# Patient Record
Sex: Female | Born: 1945
Health system: Southern US, Community
[De-identification: ages and names within clinical notes are randomized; demographics above are authoritative.]

## PROBLEM LIST (undated history)

## (undated) ENCOUNTER — Emergency Department: Payer: PPO | Source: Home / Self Care

## (undated) DIAGNOSIS — M5030 Other cervical disc degeneration, unspecified cervical region: Secondary | ICD-10-CM

## (undated) DIAGNOSIS — M47812 Spondylosis without myelopathy or radiculopathy, cervical region: Secondary | ICD-10-CM

## (undated) DIAGNOSIS — M359 Systemic involvement of connective tissue, unspecified: Secondary | ICD-10-CM

## (undated) DIAGNOSIS — E669 Obesity, unspecified: Secondary | ICD-10-CM

## (undated) DIAGNOSIS — M351 Other overlap syndromes: Secondary | ICD-10-CM

## (undated) DIAGNOSIS — E785 Hyperlipidemia, unspecified: Secondary | ICD-10-CM

## (undated) DIAGNOSIS — M51369 Other intervertebral disc degeneration, lumbar region without mention of lumbar back pain or lower extremity pain: Secondary | ICD-10-CM

## (undated) DIAGNOSIS — N840 Polyp of corpus uteri: Secondary | ICD-10-CM

## (undated) DIAGNOSIS — Z9889 Other specified postprocedural states: Secondary | ICD-10-CM

## (undated) DIAGNOSIS — R001 Bradycardia, unspecified: Secondary | ICD-10-CM

## (undated) DIAGNOSIS — E042 Nontoxic multinodular goiter: Secondary | ICD-10-CM

## (undated) DIAGNOSIS — T7840XA Allergy, unspecified, initial encounter: Secondary | ICD-10-CM

## (undated) DIAGNOSIS — M5136 Other intervertebral disc degeneration, lumbar region: Secondary | ICD-10-CM

## (undated) DIAGNOSIS — N200 Calculus of kidney: Secondary | ICD-10-CM

## (undated) DIAGNOSIS — U071 COVID-19: Secondary | ICD-10-CM

## (undated) DIAGNOSIS — L570 Actinic keratosis: Secondary | ICD-10-CM

## (undated) DIAGNOSIS — K219 Gastro-esophageal reflux disease without esophagitis: Secondary | ICD-10-CM

## (undated) DIAGNOSIS — M4800 Spinal stenosis, site unspecified: Secondary | ICD-10-CM

## (undated) DIAGNOSIS — R112 Nausea with vomiting, unspecified: Secondary | ICD-10-CM

## (undated) HISTORY — DX: Spinal stenosis, site unspecified: M48.00

## (undated) HISTORY — DX: Allergy, unspecified, initial encounter: T78.40XA

## (undated) HISTORY — DX: Calculus of kidney: N20.0

## (undated) HISTORY — PX: OTHER SURGICAL HISTORY: SHX169

## (undated) HISTORY — DX: Obesity, unspecified: E66.9

## (undated) HISTORY — PX: DILATION AND CURETTAGE OF UTERUS: SHX78

## (undated) HISTORY — DX: Other cervical disc degeneration, unspecified cervical region: M50.30

## (undated) HISTORY — DX: Other intervertebral disc degeneration, lumbar region: M51.36

## (undated) HISTORY — DX: Bradycardia, unspecified: R00.1

## (undated) HISTORY — DX: Actinic keratosis: L57.0

## (undated) HISTORY — DX: Polyp of corpus uteri: N84.0

## (undated) HISTORY — DX: Systemic involvement of connective tissue, unspecified: M35.9

## (undated) HISTORY — DX: COVID-19: U07.1

## (undated) HISTORY — PX: APPENDECTOMY: SHX54

## (undated) HISTORY — DX: Other intervertebral disc degeneration, lumbar region without mention of lumbar back pain or lower extremity pain: M51.369

## (undated) HISTORY — PX: COLONOSCOPY: SHX174

## (undated) HISTORY — PX: HAND SURGERY: SHX662

## (undated) HISTORY — PX: TUBAL LIGATION: SHX77

## (undated) HISTORY — DX: Hyperlipidemia, unspecified: E78.5

## (undated) HISTORY — DX: Spondylosis without myelopathy or radiculopathy, cervical region: M47.812

## (undated) HISTORY — PX: UPPER GASTROINTESTINAL ENDOSCOPY: SHX188

## (undated) HISTORY — PX: TONSILLECTOMY: SUR1361

---

## 1997-11-14 ENCOUNTER — Ambulatory Visit (HOSPITAL_COMMUNITY): Admission: RE | Admit: 1997-11-14 | Discharge: 1997-11-14 | Payer: Self-pay | Admitting: *Deleted

## 2000-01-28 ENCOUNTER — Other Ambulatory Visit: Admission: RE | Admit: 2000-01-28 | Discharge: 2000-01-28 | Payer: Self-pay | Admitting: Obstetrics and Gynecology

## 2001-01-30 ENCOUNTER — Other Ambulatory Visit: Admission: RE | Admit: 2001-01-30 | Discharge: 2001-01-30 | Payer: Self-pay | Admitting: Obstetrics and Gynecology

## 2002-11-03 ENCOUNTER — Encounter: Admission: RE | Admit: 2002-11-03 | Discharge: 2002-11-03 | Payer: Self-pay | Admitting: Family Medicine

## 2002-11-03 ENCOUNTER — Encounter: Payer: Self-pay | Admitting: Family Medicine

## 2003-03-16 ENCOUNTER — Other Ambulatory Visit: Admission: RE | Admit: 2003-03-16 | Discharge: 2003-03-16 | Payer: Self-pay | Admitting: Family Medicine

## 2003-05-11 ENCOUNTER — Encounter: Payer: Self-pay | Admitting: Internal Medicine

## 2004-04-21 ENCOUNTER — Encounter: Payer: Self-pay | Admitting: Family Medicine

## 2004-05-01 ENCOUNTER — Other Ambulatory Visit: Admission: RE | Admit: 2004-05-01 | Discharge: 2004-05-01 | Payer: Self-pay | Admitting: Obstetrics and Gynecology

## 2004-05-29 ENCOUNTER — Ambulatory Visit: Payer: Self-pay | Admitting: Family Medicine

## 2004-05-30 ENCOUNTER — Ambulatory Visit: Payer: Self-pay | Admitting: Family Medicine

## 2004-09-14 ENCOUNTER — Ambulatory Visit: Payer: Self-pay | Admitting: Family Medicine

## 2004-10-03 ENCOUNTER — Ambulatory Visit: Payer: Self-pay | Admitting: Family Medicine

## 2004-10-04 ENCOUNTER — Ambulatory Visit: Payer: Self-pay | Admitting: Family Medicine

## 2004-10-05 ENCOUNTER — Ambulatory Visit: Payer: Self-pay | Admitting: Family Medicine

## 2005-01-15 ENCOUNTER — Ambulatory Visit: Payer: Self-pay | Admitting: Family Medicine

## 2005-01-18 ENCOUNTER — Ambulatory Visit: Payer: Self-pay | Admitting: Internal Medicine

## 2005-01-19 HISTORY — PX: DILATION AND CURETTAGE OF UTERUS: SHX78

## 2005-02-22 ENCOUNTER — Ambulatory Visit: Payer: Self-pay | Admitting: Family Medicine

## 2005-03-22 ENCOUNTER — Ambulatory Visit: Payer: Self-pay | Admitting: Internal Medicine

## 2005-04-02 ENCOUNTER — Ambulatory Visit: Payer: Self-pay

## 2005-04-03 ENCOUNTER — Ambulatory Visit: Payer: Self-pay | Admitting: Family Medicine

## 2005-04-25 ENCOUNTER — Ambulatory Visit: Payer: Self-pay | Admitting: Internal Medicine

## 2005-05-11 ENCOUNTER — Encounter: Admission: RE | Admit: 2005-05-11 | Discharge: 2005-05-11 | Payer: Self-pay | Admitting: Family Medicine

## 2005-06-24 ENCOUNTER — Other Ambulatory Visit: Admission: RE | Admit: 2005-06-24 | Discharge: 2005-06-24 | Payer: Self-pay | Admitting: Obstetrics and Gynecology

## 2005-12-06 ENCOUNTER — Ambulatory Visit: Payer: Self-pay | Admitting: Internal Medicine

## 2006-01-03 ENCOUNTER — Ambulatory Visit: Payer: Self-pay | Admitting: Family Medicine

## 2006-02-19 ENCOUNTER — Ambulatory Visit: Payer: Self-pay | Admitting: Family Medicine

## 2006-02-21 ENCOUNTER — Encounter: Admission: RE | Admit: 2006-02-21 | Discharge: 2006-02-21 | Payer: Self-pay | Admitting: Family Medicine

## 2006-03-31 ENCOUNTER — Ambulatory Visit: Payer: Self-pay | Admitting: Family Medicine

## 2006-06-05 ENCOUNTER — Ambulatory Visit: Payer: Self-pay | Admitting: Family Medicine

## 2006-07-22 HISTORY — PX: ENDOMETRIAL BIOPSY: SHX622

## 2006-08-07 ENCOUNTER — Other Ambulatory Visit: Admission: RE | Admit: 2006-08-07 | Discharge: 2006-08-07 | Payer: Self-pay | Admitting: Obstetrics and Gynecology

## 2006-09-09 ENCOUNTER — Ambulatory Visit: Payer: Self-pay | Admitting: Family Medicine

## 2006-09-30 ENCOUNTER — Ambulatory Visit: Payer: Self-pay | Admitting: Family Medicine

## 2006-10-17 ENCOUNTER — Ambulatory Visit: Payer: Self-pay | Admitting: Family Medicine

## 2006-10-17 LAB — CONVERTED CEMR LAB
AST: 23 units/L (ref 0–37)
Eosinophils Relative: 2.3 % (ref 0.0–5.0)
HCT: 37.1 % (ref 36.0–46.0)
Hemoglobin: 12.8 g/dL (ref 12.0–15.0)
MCHC: 34.6 g/dL (ref 30.0–36.0)
Monocytes Absolute: 0.5 10*3/uL (ref 0.2–0.7)
Neutrophils Relative %: 57.3 % (ref 43.0–77.0)
RBC: 4.02 M/uL (ref 3.87–5.11)
RDW: 12.1 % (ref 11.5–14.6)
TSH: 0.92 microintl units/mL (ref 0.35–5.50)
WBC: 4.7 10*3/uL (ref 4.5–10.5)

## 2007-01-19 ENCOUNTER — Encounter: Payer: Self-pay | Admitting: Family Medicine

## 2007-01-19 DIAGNOSIS — M1990 Unspecified osteoarthritis, unspecified site: Secondary | ICD-10-CM | POA: Insufficient documentation

## 2007-01-19 DIAGNOSIS — G5600 Carpal tunnel syndrome, unspecified upper limb: Secondary | ICD-10-CM | POA: Insufficient documentation

## 2007-01-19 DIAGNOSIS — N951 Menopausal and female climacteric states: Secondary | ICD-10-CM | POA: Insufficient documentation

## 2007-01-19 DIAGNOSIS — M359 Systemic involvement of connective tissue, unspecified: Secondary | ICD-10-CM | POA: Insufficient documentation

## 2007-01-19 DIAGNOSIS — L259 Unspecified contact dermatitis, unspecified cause: Secondary | ICD-10-CM | POA: Insufficient documentation

## 2007-01-19 DIAGNOSIS — T7840XA Allergy, unspecified, initial encounter: Secondary | ICD-10-CM | POA: Insufficient documentation

## 2007-01-19 DIAGNOSIS — K589 Irritable bowel syndrome without diarrhea: Secondary | ICD-10-CM | POA: Insufficient documentation

## 2007-01-19 DIAGNOSIS — N318 Other neuromuscular dysfunction of bladder: Secondary | ICD-10-CM | POA: Insufficient documentation

## 2007-01-19 DIAGNOSIS — E785 Hyperlipidemia, unspecified: Secondary | ICD-10-CM | POA: Insufficient documentation

## 2007-01-28 ENCOUNTER — Ambulatory Visit: Payer: Self-pay | Admitting: Family Medicine

## 2007-01-28 DIAGNOSIS — L218 Other seborrheic dermatitis: Secondary | ICD-10-CM | POA: Insufficient documentation

## 2007-02-03 ENCOUNTER — Encounter: Payer: Self-pay | Admitting: Family Medicine

## 2007-02-04 ENCOUNTER — Encounter (INDEPENDENT_AMBULATORY_CARE_PROVIDER_SITE_OTHER): Payer: Self-pay | Admitting: *Deleted

## 2007-04-01 ENCOUNTER — Encounter: Payer: Self-pay | Admitting: Family Medicine

## 2007-07-23 LAB — CONVERTED CEMR LAB: Pap Smear: NORMAL

## 2007-08-06 ENCOUNTER — Encounter: Payer: Self-pay | Admitting: Family Medicine

## 2007-08-21 ENCOUNTER — Encounter: Payer: Self-pay | Admitting: Family Medicine

## 2007-08-27 ENCOUNTER — Other Ambulatory Visit: Admission: RE | Admit: 2007-08-27 | Discharge: 2007-08-27 | Payer: Self-pay | Admitting: Obstetrics and Gynecology

## 2007-09-07 ENCOUNTER — Ambulatory Visit: Payer: Self-pay | Admitting: Internal Medicine

## 2007-09-09 ENCOUNTER — Encounter: Payer: Self-pay | Admitting: Family Medicine

## 2007-09-18 ENCOUNTER — Encounter: Payer: Self-pay | Admitting: Family Medicine

## 2007-12-28 ENCOUNTER — Encounter: Payer: Self-pay | Admitting: Family Medicine

## 2008-01-06 ENCOUNTER — Encounter: Payer: Self-pay | Admitting: Family Medicine

## 2008-01-11 ENCOUNTER — Ambulatory Visit: Payer: Self-pay | Admitting: Family Medicine

## 2008-01-11 ENCOUNTER — Telehealth: Payer: Self-pay | Admitting: Internal Medicine

## 2008-01-11 DIAGNOSIS — K648 Other hemorrhoids: Secondary | ICD-10-CM | POA: Insufficient documentation

## 2008-01-14 ENCOUNTER — Telehealth: Payer: Self-pay | Admitting: Internal Medicine

## 2008-01-29 ENCOUNTER — Ambulatory Visit: Payer: Self-pay | Admitting: Family Medicine

## 2008-02-01 LAB — CONVERTED CEMR LAB
ALT: 21 units/L (ref 0–35)
AST: 20 units/L (ref 0–37)
Albumin: 4.1 g/dL (ref 3.5–5.2)
Basophils Relative: 0.2 % (ref 0.0–1.0)
Bilirubin, Direct: 0.1 mg/dL (ref 0.0–0.3)
Calcium: 9.4 mg/dL (ref 8.4–10.5)
Chloride: 105 meq/L (ref 96–112)
Cholesterol: 213 mg/dL (ref 0–200)
Direct LDL: 144.5 mg/dL
Eosinophils Absolute: 0.2 10*3/uL (ref 0.0–0.7)
GFR calc non Af Amer: 90 mL/min
Lymphocytes Relative: 31.2 % (ref 12.0–46.0)
MCHC: 34.7 g/dL (ref 30.0–36.0)
Monocytes Relative: 12.7 % — ABNORMAL HIGH (ref 3.0–12.0)
Neutro Abs: 2.7 10*3/uL (ref 1.4–7.7)
Neutrophils Relative %: 52.6 % (ref 43.0–77.0)
Phosphorus: 3.8 mg/dL (ref 2.3–4.6)
Platelets: 225 10*3/uL (ref 150–400)
Potassium: 4 meq/L (ref 3.5–5.1)
RBC: 4.28 M/uL (ref 3.87–5.11)
RDW: 12.4 % (ref 11.5–14.6)
TSH: 1.12 microintl units/mL (ref 0.35–5.50)
Total CHOL/HDL Ratio: 4.3
Triglycerides: 108 mg/dL (ref 0–149)
VLDL: 22 mg/dL (ref 0–40)
WBC: 5.2 10*3/uL (ref 4.5–10.5)

## 2008-02-02 ENCOUNTER — Encounter: Payer: Self-pay | Admitting: Family Medicine

## 2008-02-05 ENCOUNTER — Encounter (INDEPENDENT_AMBULATORY_CARE_PROVIDER_SITE_OTHER): Payer: Self-pay | Admitting: *Deleted

## 2008-02-18 ENCOUNTER — Ambulatory Visit: Payer: Self-pay | Admitting: Family Medicine

## 2008-02-22 LAB — CONVERTED CEMR LAB
Basophils Absolute: 0 10*3/uL (ref 0.0–0.1)
Eosinophils Absolute: 0.1 10*3/uL (ref 0.0–0.7)
Lymphocytes Relative: 31.8 % (ref 12.0–46.0)
MCHC: 35 g/dL (ref 30.0–36.0)
MCV: 91.5 fL (ref 78.0–100.0)
Monocytes Absolute: 0.5 10*3/uL (ref 0.1–1.0)
Neutrophils Relative %: 52.8 % (ref 43.0–77.0)
RBC: 4.28 M/uL (ref 3.87–5.11)
WBC: 4.1 10*3/uL — ABNORMAL LOW (ref 4.5–10.5)

## 2008-04-01 ENCOUNTER — Telehealth (INDEPENDENT_AMBULATORY_CARE_PROVIDER_SITE_OTHER): Payer: Self-pay | Admitting: *Deleted

## 2008-04-26 DIAGNOSIS — Z860100 Personal history of colon polyps, unspecified: Secondary | ICD-10-CM | POA: Insufficient documentation

## 2008-04-26 DIAGNOSIS — K5730 Diverticulosis of large intestine without perforation or abscess without bleeding: Secondary | ICD-10-CM | POA: Insufficient documentation

## 2008-04-26 DIAGNOSIS — Z8619 Personal history of other infectious and parasitic diseases: Secondary | ICD-10-CM | POA: Insufficient documentation

## 2008-05-03 ENCOUNTER — Ambulatory Visit: Payer: Self-pay | Admitting: Internal Medicine

## 2008-05-03 DIAGNOSIS — K219 Gastro-esophageal reflux disease without esophagitis: Secondary | ICD-10-CM | POA: Insufficient documentation

## 2008-05-03 DIAGNOSIS — R1013 Epigastric pain: Secondary | ICD-10-CM | POA: Insufficient documentation

## 2008-05-03 DIAGNOSIS — K3189 Other diseases of stomach and duodenum: Secondary | ICD-10-CM | POA: Insufficient documentation

## 2008-05-04 ENCOUNTER — Ambulatory Visit: Payer: Self-pay | Admitting: Internal Medicine

## 2008-05-04 LAB — CONVERTED CEMR LAB: UREASE: NEGATIVE

## 2008-05-09 ENCOUNTER — Ambulatory Visit (HOSPITAL_COMMUNITY): Admission: RE | Admit: 2008-05-09 | Discharge: 2008-05-09 | Payer: Self-pay | Admitting: Internal Medicine

## 2008-05-11 ENCOUNTER — Telehealth: Payer: Self-pay | Admitting: Internal Medicine

## 2008-05-31 ENCOUNTER — Ambulatory Visit: Payer: Self-pay | Admitting: Family Medicine

## 2008-05-31 DIAGNOSIS — I498 Other specified cardiac arrhythmias: Secondary | ICD-10-CM | POA: Insufficient documentation

## 2008-06-10 ENCOUNTER — Ambulatory Visit: Payer: Self-pay | Admitting: Internal Medicine

## 2008-12-30 ENCOUNTER — Ambulatory Visit: Payer: Self-pay | Admitting: Family Medicine

## 2009-01-11 ENCOUNTER — Encounter: Payer: Self-pay | Admitting: Family Medicine

## 2009-02-07 ENCOUNTER — Encounter: Payer: Self-pay | Admitting: Family Medicine

## 2009-02-10 ENCOUNTER — Encounter (INDEPENDENT_AMBULATORY_CARE_PROVIDER_SITE_OTHER): Payer: Self-pay | Admitting: *Deleted

## 2009-02-22 ENCOUNTER — Observation Stay (HOSPITAL_COMMUNITY): Admission: EM | Admit: 2009-02-22 | Discharge: 2009-02-22 | Payer: Self-pay | Admitting: Emergency Medicine

## 2009-05-24 ENCOUNTER — Encounter: Payer: Self-pay | Admitting: Family Medicine

## 2009-07-11 ENCOUNTER — Ambulatory Visit: Payer: Self-pay | Admitting: Family Medicine

## 2009-07-13 ENCOUNTER — Telehealth: Payer: Self-pay | Admitting: Family Medicine

## 2009-08-15 ENCOUNTER — Other Ambulatory Visit: Admission: RE | Admit: 2009-08-15 | Discharge: 2009-08-15 | Payer: Self-pay | Admitting: Obstetrics and Gynecology

## 2009-10-04 ENCOUNTER — Telehealth: Payer: Self-pay | Admitting: Internal Medicine

## 2010-01-08 ENCOUNTER — Encounter: Payer: Self-pay | Admitting: Family Medicine

## 2010-03-28 ENCOUNTER — Encounter (INDEPENDENT_AMBULATORY_CARE_PROVIDER_SITE_OTHER): Payer: Self-pay | Admitting: *Deleted

## 2010-04-06 ENCOUNTER — Telehealth: Payer: Self-pay | Admitting: Internal Medicine

## 2010-04-18 ENCOUNTER — Ambulatory Visit: Payer: Self-pay | Admitting: Family Medicine

## 2010-04-18 DIAGNOSIS — E039 Hypothyroidism, unspecified: Secondary | ICD-10-CM | POA: Insufficient documentation

## 2010-04-19 ENCOUNTER — Telehealth: Payer: Self-pay | Admitting: Family Medicine

## 2010-05-02 ENCOUNTER — Ambulatory Visit: Payer: Self-pay | Admitting: Family Medicine

## 2010-05-06 LAB — CONVERTED CEMR LAB
Free T4: 0.41 ng/dL — ABNORMAL LOW (ref 0.60–1.60)
TSH: 0.16 microintl units/mL — ABNORMAL LOW (ref 0.35–5.50)

## 2010-05-11 ENCOUNTER — Ambulatory Visit: Payer: Self-pay | Admitting: Family Medicine

## 2010-05-11 DIAGNOSIS — R892 Abnormal level of other drugs, medicaments and biological substances in specimens from other organs, systems and tissues: Secondary | ICD-10-CM | POA: Insufficient documentation

## 2010-05-22 ENCOUNTER — Encounter: Payer: Self-pay | Admitting: Family Medicine

## 2010-05-24 ENCOUNTER — Encounter: Payer: Self-pay | Admitting: Family Medicine

## 2010-06-08 ENCOUNTER — Telehealth: Payer: Self-pay | Admitting: Family Medicine

## 2010-06-08 DIAGNOSIS — H9319 Tinnitus, unspecified ear: Secondary | ICD-10-CM | POA: Insufficient documentation

## 2010-06-13 ENCOUNTER — Encounter: Payer: Self-pay | Admitting: Family Medicine

## 2010-06-28 ENCOUNTER — Encounter: Payer: Self-pay | Admitting: Family Medicine

## 2010-06-28 LAB — HM MAMMOGRAPHY: HM Mammogram: NORMAL

## 2010-07-02 ENCOUNTER — Encounter (INDEPENDENT_AMBULATORY_CARE_PROVIDER_SITE_OTHER): Payer: Self-pay | Admitting: *Deleted

## 2010-07-02 ENCOUNTER — Encounter: Payer: Self-pay | Admitting: Family Medicine

## 2010-08-01 ENCOUNTER — Ambulatory Visit
Admission: RE | Admit: 2010-08-01 | Discharge: 2010-08-01 | Payer: Self-pay | Source: Home / Self Care | Attending: Family Medicine | Admitting: Family Medicine

## 2010-08-09 ENCOUNTER — Other Ambulatory Visit: Payer: Self-pay | Admitting: Family Medicine

## 2010-08-09 ENCOUNTER — Ambulatory Visit
Admission: RE | Admit: 2010-08-09 | Discharge: 2010-08-09 | Payer: Self-pay | Source: Home / Self Care | Attending: Family Medicine | Admitting: Family Medicine

## 2010-08-09 LAB — LIPID PANEL
Cholesterol: 224 mg/dL — ABNORMAL HIGH (ref 0–200)
HDL: 47.4 mg/dL (ref >39.00)
Total CHOL/HDL Ratio: 5
Triglycerides: 66 mg/dL (ref 0.0–149.0)
VLDL: 13.2 mg/dL (ref 0.0–40.0)

## 2010-08-09 LAB — LDL CHOLESTEROL, DIRECT: Direct LDL: 166.4 mg/dL

## 2010-08-09 LAB — ALT: ALT: 17 U/L (ref 0–35)

## 2010-08-09 LAB — AST: AST: 19 U/L (ref 0–37)

## 2010-08-15 ENCOUNTER — Encounter: Payer: Self-pay | Admitting: Family Medicine

## 2010-08-15 ENCOUNTER — Ambulatory Visit
Admission: RE | Admit: 2010-08-15 | Discharge: 2010-08-15 | Payer: Self-pay | Source: Home / Self Care | Attending: Family Medicine | Admitting: Family Medicine

## 2010-08-21 NOTE — Letter (Signed)
Summary: Colonoscopy Letter  [facility]  [address] [place], [place] [address]   Phone: [phone]  Fax: [phone]      March 28, 2010 MRN: [id number]   Morgan [name] [address] [place], [place]  [address]   Dear [name],   According to your medical record, it is time for you to schedule a Colonoscopy. The American Cancer Society recommends this procedure as a method to detect early colon cancer. Patients with a family history of colon cancer, or a personal history of colon polyps or inflammatory bowel disease are at increased risk.  This letter has beeen generated based on the recommendations made at the time of your procedure. If you feel that in your particular situation this may no longer apply, please contact our office.  Please call our office at [phone] to schedule this appointment or to update your records at your earliest convenience.  Thank you for cooperating with [place] to provide you with the very best care possible.   Sincerely,  Hedwig Morton. Juanda Chance, M.D.  [facility] Gastroenterology Division [phone]

## 2010-08-21 NOTE — Consult Note (Addendum)
Summary: [facility]= endocrine  [facility]   Imported By: Maryln Gottron 05/28/2010 15:46:44  _____________________________________________________________________  External Attachment:    Type:   Image     Comment:   External Document

## 2010-08-21 NOTE — Progress Notes (Signed)
Summary: loud noise in ears   Phone Note Call from Patient Call back at Home Phone [phone]   Caller: Patient Call For: Judith Part MD Summary of Call: Patient states that she has been having a very unusual noise when she is lying down at night and there are no other noises around. She explained it like a ???piolet light. She says that it is not a ringing in her ears, but it is loud enough that it keeps her from sleeping soundly because she is hearing this nois, she doesn't hear it at all during the day. She is asking if she should be seen by an ENT and if so who you reccomend. Please advise.  Initial call taken by: Melody Comas,  June 08, 2010 9:24 AM  Follow-up for Phone Call        yes I do recommend ENT is likely a condition called tinnitus -- and sometimes is treatable , sometimes not I will do ref for [facility] Follow-up by: Judith Part MD,  June 08, 2010 11:36 AM  Additional Follow-up for Phone Call Additional follow up Details #1::        Patient notified as instructed by telephone. Pt said she would like to go to ENT in [place]; whoever Dr Milinda Antis would recommend.Pt  will wait to hear from [facility] and pt can be reached at [phone].Lewanda Rife LPN  June 08, 2010 12:04 PM   New Problems: TINNITUS (ICD-388.30)   New Problems: TINNITUS (ICD-388.30)

## 2010-08-21 NOTE — Progress Notes (Signed)
Summary: ? about taking magnesium   Phone Note Call from Patient Call back at Home Phone [phone]   Caller: Patient Call For: DR.Shylynn Bruning Summary of Call: patient having some trouble with constipation she would like to know if it is ok to take magnesium 200mg  tabs once a day to help her have a BM. ok to leave a message on her machine if she is not there.  Initial call taken by: Harlow Mares CMA Duncan Dull),  October 04, 2009 8:44 AM  Follow-up for Phone Call        DR.Murtaza Shell PLEASE ADVISE  Follow-up by: Laureen Ochs LPN,  October 04, 2009 9:17 AM  Additional Follow-up for Phone Call Additional follow up Details #1::        Yes, she may take 400mg  Mg2+  ( 2 tablets) daily as needed for constipation Additional Follow-up by: Hart Carwin MD,  October 04, 2009 4:26 PM    Additional Follow-up for Phone Call Additional follow up Details #2::    .Message left for pt. with above MD instructions. Pt. instructed to call back as needed.  Follow-up by: Laureen Ochs LPN,  October 04, 2009 4:38 PM

## 2010-08-21 NOTE — Progress Notes (Signed)
Summary: Update on pt's meds  Phone Note Call from Patient   Caller: Patient Call For: Judith Part MD Summary of Call: Pt was to call with medications she is taking that she did not know the name of at her last visit. 1) Ultra  probiotic balance 100 billion cfu- taking one tablet three times a week. 2) Betaine plus taking one tablet before each meal and 3) Niatain taking one tablet daily. 4) ASA 81mg  taking one tablet daily.  I will add these meds to pt's med list. Initial call taken by: Lewanda Rife LPN,  April 19, 2010 10:59 AM  Follow-up for Phone Call        thanks - I will try to look into what these are  Follow-up by: Judith Part MD,  April 19, 2010 12:17 PM  Additional Follow-up for Phone Call Additional follow up Details #1::        the probiotic is probably fine  iodine is probably for thyroid I don't know what niatain is - have her ask her herbalist and will disc all at her f/u please add these to her med list Additional Follow-up by: Judith Part MD,  April 20, 2010 8:13 AM    Additional Follow-up for Phone Call Additional follow up Details #2::    Patient notified as instructed by telephone. Pt will get info from herbalist.Rena [facility] LPN  April 20, 2010 10:51 AM   New/Updated Medications: ARMOUR THYROID 120 MG TABS (THYROID) Take 1 tablet by mouth once a day

## 2010-08-21 NOTE — Assessment & Plan Note (Signed)
Summary: DULL ACHE FROM BREAST TO STOMACH/CE   Vital Signs:  Patient profile:   65 year old female Height:      65.25 inches Weight:      183 pounds BMI:     30.33 Temp:     97.9 degrees F oral Pulse rate:   64 / minute Pulse rhythm:   regular BP sitting:   100 / 60  (left arm) Cuff size:   regular  Vitals Entered By: Lewanda Rife LPN (April 18, 2010 3:23 PM) CC: dull ache in upper abdomen and lower stomach bothers her   History of Present Illness: had a dull ache  in epigastric area all night long  this am felt poorly - tired - went back to bed  eventually did feel better now feels like a rock in stomach -- like overly full and indigestion it was constant  no burning  has always had gas and belching - no worse than usual  last night ate roast brown rice and gravy / beans and corn - small portion  no bread     has hx of GERD/ functional dyspepsia and HH in past  last EGD was in 09 showed minimal gastritis  04 colonosc with tics and polyps- due for one   sees Dr Juanda Chance- is due for colonosc   used to be on protonix - has not taken for a long time   is taking a probiotic - ? which one  does take baby aspirin  is taking 3 drugs / herbal - to help digestion and hair  taking these for belching -- and thinks they are helping   sees herbal pharmacist / np -- Tammy Whirl - at [facility]   is on armour thyroid -- for ? borderline thyroid      Allergies: 1)  Codeine 2)  Penicillin 3)  Vicodin 4)  * Beconase 5)  * Rhinocort 6)  Claritin 7)  * Climara Patch  Past History:  Past Surgical History: Last updated: 01-Jun-2008 Tonsillectomy Dexa- normal (12/1998)-   ok (05/2006) Exercise stress test- neg (05/2000) Appendectomy Tubal ligation Colonoscopy- polyps, diverticulitis (04/2003) D & C- fibroids Abd [place]- neg (09/2004) D & C- endometrial polyp (01/2005) Stress myoview- neg EF 69% (03/2005) NCV/EMG- neg (03/2006) Endo biopsy- neg per pt (2008) Abd [place]- neg  (05/1997) EGD nl with [facility] 10/09  Family History: Last updated: 06-01-2008 Father: CAD, MI (died at 36) Mother:  Siblings:  GM DM cousins- DM No FH of Colon Cancer:  Social History: Last updated: 01/29/2008 Marital Status: Married Children:  Occupation:  non smoker no alcohol   Risk Factors: Smoking Status: never (01/19/2007)  Past Medical History: Hyperlipidemia Osteoarthritis- neck connective tissue dz deg disk/spinal stenosis-- CS , LS  obesity  endometrial ? polyp-- not resolved with Dand C-- following  bradycardia   hand sx-- Dr Teressa Senter  GI--Dr Juanda Chance  rheum-- Dr Janetta Hora  cardiol Tenny Craw  sees ? herbalist at [facility] (nurse practitioner)  Review of Systems General:  Denies chills, fatigue, fever, and malaise. Eyes:  Denies blurring and eye irritation. CV:  Denies chest pain or discomfort, palpitations, and shortness of breath with exertion. Resp:  Denies cough, shortness of breath, and wheezing. GI:  Complains of abdominal pain, gas, and indigestion; denies bloody stools, change in bowel habits, nausea, and vomiting. GU:  Denies abnormal vaginal bleeding, discharge, dysuria, and urinary frequency. MS:  Denies joint pain. Derm:  Denies itching, lesion(s), poor wound healing, and rash. Neuro:  Denies  numbness and tingling. Psych:  some stress . Endo:  Denies cold intolerance, excessive thirst, excessive urination, and heat intolerance. Heme:  Denies abnormal bruising and bleeding.  Physical Exam  General:  overweight but generally well appearing  Head:  normocephalic, atraumatic, and no abnormalities observed.   Eyes:  vision grossly intact, pupils equal, pupils round, and pupils reactive to light.  no conjunctival pallor, injection or icterus  Mouth:  pharynx pink and moist.   Neck:  supple with full rom and no masses or thyromegally, no JVD or carotid bruit  Chest Wall:  No deformities, masses, or tenderness noted. Lungs:  Normal respiratory effort,  chest expands symmetrically. Lungs are clear to auscultation, no crackles or wheezes. Heart:  Normal rate and regular rhythm. S1 and S2 normal without gallop, murmur, click, rub or other extra sounds. Abdomen:  mild epigastric tenderness without rebound or gaurding nl bs 4 Q Msk:  No deformity or scoliosis noted of thoracic or lumbar spine.   Extremities:  No clubbing, cyanosis, edema, or deformity noted with normal full range of motion of all joints.   Neurologic:  sensation intact to light touch, gait normal, and DTRs symmetrical and normal.   Skin:  Intact without suspicious lesions or rashes Cervical Nodes:  No lymphadenopathy noted Inguinal Nodes:  No significant adenopathy Psych:  normal affect, talkative and pleasant    Impression & Recommendations:  Problem # 1:  DYSPEPSIA (ICD-536.8)  I recommend re starting protonix for now  keep working on wt loss send me the names of herbs you are taking  f/u after lab in 2 weeks  in addn pt thinks she had lead poisoining? -- saw herbalist and had chealation tx .... really unsure  will bring labs to next visit   Orders: Prescription Created Electronically [phone])  Problem # 2:  HYPOTHYROIDISM (ICD-244.9) Assessment: New  pt is on armour thyroid but is unsure if she is hypothyroid  will hold it 2 wk and then do thyroid prof and f/u  Her updated medication list for this problem includes:    Armour Thyroid 120 Mg Tabs (Thyroid) .Marland Kitchen... Take 1 tablet by mouth once a day  Orders: Prescription Created Electronically [id number])  Complete Medication List: 1)  Adult Aspirin Low Strength 81 Mg Tbdp (Aspirin) .... Take one by mouth as directed 2)  Acetaminophen 325 Mg Tabs (Acetaminophen) 3)  Progesterone 200mg   .... One by mouth daily 4)  Armour Thyroid 120 Mg Tabs (Thyroid) .... Take 1 tablet by mouth once a day 5)  Vivelle-dot 0.1 Mg/24hr Pttw (Estradiol) .... Change patch twice weekly (alternating with .075 patch) 6)  Vivelle-dot 0.075  Mg/24hr Pttw (Estradiol) .... Change patch twice weekly alternating with the .1 patch. 7)  Liquid Vitamin D 10000iu  .... One ml by mouth once weekly 8)  Magnesium ?mg  .... Take 1 tablet by mouth once a day 9)  Protonix 40 Mg Tbec (Pantoprazole sodium) .Marland Kitchen.. 1 by mouth once daily 10)  Ultra Probiotic Balance 100 Billion Cfu  .... One tablet three times a week 11)  Betaine Plus  .... One tablet by mouth before each meal. 12)  Niatain  .... Take 1 tablet by mouth once a day  Patient Instructions: 1)  call Dr Juanda Chance to see if colonoscopy is due -- depends if the polyps were adenomatous or hyperplastic  2)  I think you need to know what she recommends before you decide what to do  3)  remember that herbs and vitamins are  drugs that need to be monitored  4)  please call me back with the names of the herbs and vitamins that you take  5)  the current recommendation for calcium intake is 1200-1500 mg daily with 1000 IU of vitamin D  6)  hold your armour thyroid  7)  schedule labs for thyroid profile in 2 weeks tsh/ free T4 / T3 free/ T3 uptake  8)  then follow up for 30 minute visit  9)  start back on protonix -- I will send it to your pharmacy  Prescriptions: PROTONIX 40 MG TBEC (PANTOPRAZOLE SODIUM) 1 by mouth once daily  #30 x 3   Entered and Authorized by:   Judith Part MD   Signed by:   Judith Part MD on 04/18/2010   Method used:   Electronically to        [facility]* (retail)       [address]       [place], [place]  [address]       Ph: [phone]       Fax: [phone]   RxID:   [id number]   Current Allergies (reviewed today): CODEINE PENICILLIN VICODIN * BECONASE * RHINOCORT [place] PATCH

## 2010-08-21 NOTE — Assessment & Plan Note (Signed)
Summary: F/U DLO   Vital Signs:  Patient profile:   65 year old female Height:      65.25 inches Weight:      183.75 pounds BMI:     30.45 Temp:     98.2 degrees F oral Pulse rate:   60 / minute Pulse rhythm:   regular BP sitting:   118 / 74  (left arm) Cuff size:   regular  Vitals Entered By: Lewanda Rife LPN (Jun 09, 2010 12:04 PM) CC: follow-up visit   History of Present Illness: here for f/u of gastritis and thyroid problem  on protonix-- and abd symptoms are gone  wants to stay on this   thyroid labs came back abn with low tsh and low T4 and T3 with nl T3 uptake  no goiter that she knows of - cannot feel any  gmother had a goiter    herbs on probiotic -- wanted to make her hair  on betadyne plus for thyroid   has appt to see derm for hair loss in july   had heavy metal test high lead - and had iv chealation   Allergies: 1)  Codeine 2)  Penicillin 3)  Vicodin 4)  * Beconase 5)  * Rhinocort 6)  Claritin 7)  * Climara Patch  Past History:  Past Medical History: Last updated: 04/18/2010 Hyperlipidemia Osteoarthritis- neck connective tissue dz deg disk/spinal stenosis-- CS , LS  obesity  endometrial ? polyp-- not resolved with Dand C-- following  bradycardia   hand sx-- Dr Teressa Senter  GI--Dr Juanda Chance  rheum-- Dr Janetta Hora  cardiol Tenny Craw  sees ? herbalist at [facility] (nurse practitioner)  Past Surgical History: Last updated: 05/31/2008 Tonsillectomy Dexa- normal (12/1998)-   ok (05/2006) Exercise stress test- neg (05/2000) Appendectomy Tubal ligation Colonoscopy- polyps, diverticulitis (04/2003) D & C- fibroids Abd [place]- neg (09/2004) D & C- endometrial polyp (01/2005) Stress myoview- neg EF 69% (03/2005) NCV/EMG- neg (03/2006) Endo biopsy- neg per pt (2008) Abd [place]- neg (05/1997) EGD nl with [facility] 10/09  Family History: Last updated: 06-09-2010 Father: CAD, MI (died at 70) Mother:  Siblings:  GM DM cousins- DM No FH of Colon  Cancer: gm with goiter  grandson dying of a very rare cancer at age 62  Social History: Last updated: 01/29/2008 Marital Status: Married Children:  Occupation:  non smoker no alcohol   Risk Factors: Smoking Status: never (01/19/2007)  Family History: Father: CAD, MI (died at 44) Mother:  Siblings:  GM DM cousins- DM No FH of Colon Cancer: gm with goiter  grandson dying of a very rare cancer at age 55  Review of Systems General:  Complains of fatigue; denies loss of appetite and malaise; for the most part feels good . Eyes:  Denies blurring and eye pain. ENT:  Denies sinus pressure and sore throat. CV:  Denies chest pain or discomfort, palpitations, and shortness of breath with exertion. Resp:  Denies cough, shortness of breath, and wheezing. GI:  Denies abdominal pain, change in bowel habits, constipation, indigestion, nausea, and vomiting. GU:  Denies hematuria and urinary frequency. MS:  Denies joint pain, muscle aches, and cramps. Derm:  Complains of hair loss; denies itching, lesion(s), poor wound healing, and rash. Neuro:  Denies headaches, numbness, tingling, and weakness. Psych:  mood is ok . Endo:  Denies cold intolerance, excessive thirst, excessive urination, heat intolerance, and polyuria. Heme:  Denies abnormal bruising, bleeding, and enlarge lymph nodes.  Physical Exam  General:  overweight but generally  well appearing  Head:  normocephalic, atraumatic, and no abnormalities observed.   Eyes:  vision grossly intact, pupils equal, pupils round, and pupils reactive to light.  no conjunctival pallor, injection or icterus  Nose:  no nasal discharge.   Mouth:  pharynx pink and moist.   Neck:  supple with full rom and no masses or thyromegally, no JVD or carotid bruit  Chest Wall:  No deformities, masses, or tenderness noted. Lungs:  Normal respiratory effort, chest expands symmetrically. Lungs are clear to auscultation, no crackles or wheezes. Heart:  Normal  rate and regular rhythm. S1 and S2 normal without gallop, murmur, click, rub or other extra sounds. Abdomen:  Bowel sounds positive,abdomen soft and non-tender without masses, organomegaly or hernias noted. Msk:  No deformity or scoliosis noted of thoracic or lumbar spine.  no acute joint changes  Pulses:  R and L carotid,radial,femoral,dorsalis pedis and posterior tibial pulses are full and equal bilaterally Extremities:  No clubbing, cyanosis, edema, or deformity noted with normal full range of motion of all joints.   Neurologic:  sensation intact to light touch, gait normal, and DTRs symmetrical and normal.   Skin:  Intact without suspicious lesions or rashes Cervical Nodes:  No lymphadenopathy noted Inguinal Nodes:  No significant adenopathy Psych:  normal affect, talkative and pleasant    Impression & Recommendations:  Problem # 1:  HYPOTHYROIDISM (ICD-244.9) Assessment Deteriorated now with low tsh but also low T4 and T3 -- (stopped armour thyroid 2 wk ago) pt concerned mainly with hair loss also has high chol  will stop herbs ref to endo  pt also has questions about heavy metals -- will take time to review her labs from herbalist  Her updated medication list for this problem includes:    Armour Thyroid 120 Mg Tabs (Thyroid) .Marland Kitchen... Take 1 tablet by mouth once a day  Orders: Endocrinology Referral (Endocrine)  Problem # 2:  GASTRITIS (ICD-535.50) Assessment: Improved resolved on protonix  I rec she continue this and keep working on healthy diet and wt loss  Her updated medication list for this problem includes:    Protonix 40 Mg Tbec (Pantoprazole sodium) .Marland Kitchen... 1 by mouth once daily  Problem # 3:  HYPERLIPIDEMIA (ICD-272.4) Assessment: Unchanged  will re check this in 3 mo after thyroid is checked out  adv to continue low dose niacin if she wants to  rev low sat fat diet and exercise lab 3 mo and t/u  Labs Reviewed: SGOT: 20 (01/29/2008)   SGPT: 21 (01/29/2008)    HDL:50.0 (01/29/2008), 55.2 (10/17/2006)  LDL:DEL (01/29/2008), DEL (10/17/2006)  Chol:213 (01/29/2008), 230 (10/17/2006)  Trig:108 (01/29/2008), 151 (10/17/2006)  Problem # 4:  NONSPECIFIC ABNORMAL TOXICOLOGICAL FINDINGS (ICD-796.0) Assessment: New per pt - high heavy metals - and underwent chealation for lead  will scan her results and review ? any lead exposure   Complete Medication List: 1)  Adult Aspirin Low Strength 81 Mg Tbdp (Aspirin) .... Take one by mouth as directed 2)  Progesterone 200mg   .... One by mouth daily 3)  Armour Thyroid 120 Mg Tabs (Thyroid) .... Take 1 tablet by mouth once a day 4)  Vivelle-dot 0.1 Mg/24hr Pttw (Estradiol) .... Change patch twice weekly (alternating with .075 patch) 5)  Vivelle-dot 0.075 Mg/24hr Pttw (Estradiol) .... Change patch twice weekly alternating with the .1 patch. 6)  Liquid Vitamin D 10000iu  .... One ml by mouth once weekly 7)  Magnesium ?mg  .... Take 1 tablet by mouth once a day 8)  Protonix 40 Mg Tbec (Pantoprazole sodium) .Marland Kitchen.. 1 by mouth once daily 9)  Ultra Probiotic Balance 100 Billion Cfu  .... One tablet three times a week 10)  Betaine Plus  .... One tablet by mouth before each meal. 11)  Niatain  .... Take 1 tablet by mouth once a day  Patient Instructions: 1)  stop the probiotic unless it is helping you 2)  the niacin may help cholesterol  3)  stop your current vitaminD and take 1000 international units per day to keep bones healthy  4)  also you need to take calcium 1200-1500 mg per day  5)  we will do endocrinology referral at check out  6)  schedule fasting labs in 3 months lipid/ast/alt 272 and then follow up    Orders Added: 1)  Endocrinology Referral [Endocrine] 2)  Est. Patient Level IV [29562]    Current Allergies (reviewed today): CODEINE PENICILLIN VICODIN * BECONASE * RHINOCORT CLARITIN [facility] PATCH

## 2010-08-21 NOTE — Assessment & Plan Note (Signed)
Summary: CPX/CLE   Vital Signs:  Patient Profile:   65 Years Old Female Height:     65.25 inches Weight:      197 pounds Temp:     97.9 degrees F oral Pulse rate:   60 / minute Pulse rhythm:   regular BP sitting:   128 / 82  (left arm) Cuff size:   large  Vitals Entered By: Sydell Axon (January 29, 2008 10:57 AM)                 Chief Complaint:  30 minute checkup.  History of Present Illness: has a lot of things going on now  CT disease is under control with plaquenil-- is on a small dose  needs sx for trigger finger has chronic joint pain and back pain (with disc disease and spinal stenosis LS and cervical)-- sees DR Jeral Fruit  a lot of gas lately -- a lot of burping -- no acid or bitter taste  worse when she lies down at night pre - dated the plaquenil   wt is up 4 lb from last year has not been exercising-- too much pain  is getting shorter --used to be 5 ft 6 inches in past   is on femhrt from gyn-- is helping her with sleeping-- still having hot flashes gyn visit was in january  tried to get off them 6 mo  had polyp -- could not get it all with Dand C -- said if grows will get hyst   colonosc 2004- due in 7 years has had  a rectal fissure since then-- saw Billie-- using suppositories  had mam set up this month  has a skin tag-- on rectum-- also took antibiotic is taking miralax on a schedule -- to keep stools soft   is overdue for cholesterol check has not changed her diet for high cholesterol last LDL  over 150            Current Allergies (reviewed today): CODEINE PENICILLIN VICODIN * BECONASE * RHINOCORT CLARITIN * CLIMARA PATCH  Past Medical History:    Hyperlipidemia    Osteoarthritis- neck    connective tissue dz    deg disk/spinal stenosis-- CS , LS     obesity     endometrial ? polyp-- not resolved with Dand C-- following   Past Surgical History:    Reviewed history from 01/19/2007 and no changes required:       Dexa- normal  (12/1998)-   ok (05/2006)       Exercise stress test- neg (05/2000)       Appendectomy       Tubal ligation       Colonoscopy- polyps, diverticulitis (04/2003)       D & C- fibroids       Abd [place]- neg (09/2004)       D & C- endometrial polyp (01/2005)       Stress myoview- neg EF 69% (03/2005)       NCV/EMG- neg (03/2006)       Endo biopsy- neg per pt (2008)       Abd [place]- neg (05/1997)   Family History:    Reviewed history from 01/28/2007 and no changes required:       Father: CAD, MI       Mother:        Siblings:        GM DM       cousins- DM  Social History:  Marital Status: Married    Children:     Occupation:     non smoker    no alcohol    Risk Factors:  PAP Smear History:     Date of Last PAP Smear:  07/23/2007    Results:  normal    Review of Systems  General      Complains of fatigue.      Denies loss of appetite, malaise, and weight loss.  Eyes      Denies blurring and eye pain.  ENT      Denies hoarseness, sinus pressure, and sore throat.  CV      Denies chest pain or discomfort, palpitations, shortness of breath with exertion, and swelling of feet.  Resp      Denies cough and shortness of breath.  GI      Complains of gas.      Denies bloody stools, change in bowel habits, nausea, and vomiting.  GU      Denies abnormal vaginal bleeding and dysuria.  MS      Complains of joint pain and stiffness.      Denies joint redness and joint swelling.  Derm      Denies changes in color of skin, itching, and rash.  Neuro      Denies numbness and tingling.  Psych      gets down about her health problems- but overall ok   Endo      Denies excessive thirst and excessive urination.  Heme      Denies abnormal bruising and bleeding.   Physical Exam  General:     .overw  Head:     normocephalic, atraumatic, and no abnormalities observed.   Eyes:     vision grossly intact, pupils equal, pupils round, and pupils reactive to light.     Ears:     R ear normal and L ear normal.   Nose:     no nasal discharge.   Mouth:     pharynx pink and moist.   Neck:     supple with full rom and no masses or thyromegally, no JVD or carotid bruit  Chest Wall:     No deformities, masses, or tenderness noted. Lungs:     Normal respiratory effort, chest expands symmetrically. Lungs are clear to auscultation, no crackles or wheezes. Heart:     Normal rate and regular rhythm. S1 and S2 normal without gallop, murmur, click, rub or other extra sounds. Abdomen:     Bowel sounds positive,abdomen soft and non-tender without masses, organomegaly or hernias noted.  no renal bruits  Rectal:     anal skin tag and ext hem (non thrombosed )- non tender, nl tone with no bleeding  Msk:     No deformity or scoliosis noted of thoracic or lumbar spine.  no acute joint changes  Pulses:     R and L carotid,radial,femoral,dorsalis pedis and posterior tibial pulses are full and equal bilaterally Extremities:     No clubbing, cyanosis, edema, or deformity noted with normal full range of motion of all joints.   Neurologic:     sensation intact to light touch, gait normal, and DTRs symmetrical and normal.   Skin:     Intact without suspicious lesions or rashes Cervical Nodes:     No lymphadenopathy noted Axillary Nodes:     No palpable lymphadenopathy Inguinal Nodes:     No significant adenopathy Psych:     nl affect, slt anxious  Impression & Recommendations:  Problem # 1:  HEALTH MAINTENANCE EXAM (ICD-V70.0) Assessment: Comment Only reviewed health habits including diet, exercise and skin cancer prevention reviewed health maintenance list and family history would benefit from wt loss- disc health habits in general Orders: Venipuncture (59563) TLB-Lipid Panel (80061-LIPID) TLB-Hepatic/Liver Function Pnl (80076-HEPATIC) TLB-Renal Function Panel (80069-RENAL) TLB-CBC Platelet - w/Differential (85025-CBCD) TLB-TSH (Thyroid Stimulating  Hormone) (84443-TSH)   Problem # 2:  ANAL OR RECTAL PAIN (OVF-643.32) Assessment: Improved anal skin tag and small ext hem adv to keep stools soft and practice good hygiene (can continue tucks pads) adv to update me if inc pain or swelling or no imp in 2 weeks due for colonosc in 2011  Problem # 3:  Hx of CONNECTIVE TISSUE DISEASE (ICD-710.9) Assessment: Unchanged doing fairly well with low dose plaquenil will f/u with rheum as planned   Problem # 4:  HYPERLIPIDEMIA (ICD-272.4) Assessment: Unchanged disc diet in detail-- needs to watch sat fats labs today-- then will give some time for diet change-- if not imp may need to disc statin  also disc exercise Orders: Venipuncture (95188) TLB-Lipid Panel (80061-LIPID) TLB-Hepatic/Liver Function Pnl (80076-HEPATIC)  Labs Reviewed: Chol: 230 (10/17/2006)   HDL: 55.2 (10/17/2006)   LDL: DEL (10/17/2006)   TG: 151 (10/17/2006) SGOT: 23 (10/17/2006)   SGPT: 17 (10/17/2006)   Problem # 5:  Hx of POSTMENOPAUSAL STATUS (ICD-627.2) with severe vasomotor symptoms pt is aware of risks of HRT-- but continues them for symptoms- through her gyn Her updated medication list for this problem includes:    Femhrt 1/5 1-5 Mg-mcg Tabs (Norethindrone-eth estradiol) .Marland Kitchen... Take 1 tablet by mouth once a day   Complete Medication List: 1)  Adult Aspirin Low Strength 81 Mg Tbdp (Aspirin) .... Take one by mouth as directed 2)  Fish Oil 1200 Mg Caps (Omega-3 fatty acids) .... Take one by mouth daily 3)  Vitamin D 1000 Unit Tabs (Cholecalciferol) .... Take one by mouth daily 4)  Plaquenil 200 Mg Tabs (Hydroxychloroquine sulfate) .... Take 1 tablet by mouth once a day 5)  Femhrt 1/5 1-5 Mg-mcg Tabs (Norethindrone-eth estradiol) .... Take 1 tablet by mouth once a day   Patient Instructions: 1)  you can raise your HDL (good cholesterol) by increasing exercise and eating omega 3 fatty acid supplement like fish oil or flax seed oil over the counter 2)  you can  lower LDL (bad cholesterol) by limiting saturated fats in diet like red meat, fried foods, egg yolks, fatty breakfast meats, high fat dairy products and shellfish 3)  It is important that you exercise regularly at least 20 minutes 5 times a week. If you develop chest pain, have severe difficulty breathing, or feel very tired , stop exercising immediately and seek medical attention.   ] Current Allergies (reviewed today): CODEINE PENICILLIN VICODIN * BECONASE * RHINOCORT [place] [place] PATCH  Preventive Care Screening  Pap Smear:    Date:  07/23/2007    Results:  normal

## 2010-08-21 NOTE — Progress Notes (Signed)
Summary: Triage   Phone Note Call from Patient Call back at Home Phone [phone]   Caller: Patient Call For: Dr. Juanda Chance Reason for Call: Talk to Nurse Summary of Call: wants to know if she can take 200 mg of Highly absorbable magnesium per day Initial call taken by: Karna Christmas,  April 06, 2010 1:26 PM  Follow-up for Phone Call        patient advised ok based on phone note from Dr Juanda Chance 10-04-09.  Patient  advised ok to take upto 400 mg daily as needed for constipation. Follow-up by: Darcey Nora RN, CGRN,  April 06, 2010 1:34 PM

## 2010-08-23 NOTE — Letter (Signed)
Summary: Results Follow up Letter  [place] [facility]  [address] [place], [place] [address]   Phone: [phone]  Fax: [phone]    07/02/2010 MRN: [id number]  KORBIN NOTARO [address] [place], [place]  [address]  Dear Ms. Goodreau,  The following are the results of your recent test(s):  Test         Result    Pap Smear:        Normal _____  Not Normal _____ Comments: ______________________________________________________ Cholesterol: LDL(Bad cholesterol):         Your goal is less than:         HDL (Good cholesterol):       Your goal is more than: Comments:  ______________________________________________________ Mammogram:        Normal __X___  Not Normal _____ Comments:Repeat in 1 year  ___________________________________________________________________ Hemoccult:        Normal _____  Not normal _______ Comments:    _____________________________________________________________________ Other Tests:    We routinely do not discuss normal results over the telephone.  If you desire a copy of the results, or you have any questions about this information we can discuss them at your next office visit.   Sincerely,       Sharilyn Sites for Dr. Roxy Manns

## 2010-08-23 NOTE — Assessment & Plan Note (Signed)
Summary: sinus infection/alc   Vital Signs:  Patient profile:   65 year old female Height:      65.25 inches Weight:      177.75 pounds BMI:     29.46 Temp:     98.2 degrees F oral Pulse rate:   60 / minute Pulse rhythm:   regular BP sitting:   120 / 80  (right arm) Cuff size:   regular  Vitals Entered By: Linde Gillis CMA Duncan Dull) (August 01, 2010 12:11 PM) CC: sinus infection   History of Present Illness: 65 yo with chronic allergic rhinitis here for ? sinus infection.  Almost 1 1/2 weeks ago, started with runny nose, dry cough. Now has sinus pressure and cough and nasal drainage is more productive. Using a new expensive nasal spray prescribed by ENT, cannot remember name. Also taking Mucinex DM and Delsym as needed. They have provided some relief of symptoms. No fevers or chills. No wheezing, SOB or CP.  Current Medications (verified): 1)  Adult Aspirin Low Strength 81 Mg  Tbdp (Aspirin) .... Take One By Mouth As Directed 2)  Progesterone 200mg  .... One By Mouth Daily 3)  Armour Thyroid 120 Mg Tabs (Thyroid) .... Take 1 Tablet By Mouth Once A Day 4)  Vivelle-Dot 0.1 Mg/24hr Pttw (Estradiol) .... Change Patch Twice Weekly (Alternating With .075 Patch) 5)  Vivelle-Dot 0.075 Mg/24hr Pttw (Estradiol) .... Change Patch Twice Weekly Alternating With The .1 Patch. 6)  Liquid Vitamin D  10000iu .... One Ml By Mouth Once Weekly 7)  Magnesium ?mg .... Take 1 Tablet By Mouth Once A Day 8)  Protonix 40 Mg Tbec (Pantoprazole Sodium) .Marland Kitchen.. 1 By Mouth Once Daily 9)  Ultra Probiotic Balance 100 Billion Cfu .... One Tablet Three Times A Week 10)  Betaine Plus .... One Tablet By Mouth Before Each Meal. 11)  Niatain .... Take 1 Tablet By Mouth Once A Day 12)  Azithromycin 250 Mg  Tabs (Azithromycin) .... 2 By  Mouth Today and Then 1 Daily For 4 Days  Allergies: 1)  Codeine 2)  Penicillin 3)  Vicodin 4)  * Beconase 5)  * Rhinocort 6)  Claritin 7)  * Climara Patch  Past  History:  Past Medical History: Last updated: 04/18/2010 Hyperlipidemia Osteoarthritis- neck connective tissue dz deg disk/spinal stenosis-- CS , LS  obesity  endometrial ? polyp-- not resolved with Dand C-- following  bradycardia   hand sx-- Dr Teressa Senter  GI--Dr Juanda Chance  rheum-- Dr Janetta Hora  cardiol Tenny Craw  sees ? herbalist at [facility] (nurse practitioner)  Past Surgical History: Last updated: 05/31/2008 Tonsillectomy Dexa- normal (12/1998)-   ok (05/2006) Exercise stress test- neg (05/2000) Appendectomy Tubal ligation Colonoscopy- polyps, diverticulitis (04/2003) D & C- fibroids Abd [place]- neg (09/2004) D & C- endometrial polyp (01/2005) Stress myoview- neg EF 69% (03/2005) NCV/EMG- neg (03/2006) Endo biopsy- neg per pt (2008) Abd [place]- neg (05/1997) EGD nl with [facility] 10/09  Family History: Last updated: May 20, 2010 Father: CAD, MI (died at 52) Mother:  Siblings:  GM DM cousins- DM No FH of Colon Cancer: gm with goiter  grandson dying of a very rare cancer at age 60  Social History: Last updated: 01/29/2008 Marital Status: Married Children:  Occupation:  non smoker no alcohol   Risk Factors: Smoking Status: never (01/19/2007)  Review of Systems      See HPI General:  Denies chills and fever. ENT:  Complains of earache, postnasal drainage, sinus pressure, and sore throat. CV:  Denies chest  pain or discomfort. Resp:  Complains of cough and sputum productive; denies shortness of breath and wheezing.  Physical Exam  General:  overweight but generally well appearing  VSS, non toxic appearing. Ears:  R ear normal and L ear normal.   Nose:  no nasal discharge.   boggy turbinates, sinuses =/- TTP Mouth:  pharynx pink and moist.   Lungs:  Normal respiratory effort, chest expands symmetrically. Lungs are clear to auscultation, no crackles or wheezes. Heart:  Normal rate and regular rhythm. S1 and S2 normal without gallop, murmur, click, rub or other extra  sounds. Extremities:  No clubbing, cyanosis, edema, or deformity noted with normal full range of motion of all joints.   Psych:  normal affect, talkative and pleasant    Impression & Recommendations:  Problem # 1:  OTHER ACUTE SINUSITIS (ICD-461.8) Assessment New Given duration and progression of symptoms, will treat for bacterial sinusitis with Zpack. Continue Mucinex, Delsym and nasal steroid for supportive care. Her updated medication list for this problem includes:    Azithromycin 250 Mg Tabs (Azithromycin) .Marland Kitchen... 2 by  mouth today and then 1 daily for 4 days  Complete Medication List: 1)  Adult Aspirin Low Strength 81 Mg Tbdp (Aspirin) .... Take one by mouth as directed 2)  Progesterone 200mg   .... One by mouth daily 3)  Armour Thyroid 120 Mg Tabs (Thyroid) .... Take 1 tablet by mouth once a day 4)  Vivelle-dot 0.1 Mg/24hr Pttw (Estradiol) .... Change patch twice weekly (alternating with .075 patch) 5)  Vivelle-dot 0.075 Mg/24hr Pttw (Estradiol) .... Change patch twice weekly alternating with the .1 patch. 6)  Liquid Vitamin D 10000iu  .... One ml by mouth once weekly 7)  Magnesium ?mg  .... Take 1 tablet by mouth once a day 8)  Protonix 40 Mg Tbec (Pantoprazole sodium) .Marland Kitchen.. 1 by mouth once daily 9)  Ultra Probiotic Balance 100 Billion Cfu  .... One tablet three times a week 10)  Betaine Plus  .... One tablet by mouth before each meal. 11)  Niatain  .... Take 1 tablet by mouth once a day 12)  Azithromycin 250 Mg Tabs (Azithromycin) .... 2 by  mouth today and then 1 daily for 4 days Prescriptions: AZITHROMYCIN 250 MG  TABS (AZITHROMYCIN) 2 by  mouth today and then 1 daily for 4 days  #6 x 0   Entered and Authorized by:   Ruthe Mannan MD   Signed by:   Ruthe Mannan MD on 08/01/2010   Method used:   Electronically to        [facility]* (retail)       [address]       [place], [place]  [address]       Ph: [phone]       Fax: [phone]   RxID:    [id number]    Orders Added: 1)  Est. Patient Level III [75643]    Current Allergies (reviewed today): CODEINE PENICILLIN VICODIN * BECONASE * RHINOCORT CLARITIN [facility] PATCH

## 2010-08-23 NOTE — Miscellaneous (Signed)
   Clinical Lists Changes  Observations: Added new observation of MAMMO DUE: 06/2011 (07/02/2010 13:54) Added new observation of MAMMOGRAM: normal (06/28/2010 13:54)      Preventive Care Screening  Mammogram:    Date:  06/28/2010    Next Due:  06/2011    Results:  normal

## 2010-08-23 NOTE — Letter (Signed)
Summary: [facility]  [facility]   Imported By: Lester [place] 06/28/2010 10:57:13  _____________________________________________________________________  External Attachment:    Type:   Image     Comment:   External Document

## 2010-08-29 ENCOUNTER — Encounter: Payer: Self-pay | Admitting: Family Medicine

## 2010-08-29 DIAGNOSIS — E785 Hyperlipidemia, unspecified: Secondary | ICD-10-CM | POA: Insufficient documentation

## 2010-08-29 DIAGNOSIS — E669 Obesity, unspecified: Secondary | ICD-10-CM

## 2010-08-29 DIAGNOSIS — N840 Polyp of corpus uteri: Secondary | ICD-10-CM

## 2010-08-29 DIAGNOSIS — R001 Bradycardia, unspecified: Secondary | ICD-10-CM

## 2010-08-29 DIAGNOSIS — M4800 Spinal stenosis, site unspecified: Secondary | ICD-10-CM | POA: Insufficient documentation

## 2010-08-29 DIAGNOSIS — M359 Systemic involvement of connective tissue, unspecified: Secondary | ICD-10-CM

## 2010-08-29 DIAGNOSIS — M47812 Spondylosis without myelopathy or radiculopathy, cervical region: Secondary | ICD-10-CM | POA: Insufficient documentation

## 2010-08-29 DIAGNOSIS — M5030 Other cervical disc degeneration, unspecified cervical region: Secondary | ICD-10-CM | POA: Insufficient documentation

## 2010-08-29 DIAGNOSIS — M5136 Other intervertebral disc degeneration, lumbar region: Secondary | ICD-10-CM | POA: Insufficient documentation

## 2010-08-29 NOTE — Assessment & Plan Note (Signed)
Summary: 3 MONTH  FOLLOW UP/[facility]   Vital Signs:  Patient profile:   65 year old female Height:      65.25 inches Weight:      180 pounds BMI:     29.83 Temp:     98.1 degrees F oral Pulse rate:   60 / minute Pulse rhythm:   regular BP sitting:   110 / 68  (left arm) Cuff size:   regular  Vitals Entered By: Lewanda Rife LPN (August 15, 2010 11:44 AM) CC: three month f/u   History of Present Illness: here for f/u of hyperlipidemia   wt is up 3 lb with bmi 29  110/68 good bp   chol is up  trig 6 and HDL 47 and LDL 166 from 144  no longer on niacin   eats red meat - twice per month  no fried foods  does eat egg yolks 2-4  occ egg salad  real butter in baking -- / and in her grits  breakfast meats occ twice a week  shellfish once per week   has appt at [facility] for derm to disc hair loss   started taking biotin for thin hair   seeing Dr Judie Petit this afternoon  has f/u -- re check    Allergies: 1)  Codeine 2)  Penicillin 3)  Vicodin 4)  * Beconase 5)  * Rhinocort 6)  Claritin 7)  * Climara Patch  Past History:  Past Medical History: Last updated: 04/18/2010 Hyperlipidemia Osteoarthritis- neck connective tissue dz deg disk/spinal stenosis-- CS , LS  obesity  endometrial ? polyp-- not resolved with Dand C-- following  bradycardia   hand sx-- Dr Teressa Senter  GI--Dr Juanda Chance  rheum-- Dr Janetta Hora  cardiol Tenny Craw  sees ? herbalist at [facility] (nurse practitioner)  Past Surgical History: Last updated: 05/31/2008 Tonsillectomy Dexa- normal (12/1998)-   ok (05/2006) Exercise stress test- neg (05/2000) Appendectomy Tubal ligation Colonoscopy- polyps, diverticulitis (04/2003) D & C- fibroids Abd [place]- neg (09/2004) D & C- endometrial polyp (01/2005) Stress myoview- neg EF 69% (03/2005) NCV/EMG- neg (03/2006) Endo biopsy- neg per pt (2008) Abd [place]- neg (05/1997) EGD nl with [facility] 10/09  Family History: Last updated: 2010-06-02 Father: CAD, MI (died at  47) Mother:  Siblings:  GM DM cousins- DM No FH of Colon Cancer: gm with goiter  grandson dying of a very rare cancer at age 41  Social History: Last updated: 01/29/2008 Marital Status: Married Children:  Occupation:  non smoker no alcohol   Risk Factors: Smoking Status: never (01/19/2007)  Review of Systems General:  Complains of fatigue; denies loss of appetite and sleep disorder. Eyes:  Denies blurring and eye irritation. CV:  Denies chest pain or discomfort, lightheadness, and palpitations. Resp:  Denies cough, shortness of breath, and wheezing. GI:  Denies abdominal pain, change in bowel habits, indigestion, and nausea. GU:  Denies dysuria and urinary frequency. MS:  Denies muscle aches and cramps. Derm:  Complains of hair loss; denies dryness. Neuro:  Denies numbness and tingling. Endo:  Denies cold intolerance, excessive thirst, excessive urination, and heat intolerance. Heme:  Denies abnormal bruising and bleeding.  Physical Exam  General:  overweight but generally well appearing  Head:  normocephalic, atraumatic, and no abnormalities observed.   Eyes:  vision grossly intact, pupils equal, pupils round, and pupils reactive to light.  no conjunctival pallor, injection or icterus  Mouth:  pharynx pink and moist.   Neck:  supple with full rom and no  masses or thyromegally, no JVD or carotid bruit  Lungs:  Normal respiratory effort, chest expands symmetrically. Lungs are clear to auscultation, no crackles or wheezes. Heart:  Normal rate and regular rhythm. S1 and S2 normal without gallop, murmur, click, rub or other extra sounds. Pulses:  R and L carotid,radial,femoral,dorsalis pedis and posterior tibial pulses are full and equal bilaterally Extremities:  No clubbing, cyanosis, edema, or deformity noted with normal full range of motion of all joints.   Neurologic:  sensation intact to light touch, gait normal, and DTRs symmetrical and normal.   Skin:  Intact without  suspicious lesions or rashes Cervical Nodes:  No lymphadenopathy noted Psych:  normal affect, talkative and pleasant    Impression & Recommendations:  Problem # 1:  HYPERLIPIDEMIA (ICD-272.4) Assessment Deteriorated rev in detail her lipid profile  after diet inventory- disc that her diet is fairly high in sat fats expl to pt that we need to control this factor before deciding whether she needs med  will stop sat fats/ try to exercise/continue omega 3 and re check lab 2 mo and f/u  she does have fam hx of MI at early age -- disc importance of this with her   Complete Medication List: 1)  Adult Aspirin Low Strength 81 Mg Tbdp (Aspirin) .... Take one tablet three times a week 2)  Progesterone 200mg   .... One by mouth daily 3)  Vivelle-dot 0.075 Mg/24hr Pttw (Estradiol) .... Change patch twice weekly 4)  Protonix 40 Mg Tbec (Pantoprazole sodium) .Marland Kitchen.. 1 by mouth once daily 5)  Magnesium Oxide 400 Mg Tabs (Magnesium oxide) .... Take 1 tablet by mouth once a day 6)  Biotin ?mg  .... Take 1 tablet by mouth once a day 7)  New Prescription Nose Spray Unsure of Name   Patient Instructions: 1)  you can raise your HDL (good cholesterol) by increasing exercise and eating omega 3 fatty acid supplement like fish oil or flax seed oil over the counter 2)  you can lower LDL (bad cholesterol) by limiting saturated fats in diet like red meat, fried foods, egg yolks, fatty breakfast meats, high fat dairy products and shellfish  3)  you can have lean pork with fat cut off but not sausage and bacon 4)  [place] sausage and bacon are fine 5)  egg beaters are fine  6)  use low cholesterol spread instead of butter 7)  give up red meat and shellfish  8)  no fried foods  9)  go ahead and take omega 3  10)  schedule fasting lab in 2 months lipid/ast/alt 272 and then follow up    Orders Added: 1)  Est. Patient Level III [16109]    Current Allergies (reviewed today): CODEINE PENICILLIN VICODIN *  BECONASE * RHINOCORT CLARITIN [facility] PATCH

## 2010-09-06 NOTE — Letter (Signed)
Summary: [facility]   Imported By: Kassie Mends 08/24/2010 10:42:45  _____________________________________________________________________  External Attachment:    Type:   Image     Comment:   External Document

## 2010-10-02 ENCOUNTER — Other Ambulatory Visit: Payer: Self-pay | Admitting: Family Medicine

## 2010-10-02 ENCOUNTER — Ambulatory Visit (INDEPENDENT_AMBULATORY_CARE_PROVIDER_SITE_OTHER): Payer: BC Managed Care – PPO | Admitting: Family Medicine

## 2010-10-02 ENCOUNTER — Encounter: Payer: Self-pay | Admitting: Family Medicine

## 2010-10-02 DIAGNOSIS — K3189 Other diseases of stomach and duodenum: Secondary | ICD-10-CM

## 2010-10-02 DIAGNOSIS — R1013 Epigastric pain: Secondary | ICD-10-CM

## 2010-10-03 LAB — H. PYLORI ANTIBODY, IGG: H Pylori IgG: NEGATIVE

## 2010-10-09 ENCOUNTER — Other Ambulatory Visit (INDEPENDENT_AMBULATORY_CARE_PROVIDER_SITE_OTHER): Payer: BC Managed Care – PPO | Admitting: Family Medicine

## 2010-10-09 DIAGNOSIS — E785 Hyperlipidemia, unspecified: Secondary | ICD-10-CM

## 2010-10-09 DIAGNOSIS — E7800 Pure hypercholesterolemia, unspecified: Secondary | ICD-10-CM

## 2010-10-09 LAB — AST: AST: 24 U/L (ref 0–37)

## 2010-10-09 NOTE — Assessment & Plan Note (Signed)
Summary: stomach pain, not feeling right   Vital Signs:  Patient profile:   65 year old female Weight:      176.75 pounds Temp:     98.4 degrees F oral Pulse rate:   56 / minute Pulse rhythm:   regular BP sitting:   112 / 72  (left arm) Cuff size:   regular  Vitals Entered By: Selena Batten Dance CMA Duncan Dull) (October 02, 2010 3:22 PM) CC: Stomach doesn't feel right   History of Present Illness: CC: stomach issues  09/24/2010 - stomach virus with vomiting/diarrhea.  got over this, still having stomach issues.  Vomiting, diarrhea resolved.  No abd pain.  Feels nauseated occasionally with movement.  Feels "stomach is not right".  appetite slowly picking up.  after eating feels full and bloated.  Drinking plenty of water.  knows foods to stay away from.  currently a bit of constipation.  No fevers/chills, no blood in stool, emesis NBNB.  no dysuria, urgency, frequency.  no weight loss. no dysphagia.  + h/o gas and belching, recently increased.  bloating present regardless of time of day or foods.  Always worse at night.  Takes protonix for last few months which helps.    yesterday saw OBGYN.  align recommended.  last night took align.  doesn't think has given enough time to work.  Has not been tested for H pylori.  would like to.  colonoscopy 2004 - polyps.  due rpt.  nnsmoker, nondrinker  Current Medications (verified): 1)  Adult Aspirin Low Strength 81 Mg  Tbdp (Aspirin) .... Take One Tablet Three Times A Week 2)  Progesterone 200mg  .... One By Mouth Daily 3)  Vivelle-Dot 0.075 Mg/24hr Pttw (Estradiol) .... Change Patch Twice Weekly 4)  Protonix 40 Mg Tbec (Pantoprazole Sodium) .Marland Kitchen.. 1 By Mouth Once Daily 5)  Magnesium Oxide 400 Mg Tabs (Magnesium Oxide) .... Take 1 Tablet By Mouth Once A Day 6)  Biotin ?mg .... Take 1 Tablet By Mouth Once A Day 7)  New Prescription Nose Spray Unsure of Name 8)  Vitamin B-12 1000 Mcg Subl (Cyanocobalamin) .... One Daily  Allergies: 1)  Codeine 2)   Penicillin 3)  Vicodin 4)  * Beconase 5)  * Rhinocort 6)  Claritin 7)  * Climara Patch  Past History:  Past Medical History: Last updated: 04/18/2010 Hyperlipidemia Osteoarthritis- neck connective tissue dz deg disk/spinal stenosis-- CS , LS  obesity  endometrial ? 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   Lungs:  Normal respiratory effort, chest expands symmetrically. Lungs are clear to auscultation, no crackles or wheezes. Heart:  Normal rate and regular rhythm. S1 and S2 normal without gallop, murmur, click, rub or other extra sounds. Abdomen:  Bowel sounds positive,abdomen soft and without masses, organomegaly or hernias noted.  mild tenderness to deep palpation epigastrically.  no rebound/guarding. Pulses:  2+ rad pulses, brisk cap refill   Impression & Recommendations:  Problem # 1:  DYSPEPSIA (ICD-536.8) reasonable to check H pylori (protonix for months, better but not fully resolved).  if +, treat If -,  give more time (recently worsened after viral gastro), if continued for 2-4 wks, consider referral to GI for consideration of EGD although no red flags. may continue align for bloating.  Orders: Venipuncture (57846) TLB-H. Pylori Abs(Helicobacter Pylori) (86677-HELICO)  Complete Medication List: 1)  Adult Aspirin Low Strength 81 Mg Tbdp (Aspirin) .... Take one tablet three times a week 2)  Progesterone 200mg   .... One by mouth daily 3)  Vivelle-dot 0.075 Mg/24hr Pttw (Estradiol) .... Change patch twice weekly 4)  Protonix 40 Mg Tbec (Pantoprazole sodium) .Marland Kitchen.. 1 by  mouth once daily 5)  Magnesium Oxide 400 Mg Tabs (Magnesium oxide) .... Take 1 tablet by mouth once a day 6)  Biotin ?mg  .... Take 1 tablet by mouth once a day 7)  New Prescription Nose Spray Unsure of Name  8)  Vitamin B-12 1000 Mcg Subl (Cyanocobalamin) .... One daily  Patient Instructions: 1)  tested for H pylori today. 2)  may continue Align. 3)  push fluids, rest.  continue protonix.   4)  body may need more time to recover from viral gastro. 5)  Good to see you today, hope you start feeling better. 6)  Call clinic with questions.   Orders Added: 1)  Venipuncture [36415] 2)  TLB-H. Pylori Abs(Helicobacter Pylori) [86677-HELICO] 3)  Est. Patient Level III [96295]    Current Allergies (reviewed today): CODEINE PENICILLIN VICODIN * BECONASE * RHINOCORT CLARITIN [facility] PATCH

## 2010-10-16 ENCOUNTER — Encounter: Payer: Self-pay | Admitting: Family Medicine

## 2010-10-16 ENCOUNTER — Ambulatory Visit (INDEPENDENT_AMBULATORY_CARE_PROVIDER_SITE_OTHER): Payer: BC Managed Care – PPO | Admitting: Family Medicine

## 2010-10-16 VITALS — BP 124/80 | HR 56 | Temp 98.0°F | Ht 65.25 in | Wt 176.8 lb

## 2010-10-16 DIAGNOSIS — E785 Hyperlipidemia, unspecified: Secondary | ICD-10-CM

## 2010-10-16 MED ORDER — PRAVASTATIN SODIUM 10 MG PO TABS: 10.0000 mg | ORAL_TABLET | Freq: Every evening | ORAL | Status: DC

## 2010-10-16 NOTE — Patient Instructions (Signed)
Keep working hard on low fat diet  Start pravastatin 10 mg once daily  Schedule fasting labs in 6-8 weeks -- then we will update you further If any side effects - stop the medicine and call

## 2010-10-16 NOTE — Assessment & Plan Note (Signed)
With improved diet LDL came down from 166 to 161-- disc goal of below 130 (better below 100) Rev low sat fat diet in great detail again today I do rec fish oil or fish consumption Agreed to try pravachol at low dose of 10 Disc poss side eff- will call if problems  Lab 6-8 weeks and titrate

## 2010-10-16 NOTE — Progress Notes (Signed)
Subjective:    Patient ID: Morgan [name], female    DOB: 1946-01-02, 65 y.o.   MRN: [id number]  HPI  Here for f/u for hyperlipidemia  Some improvement  Trig 81 andHDL 48-- stable, and LDL 148 down from 166.4 Diet rev at last visit- too high in sat fat Does have fam hx of vasc dz  Made some diet change  Cut down shellfish-- twice per month  Eggs - eats twice weekly - she refuses to try egg beaters  Refuses to stop egg salad  Changed to [place] sausage  No beef  No fried foods    Is open to statin   Omega 3 - took for a while - would rather eat fish   Wt is stable - obese  bp good at 124/80  Past Medical History  Diagnosis Date  . HLD (hyperlipidemia)   . Osteoarthritis of cervical spine   . Connective tissue disease   . DDD (degenerative disc disease), cervical   . DDD (degenerative disc disease), lumbar   . Spinal stenosis   . Obesity   . Endometrial polyp     not resolved with D&C (following)  . Bradycardia     Past Surgical History  Procedure Date  . Hand surgery     Dr. Teressa Senter  . Tonsillectomy   . Appendectomy   . Tubal ligation   . Dilation and curettage of uterus     fibroids  . Dilation and curettage of uterus 01/2005    endometrial polyp  . Endometrial biopsy 2008    negative per patient   History   Social History  . Marital Status: Married    Spouse Name: N/A    Number of Children: N/A  . Years of Education: N/A   Social History Main Topics  . Smoking status: Never Smoker   . Smokeless tobacco: None  . Alcohol Use: None  . Drug Use: None  . Sexually Active: None   Other Topics Concern  . None   Social History Narrative  . None    Family History  Problem Relation Age of Onset  . Coronary artery disease Father   . Heart attack Father     deceased at 81  . Diabetes Maternal Grandmother   . Diabetes Cousin   . Goiter Maternal Grandmother   . Cancer Grandchild     rare type (dying at age 72)       Review of Systems    Constitutional: Negative for appetite change and fatigue.  Eyes: Negative for visual disturbance.  Respiratory: Negative for cough and shortness of breath.   Cardiovascular: Negative.   Gastrointestinal: Negative for vomiting and abdominal pain.  Genitourinary: Negative for frequency.  Musculoskeletal: Negative for arthralgias.  Skin: Negative for color change and pallor.  Neurological: Negative for weakness, light-headedness and headaches.  Hematological: Does not bruise/bleed easily.  Psychiatric/Behavioral: Negative for dysphoric mood.       Objective:   Physical Exam  Constitutional: She appears distressed.       overwt and well appearing   HENT:  Head: Atraumatic.  Eyes: Conjunctivae are normal. Pupils are equal, round, and reactive to light.  Neck: Normal range of motion. Neck supple. Carotid bruit is not present. No thyromegaly present.  Cardiovascular: Normal rate, regular rhythm and normal heart sounds.   Pulmonary/Chest: Effort normal and breath sounds normal.  Abdominal: Soft. Bowel sounds are normal. She exhibits no abdominal bruit and no pulsatile midline mass.  Musculoskeletal: She exhibits no  edema.  Lymphadenopathy:    She has no cervical adenopathy.  Neurological: She has normal reflexes. Coordination normal.  Skin: Skin is warm and dry.  Psychiatric: She has a normal mood and affect.          Assessment & Plan:

## 2010-10-17 ENCOUNTER — Ambulatory Visit: Payer: Self-pay | Admitting: Family Medicine

## 2010-10-27 LAB — URINALYSIS, ROUTINE W REFLEX MICROSCOPIC
Bilirubin Urine: NEGATIVE
Glucose, UA: NEGATIVE mg/dL
Ketones, ur: NEGATIVE mg/dL
Nitrite: NEGATIVE
Protein, ur: NEGATIVE mg/dL

## 2010-10-27 LAB — HEPATIC FUNCTION PANEL
Bilirubin, Direct: 0.1 mg/dL (ref 0.0–0.3)
Total Bilirubin: 0.6 mg/dL (ref 0.3–1.2)

## 2010-10-27 LAB — BASIC METABOLIC PANEL
CO2: 26 mEq/L (ref 19–32)
Chloride: 111 mEq/L (ref 96–112)
GFR calc Af Amer: >60 mL/min (ref >60)
Sodium: 141 mEq/L (ref 135–145)

## 2010-10-27 LAB — DIFFERENTIAL
Basophils Relative: 0 % (ref 0–1)
Eosinophils Absolute: 0.1 10*3/uL (ref 0.0–0.7)
Monocytes Absolute: 1.1 10*3/uL — ABNORMAL HIGH (ref 0.1–1.0)
Monocytes Relative: 17 % — ABNORMAL HIGH (ref 3–12)

## 2010-10-27 LAB — CBC
HCT: 40.6 % (ref 36.0–46.0)
Hemoglobin: 14.1 g/dL (ref 12.0–15.0)
MCHC: 34.7 g/dL (ref 30.0–36.0)
MCV: 89.3 fL (ref 78.0–100.0)
RBC: 4.55 MIL/uL (ref 3.87–5.11)

## 2010-10-27 LAB — LIPASE, BLOOD: Lipase: 20 U/L (ref 11–59)

## 2010-12-04 NOTE — Assessment & Plan Note (Signed)
[facility]                            CARDIOLOGY OFFICE NOTE   NAME:[name], [name]                        MRN:          [id number]  DATE:06/10/2008                            DOB:          Aug 14, 1945    IDENTIFICATION:  Morgan [name] is a 65 year old woman, I saw her back in  September 2006.  She has a history of palpitations, occasional shortness  of breath with exertion.  Note, she had had a Myoview done in 2006 that  was normal.   The patient is referred because during an upper endoscopy, she was found  to have some bradycardia.   On talking to her, she denies dizziness.  She still notes occasional  shortness of breath, but no change.  He is able to do activities as  tolerated.  No chest pain.   CURRENT MEDICATIONS:  Include aspirin 81, fish oil 1 g, estradiol cream,  progesterone 200, vitamin D.  Advance the T3, T4, and vitamin E.   PHYSICAL EXAMINATION:  GENERAL:  The patient is in no distress.  VITAL SIGNS:  Blood pressure is 120/60, pulse is 56 and regular, weight  190.  NECK:  No bruits.  LUNGS:  Clear without rales or wheezes.  CARDIAC:  Regular rate and rhythm, S1, S2.  No S3, S4, or significant  murmurs.  ABDOMEN:  Benign.  EXTREMITIES:  No edema.   A 12-lead EKG shows normal sinus bradycardia at a rate of 56 beats per  minute with normal conduction intervals.  Left axis deviation.   IMPRESSION:  1. Bradycardia.  The patient had a spell during anesthesia with an      upper endoscopy, otherwise she is asymptomatic.  Her EKG, she has      always had a mild bradycardia.  I think, she has tolerated well.  I      see no signs to suggest a significant or hemodynamically      destabilizing bradycardia, some may be related to her thyroid which      is now being replaced.  I would not plan any further workup for now      unless she develops symptoms.  2. Health care maintenance.  Review of her lipids shows a total      cholesterol of 213,  HDL of 50, LDL of 144.  I recommended she work      on her diet.  Again, she is on some thyroid supplement and we will      recheck in the spring.   Otherwise, I will set followup for p.r.n.  I will again be in touch with  her regarding her blood work.     Pricilla Riffle, MD, [facility]  Electronically Signed   PVR/MedQ  DD: 06/10/2008  DT: 06/11/2008  Job #: [id number]

## 2010-12-04 NOTE — Assessment & Plan Note (Signed)
[facility]                         GASTROENTEROLOGY OFFICE NOTE   NAME:[name], [name]                        MRN:          [id number]  DATE:09/07/2007                            DOB:          1945-12-22    PROBLEM:  Hemorrhoids and rectal bleeding.   HISTORY:  The patient is a 65 year old white female known to [place].  Juanda Chance, M.D., who had undergone prior colonoscopy in October of 2004.  This was done for routine screening.  At that time she had diminutive  colon polyp in the rectum removed and was noted to have left-sided  diverticulosis.  The biopsy from the polyp showed unremarkable colonic  mucosa and the plan was for follow-up in 10 years.  We have not seen her  since that time.  She presents now with complaints of intermittent  rectal bleeding over the past four months.  The patient says that she  has had intermittent problems with hemorrhoids over the years, but has  been having more problems with constipation recently which she feels has  worsened her hemorrhoidal symptoms.  She says that she sees bright red  blood on the tissue frequently after a constipated stool and has rectal  discomfort with bowel movements as well.  She feels that she has an  internal hemorrhoid that prolapses out and causes discomfort and itching  at times.  She has been trying to eat a high fiber diet, has not been on  any regular laxative regimen or fiber supplements.  She also complains  of having a lot of intestinal gas.  She has tried over-the-counter  Preparation-H but admits she has not used this consistently.   CURRENT MEDICATIONS:  1. Aspirin 81 mg daily.  2. Fish Oil supplement daily.  3. Mobic p.r.n.  4. Nasonex p.r.n.   ALLERGIES:  CODEINE, PENICILLIN, VICODIN.   PAST MEDICAL HISTORY:  Pertinent for appendectomy, bilateral tubal  ligation, hyperlipidemia, and arthritis.  She says she was diagnosed  with an undifferentiated connective tissue disorder  about a year and a  half ago, I believe she sees Lemmie Evens, M.D. for that and has  required steroid taper in the past.  She says that she has been having a  lot of pain in her hands recently.   FAMILY HISTORY:  Negative for GI disease, positive for heart disease in  her father and diabetes in maternal grandmother.   SOCIAL HISTORY:  The patient is married and has four children.  She is  self-employed and is a nonsmoker and nondrinker.   REVIEW OF SYSTEMS:  Pertinent for connective tissue symptoms primarily  with pain in her joints of her upper extremities and hands.  She has  some low back pain as well and has been having some problems with her  skin as well.  She was told that she has a seborrheic dermatitis.  Otherwise as in HPI.   PHYSICAL EXAMINATION:  GENERAL:  A well-developed, white female in no  acute distress.  VITAL SIGNS:  Height is 5 feet 5-3/4 inches, weight 198, blood pressure  120/70, pulse 76.  CARDIOVASCULAR:  Regular rate and rhythm with S1 and S2.  No murmurs,  rubs, or gallops.  LUNGS:  Clear to A&P.  ABDOMEN:  Soft and nontender.  There is no palpable mass or  hepatosplenomegaly.  RECTAL:  She does have a scarred perineum and had a prior episiotomy  with rectal tear and has skin tags externally.  There is no palpable  rectal mass.  Stool is mucoid and contains fresh blood.   IMPRESSION:  29. A 65 year old white female with intermittent hematochezia  for 4      months, probable hemorrhoidal etiology.  2. Constipation.   PLAN:  1. Discussed continued high fiber diet and liberalizing her fluid      intake which has been an issue for her.  2. Add MiraLax 17 grams daily in 8 ounces of water.  3. Add Benefiber once daily.  4. Anusol HC suppositories q.h.s. x10 days and then p.r.n.  5. Analpram 2.5% apply t.i.d. x10 days and then p.r.n.  6. Follow up with Dr. Lina Sar in 3-4 weeks.  If her symptoms      persist or relapse, we will need flexible  sigmoidoscopy versus      follow-up colonoscopy.      Mike Gip, PA-C  Electronically Signed      Wilhemina Bonito. Marina Goodell, MD  Electronically Signed   AE/MedQ  DD: 09/07/2007  DT: 09/08/2007  Job #: [id number]   cc:   Hedwig Morton. Juanda Chance, MD

## 2010-12-27 ENCOUNTER — Other Ambulatory Visit (INDEPENDENT_AMBULATORY_CARE_PROVIDER_SITE_OTHER): Payer: BC Managed Care – PPO

## 2010-12-27 DIAGNOSIS — E785 Hyperlipidemia, unspecified: Secondary | ICD-10-CM

## 2010-12-27 LAB — LIPID PANEL
Total CHOL/HDL Ratio: 4
VLDL: 13.2 mg/dL (ref 0.0–40.0)

## 2011-01-02 ENCOUNTER — Ambulatory Visit (INDEPENDENT_AMBULATORY_CARE_PROVIDER_SITE_OTHER): Payer: BC Managed Care – PPO | Admitting: Family Medicine

## 2011-01-02 ENCOUNTER — Encounter: Payer: Self-pay | Admitting: Family Medicine

## 2011-01-02 VITALS — BP 124/76 | HR 56 | Temp 98.1°F | Ht 65.25 in | Wt 179.0 lb

## 2011-01-02 DIAGNOSIS — E785 Hyperlipidemia, unspecified: Secondary | ICD-10-CM

## 2011-01-02 MED ORDER — PRAVASTATIN SODIUM 10 MG PO TABS: 5.0000 mg | ORAL_TABLET | Freq: Every evening | ORAL | Status: DC

## 2011-01-02 NOTE — Progress Notes (Signed)
Subjective:    Patient ID: Morgan [name], female    DOB: 10/24/1945, 65 y.o.   MRN: [id number]  HPI Here to disc labs - chol  On low dose pravachol 10 mg  LDL chol down from 148 to 409--- our goal Lab Results  Component Value Date   CHOL 199 12/27/2010   CHOL 215* 10/09/2010   CHOL 224* 08/09/2010   Lab Results  Component Value Date   HDL 56.10 12/27/2010   HDL 48.20 10/09/2010   HDL 81.19 08/09/2010   Lab Results  Component Value Date   LDLCALC 130* 12/27/2010   Lab Results  Component Value Date   TRIG 66.0 12/27/2010   TRIG 81.0 10/09/2010   TRIG 66.0 08/09/2010   Lab Results  Component Value Date   CHOLHDL 4 12/27/2010   CHOLHDL 4 10/09/2010   CHOLHDL 5 08/09/2010   Lab Results  Component Value Date   LDLDIRECT 148.8 10/09/2010   LDLDIRECT 166.4 08/09/2010   LDLDIRECT 144.5 01/29/2008    She cut her pravachol in 1/2 -- because she was dizzy  Her side effect was unusual -- but she noticed a big improvement in that  Still taking her omega red   Diet is great - pretty much no sat fats at all - proud of that   Patient Active Problem List  Diagnoses  . HYPOTHYROIDISM  . HYPERLIPIDEMIA  . CARPAL TUNNEL SYNDROME, BILATERAL  . TINNITUS  . BRADYCARDIA  . HEMORRHOIDS, INTERNAL  . GERD  . DYSPEPSIA  . DIVERTICULOSIS OF COLON  . IBS  . OVERACTIVE BLADDER  . POSTMENOPAUSAL STATUS  . DERMATITIS, SEBORRHEIC NEC  . ECZEMA  . HAIR LOSS  . CONNECTIVE TISSUE DISEASE  . OSTEOARTHRITIS  . NONSPECIFIC ABNORMAL TOXICOLOGICAL FINDINGS  . ALLERGY  . HEPATITIS, HX OF  . COLONIC POLYPS, HYPERPLASTIC, HX OF  . DDD (degenerative disc disease), cervical  . DDD (degenerative disc disease), lumbar  . Spinal stenosis  . Obesity  . Endometrial polyp   Past Medical History  Diagnosis Date  . HLD (hyperlipidemia)   . Osteoarthritis of cervical spine   . Connective tissue disease   . DDD (degenerative disc disease), cervical   . DDD (degenerative disc disease), lumbar   . Spinal  stenosis   . Obesity   . Endometrial polyp     not resolved with D&C (following)  . Bradycardia    Past Surgical History  Procedure Date  . Hand surgery     Dr. Teressa Senter  . Tonsillectomy   . Appendectomy   . Tubal ligation   . Dilation and curettage of uterus     fibroids  . Dilation and curettage of uterus 01/2005    endometrial polyp  . Endometrial biopsy 2008    negative per patient   History  Substance Use Topics  . Smoking status: Former Smoker    Quit date: 07/22/1965  . Smokeless tobacco: Not on file  . Alcohol Use: Not on file   Family History  Problem Relation Age of Onset  . Coronary artery disease Father   . Heart attack Father     deceased at 2  . Diabetes Maternal Grandmother   . Diabetes Cousin   . Goiter Maternal Grandmother   . Cancer Grandchild     rare type (dying at age 2)   Allergies  Allergen Reactions  . Codeine     REACTION: reaction not known  . Hydrocodone-Acetaminophen     REACTION: reaction not known  .  Loratadine     REACTION: reaction  not known  . Penicillins     REACTION: reaction not known   Current Outpatient Prescriptions on File Prior to Visit  Medication Sig Dispense Refill  . aspirin 81 MG tablet Take 81 mg by mouth daily.       . Biotin 1000 MCG tablet Take 1,000 mcg by mouth daily.        Marland Kitchen estradiol (VIVELLE-DOT) 0.075 MG/24HR Place 1 patch onto the skin twice a week.        . Multiple Vitamins-Minerals (HAIR VITAMINS PO) 1 tablet by mouth daily       . Probiotic Product (SOLUBLE FIBER/PROBIOTICS PO) OTC take as directed.       . progesterone (PROMETRIUM) 100 MG capsule Take 100 mg by mouth daily.        . magnesium oxide (MAG-OX) 400 MG tablet Take 400 mg by mouth daily.        . pantoprazole (PROTONIX) 40 MG tablet Take 40 mg by mouth daily.        . progesterone (PROMETRIUM) 200 MG capsule Take 200 mg by mouth daily.              Review of Systems Review of Systems  Constitutional: Negative for fever,  appetite change, fatigue and unexpected weight change.  Eyes: Negative for pain and visual disturbance.  Respiratory: Negative for cough and shortness of breath.   Cardiovascular: Negative.  for cp or palpitatoins Gastrointestinal: Negative for nausea, diarrhea and constipation.  Genitourinary: Negative for urgency and frequency.  MSK no joint or muscle pain on this dose Skin: Negative for pallor.  Neurological: Negative for weakness, light-headedness, numbness and headaches.  Hematological: Negative for adenopathy. Does not bruise/bleed easily.  Psychiatric/Behavioral: Negative for dysphoric mood. The patient is not nervous/anxious.          Objective:   Physical Exam  Constitutional: She appears well-developed and well-nourished.       overwt and well appearing   HENT:  Head: Normocephalic and atraumatic.  Mouth/Throat: Oropharynx is clear and moist.  Eyes: Conjunctivae and EOM are normal. Pupils are equal, round, and reactive to light.  Neck: Normal range of motion. Neck supple. No JVD present. Carotid bruit is not present. No thyromegaly present.  Cardiovascular: Normal rate, regular rhythm, normal heart sounds and intact distal pulses.   Pulmonary/Chest: Effort normal and breath sounds normal. No respiratory distress. She has no wheezes.  Abdominal: Soft. Bowel sounds are normal.  Musculoskeletal: She exhibits no edema and no tenderness.  Lymphadenopathy:    She has no cervical adenopathy.  Neurological: She is alert. She has normal reflexes. Coordination normal.  Skin: Skin is warm and dry. No rash noted. No erythema. No pallor.  Psychiatric: She has a normal mood and affect.          Assessment & Plan:

## 2011-01-02 NOTE — Assessment & Plan Note (Signed)
Improved on very small dose of statin -- 1/2 of 10 mg pravachol daily (all she can tolerate)  Will continue low sat fat diet Rev labs with pt - try to keep LDL under 130 Continue omega 3 suppl Lab in 6  month

## 2011-01-02 NOTE — Patient Instructions (Signed)
Continue the pravastatin (pravachol) at 1/2 pill daily  If any problems let me know Continue low sat fat diet (Avoid red meat/ fried foods/ egg yolks/ fatty breakfast meats/ butter, cheese and high fat dairy/ and shellfish  ) Schedule fasting labs 6 months

## 2011-03-08 ENCOUNTER — Encounter (HOSPITAL_COMMUNITY)
Admission: RE | Admit: 2011-03-08 | Discharge: 2011-03-08 | Disposition: A | Discharge status: Home / Self Care | Payer: BC Managed Care – PPO | Source: Ambulatory Visit | Attending: Obstetrics and Gynecology | Admitting: Obstetrics and Gynecology

## 2011-03-08 ENCOUNTER — Other Ambulatory Visit: Payer: Self-pay

## 2011-03-08 ENCOUNTER — Encounter (HOSPITAL_COMMUNITY): Payer: Self-pay

## 2011-03-08 HISTORY — DX: Gastro-esophageal reflux disease without esophagitis: K21.9

## 2011-03-08 HISTORY — DX: Nausea with vomiting, unspecified: Z98.890

## 2011-03-08 HISTORY — DX: Nausea with vomiting, unspecified: R11.2

## 2011-03-08 HISTORY — DX: Other overlap syndromes: M35.1

## 2011-03-08 LAB — CBC
MCV: 92.6 fL (ref 78.0–100.0)
Platelets: 181 10*3/uL (ref 150–400)
RBC: 4.32 MIL/uL (ref 3.87–5.11)
WBC: 5.6 10*3/uL (ref 4.0–10.5)

## 2011-03-08 NOTE — H&P (Signed)
Morgan [name] is an 65 y.o. female. G3P3 with PMB starting 01/03/11 continuing intermmittantly until 02/12/11. Pt had partial resection of a submucous fibroid about 5 years ago.  PUS done 02/12/11 showed a nl uterus with a 3.2 cm fundal fibroid and a 1.6 cm polyp.    Pertinent Gynecological History:    Past Medical History  Diagnosis Date  . HLD (hyperlipidemia)   . Osteoarthritis of cervical spine   . Connective tissue disease   . DDD (degenerative disc disease), cervical   . DDD (degenerative disc disease), lumbar   . Spinal stenosis   . Obesity   . Endometrial polyp     not resolved with D&C (following)  . Bradycardia   . PONV (postoperative nausea and vomiting)   . GERD (gastroesophageal reflux disease)     gas/belching issues-was on protonix for short time-not since 6 months  . Mixed connective tissue disease diagnosed 5 years ago    no problems since diagnosis    Past Surgical History  Procedure Date  . Hand surgery     carpal tunnel release bilateral Dr. Teressa Senter  . Tonsillectomy   . Appendectomy   . Dilation and curettage of uterus     fibroids  . Dilation and curettage of uterus 01/2005    endometrial polyp  . Endometrial biopsy 2008    fibroid  . Tubal ligation     1973    Family History  Problem Relation Age of Onset  . Coronary artery disease Father   . Heart attack Father     deceased at 52  . Diabetes Maternal Grandmother   . Diabetes Cousin   . Goiter Maternal Grandmother   . Cancer Grandchild     rare type (dying at age 4)    Social History:  reports that she quit smoking about 45 years ago. She does not have any smokeless tobacco history on file. She reports that she does not drink alcohol or use illicit drugs.  Allergies:  Allergies  Allergen Reactions  . Codeine Nausea And Vomiting  . Hydrocodone-Acetaminophen     REACTION: reaction not known  . Penicillins Hives    No prescriptions prior to admission    Review of Systems  All other  systems reviewed and are negative.    Last menstrual period 07/22/1989. Physical Exam  Constitutional: She is oriented to person, place, and time. She appears well-developed and well-nourished.  HENT:  Head: Normocephalic and atraumatic.  Eyes: Pupils are equal, round, and reactive to light.  Neck: Normal range of motion.  Cardiovascular: Normal rate and regular rhythm.   Respiratory: Effort normal and breath sounds normal.  GI: Soft. Bowel sounds are normal.  Genitourinary: Vagina normal and uterus normal.  Musculoskeletal: Normal range of motion.  Neurological: She is alert and oriented to person, place, and time.  Skin: Skin is warm and dry.    Results for orders placed during the hospital encounter of 03/08/11 (from the past 24 hour(s))  CBC     Status: Normal   Collection Time   03/08/11 10:06 AM      Component Value Range   WBC 5.6  4.0 - 10.5 (K/uL)   RBC 4.32  3.87 - 5.11 (MIL/uL)   Hemoglobin 13.4  12.0 - 15.0 (g/dL)   HCT 91.4  78.2 - 95.6 (%)   MCV 92.6  78.0 - 100.0 (fL)   MCH 31.0  26.0 - 34.0 (pg)   MCHC 33.5  30.0 - 36.0 (g/dL)  RDW 12.7  11.5 - 15.5 (%)   Platelets 181  150 - 400 (K/uL)    No results found.  Assessment/Plan: PMB, prob 2 to endometrial polyp.  Plan hysteroscopic resection, D and C.  Rhett Najera P 03/08/2011, 1:22 PM

## 2011-03-08 NOTE — Pre-Procedure Instructions (Signed)
Ekg reviewed per Dr Rodman Pickle, ok for LSD

## 2011-03-08 NOTE — Patient Instructions (Signed)
  Monnie Gudgel Your procedure is scheduled WU:[id number] 03/11/11 Enter through the Main Entrance of [facility] at:8:30 Pick up the phone a the desk and dial [phone] Please call this number if you have any problems the morning of surgery: [phone]  Remember: Do not eat food after midnight  Do not drink clear liquids after:midnight Take these medicines the morning of surgery with a SIP OF WATER:none  Do not wear jewelry, make-up, or nail polish Do not wear lotions, powders, or perfumes. Do not shave 48 hours prior to surgery. Do not bring valuables to the hospital.  Patients discharged on the day of surgery will not be allowed to drive home.   Name and phone number of your driver: [name] -[phone]  Please read over the Hibiclens Guide to General Skin Cleansing at Home. Remember that hibiclens is not to be used on the head, face, or vaginal area. Please shower with 1/2 bottle the evening before surgery and other 1/2 bottle morning of surgery prior to arrival at hospital.  Do not use any deodorants, lotions, or powders after hibiclens shower.

## 2011-03-11 ENCOUNTER — Encounter (HOSPITAL_COMMUNITY): Payer: Self-pay | Admitting: Anesthesiology

## 2011-03-11 ENCOUNTER — Encounter (HOSPITAL_COMMUNITY)
Admission: RE | Disposition: A | Discharge status: Home / Self Care | Payer: Self-pay | Source: Ambulatory Visit | Attending: Obstetrics and Gynecology

## 2011-03-11 ENCOUNTER — Ambulatory Visit (HOSPITAL_COMMUNITY): Payer: BC Managed Care – PPO | Admitting: Anesthesiology

## 2011-03-11 ENCOUNTER — Other Ambulatory Visit: Payer: Self-pay | Admitting: Obstetrics and Gynecology

## 2011-03-11 ENCOUNTER — Ambulatory Visit (HOSPITAL_COMMUNITY)
Admission: RE | Admit: 2011-03-11 | Discharge: 2011-03-11 | Disposition: A | Discharge status: Home / Self Care | Payer: BC Managed Care – PPO | Source: Ambulatory Visit | Attending: Obstetrics and Gynecology | Admitting: Obstetrics and Gynecology

## 2011-03-11 DIAGNOSIS — N840 Polyp of corpus uteri: Secondary | ICD-10-CM | POA: Insufficient documentation

## 2011-03-11 DIAGNOSIS — Z01818 Encounter for other preprocedural examination: Secondary | ICD-10-CM | POA: Insufficient documentation

## 2011-03-11 DIAGNOSIS — N950 Postmenopausal bleeding: Secondary | ICD-10-CM | POA: Insufficient documentation

## 2011-03-11 DIAGNOSIS — Z01812 Encounter for preprocedural laboratory examination: Secondary | ICD-10-CM | POA: Insufficient documentation

## 2011-03-11 SURGERY — DILATATION & CURETTAGE/HYSTEROSCOPY WITH RESECTOCOPE
Anesthesia: Choice | Wound class: Clean Contaminated

## 2011-03-11 MED ORDER — LACTATED RINGERS IV SOLN
INTRAVENOUS | Status: DC
  Administered 2011-03-11 (×2): via INTRAVENOUS

## 2011-03-11 MED ORDER — ONDANSETRON HCL 4 MG/2ML IJ SOLN
INTRAMUSCULAR | Status: AC
  Filled 2011-03-11: qty 2

## 2011-03-11 MED ORDER — ONDANSETRON HCL 4 MG/2ML IJ SOLN
INTRAMUSCULAR | Status: DC | PRN
  Administered 2011-03-11: 4 mg via INTRAVENOUS

## 2011-03-11 MED ORDER — FENTANYL CITRATE 0.05 MG/ML IJ SOLN: 25.0000 ug | INTRAMUSCULAR | Status: DC | PRN

## 2011-03-11 MED ORDER — GLYCOPYRROLATE 0.2 MG/ML IJ SOLN
INTRAMUSCULAR | Status: DC | PRN
  Administered 2011-03-11 (×2): 0.1 mg via INTRAVENOUS

## 2011-03-11 MED ORDER — PROPOFOL 10 MG/ML IV EMUL
INTRAVENOUS | Status: AC
  Filled 2011-03-11: qty 20

## 2011-03-11 MED ORDER — GLYCOPYRROLATE 0.2 MG/ML IJ SOLN
INTRAMUSCULAR | Status: AC
  Filled 2011-03-11: qty 1

## 2011-03-11 MED ORDER — KETOROLAC TROMETHAMINE 30 MG/ML IJ SOLN
INTRAMUSCULAR | Status: AC
  Filled 2011-03-11: qty 1

## 2011-03-11 MED ORDER — MIDAZOLAM HCL 5 MG/5ML IJ SOLN
INTRAMUSCULAR | Status: DC | PRN
  Administered 2011-03-11: 2 mg via INTRAVENOUS

## 2011-03-11 MED ORDER — LIDOCAINE HCL 1 % IJ SOLN
INTRAMUSCULAR | Status: DC | PRN
  Administered 2011-03-11: 10 mL

## 2011-03-11 MED ORDER — GLYCINE 1.5 % IR SOLN
Status: DC | PRN
  Administered 2011-03-11: 3000 mL

## 2011-03-11 MED ORDER — KETOROLAC TROMETHAMINE 30 MG/ML IJ SOLN
INTRAMUSCULAR | Status: DC | PRN
  Administered 2011-03-11: 30 mg via INTRAVENOUS

## 2011-03-11 MED ORDER — FENTANYL CITRATE 0.05 MG/ML IJ SOLN
INTRAMUSCULAR | Status: AC
  Filled 2011-03-11: qty 2

## 2011-03-11 MED ORDER — SCOPOLAMINE 1 MG/3DAYS TD PT72
1.0000 | MEDICATED_PATCH | TRANSDERMAL | Status: DC
  Administered 2011-03-11: 1.5 mg via TRANSDERMAL

## 2011-03-11 MED ORDER — PROMETHAZINE HCL 25 MG/ML IJ SOLN: 12.5000 mg | INTRAMUSCULAR | Status: DC | PRN

## 2011-03-11 MED ORDER — LIDOCAINE HCL (CARDIAC) 20 MG/ML IV SOLN
INTRAVENOUS | Status: DC | PRN
  Administered 2011-03-11: 60 mg via INTRAVENOUS

## 2011-03-11 MED ORDER — DEXTROSE IN LACTATED RINGERS 5 % IV SOLN: INTRAVENOUS | Status: DC

## 2011-03-11 MED ORDER — LIDOCAINE HCL (CARDIAC) 20 MG/ML IV SOLN
INTRAVENOUS | Status: AC
  Filled 2011-03-11: qty 5

## 2011-03-11 MED ORDER — PROPOFOL 10 MG/ML IV EMUL
INTRAVENOUS | Status: DC | PRN
  Administered 2011-03-11: 150 mg via INTRAVENOUS

## 2011-03-11 MED ORDER — MIDAZOLAM HCL 2 MG/2ML IJ SOLN
INTRAMUSCULAR | Status: AC
  Filled 2011-03-11: qty 2

## 2011-03-11 MED ORDER — FENTANYL CITRATE 0.05 MG/ML IJ SOLN
INTRAMUSCULAR | Status: DC | PRN
  Administered 2011-03-11: 100 ug via INTRAVENOUS

## 2011-03-11 MED ORDER — DEXAMETHASONE SODIUM PHOSPHATE 10 MG/ML IJ SOLN
INTRAMUSCULAR | Status: DC | PRN
  Administered 2011-03-11: 10 mg via INTRAVENOUS

## 2011-03-11 MED ORDER — SCOPOLAMINE 1 MG/3DAYS TD PT72
MEDICATED_PATCH | TRANSDERMAL | Status: AC
  Administered 2011-03-11: 1.5 mg via TRANSDERMAL
  Filled 2011-03-11: qty 1

## 2011-03-11 MED ORDER — DEXAMETHASONE SODIUM PHOSPHATE 10 MG/ML IJ SOLN
INTRAMUSCULAR | Status: AC
  Filled 2011-03-11: qty 1

## 2011-03-11 SURGICAL SUPPLY — 18 items
CANISTER SUCTION 2500CC (MISCELLANEOUS) ×2 IMPLANT
CATH ROBINSON RED A/P 16FR (CATHETERS) ×2 IMPLANT
CONTAINER PREFILL 10% NBF 60ML (FORM) ×4 IMPLANT
CORD ACTIVE DISPOSABLE (ELECTRODE) ×1
CORD ELECTRO ACTIVE DISP (ELECTRODE) ×1 IMPLANT
DRAPE UTILITY XL STRL (DRAPES) ×4 IMPLANT
ELECT LOOP GYNE PRO 24FR (CUTTING LOOP)
ELECT REM PT RETURN 9FT ADLT (ELECTROSURGICAL) ×2
ELECT VAPORTRODE GRVD BAR (ELECTRODE) IMPLANT
ELECTRODE LOOP GYNE PRO 24FR (CUTTING LOOP) IMPLANT
ELECTRODE REM PT RTRN 9FT ADLT (ELECTROSURGICAL) ×1 IMPLANT
GLOVE BIOGEL PI IND STRL 7.0 (GLOVE) ×2 IMPLANT
GLOVE BIOGEL PI INDICATOR 7.0 (GLOVE) ×2
GLOVE ECLIPSE 6.5 STRL STRAW (GLOVE) ×2 IMPLANT
GOWN PREVENTION PLUS LG XLONG (DISPOSABLE) ×4 IMPLANT
PACK HYSTEROSCOPY LF (CUSTOM PROCEDURE TRAY) ×2 IMPLANT
TOWEL OR 17X24 6PK STRL BLUE (TOWEL DISPOSABLE) ×4 IMPLANT
WATER STERILE IRR 1000ML POUR (IV SOLUTION) ×2 IMPLANT

## 2011-03-11 NOTE — Transfer of Care (Signed)
Immediate Anesthesia Transfer of Care Note  Patient: Morgan [name]  Procedure(s) Performed:  DILATATION & CURETTAGE/HYSTEROSCOPY WITH RESECTOSCOPE - polyp resection  Patient Location: PACU  Anesthesia Type: General  Level of Consciousness: awake, alert , oriented and sedated  Airway & Oxygen Therapy: Patient Spontanous Breathing and Patient connected to nasal cannula oxygen  Post-op Assessment: Report given to PACU RN, Post -op Vital signs reviewed and stable and Patient moving all extremities  Post vital signs: Reviewed and stable  Complications: No apparent anesthesia complications

## 2011-03-11 NOTE — Op Note (Signed)
Preoperative diagnosis postmenopausal bleeding endometrial polyp seen on sonohysterogram Postoperative diagnosis same path pending Procedure hysteroscopic resection of endometrial polyp and D & C Surgeon Dr. Aram Beecham Romine Anesthesia Gen. by LMA Estimated blood loss minimal Glycine deficit 80 cc Procedure: The patient was taken to the operating room and after induction of adequate anesthesia by LMA was placed in the dorsolithotomy position and prepped and draped in the usual fashion.  A posterior weighted and anterior sims retractor were placed and the cervix was grasped on its anterior lip with a single-tooth tenaculum.  The cervix was dilated to a #31 Pratt.  The uterus sounded to 8 cm.  The operative hysteroscope with a single loop cautery was introduced without difficulty.  Glycine was used as the distention medium.  Hysteroscopy revealed there to be several large polyps in the fundus.  Photographic documentation was taken.  Single loop cautery was used to excise the polyps until the cavity was clean.  Photographic documentation was taken of the clean cavity.  The scope was withdrawn.  Gentle sharp curettage was done, and the specimen was sent to pathology with the polyps.  The instruments removed and the vagina and the procedure was terminated.  The patient was taken to the recovery room in satisfactory condition.  Sponge needle counts were correct x3.  It should be noted that prior to the beginning of the procedure a paracervical block was instituted by injecting 10 cc of 1% Xylocaine at each of 3 and 9:00 at the cervix.

## 2011-03-11 NOTE — Anesthesia Postprocedure Evaluation (Signed)
  Anesthesia Post-op Note  Patient: Morgan [name]  Procedure(s) Performed:  DILATATION & CURETTAGE/HYSTEROSCOPY WITH RESECTOSCOPE - polyp resection  Patient Location: PACU  Anesthesia Type: General  Level of Consciousness: awake, alert  and oriented  Airway and Oxygen Therapy: Patient Spontanous Breathing  Post-op Pain: none  Post-op Assessment: Post-op Vital signs reviewed, Patient's Cardiovascular Status Stable, Respiratory Function Stable, Patent Airway, No signs of Nausea or vomiting and Pain level controlled  Post-op Vital Signs: Reviewed and stable  Complications: No apparent anesthesia complications

## 2011-03-11 NOTE — Anesthesia Preprocedure Evaluation (Addendum)
Anesthesia Evaluation  Name, MR# and DOB Patient awake  General Assessment Comment  Reviewed: Allergy & Precautions, H&P , NPO status   Airway Mallampati: I TM Distance: >3 FB Neck ROM: full    Dental No notable dental hx. (+) Teeth Intact and Upper Dentures   Pulmonary    pulmonary exam normalPulmonary Exam Normal     Cardiovascular regular Normal    Neuro/Psych Negative Neurological ROS    GI/Hepatic/Renal negative GI ROS  negative Liver ROS  negative Renal ROS        Endo/Other  Negative Endocrine ROS (+)      Abdominal   Musculoskeletal   Hematology negative hematology ROS (+)   Peds  Reproductive/Obstetrics negative OB ROS    Anesthesia Other Findings             Anesthesia Physical Anesthesia Plan  ASA: II  Anesthesia Plan: General   Post-op Pain Management:    Induction:   Airway Management Planned:   Additional Equipment:   Intra-op Plan:   Post-operative Plan:   Informed Consent: I have reviewed the patients History and Physical, chart, labs and discussed the procedure including the risks, benefits and alternatives for the proposed anesthesia with the patient or authorized representative who has indicated his/her understanding and acceptance.     Plan Discussed with: Anesthesiologist  Anesthesia Plan Comments:         Anesthesia Quick Evaluation

## 2011-03-11 NOTE — Interval H&P Note (Signed)
No changes or additions to H & P.

## 2011-08-01 ENCOUNTER — Other Ambulatory Visit (INDEPENDENT_AMBULATORY_CARE_PROVIDER_SITE_OTHER): Payer: Medicare Other

## 2011-08-01 DIAGNOSIS — E785 Hyperlipidemia, unspecified: Secondary | ICD-10-CM

## 2011-08-01 LAB — LIPID PANEL
LDL Cholesterol: 100 mg/dL — ABNORMAL HIGH (ref 0–99)
Total CHOL/HDL Ratio: 3
VLDL: 13.2 mg/dL (ref 0.0–40.0)

## 2011-08-01 LAB — AST: AST: 18 U/L (ref 0–37)

## 2011-08-01 LAB — ALT: ALT: 18 U/L (ref 0–35)

## 2011-08-09 ENCOUNTER — Ambulatory Visit: Payer: BC Managed Care – PPO | Admitting: Family Medicine

## 2011-08-13 ENCOUNTER — Encounter: Payer: Self-pay | Admitting: Family Medicine

## 2011-08-14 ENCOUNTER — Encounter: Payer: Self-pay | Admitting: *Deleted

## 2011-08-27 ENCOUNTER — Ambulatory Visit (INDEPENDENT_AMBULATORY_CARE_PROVIDER_SITE_OTHER): Payer: Medicare Other | Admitting: Family Medicine

## 2011-08-27 ENCOUNTER — Encounter: Payer: Self-pay | Admitting: Family Medicine

## 2011-08-27 VITALS — BP 120/70 | HR 52 | Temp 98.0°F | Ht 65.5 in | Wt 170.2 lb

## 2011-08-27 DIAGNOSIS — E669 Obesity, unspecified: Secondary | ICD-10-CM

## 2011-08-27 DIAGNOSIS — E785 Hyperlipidemia, unspecified: Secondary | ICD-10-CM

## 2011-08-27 NOTE — Patient Instructions (Signed)
Cholesterol is very good - continue current medicine and diet  Keep working on healthy diet  Add exercise gradually to 5 days per week-this is very important for overall health  You need a flu shot- go to a pharmacy and get one  Follow up in 6 months for annual exam with labs prior

## 2011-08-27 NOTE — Progress Notes (Signed)
Subjective:    Patient ID: Morgan [name], female    DOB: 1946/05/31, 66 y.o.   MRN: [id number]  HPI Here for f/u of hyperlipidemia and obesity  Wt is down 10 lb with bmi 27 Diet- is cutting back on portion size and not eating snacks/ chips/ or bread  Exercise-- is not exercising - just active (does not like it )   bp is up a bit from normal today at 144/84  No supplements    Lipids are well controlled on pravastatin and diet Lab Results  Component Value Date   CHOL 168 08/01/2011   CHOL 199 12/27/2010   CHOL 215* 10/09/2010   Lab Results  Component Value Date   HDL 54.70 08/01/2011   HDL 40.98 12/27/2010   HDL 48.20 10/09/2010   Lab Results  Component Value Date   LDLCALC 100* 08/01/2011   LDLCALC 130* 12/27/2010   Lab Results  Component Value Date   TRIG 66.0 08/01/2011   TRIG 66.0 12/27/2010   TRIG 81.0 10/09/2010   Lab Results  Component Value Date   CHOLHDL 3 08/01/2011   CHOLHDL 4 12/27/2010   CHOLHDL 4 10/09/2010   Lab Results  Component Value Date   LDLDIRECT 148.8 10/09/2010   LDLDIRECT 166.4 08/09/2010   LDLDIRECT 144.5 01/29/2008   is at goal  Diet-- is really staying away from fried and fatty foods    Flu shot - did not get    Lab Results  Component Value Date   TSH 0.16* 05/02/2010   no longer on thyroid med   Patient Active Problem List  Diagnoses  . HYPOTHYROIDISM  . HYPERLIPIDEMIA  . CARPAL TUNNEL SYNDROME, BILATERAL  . TINNITUS  . BRADYCARDIA  . HEMORRHOIDS, INTERNAL  . GERD  . DYSPEPSIA  . DIVERTICULOSIS OF COLON  . IBS  . OVERACTIVE BLADDER  . POSTMENOPAUSAL STATUS  . DERMATITIS, SEBORRHEIC NEC  . ECZEMA  . HAIR LOSS  . CONNECTIVE TISSUE DISEASE  . OSTEOARTHRITIS  . NONSPECIFIC ABNORMAL TOXICOLOGICAL FINDINGS  . ALLERGY  . HEPATITIS, HX OF  . COLONIC POLYPS, HYPERPLASTIC, HX OF  . DDD (degenerative disc disease), cervical  . DDD (degenerative disc disease), lumbar  . Spinal stenosis  . Overweight  . Endometrial polyp   Past  Medical History  Diagnosis Date  . HLD (hyperlipidemia)   . Osteoarthritis of cervical spine   . Connective tissue disease   . DDD (degenerative disc disease), cervical   . DDD (degenerative disc disease), lumbar   . Spinal stenosis   . Obesity   . Endometrial polyp     not resolved with D&C (following)  . Bradycardia   . PONV (postoperative nausea and vomiting)   . GERD (gastroesophageal reflux disease)     gas/belching issues-was on protonix for short time-not since 6 months  . Mixed connective tissue disease diagnosed 5 years ago    no problems since diagnosis   Past Surgical History  Procedure Date  . Hand surgery     carpal tunnel release bilateral Dr. Teressa Senter  . Tonsillectomy   . Appendectomy   . Dilation and curettage of uterus     fibroids  . Dilation and curettage of uterus 01/2005    endometrial polyp  . Endometrial biopsy 2008    fibroid  . Tubal ligation     1973   History  Substance Use Topics  . Smoking status: Former Smoker    Quit date: 07/22/1965  . Smokeless tobacco: Not on file  .  Alcohol Use: No   Family History  Problem Relation Age of Onset  . Coronary artery disease Father   . Heart attack Father     deceased at 26  . Diabetes Maternal Grandmother   . Diabetes Cousin   . Goiter Maternal Grandmother   . Cancer Grandchild     rare type (dying at age 57)   Allergies  Allergen Reactions  . Codeine Nausea And Vomiting  . Hydrocodone-Acetaminophen     REACTION: reaction not known  . Penicillins Hives   Current Outpatient Prescriptions on File Prior to Visit  Medication Sig Dispense Refill  . Ascorbic Acid 2000 MG TBCR Take 1 tablet by mouth daily.        Marland Kitchen aspirin 81 MG tablet Take 81 mg by mouth daily.       Marland Kitchen estradiol (VIVELLE-DOT) 0.075 MG/24HR Place 1 patch onto the skin twice a week.        . magnesium oxide (MAG-OX) 400 MG tablet Take 400 mg by mouth daily.        . Multiple Vitamins-Minerals (HAIR VITAMINS PO) 1 tablet by mouth  daily       . pravastatin (PRAVACHOL) 10 MG tablet Take 0.5 tablets (5 mg total) by mouth every evening.  15 tablet  11  . PRESCRIPTION MEDICATION        . progesterone (PROMETRIUM) 100 MG capsule Take 100 mg by mouth daily.        Marland Kitchen acetaminophen (TYLENOL) 500 MG tablet Take 500 mg by mouth every 6 (six) hours as needed. For pain       . Probiotic Product (SOLUBLE FIBER/PROBIOTICS PO) OTC take as directed.          Review of Systems Review of Systems  Constitutional: Negative for fever, appetite change, fatigue and unexpected weight change.  Eyes: Negative for pain and visual disturbance.  Respiratory: Negative for cough and shortness of breath.   Cardiovascular: Negative for cp or palpitations    Gastrointestinal: Negative for nausea, diarrhea and constipation.  Genitourinary: Negative for urgency and frequency.  Skin: Negative for pallor or rash   Neurological: Negative for weakness, light-headedness, numbness and headaches.  Hematological: Negative for adenopathy. Does not bruise/bleed easily.  Psychiatric/Behavioral: Negative for dysphoric mood. The patient is not nervous/anxious.          Objective:   Physical Exam  Constitutional: She appears well-developed and well-nourished. No distress.       Obese and well app  HENT:  Head: Normocephalic and atraumatic.  Mouth/Throat: Oropharynx is clear and moist.  Eyes: Conjunctivae and EOM are normal. Pupils are equal, round, and reactive to light. No scleral icterus.  Neck: Normal range of motion. Neck supple. No JVD present. Carotid bruit is not present. No thyromegaly present.  Cardiovascular: Normal rate, regular rhythm, normal heart sounds and intact distal pulses.  Exam reveals no gallop.   Pulmonary/Chest: Effort normal and breath sounds normal. No respiratory distress. She has no wheezes.  Abdominal: Soft. Bowel sounds are normal. She exhibits no distension, no abdominal bruit and no mass. There is no tenderness.    Lymphadenopathy:    She has no cervical adenopathy.  Neurological: She is alert. She has normal reflexes. She exhibits normal muscle tone.  Skin: Skin is warm and dry. No rash noted. No erythema. No pallor.  Psychiatric: She has a normal mood and affect.          Assessment & Plan:

## 2011-08-27 NOTE — Assessment & Plan Note (Signed)
Improved with better diet and pravachol  Commended Disc goals for lipids and reasons to control them Rev labs with pt Rev low sat fat diet in detail

## 2011-08-27 NOTE — Assessment & Plan Note (Signed)
Improved with wt loss Commended on that  Discussed how this problem influences overall health and the risks it imposes  Reviewed plan for weight loss with lower calorie diet (via better food choices and also portion control or program like weight watchers) and exercise building up to or more than 30 minutes 5 days per week including some aerobic activity

## 2011-10-09 ENCOUNTER — Ambulatory Visit (AMBULATORY_SURGERY_CENTER): Payer: Medicare Other | Admitting: *Deleted

## 2011-10-09 VITALS — Ht 65.5 in | Wt 168.9 lb

## 2011-10-09 DIAGNOSIS — Z1211 Encounter for screening for malignant neoplasm of colon: Secondary | ICD-10-CM

## 2011-10-09 DIAGNOSIS — Z86010 Personal history of colonic polyps: Secondary | ICD-10-CM

## 2011-10-09 MED ORDER — PEG-KCL-NACL-NASULF-NA ASC-C 100 G PO SOLR: ORAL | Status: DC

## 2011-10-23 ENCOUNTER — Encounter: Payer: Medicare Other | Admitting: Internal Medicine

## 2012-01-29 ENCOUNTER — Encounter: Payer: Self-pay | Admitting: Internal Medicine

## 2012-01-29 ENCOUNTER — Other Ambulatory Visit: Payer: Self-pay | Admitting: Family Medicine

## 2012-01-30 NOTE — Telephone Encounter (Signed)
Will refill electronically  

## 2012-01-30 NOTE — Telephone Encounter (Signed)
Last OV was 08/27/11. Ok to refill? 

## 2012-01-30 NOTE — Telephone Encounter (Signed)
Last OV was 08/27/11. Ok to refill?

## 2012-03-02 ENCOUNTER — Telehealth: Payer: Self-pay | Admitting: Internal Medicine

## 2012-03-02 NOTE — Telephone Encounter (Signed)
Spoke with patient and discussed the "pill" prep and warnings associated with it. She will take the Moviprep.

## 2012-04-01 ENCOUNTER — Ambulatory Visit (AMBULATORY_SURGERY_CENTER): Payer: Medicare Other

## 2012-04-01 VITALS — Ht 65.0 in | Wt 173.2 lb

## 2012-04-01 DIAGNOSIS — Z1211 Encounter for screening for malignant neoplasm of colon: Secondary | ICD-10-CM

## 2012-04-01 DIAGNOSIS — Z86010 Personal history of colonic polyps: Secondary | ICD-10-CM

## 2012-04-01 MED ORDER — MOVIPREP 100 G PO SOLR: ORAL | Status: DC

## 2012-04-15 ENCOUNTER — Encounter: Payer: Self-pay | Admitting: Internal Medicine

## 2012-04-15 ENCOUNTER — Ambulatory Visit (AMBULATORY_SURGERY_CENTER): Payer: Medicare Other | Admitting: Internal Medicine

## 2012-04-15 VITALS — BP 150/73 | HR 46 | Temp 98.4°F | Resp 16 | Ht 65.0 in | Wt 173.0 lb

## 2012-04-15 DIAGNOSIS — Z860100 Personal history of colon polyps, unspecified: Secondary | ICD-10-CM

## 2012-04-15 DIAGNOSIS — Z1211 Encounter for screening for malignant neoplasm of colon: Secondary | ICD-10-CM

## 2012-04-15 DIAGNOSIS — D126 Benign neoplasm of colon, unspecified: Secondary | ICD-10-CM

## 2012-04-15 MED ORDER — SODIUM CHLORIDE 0.9 % IV SOLN: 500.0000 mL | INTRAVENOUS | Status: DC

## 2012-04-15 NOTE — Patient Instructions (Addendum)
YOU HAD AN ENDOSCOPIC PROCEDURE TODAY AT THE [facility]: Refer to the procedure report that was given to you for any specific questions about what was found during the examination.  If the procedure report does not answer your questions, please call your gastroenterologist to clarify.  If you requested that your care partner not be given the details of your procedure findings, then the procedure report has been included in a sealed envelope for you to review at your convenience later.  YOU SHOULD EXPECT: Some feelings of bloating in the abdomen. Passage of more gas than usual.  Walking can help get rid of the air that was put into your GI tract during the procedure and reduce the bloating. If you had a lower endoscopy (such as a colonoscopy or flexible sigmoidoscopy) you may notice spotting of blood in your stool or on the toilet paper. If you underwent a bowel prep for your procedure, then you may not have a normal bowel movement for a few days.  DIET: Your first meal following the procedure should be a light meal and then it is ok to progress to your normal diet.  A half-sandwich or bowl of soup is an example of a good first meal.  Heavy or fried foods are harder to digest and may make you feel nauseous or bloated.  Likewise meals heavy in dairy and vegetables can cause extra gas to form and this can also increase the bloating.  Drink plenty of fluids but you should avoid alcoholic beverages for 24 hours.  ACTIVITY: Your care partner should take you home directly after the procedure.  You should plan to take it easy, moving slowly for the rest of the day.  You can resume normal activity the day after the procedure however you should NOT DRIVE or use heavy machinery for 24 hours (because of the sedation medicines used during the test).    SYMPTOMS TO REPORT IMMEDIATELY: A gastroenterologist can be reached at any hour.  During normal business hours, 8:30 AM to 5:00 PM Monday through Friday,  call [phone].  After hours and on weekends, please call the GI answering service at [phone] who will take a message and have the physician on call contact you.   Following lower endoscopy (colonoscopy or flexible sigmoidoscopy):  Excessive amounts of blood in the stool  Significant tenderness or worsening of abdominal pains  Swelling of the abdomen that is new, acute  Fever of 100F or higher    FOLLOW UP: If any biopsies were taken you will be contacted by phone or by letter within the next 1-3 weeks.  Call your gastroenterologist if you have not heard about the biopsies in 3 weeks.  Our staff will call the home number listed on your records the next business day following your procedure to check on you and address any questions or concerns that you may have at that time regarding the information given to you following your procedure. This is a courtesy call and so if there is no answer at the home number and we have not heard from you through the emergency physician on call, we will assume that you have returned to your regular daily activities without incident.  SIGNATURES/CONFIDENTIALITY: You and/or your care partner have signed paperwork which will be entered into your electronic medical record.  These signatures attest to the fact that that the information above on your After Visit Summary has been reviewed and is understood.  Full responsibility of the confidentiality   of this discharge information lies with you and/or your care-partner.     INFORMATION ON POLYPS, DIVERTICULOSIS, & HIGH FIBER DIET GIVEN TO YOU TODAY 

## 2012-04-15 NOTE — Progress Notes (Signed)
The pt tolerated the colonoscopy very well. Maw   

## 2012-04-15 NOTE — Progress Notes (Signed)
14:11 Iline Oven, CRNA hung 2nd bag of normal saline 0.9% 500 ml. Maw

## 2012-04-15 NOTE — Progress Notes (Signed)
Patient did not experience any of the following events: a burn prior to discharge; a fall within the facility; wrong site/side/patient/procedure/implant event; or a hospital transfer or hospital admission upon discharge from the facility. (G8907) Patient did not have preoperative order for IV antibiotic SSI prophylaxis. (G8918)  

## 2012-04-15 NOTE — Op Note (Signed)
[facility] [address] [place], [address]   COLONOSCOPY PROCEDURE REPORT  PATIENT: Morgan [name], Morgan [name]  MR#: [id number] BIRTHDATE: March 30, 1946 , 66  yrs. old GENDER: Female ENDOSCOPIST: Hart Carwin, MD REFERRED BY:  Judy Pimple, M.D. PROCEDURE DATE:  04/15/2012 PROCEDURE:   Colonoscopy with snare polypectomy ASA CLASS:   Class II INDICATIONS:patient's personal history of colon polyps and hyperplastic polyp removed in 2004. MEDICATIONS: MAC sedation, administered by CRNA and Propofol (Diprivan) 300 mg IV  DESCRIPTION OF PROCEDURE:   After the risks and benefits and of the procedure were explained, informed consent was obtained.  A digital rectal exam revealed no abnormalities of the rectum.    The LB [id number]  endoscope was introduced through the anus and advanced to the cecum, which was identified by both the appendix and ileocecal valve .  The quality of the prep was good, using MoviPrep .  The instrument was then slowly withdrawn as the colon was fully examined.     COLON FINDINGS: A medium sized smooth sessile polyp, measuring 1 cm in size, was found at the cecum.  A polypectomy was performed with a cold snare.  The resection was complete and the polyp tissue was completely retrieved.   Mild diverticulosis was noted. Retroflexed views revealed no abnormalities.     The scope was then withdrawn from the patient and the procedure completed.  COMPLICATIONS: There were no complications. ENDOSCOPIC IMPRESSION: 1.   Medium sized sessile polyp, measuring 1 cm in size, was found at the cecum; polypectomy was performed with a cold snare 2.   Mild diverticulosis was noted  RECOMMENDATIONS: 1.  await biopsy results 2.   high fiber diet   REPEAT EXAM: for Colonoscopy. 5010 years depending on type of the polyp  cc:  _______________________________ eSigned:  Hart Carwin, MD 04/15/2012 2:20 PM     PATIENT NAME:  [name], [name] MR#:  [id number]

## 2012-04-16 ENCOUNTER — Telehealth: Payer: Self-pay | Admitting: *Deleted

## 2012-04-16 NOTE — Telephone Encounter (Signed)
  Follow up Call-  Call back number 04/15/2012  Post procedure Call Back phone  # [phone]  Permission to leave phone message Yes     Patient questions:  Do you have a fever, pain , or abdominal swelling? no Pain Score  0 *  Have you tolerated food without any problems? yes  Have you been able to return to your normal activities? yes  Do you have any questions about your discharge instructions: Diet   no Medications  no Follow up visit  no  Do you have questions or concerns about your Care? no  Actions: * If pain score is 4 or above: No action needed, pain <4.

## 2012-04-22 ENCOUNTER — Encounter: Payer: Self-pay | Admitting: Internal Medicine

## 2012-04-23 ENCOUNTER — Encounter: Payer: Self-pay | Admitting: *Deleted

## 2012-09-08 ENCOUNTER — Other Ambulatory Visit: Payer: Medicare Other

## 2012-09-09 ENCOUNTER — Telehealth: Payer: Self-pay | Admitting: Family Medicine

## 2012-09-09 DIAGNOSIS — K219 Gastro-esophageal reflux disease without esophagitis: Secondary | ICD-10-CM

## 2012-09-09 DIAGNOSIS — E039 Hypothyroidism, unspecified: Secondary | ICD-10-CM

## 2012-09-09 DIAGNOSIS — E785 Hyperlipidemia, unspecified: Secondary | ICD-10-CM

## 2012-09-09 NOTE — Telephone Encounter (Signed)
Message copied by Judy Pimple on Wed Sep 09, 2012  8:42 PM ------      Message from: Alvina Chou      Created: Tue Sep 01, 2012  4:47 PM      Regarding: Lab orders for Thursday, 2.20.14       Patient is scheduled for CPX labs, please order future labs, Thanks , Terri       ------

## 2012-09-10 ENCOUNTER — Other Ambulatory Visit (INDEPENDENT_AMBULATORY_CARE_PROVIDER_SITE_OTHER): Payer: Medicare Other

## 2012-09-10 DIAGNOSIS — K219 Gastro-esophageal reflux disease without esophagitis: Secondary | ICD-10-CM

## 2012-09-10 DIAGNOSIS — E039 Hypothyroidism, unspecified: Secondary | ICD-10-CM

## 2012-09-10 DIAGNOSIS — E785 Hyperlipidemia, unspecified: Secondary | ICD-10-CM

## 2012-09-10 LAB — CBC WITH DIFFERENTIAL/PLATELET
Basophils Absolute: 0 10*3/uL (ref 0.0–0.1)
Eosinophils Relative: 2 % (ref 0.0–5.0)
HCT: 39.6 % (ref 36.0–46.0)
Lymphocytes Relative: 26.1 % (ref 12.0–46.0)
Lymphs Abs: 1.5 10*3/uL (ref 0.7–4.0)
Monocytes Relative: 14.4 % — ABNORMAL HIGH (ref 3.0–12.0)
Neutrophils Relative %: 57.2 % (ref 43.0–77.0)
Platelets: 195 10*3/uL (ref 150.0–400.0)
RDW: 13 % (ref 11.5–14.6)
WBC: 5.6 10*3/uL (ref 4.5–10.5)

## 2012-09-10 LAB — COMPREHENSIVE METABOLIC PANEL
ALT: 19 U/L (ref 0–35)
Albumin: 4 g/dL (ref 3.5–5.2)
CO2: 29 mEq/L (ref 19–32)
GFR: 87.51 mL/min (ref >60.00)
Potassium: 4.1 mEq/L (ref 3.5–5.1)
Sodium: 140 mEq/L (ref 135–145)
Total Bilirubin: 0.8 mg/dL (ref 0.3–1.2)
Total Protein: 6.2 g/dL (ref 6.0–8.3)

## 2012-09-10 LAB — LIPID PANEL
LDL Cholesterol: 92 mg/dL (ref 0–99)
VLDL: 17.2 mg/dL (ref 0.0–40.0)

## 2012-09-10 LAB — TSH: TSH: 0.3 u[IU]/mL — ABNORMAL LOW (ref 0.35–5.50)

## 2012-09-15 ENCOUNTER — Ambulatory Visit (INDEPENDENT_AMBULATORY_CARE_PROVIDER_SITE_OTHER): Payer: Medicare Other | Admitting: Family Medicine

## 2012-09-15 ENCOUNTER — Encounter: Payer: Self-pay | Admitting: Family Medicine

## 2012-09-15 VITALS — BP 116/74 | HR 54 | Temp 98.4°F | Ht 65.0 in | Wt 179.8 lb

## 2012-09-15 DIAGNOSIS — Z23 Encounter for immunization: Secondary | ICD-10-CM

## 2012-09-15 DIAGNOSIS — N951 Menopausal and female climacteric states: Secondary | ICD-10-CM

## 2012-09-15 DIAGNOSIS — E039 Hypothyroidism, unspecified: Secondary | ICD-10-CM

## 2012-09-15 DIAGNOSIS — E785 Hyperlipidemia, unspecified: Secondary | ICD-10-CM

## 2012-09-15 MED ORDER — PRAVASTATIN SODIUM 10 MG PO TABS: 5.0000 mg | ORAL_TABLET | Freq: Every day | ORAL | Status: DC

## 2012-09-15 NOTE — Assessment & Plan Note (Signed)
Controlled with low dose pravastatin - enc to continue it Disc goals for lipids and reasons to control them Rev labs with pt Rev low sat fat diet in detail

## 2012-09-15 NOTE — Progress Notes (Signed)
Subjective:    Patient ID: Morgan [name], female    DOB: 1946/04/22, 67 y.o.   MRN: [id number]  HPI Here for check up of chronic medical conditions and to review health mt list   Wt is up 6 lb with bmi of 29 (10 from last year) Is eating a healthy diet Not getting any exercise   She snores  More congested at night - vics helps  This adds to snoring   Flu vaccine- had one in the fall   Pneumovax- wants one today  mammo 1/13 Self exam-no lumps or changes  Thinks she needs a dexa   Gyn exam 9/13, and pap 1/11  She sees Shirlyn Goltz for gyn  No more bleeding and has had endometrial bx and polyps removed  She is on est patch and prometrium  She is completely disabled by menopausal symptoms off her HRT and cannot get off of them She is aware of risks of breast ca and blood clots and vascular risks    colonosc 9/13- 5 year recall Hx of hyperplastic polyps   tsh is low  Hypothyroid- sees Dr Patrecia Pace  Lab Results  Component Value Date   CHOL 158 09/10/2012   CHOL 168 08/01/2011   CHOL 199 12/27/2010   Lab Results  Component Value Date   HDL 49.30 09/10/2012   HDL 08.65 08/01/2011   HDL 78.46 12/27/2010   Lab Results  Component Value Date   LDLCALC 92 09/10/2012   LDLCALC 100* 08/01/2011   LDLCALC 130* 12/27/2010   Lab Results  Component Value Date   TRIG 86.0 09/10/2012   TRIG 66.0 08/01/2011   TRIG 66.0 12/27/2010   Lab Results  Component Value Date   CHOLHDL 3 09/10/2012   CHOLHDL 3 08/01/2011   CHOLHDL 4 12/27/2010   Lab Results  Component Value Date   LDLDIRECT 148.8 10/09/2010   LDLDIRECT 166.4 08/09/2010   LDLDIRECT 144.5 01/29/2008   is on small dose of statin and is at goal   Fall hx-none   Mood- has been pretty good     Patient Active Problem List  Diagnosis  . HYPOTHYROIDISM  . HYPERLIPIDEMIA  . CARPAL TUNNEL SYNDROME, BILATERAL  . TINNITUS  . BRADYCARDIA  . HEMORRHOIDS, INTERNAL  . GERD  . DYSPEPSIA  . DIVERTICULOSIS OF COLON  . IBS  .  OVERACTIVE BLADDER  . POSTMENOPAUSAL STATUS  . DERMATITIS, SEBORRHEIC NEC  . ECZEMA  . HAIR LOSS  . CONNECTIVE TISSUE DISEASE  . OSTEOARTHRITIS  . NONSPECIFIC ABNORMAL TOXICOLOGICAL FINDINGS  . ALLERGY  . HEPATITIS, HX OF  . COLONIC POLYPS, HYPERPLASTIC, HX OF  . DDD (degenerative disc disease), cervical  . DDD (degenerative disc disease), lumbar  . Spinal stenosis  . Overweight  . Endometrial polyp   Past Medical History  Diagnosis Date  . HLD (hyperlipidemia)   . Osteoarthritis of cervical spine   . Connective tissue disease   . DDD (degenerative disc disease), cervical   . DDD (degenerative disc disease), lumbar   . Spinal stenosis   . Obesity   . Endometrial polyp     not resolved with D&C (following)  . Bradycardia   . PONV (postoperative nausea and vomiting)   . GERD (gastroesophageal reflux disease)     gas/belching issues-was on protonix for short time-not since 6 months  . Mixed connective tissue disease diagnosed 5 years ago    no problems since diagnosis  . Allergy    Past Surgical History  Procedure  Laterality Date  . Hand surgery      carpal tunnel release bilateral Dr. Teressa Senter  . Tonsillectomy    . Appendectomy    . Dilation and curettage of uterus      fibroids  . Dilation and curettage of uterus  01/2005    endometrial polyp  . Endometrial biopsy  2008    fibroid  . Tubal ligation      1973  . Colonoscopy    . Upper gastrointestinal endoscopy     History  Substance Use Topics  . Smoking status: Former Smoker    Types: Cigarettes    Quit date: 07/22/1965  . Smokeless tobacco: Never Used  . Alcohol Use: No   Family History  Problem Relation Age of Onset  . Coronary artery disease Father   . Heart attack Father     deceased at 24  . Diabetes Maternal Grandmother   . Goiter Maternal Grandmother   . Diabetes Cousin   . Cancer Grandchild     rare type (dying at age 54)  . Colon cancer Neg Hx   . Esophageal cancer Neg Hx   . Stomach  cancer Neg Hx   . Rectal cancer Neg Hx    Allergies  Allergen Reactions  . Codeine Nausea And Vomiting  . Hydrocodone-Acetaminophen     REACTION: reaction not known  . Penicillins Hives   Current Outpatient Prescriptions on File Prior to Visit  Medication Sig Dispense Refill  . acetaminophen (TYLENOL) 500 MG tablet Take 500 mg by mouth every 6 (six) hours as needed. For pain       . aspirin 81 MG tablet Take 81 mg by mouth daily. 3 times a week      . estradiol (VIVELLE-DOT) 0.075 MG/24HR Place 1 patch onto the skin once a week.       . levothyroxine (SYNTHROID, LEVOTHROID) 50 MCG tablet Take by mouth. Take 50 mcg 3 times a week,25 mcg 4 times a week      . Multiple Vitamins-Minerals (HAIR/SKIN/NAILS) TABS Take by mouth daily.      . pravastatin (PRAVACHOL) 10 MG tablet TAKE 1/2 TABLET BY MOUTH EVERY EVENING  AS DIRECTED  15 tablet  11  . progesterone (PROMETRIUM) 100 MG capsule Take 100 mg by mouth daily.        . valACYclovir (VALTREX) 1000 MG tablet Take 1 tablet by mouth Once daily as needed.       No current facility-administered medications on file prior to visit.    Review of Systems Review of Systems  Constitutional: Negative for fever, appetite change, and unexpected weight change.  Eyes: Negative for pain and visual disturbance.  ENt pos for night time nasal congestion  Respiratory: Negative for cough and shortness of breath.   Cardiovascular: Negative for cp or palpitations    Gastrointestinal: Negative for nausea, diarrhea and constipation.  Genitourinary: Negative for urgency and frequency.  Skin: Negative for pallor or rash   Neurological: Negative for weakness, light-headedness, numbness and headaches.  Hematological: Negative for adenopathy. Does not bruise/bleed easily.  Psychiatric/Behavioral: Negative for dysphoric mood. The patient is not nervous/anxious.         Objective:   Physical Exam  Constitutional: She appears well-developed and well-nourished. No  distress.  HENT:  Head: Normocephalic and atraumatic.  Right Ear: External ear normal.  Left Ear: External ear normal.  Nose: Nose normal.  Mouth/Throat: Oropharynx is clear and moist.  Eyes: Conjunctivae and EOM are normal. Pupils are equal,  round, and reactive to light. Right eye exhibits no discharge. Left eye exhibits no discharge. No scleral icterus.  Neck: Normal range of motion. Neck supple. No JVD present. Carotid bruit is not present. No thyromegaly present.  Cardiovascular: Normal rate, regular rhythm, normal heart sounds and intact distal pulses.  Exam reveals no gallop.   Pulmonary/Chest: Breath sounds normal. No respiratory distress. She has no wheezes. She has no rales.  Abdominal: Soft. Bowel sounds are normal. She exhibits no distension, no abdominal bruit and no mass. There is no tenderness.  Musculoskeletal: She exhibits no edema and no tenderness.  Lymphadenopathy:    She has no cervical adenopathy.  Neurological: She is alert. She has normal reflexes. No cranial nerve deficit. She exhibits normal muscle tone. Coordination normal.  Skin: Skin is warm and dry. No rash noted. No erythema. No pallor.  Psychiatric: She has a normal mood and affect.          Assessment & Plan:

## 2012-09-15 NOTE — Assessment & Plan Note (Signed)
Schedule dexa On thyroid repl Disc safety and vit suppl

## 2012-09-15 NOTE — Patient Instructions (Addendum)
Pneumonia vaccine today Work on weight loss  Aim for 30 minutes of exercise five days per week

## 2012-10-02 ENCOUNTER — Encounter: Payer: Self-pay | Admitting: Family Medicine

## 2012-10-05 ENCOUNTER — Encounter: Payer: Self-pay | Admitting: *Deleted

## 2012-10-05 ENCOUNTER — Encounter: Payer: Self-pay | Admitting: Family Medicine

## 2012-11-09 ENCOUNTER — Other Ambulatory Visit: Payer: Self-pay | Admitting: Family Medicine

## 2012-11-09 DIAGNOSIS — M549 Dorsalgia, unspecified: Secondary | ICD-10-CM

## 2012-11-09 DIAGNOSIS — G8929 Other chronic pain: Secondary | ICD-10-CM

## 2012-11-10 ENCOUNTER — Ambulatory Visit
Admission: RE | Admit: 2012-11-10 | Discharge: 2012-11-10 | Disposition: A | Discharge status: Home / Self Care | Payer: Medicare Other | Source: Ambulatory Visit | Attending: Family Medicine | Admitting: Family Medicine

## 2012-11-10 DIAGNOSIS — G8929 Other chronic pain: Secondary | ICD-10-CM

## 2012-12-28 ENCOUNTER — Other Ambulatory Visit: Payer: Self-pay | Admitting: Nurse Practitioner

## 2012-12-28 ENCOUNTER — Telehealth: Payer: Self-pay | Admitting: *Deleted

## 2012-12-28 NOTE — Telephone Encounter (Signed)
OK for refill until she comes in - then we will discuss discontinuation of med's and tapering regimen.

## 2012-12-28 NOTE — Telephone Encounter (Signed)
Called patient concerning [employer] tier request form.for Estradiol Patch. patient states wanted to talk to Shirlyn Goltz first before doing all this. AEX appointment given for July 14th  With Shirlyn Goltz, FNP,  To discuss medication. Patient reqeust to refill at her pharmacy enough till sees [address]. Please advise. Fannie Knee    CHART IN YOUR OFFICE.

## 2012-12-29 MED ORDER — ESTRADIOL 0.075 MG/24HR TD PTTW: 1.0000 | MEDICATED_PATCH | TRANSDERMAL | Status: DC

## 2012-12-29 NOTE — Telephone Encounter (Signed)
Patient notified of Rx for Estradiol Patch to be refilled till appt. With Shirlyn Goltz, FNP, in July 14th. Rx sent to [facility] in [place]. Patient request cell # be taken out of information . Prefers home # only.

## 2012-12-31 ENCOUNTER — Telehealth: Payer: Self-pay | Admitting: *Deleted

## 2012-12-31 MED ORDER — ESTRADIOL 0.05 MG/24HR TD PTWK: 1.0000 | MEDICATED_PATCH | TRANSDERMAL | Status: DC

## 2012-12-31 NOTE — Telephone Encounter (Signed)
Fax received from pharmacy stating different med/dose had been escribed.  Per Patty, pt should be on estradiol 0.05.  RX sent.  Pharmacy notified to d/c vivelle dot.

## 2012-12-31 NOTE — Telephone Encounter (Signed)
Approval for Estradiol received. Patient has appt. With Shirlyn Goltz to discuss.

## 2012-12-31 NOTE — Addendum Note (Signed)
Addended by: Luisa Dago on: 12/31/2012 09:08 AM   Modules accepted: Orders

## 2013-01-29 ENCOUNTER — Encounter: Payer: Self-pay | Admitting: *Deleted

## 2013-02-01 ENCOUNTER — Encounter: Payer: Self-pay | Admitting: Nurse Practitioner

## 2013-02-01 ENCOUNTER — Ambulatory Visit (INDEPENDENT_AMBULATORY_CARE_PROVIDER_SITE_OTHER): Payer: MEDICARE | Admitting: Nurse Practitioner

## 2013-02-01 VITALS — BP 116/80 | HR 76 | Resp 12 | Ht 65.0 in | Wt 181.4 lb

## 2013-02-01 DIAGNOSIS — Z01419 Encounter for gynecological examination (general) (routine) without abnormal findings: Secondary | ICD-10-CM

## 2013-02-01 MED ORDER — PROGESTERONE MICRONIZED 100 MG PO CAPS: 100.0000 mg | ORAL_CAPSULE | Freq: Every day | ORAL | Status: DC

## 2013-02-01 MED ORDER — ESTRADIOL 0.05 MG/24HR TD PTWK: 1.0000 | MEDICATED_PATCH | TRANSDERMAL | Status: DC

## 2013-02-01 NOTE — Patient Instructions (Addendum)

## 2013-02-01 NOTE — Progress Notes (Signed)
67 y.o. G3P3 Married Caucasian Fe here for annual exam.  Likes being on HRT and wants to continue.  States she feels better and has tried to come off 3 times in the past and has not felt 'well'.Will be seeing Dr. Ky Barban for problems that she thinks are connective tissue disease.    Patient's last menstrual period was 07/22/1989.          Sexually active: yes  The current method of family planning is tubal ligation.    Exercising: no  The patient does not participate in regular exercise at present. Smoker:  no  Health Maintenance: Pap:  10/01/2010  negative MMG:  1//2014 normal Colonoscopy: 9/25/ 2013 1 polyp repeat in 5 years BMD:   2013 'normal' at PCP TDaP:  PCP maintains tetanus.  Shingles vaccine age 73 Labs: PCP maintains all labs and urine.    reports that she quit smoking about 47 years ago. Her smoking use included Cigarettes. She smoked 0.00 packs per day. She has never used smokeless tobacco. She reports that she does not drink alcohol or use illicit drugs.  Past Medical History  Diagnosis Date  . HLD (hyperlipidemia)   . Osteoarthritis of cervical spine   . Connective tissue disease   . DDD (degenerative disc disease), cervical   . DDD (degenerative disc disease), lumbar   . Spinal stenosis   . Obesity   . Endometrial polyp     not resolved with D&C (following)  . Bradycardia   . PONV (postoperative nausea and vomiting)   . GERD (gastroesophageal reflux disease)     gas/belching issues-was on protonix for short time-not since 6 months  . Mixed connective tissue disease diagnosed 5 years ago    no problems since diagnosis  . Allergy   . Renal calculi     Past Surgical History  Procedure Laterality Date  . Hand surgery      carpal tunnel release bilateral Dr. Teressa Senter  . Tonsillectomy    . Appendectomy    . Dilation and curettage of uterus      fibroids  . Dilation and curettage of uterus  01/2005    endometrial polyp  . Endometrial biopsy  2008    fibroid   . Tubal ligation      1973  . Colonoscopy    . Upper gastrointestinal endoscopy    . Hysteroscopic resection       polyps of PMB    Current Outpatient Prescriptions  Medication Sig Dispense Refill  . Ascorbic Acid (VITAMIN C) 1000 MG tablet Take 1,000 mg by mouth daily.      Marland Kitchen aspirin 81 MG tablet Take 81 mg by mouth daily. 3 times a week      . Clobetasol Propionate Emulsion 0.05 % topical foam       . estradiol (CLIMARA - DOSED IN MG/24 HR) 0.05 mg/24hr Place 1 patch (0.05 mg total) onto the skin once a week.  4 patch  0  . levothyroxine (SYNTHROID, LEVOTHROID) 50 MCG tablet Take by mouth. Take 50 mcg 3 times a week,25 mcg 4 times a week      . pravastatin (PRAVACHOL) 10 MG tablet Take 0.5 tablets (5 mg total) by mouth daily.  15 tablet  11  . progesterone (PROMETRIUM) 100 MG capsule Take 100 mg by mouth daily.        . valACYclovir (VALTREX) 1000 MG tablet Take 1 tablet by mouth Once daily as needed.       No  current facility-administered medications for this visit.    Family History  Problem Relation Age of Onset  . Coronary artery disease Father   . Heart attack Father     deceased at 28  . Diabetes Maternal Grandmother   . Goiter Maternal Grandmother   . Diabetes Cousin   . Cancer Grandchild     rare type (dying at age 88)  . Colon cancer Neg Hx   . Esophageal cancer Neg Hx   . Stomach cancer Neg Hx   . Rectal cancer Neg Hx     ROS:  Pertinent items are noted in HPI.  Otherwise, a comprehensive ROS was negative.  Exam:   BP 116/80  Pulse 76  Resp 12  Ht 5\' 5"  (1.651 m)  Wt 181 lb 6.4 oz (82.283 kg)  BMI 30.19 kg/m2  LMP 07/22/1989 Height: 5\' 5"  (165.1 cm)  Ht Readings from Last 3 Encounters:  02/01/13 5\' 5"  (1.651 m)  09/15/12 5\' 5"  (1.651 m)  04/15/12 5\' 5"  (1.651 m)    General appearance: alert, cooperative and appears stated age Head: Normocephalic, without obvious abnormality, atraumatic Neck: no adenopathy, supple, symmetrical, trachea midline and  thyroid normal to inspection and palpation Lungs: clear to auscultation bilaterally Breasts: normal appearance, no masses or tenderness Heart: regular rate and rhythm Abdomen: soft, non-tender; no masses,  no organomegaly Extremities: extremities normal, atraumatic, no cyanosis or edema Skin: Skin color, texture, turgor normal. No rashes or lesions Lymph nodes: Cervical, supraclavicular, and axillary nodes normal. No abnormal inguinal nodes palpated Neurologic: Grossly normal   Pelvic: External genitalia:  no lesions              Urethra:  normal appearing urethra with no masses, tenderness or lesions              Bartholin's and Skene's: normal                 Vagina: normal appearing vagina with normal color and discharge, no lesions              Cervix: anteverted              Pap taken: no Bimanual Exam:  Uterus:  normal size, contour, position, consistency, mobility, non-tender              Adnexa: no mass, fullness, tenderness               Rectovaginal: Confirms               Anus:  normal sphincter tone, no lesions  A:  Well Woman with normal exam  Postmenopausal on HRT  S/P hysterosopic resection of polyps and PMB 03/11/2011  P:   Pap smear as per guidelines   Mammogram due 1/ 2015  Refill on HRT climara patch and Prometrium for 1 year.  Discussed coming off  med's and patient will consider.  counseled on potential side effects with CVA, DVT, cancer, etc. counseled on breast self exam, adequate intake of calcium and vitamin D, diet and exercise, Kegel's exercises return annually or prn  An After Visit Summary was printed and given to the patient.

## 2013-02-02 ENCOUNTER — Telehealth: Payer: Self-pay | Admitting: Nurse Practitioner

## 2013-02-02 NOTE — Telephone Encounter (Signed)
Patient was called to see if AVS form for Morgan [name] ws given to her by mistake.  We soul like to correct the error if it occurred.  would like for her to call back and inform us.

## 2013-02-04 NOTE — Progress Notes (Signed)
Encounter reviewed by Dr. Kashari Chalmers Silva.  

## 2013-04-08 ENCOUNTER — Other Ambulatory Visit: Payer: Self-pay | Admitting: Dermatology

## 2013-09-23 ENCOUNTER — Telehealth: Payer: Self-pay | Admitting: Emergency Medicine

## 2013-10-12 NOTE — Telephone Encounter (Signed)
Encounter opened erroneously.   Closed encounter.   

## 2013-10-20 ENCOUNTER — Other Ambulatory Visit: Payer: Self-pay | Admitting: Dermatology

## 2013-11-04 ENCOUNTER — Other Ambulatory Visit: Payer: Self-pay | Admitting: Dermatology

## 2013-12-07 ENCOUNTER — Telehealth: Payer: Self-pay | Admitting: Nurse Practitioner

## 2013-12-07 NOTE — Telephone Encounter (Signed)
Patient has some questions and concerns about how her insurance is filed

## 2013-12-23 ENCOUNTER — Encounter: Payer: Self-pay | Admitting: Family Medicine

## 2013-12-24 ENCOUNTER — Encounter: Payer: Self-pay | Admitting: Family Medicine

## 2013-12-29 NOTE — Telephone Encounter (Signed)
Jess can this be closed did you talk to patient.

## 2014-01-03 ENCOUNTER — Telehealth: Payer: Self-pay | Admitting: Nurse Practitioner

## 2014-01-03 NOTE — Telephone Encounter (Signed)
Spoke with patient. Patient states that she began to have some "brown spotting" on Saturday. "It is nothing heavy but it is brown and I am still spotting today. I have had two polyps removed and I have been on hormones since I was in my forties." Patient is taking prometrium 100mg  and using climara 0.05mg  patch. Denies abdominal pain. "I do not know if this is related to another polyp but I thought I might need to be seen before my annual in July." Advised patient would like her to come in to office to be seen with a doctor for evaluation. Patient agreeable. Appointment scheduled for tomorrow at 1300 with Dr.Lathrop. Patient agreeable to date and time. Patient would like to reschedule annual with Milford Cage, FNP at this time as she will be out of town. Appointment rescheduled for July 27th at 2:15pm with Milford Cage, [place]. Patient agreeable to date and time.  Patient has a history of S/P hysteroscopic resection of polyps and PMB 03/11/2011.  Routing to Dr.Lathrop as covering CC: Milford Cage, FNP  Routing to provider for final review. Patient agreeable to disposition. Will close encounter

## 2014-01-03 NOTE — Telephone Encounter (Signed)
Patient started some brown spotting on Saturday she claimed it was the beginning signs of breakthrough bleeding.

## 2014-01-04 ENCOUNTER — Encounter: Payer: Self-pay | Admitting: Gynecology

## 2014-01-04 ENCOUNTER — Ambulatory Visit (INDEPENDENT_AMBULATORY_CARE_PROVIDER_SITE_OTHER): Payer: MEDICARE | Admitting: Gynecology

## 2014-01-04 VITALS — BP 116/80 | Resp 16 | Ht 65.0 in | Wt 181.0 lb

## 2014-01-04 DIAGNOSIS — N924 Excessive bleeding in the premenopausal period: Secondary | ICD-10-CM

## 2014-01-04 MED ORDER — MISOPROSTOL 200 MCG PO TABS: ORAL_TABLET | ORAL | Status: DC

## 2014-01-04 NOTE — Progress Notes (Signed)
Subjective:     Patient ID: Morgan [name], female   DOB: 03-May-1946, 68 y.o.   MRN: [id number]  HPI Comments: Pt here reporting vaginal bleeding, dark brown spotting for 2d.  Pt had a recent D&C hysteroscopy for same issue 2012 and was noted to have 2 polyps removed.  She has spotting at that time as well. Pt has been on HRT for 24y-patch and prometrium.  No missed pills.  Pt had polyps removed before as well.   Vaginal Bleeding The patient's pertinent negatives include no pelvic pain or vaginal discharge.     Review of Systems  Genitourinary: Positive for vaginal bleeding. Negative for vaginal discharge, vaginal pain and pelvic pain.       Objective:   Physical Exam  Nursing note and vitals reviewed. Constitutional: She is oriented to person, place, and time. She appears well-developed and well-nourished.  Neurological: She is alert and oriented to person, place, and time.  Skin: Skin is warm and dry.   Pelvic: External genitalia:  no lesions              Urethra:  normal appearing urethra with no masses, tenderness or lesions              Bartholins and Skenes: normal                 Vagina: normal appearing vagina with normal color and discharge, no lesions              Cervix: os stenotic                      Bimanual Exam:  Uterus:  uterus is normal size, shape, consistency and nontender                                      Adnexa: normal adnexa in size, nontender and no masses                                         Assessment:     PMB on HRT H/o endometrial polyps     Plan:     Pt reports history of attempted biopsy in past "couldn't get through" Will pretreat with cytotec SHG-procedure outlined to pt, risks and benefits. Questions addressed

## 2014-01-04 NOTE — Patient Instructions (Signed)
Motrin 600mg  30-74m before u/s appointment Take the cytotec the morning of u/s-prescription

## 2014-01-05 ENCOUNTER — Telehealth: Payer: Self-pay | Admitting: Gynecology

## 2014-01-05 NOTE — Telephone Encounter (Signed)
Spoke with patient. Advised that per benefit quote received,she will be responsible for  $188.73 when she comes in for [facility]. Patient agreeable. Scheduled procedures. Advised patient of 72 hour cancellation policy and $973 cancellation fee. Patient agreeable. Mailed the In-Office procedure form that includes appointment date and time, patient copay, and cancellation policy.

## 2014-01-07 ENCOUNTER — Ambulatory Visit (INDEPENDENT_AMBULATORY_CARE_PROVIDER_SITE_OTHER): Payer: Medicare Other | Admitting: Family Medicine

## 2014-01-07 ENCOUNTER — Encounter: Payer: Self-pay | Admitting: Family Medicine

## 2014-01-07 VITALS — BP 120/70 | HR 52 | Temp 98.0°F | Wt 183.0 lb

## 2014-01-07 DIAGNOSIS — M5137 Other intervertebral disc degeneration, lumbosacral region: Secondary | ICD-10-CM

## 2014-01-07 DIAGNOSIS — S39012A Strain of muscle, fascia and tendon of lower back, initial encounter: Secondary | ICD-10-CM | POA: Insufficient documentation

## 2014-01-07 DIAGNOSIS — M5136 Other intervertebral disc degeneration, lumbar region: Secondary | ICD-10-CM

## 2014-01-07 DIAGNOSIS — S335XXA Sprain of ligaments of lumbar spine, initial encounter: Secondary | ICD-10-CM

## 2014-01-07 MED ORDER — DICLOFENAC SODIUM 75 MG PO TBEC: 75.0000 mg | DELAYED_RELEASE_TABLET | Freq: Two times a day (BID) | ORAL | Status: DC

## 2014-01-07 NOTE — Progress Notes (Signed)
Pre visit review using our clinic review tool, if applicable. No additional management support is needed unless otherwise documented below in the visit note. 

## 2014-01-07 NOTE — Patient Instructions (Signed)
Stop aleve. Start diclofenac twice a day.  Heat on low back .  Can continue muscle relaxant as needed.  Start gentle stretches.  Call if not improving in next 2 weeks, call sooner if severe pain.

## 2014-01-07 NOTE — Assessment & Plan Note (Signed)
Treat with NSAIDs, heat, stretches and muscle relaxant prn. Follow up if not improving in 2 weeks.

## 2014-01-07 NOTE — Progress Notes (Signed)
   Subjective:    Patient ID: Morgan [name], female    DOB: [birth year], 68 y.o.   MRN: [id number]  Back Pain Pertinent negatives include no abdominal pain, chest pain, dysuria or fever.    68 year old female  Pt of Dr. Marliss Coots presents for  low back pain. She has a history of DDD lumbar and cervical spine as well as spinal stenosis.   Today she reports new onset in last 4 days in right low back. No radiation of pain to leg or buttock. She has never had pain in right low back. No new injury or fall.  She has been using aleve 1 every 12 hours. She has had 50% improvement in pain but not enough.  She has started husbands muscle relaxant: methocarbamol. Med is helping her sleep at night.   She is supposed to go to trip this weekend.  No numbness and weakness in legs. No fever, no incontinence.     Review of Systems  Constitutional: Negative for fever and fatigue.  HENT: Negative for ear pain.   Eyes: Negative for pain.  Respiratory: Negative for chest tightness and shortness of breath.   Cardiovascular: Negative for chest pain, palpitations and leg swelling.  Gastrointestinal: Negative for abdominal pain.  Genitourinary: Negative for dysuria.  Musculoskeletal: Positive for back pain.       Objective:   Physical Exam  Constitutional: Vital signs are normal. She appears well-developed and well-nourished. She is cooperative.  Non-toxic appearance. She does not appear ill. No distress.  HENT:  Mouth/Throat: Mucous membranes are normal.  Eyes: Lids are normal. Lids are everted and swept, no foreign bodies found.  Neck: Trachea normal and normal range of motion. Neck supple. Carotid bruit is not present. No mass and no thyromegaly present.  Cardiovascular: Normal rate, regular rhythm, S1 normal, S2 normal, normal heart sounds, intact distal pulses and normal pulses.  Exam reveals no gallop and no friction rub.   No murmur heard. Pulmonary/Chest: Effort normal and breath sounds  normal. Not tachypneic. No respiratory distress. She has no decreased breath sounds. She has no wheezes. She has no rhonchi. She has no rales.  Abdominal: Soft. Normal appearance and bowel sounds are normal. There is no tenderness.  Musculoskeletal:       Thoracic back: Normal. She exhibits normal range of motion, no tenderness and no bony tenderness.       Lumbar back: She exhibits tenderness. She exhibits normal range of motion, no bony tenderness and no swelling.       Back:  Neg SLR B, neg faber's  Neurological: She is alert.  Skin: Skin is warm, dry and intact. No rash noted.  Psychiatric: Her speech is normal and behavior is normal. Judgment and thought content normal. Her mood appears not anxious. Cognition and memory are normal. She does not exhibit a depressed mood.          Assessment & Plan:

## 2014-01-18 ENCOUNTER — Ambulatory Visit (INDEPENDENT_AMBULATORY_CARE_PROVIDER_SITE_OTHER): Payer: MEDICARE

## 2014-01-18 ENCOUNTER — Encounter: Payer: Self-pay | Admitting: Gynecology

## 2014-01-18 ENCOUNTER — Ambulatory Visit (INDEPENDENT_AMBULATORY_CARE_PROVIDER_SITE_OTHER): Payer: MEDICARE | Admitting: Gynecology

## 2014-01-18 ENCOUNTER — Other Ambulatory Visit: Payer: Self-pay | Admitting: Gynecology

## 2014-01-18 ENCOUNTER — Ambulatory Visit: Payer: Medicare Other | Admitting: Family Medicine

## 2014-01-18 VITALS — BP 112/68 | Resp 14 | Ht 65.0 in | Wt 182.0 lb

## 2014-01-18 DIAGNOSIS — N924 Excessive bleeding in the premenopausal period: Secondary | ICD-10-CM

## 2014-01-18 NOTE — Progress Notes (Signed)
       Pt here for PMB on HRT without any missed doses.  Pt has been on HRT for 24y.  Pt has a history of endometrial polyps in past. U/s today shows uterus 9.1x5.8x4.1cm, EMS 4.2, atrophic ovaries. SHG performed after consent; Speculum placed, cervix cleansed with betadine, insemination catheter placed with ease, walls distended, no defects noted. Because of new onset of bleeding and need to prime cervix with cytotec, recommend EMB as well. Risks and beneftis reviewed and accepted. Speculum replaced, cervix recleansed, pipelle advanced to 8cm, 3 passes for adequate tissue obtained Tolerated well  Tissue to path Will contact with results

## 2014-01-20 ENCOUNTER — Observation Stay (HOSPITAL_COMMUNITY): Payer: Medicare Other

## 2014-01-20 ENCOUNTER — Emergency Department (HOSPITAL_COMMUNITY): Payer: Medicare Other

## 2014-01-20 ENCOUNTER — Inpatient Hospital Stay (HOSPITAL_COMMUNITY)
Admission: EM | Admit: 2014-01-20 | Discharge: 2014-01-24 | DRG: 390 | Disposition: A | Discharge status: Home / Self Care | Payer: Medicare Other | Attending: Surgery | Admitting: Surgery

## 2014-01-20 ENCOUNTER — Telehealth: Payer: Self-pay | Admitting: Gynecology

## 2014-01-20 ENCOUNTER — Encounter (HOSPITAL_COMMUNITY): Payer: Self-pay | Admitting: Emergency Medicine

## 2014-01-20 DIAGNOSIS — K565 Intestinal adhesions [bands], unspecified as to partial versus complete obstruction: Secondary | ICD-10-CM

## 2014-01-20 DIAGNOSIS — E042 Nontoxic multinodular goiter: Secondary | ICD-10-CM | POA: Diagnosis present

## 2014-01-20 DIAGNOSIS — Z9089 Acquired absence of other organs: Secondary | ICD-10-CM

## 2014-01-20 DIAGNOSIS — Z8249 Family history of ischemic heart disease and other diseases of the circulatory system: Secondary | ICD-10-CM

## 2014-01-20 DIAGNOSIS — Z6830 Body mass index (BMI) 30.0-30.9, adult: Secondary | ICD-10-CM

## 2014-01-20 DIAGNOSIS — Z833 Family history of diabetes mellitus: Secondary | ICD-10-CM

## 2014-01-20 DIAGNOSIS — Z8619 Personal history of other infectious and parasitic diseases: Secondary | ICD-10-CM | POA: Diagnosis present

## 2014-01-20 DIAGNOSIS — Z8719 Personal history of other diseases of the digestive system: Secondary | ICD-10-CM | POA: Diagnosis present

## 2014-01-20 DIAGNOSIS — M359 Systemic involvement of connective tissue, unspecified: Secondary | ICD-10-CM | POA: Diagnosis present

## 2014-01-20 DIAGNOSIS — Z87442 Personal history of urinary calculi: Secondary | ICD-10-CM

## 2014-01-20 DIAGNOSIS — E669 Obesity, unspecified: Secondary | ICD-10-CM | POA: Diagnosis present

## 2014-01-20 DIAGNOSIS — M479 Spondylosis, unspecified: Secondary | ICD-10-CM | POA: Diagnosis present

## 2014-01-20 DIAGNOSIS — M4800 Spinal stenosis, site unspecified: Secondary | ICD-10-CM | POA: Diagnosis present

## 2014-01-20 DIAGNOSIS — M159 Polyosteoarthritis, unspecified: Secondary | ICD-10-CM | POA: Diagnosis present

## 2014-01-20 DIAGNOSIS — Z87891 Personal history of nicotine dependence: Secondary | ICD-10-CM

## 2014-01-20 DIAGNOSIS — K219 Gastro-esophageal reflux disease without esophagitis: Secondary | ICD-10-CM | POA: Diagnosis present

## 2014-01-20 DIAGNOSIS — K589 Irritable bowel syndrome without diarrhea: Secondary | ICD-10-CM | POA: Diagnosis present

## 2014-01-20 HISTORY — DX: Nontoxic multinodular goiter: E04.2

## 2014-01-20 LAB — URINALYSIS, ROUTINE W REFLEX MICROSCOPIC
Bilirubin Urine: NEGATIVE
Glucose, UA: NEGATIVE mg/dL
HGB URINE DIPSTICK: NEGATIVE
Ketones, ur: NEGATIVE mg/dL
LEUKOCYTES UA: NEGATIVE
Nitrite: NEGATIVE
PH: 5.5 (ref 5.0–8.0)
PROTEIN: NEGATIVE mg/dL
Specific Gravity, Urine: 1.027 (ref 1.005–1.030)
Urobilinogen, UA: 0.2 mg/dL (ref 0.0–1.0)

## 2014-01-20 LAB — CBC WITH DIFFERENTIAL/PLATELET
BASOS ABS: 0 10*3/uL (ref 0.0–0.1)
Basophils Relative: 0 % (ref 0–1)
Eosinophils Absolute: 0 10*3/uL (ref 0.0–0.7)
Eosinophils Relative: 0 % (ref 0–5)
HCT: 39.4 % (ref 36.0–46.0)
Hemoglobin: 13.2 g/dL (ref 12.0–15.0)
LYMPHS ABS: 1.3 10*3/uL (ref 0.7–4.0)
LYMPHS PCT: 11 % — AB (ref 12–46)
MCH: 30.7 pg (ref 26.0–34.0)
MCHC: 33.5 g/dL (ref 30.0–36.0)
MCV: 91.6 fL (ref 78.0–100.0)
Monocytes Absolute: 0.6 10*3/uL (ref 0.1–1.0)
Monocytes Relative: 5 % (ref 3–12)
NEUTROS ABS: 9.5 10*3/uL — AB (ref 1.7–7.7)
NEUTROS PCT: 83 % — AB (ref 43–77)
PLATELETS: 206 10*3/uL (ref 150–400)
RBC: 4.3 MIL/uL (ref 3.87–5.11)
RDW: 12.7 % (ref 11.5–15.5)
WBC: 11.4 10*3/uL — AB (ref 4.0–10.5)

## 2014-01-20 LAB — COMPREHENSIVE METABOLIC PANEL
ALK PHOS: 76 U/L (ref 39–117)
ALT: 18 U/L (ref 0–35)
AST: 23 U/L (ref 0–37)
Albumin: 3.8 g/dL (ref 3.5–5.2)
Anion gap: 11 (ref 5–15)
BUN: 19 mg/dL (ref 6–23)
CALCIUM: 8.9 mg/dL (ref 8.4–10.5)
CO2: 27 meq/L (ref 19–32)
Chloride: 106 mEq/L (ref 96–112)
Creatinine, Ser: 0.73 mg/dL (ref 0.50–1.10)
GFR, EST NON AFRICAN AMERICAN: 86 mL/min — AB (ref >90)
GLUCOSE: 169 mg/dL — AB (ref 70–99)
POTASSIUM: 4.6 meq/L (ref 3.7–5.3)
Sodium: 144 mEq/L (ref 137–147)
Total Bilirubin: 0.4 mg/dL (ref 0.3–1.2)
Total Protein: 6.3 g/dL (ref 6.0–8.3)

## 2014-01-20 LAB — LIPASE, BLOOD: Lipase: 17 U/L (ref 11–59)

## 2014-01-20 MED ORDER — MORPHINE SULFATE 2 MG/ML IJ SOLN: 1.0000 mg | INTRAMUSCULAR | Status: DC | PRN

## 2014-01-20 MED ORDER — ONDANSETRON HCL 4 MG/2ML IJ SOLN
4.0000 mg | Freq: Four times a day (QID) | INTRAMUSCULAR | Status: DC | PRN
  Administered 2014-01-20 – 2014-01-21 (×2): 4 mg via INTRAVENOUS
  Filled 2014-01-20 (×2): qty 2

## 2014-01-20 MED ORDER — MORPHINE SULFATE 4 MG/ML IJ SOLN
4.0000 mg | Freq: Once | INTRAMUSCULAR | Status: AC
  Administered 2014-01-20: 4 mg via INTRAVENOUS
  Filled 2014-01-20: qty 1

## 2014-01-20 MED ORDER — IOHEXOL 300 MG/ML  SOLN
50.0000 mL | Freq: Once | INTRAMUSCULAR | Status: AC | PRN
  Administered 2014-01-20: 50 mL via ORAL

## 2014-01-20 MED ORDER — IOHEXOL 300 MG/ML  SOLN
100.0000 mL | Freq: Once | INTRAMUSCULAR | Status: AC | PRN
  Administered 2014-01-20: 100 mL via INTRAVENOUS

## 2014-01-20 MED ORDER — HYDROMORPHONE HCL PF 1 MG/ML IJ SOLN
0.5000 mg | INTRAMUSCULAR | Status: DC | PRN
  Administered 2014-01-20: 0.5 mg via INTRAVENOUS
  Administered 2014-01-21: 1 mg via INTRAVENOUS
  Filled 2014-01-20 (×2): qty 1

## 2014-01-20 MED ORDER — POTASSIUM CHLORIDE IN NACL 20-0.9 MEQ/L-% IV SOLN
INTRAVENOUS | Status: DC
  Administered 2014-01-20 (×2): via INTRAVENOUS
  Filled 2014-01-20 (×5): qty 1000

## 2014-01-20 MED ORDER — CHLORHEXIDINE GLUCONATE 0.12 % MT SOLN
15.0000 mL | Freq: Two times a day (BID) | OROMUCOSAL | Status: DC
  Administered 2014-01-22: 15 mL via OROMUCOSAL
  Filled 2014-01-20 (×8): qty 15

## 2014-01-20 MED ORDER — DIPHENHYDRAMINE HCL 12.5 MG/5ML PO ELIX: 12.5000 mg | ORAL_SOLUTION | Freq: Four times a day (QID) | ORAL | Status: DC | PRN

## 2014-01-20 MED ORDER — DIPHENHYDRAMINE HCL 50 MG/ML IJ SOLN
12.5000 mg | Freq: Four times a day (QID) | INTRAMUSCULAR | Status: DC | PRN
  Administered 2014-01-21 – 2014-01-22 (×2): 12.5 mg via INTRAVENOUS
  Filled 2014-01-20 (×2): qty 1

## 2014-01-20 MED ORDER — MENTHOL 3 MG MT LOZG
1.0000 | LOZENGE | OROMUCOSAL | Status: DC | PRN
  Filled 2014-01-20 (×2): qty 9

## 2014-01-20 MED ORDER — BIOTENE DRY MOUTH MT LIQD
15.0000 mL | Freq: Two times a day (BID) | OROMUCOSAL | Status: DC
  Administered 2014-01-20 – 2014-01-21 (×3): 15 mL via OROMUCOSAL

## 2014-01-20 MED ORDER — HEPARIN SODIUM (PORCINE) 5000 UNIT/ML IJ SOLN
5000.0000 [IU] | Freq: Three times a day (TID) | INTRAMUSCULAR | Status: DC
  Administered 2014-01-20 – 2014-01-22 (×5): 5000 [IU] via SUBCUTANEOUS
  Filled 2014-01-20 (×10): qty 1

## 2014-01-20 MED ORDER — IBUPROFEN 800 MG PO TABS: 400.0000 mg | ORAL_TABLET | Freq: Four times a day (QID) | ORAL | Status: DC | PRN

## 2014-01-20 NOTE — Telephone Encounter (Signed)
On Call Note:  3 AM   Had sonohystogram with biopsy 01/18/2014.  Since then has had abdominal bloating with discomfort which seems to be worsening.  Now with nausea and cold sweats.  Eating, drinking, voiding, having BMs.  No fevers, scant bleeding.  Differential discussed to include perforation with/without bowel injury, infection, non gyn.  Recommended ER evaluation due to possibilities.

## 2014-01-20 NOTE — ED Provider Notes (Signed)
6:41 AM Patient signed out to me by Alecia Lemming, PA-C, at change of shift.  Patient with abdominal pain that began yesterday.  CT pending.  Pt also with recent 01/18/14 endometrial biopsy. Plan is for pain control, dispo pending clinical improvement and results.   7:12 AM Discussed CT results and plan with patient.  Plan is for NG tube, admission.  Pt verbalizes understanding and agrees with plan. Pt declines further pain medication at this time.  Abd: NABS, soft, TTP diffusely worse in lower abdomen, no guarding, no rebound.   7:27 AM Discussed pt with Dr Harlow Asa who will see the patient.   Results for orders placed during the hospital encounter of 01/20/14  CBC WITH DIFFERENTIAL      Result Value Ref Range   WBC 11.4 (*) 4.0 - 10.5 K/uL   RBC 4.30  3.87 - 5.11 MIL/uL   Hemoglobin 13.2  12.0 - 15.0 g/dL   HCT 39.4  36.0 - 46.0 %   MCV 91.6  78.0 - 100.0 fL   MCH 30.7  26.0 - 34.0 pg   MCHC 33.5  30.0 - 36.0 g/dL   RDW 12.7  11.5 - 15.5 %   Platelets 206  150 - 400 K/uL   Neutrophils Relative % 83 (*) 43 - 77 %   Neutro Abs 9.5 (*) 1.7 - 7.7 K/uL   Lymphocytes Relative 11 (*) 12 - 46 %   Lymphs Abs 1.3  0.7 - 4.0 K/uL   Monocytes Relative 5  3 - 12 %   Monocytes Absolute 0.6  0.1 - 1.0 K/uL   Eosinophils Relative 0  0 - 5 %   Eosinophils Absolute 0.0  0.0 - 0.7 K/uL   Basophils Relative 0  0 - 1 %   Basophils Absolute 0.0  0.0 - 0.1 K/uL  COMPREHENSIVE METABOLIC PANEL      Result Value Ref Range   Sodium 144  137 - 147 mEq/L   Potassium 4.6  3.7 - 5.3 mEq/L   Chloride 106  96 - 112 mEq/L   CO2 27  19 - 32 mEq/L   Glucose, Bld 169 (*) 70 - 99 mg/dL   BUN 19  6 - 23 mg/dL   Creatinine, Ser 0.73  0.50 - 1.10 mg/dL   Calcium 8.9  8.4 - 10.5 mg/dL   Total Protein 6.3  6.0 - 8.3 g/dL   Albumin 3.8  3.5 - 5.2 g/dL   AST 23  0 - 37 U/L   ALT 18  0 - 35 U/L   Alkaline Phosphatase 76  39 - 117 U/L   Total Bilirubin 0.4  0.3 - 1.2 mg/dL   GFR calc non Af Amer 86 (*) >90 mL/min   GFR  calc Af Amer >90  >90 mL/min   Anion gap 11  5 - 15  LIPASE, BLOOD      Result Value Ref Range   Lipase 17  11 - 59 U/L  URINALYSIS, ROUTINE W REFLEX MICROSCOPIC      Result Value Ref Range   Color, Urine YELLOW  YELLOW   APPearance CLOUDY (*) CLEAR   Specific Gravity, Urine 1.027  1.005 - 1.030   pH 5.5  5.0 - 8.0   Glucose, UA NEGATIVE  NEGATIVE mg/dL   Hgb urine dipstick NEGATIVE  NEGATIVE   Bilirubin Urine NEGATIVE  NEGATIVE   Ketones, ur NEGATIVE  NEGATIVE mg/dL   Protein, ur NEGATIVE  NEGATIVE mg/dL   Urobilinogen, UA 0.2  0.0 - 1.0 mg/dL   Nitrite NEGATIVE  NEGATIVE   Leukocytes, UA NEGATIVE  NEGATIVE   US Pelvis Complete  01/18/2014   SEE PROGRESS NOTES FOR RESULTS   Ct Abdomen Pelvis W Contrast  01/20/2014   CLINICAL DATA:  Lower abdominal pain. Remote appendectomy. History of nephrolithiasis  EXAM: CT ABDOMEN AND PELVIS WITH CONTRAST  TECHNIQUE: Multidetector CT imaging of the abdomen and pelvis was performed using the standard protocol following bolus administration of intravenous contrast.  CONTRAST:  18mL OMNIPAQUE IOHEXOL 300 MG/ML  SOLN  COMPARISON:  02/22/2009  FINDINGS: BODY WALL: Unremarkable.  LOWER CHEST: Unremarkable.  ABDOMEN/PELVIS:  Liver: No focal abnormality.  Biliary: Chronic cystic like thickening of the gallbladder fundus, favor adenomyomatosis versus prominent gallbladder wall folding. No calcified gallstone.  Pancreas: Unremarkable.  Spleen: Unremarkable.  Adrenals: Unremarkable.  Kidneys and ureters: No hydronephrosis or stone.  Bladder: Unremarkable.  Reproductive: Unremarkable.  Bowel: There is dilated small bowel with fluid levels and fecalized contents proximal to a segment of bowel wall thickening by submucosal edema. Distal to this thickened segment, there is abrupt angulation of the bowel consistent with transition point. This is in the low central abdomen. There is no evidence of mass or inflammation at this level. The major vessels are patent. There is  reactive mesenteric edema; no pneumatosis or pneumoperitoneum to suggest bowel perforation or devascularization. All the segments of bowel not filled with contrast show wall enhancement (enhancement elsewhere is obscured). Distal colonic diverticulosis which is relatively mild. There is no visible appendix, likely surgically absent.  Retroperitoneum: No mass or adenopathy.  Peritoneum: There is small volume perihepatic and interloop ascites which is likely reactive to the above.  Vascular: No acute abnormality.  OSSEOUS: Advanced lower lumbar facet osteoarthritis, resulting in grade 1 L5-S1 anterolisthesis. Focally advanced L2-3 degenerative disc disease with narrowing and sclerosis.  IMPRESSION: High-grade distal small bowel obstruction which is likely from adhesions, less likely from focal enteritis. There is mesenteric and bowel congestion but no evidence of bowel necrosis.   Electronically Signed   By: Jorje Guild M.D.   On: 01/20/2014 06:45   [place] Sonohysterogram  01/18/2014   SEE PROGRESS NOTES FOR RESULTS      Clayton Bibles, PA-C 01/20/14 [phone]

## 2014-01-20 NOTE — ED Notes (Signed)
Patient transported to CT 

## 2014-01-20 NOTE — ED Provider Notes (Signed)
Medical screening examination/treatment/procedure(s) were conducted as a shared visit with non-physician practitioner(s) and myself.  I personally evaluated the patient during the encounter.   EKG Interpretation None     Pt with abd pain. + diffuse tenderness, with guarding. CT shows SBO. PA-C to call surgery. NPO pain control for now.  Varney Biles, MD 01/20/14 [phone]

## 2014-01-20 NOTE — H&P (Signed)
General Surgery [facility]  Patient seen and examined.  Family at bedside.  Patient improved since ER with NG tube in place.  Denies abdominal pain.  Abdomen soft without distension.  No tenderness.  Minimal from NG tube.  Will follow.  AXR in AM.  [name], MD, [facility] Office: [phone]

## 2014-01-20 NOTE — ED Notes (Signed)
Bed: [id number] Expected date:  Expected time:  Means of arrival:  Comments: ems

## 2014-01-20 NOTE — ED Notes (Signed)
Per EMS report: pt reports lower abd pain that began at 15:00 on 01/19/14.  Pt does not have an appendix but had uterine biopsy this past Tuesday at 15:00.  Pt also hx of kidney stones but states this pain is not similar to that.  Pt denies diarrhea but has vomiting x 1.  Nothing makes the pain better or worse.  Pt has not noted any spotting or bleeding. Pt a/o x 4.  EMS gave pt 4mg  zofran and 113mcg of fentanyl.  Pt reports pain medication did help bring pain from a 10/10 to a 7/10

## 2014-01-20 NOTE — ED Provider Notes (Signed)
I agree with the assessment by the PA-C and was available for immediate supervision if needed. Pt was seen by me, and has a SBO - awaiting admission.  Varney Biles, MD 01/20/14 2303

## 2014-01-20 NOTE — ED Provider Notes (Signed)
CSN: [id number]     Arrival date & time 01/20/14  0421 History   First MD Initiated Contact with Patient 01/20/14 [phone]     Chief Complaint  Patient presents with  . Abdominal Pain     (Consider location/radiation/quality/duration/timing/severity/associated sxs/prior Treatment) HPI Comments: Patient with history of appendectomy, recent endometrial biopsy -- presents with complaint of gradual onset periumbilical pain that became severe at approximately 8 to 10 PM. Patient states that the pain was so bad that she vomited. She has not had similar pain in the past. She's had kidney stones, but this pain is worse, feels different. No fever. Patient called EMS and was given Zofran and fentanyl which improved pain. No current vaginal symptoms. No back pain. Onset of symptoms acute. Nothing makes symptoms worse. Food makes symptoms worse.  Patient is a 68 y.o. female presenting with abdominal pain. The history is provided by the patient.  Abdominal Pain Associated symptoms: nausea and vomiting   Associated symptoms: no chest pain, no cough, no diarrhea, no dysuria, no fever, no sore throat and no vaginal bleeding (previously)     Past Medical History  Diagnosis Date  . HLD (hyperlipidemia)   . Osteoarthritis of cervical spine   . Connective tissue disease   . DDD (degenerative disc disease), cervical   . DDD (degenerative disc disease), lumbar   . Spinal stenosis   . Obesity   . Endometrial polyp     not resolved with D&C (following)  . Bradycardia   . PONV (postoperative nausea and vomiting)   . GERD (gastroesophageal reflux disease)     gas/belching issues-was on protonix for short time-not since 6 months  . Mixed connective tissue disease diagnosed 5 years ago    no problems since diagnosis  . Allergy   . Renal calculi    Past Surgical History  Procedure Laterality Date  . Hand surgery      carpal tunnel release bilateral Dr. Daylene Katayama  . Tonsillectomy    . Appendectomy    .  Dilation and curettage of uterus      fibroids  . Dilation and curettage of uterus  01/2005    endometrial polyp  . Endometrial biopsy  2008    fibroid  . Tubal ligation      1973  . Colonoscopy    . Upper gastrointestinal endoscopy    . Hysteroscopic resection       polyps of PMB   Family History  Problem Relation Age of Onset  . Coronary artery disease Father   . Heart attack Father     deceased at 11  . Diabetes Maternal Grandmother   . Goiter Maternal Grandmother   . Diabetes Cousin   . Cancer Grandchild     rare type (dying at age 70)  . Colon cancer Neg Hx   . Esophageal cancer Neg Hx   . Stomach cancer Neg Hx   . Rectal cancer Neg Hx    History  Substance Use Topics  . Smoking status: Former Smoker    Types: Cigarettes    Quit date: 07/22/1965  . Smokeless tobacco: Never Used  . Alcohol Use: No   OB History   Grav Para Term Preterm Abortions TAB SAB Ect Mult Living   3 3       1 4      Review of Systems  Constitutional: Negative for fever.  HENT: Negative for rhinorrhea and sore throat.   Eyes: Negative for redness.  Respiratory: Negative for cough.  Cardiovascular: Negative for chest pain.  Gastrointestinal: Positive for nausea, vomiting and abdominal pain. Negative for diarrhea.  Genitourinary: Negative for dysuria and vaginal bleeding (previously).  Musculoskeletal: Negative for myalgias.  Skin: Negative for rash.  Neurological: Negative for headaches.      Allergies  Codeine; Hydrocodone-acetaminophen; and Penicillins  Home Medications   Prior to Admission medications   Medication Sig Start Date End Date Taking? Authorizing Provider  Ascorbic Acid (VITAMIN C) 1000 MG tablet Take 1,000 mg by mouth daily.    Historical Provider, MD  aspirin 81 MG tablet Take 81 mg by mouth daily. 3 times a week    Historical Provider, MD  Cholecalciferol (VITAMIN D-3 PO) Take by mouth.    Historical Provider, MD  Clobetasol Propionate Emulsion 0.05 % topical  foam  12/31/12   Historical Provider, MD  diclofenac (VOLTAREN) 75 MG EC tablet Take 1 tablet (75 mg total) by mouth 2 (two) times daily. 01/07/14   Amy Cletis Athens, MD  estradiol (CLIMARA - DOSED IN MG/24 HR) 0.05 mg/24hr Place 1 patch (0.05 mg total) onto the skin once a week. 02/01/13   Mardene Celeste Rolen-Grubb, FNP  Lactobacillus Rhamnosus, GG, (CULTURELLE PO) Take by mouth.    Historical Provider, MD  levothyroxine (SYNTHROID, LEVOTHROID) 50 MCG tablet Take 50 mcg 3 times a week,25 mcg 4 times a week    Historical Provider, MD  misoprostol (CYTOTEC) 200 MCG tablet 1 po morning of 01/04/14   Azalia Bilis, MD  Multiple Vitamins-Minerals (HAIR/SKIN/NAILS PO) Take by mouth.    Historical Provider, MD  progesterone (PROMETRIUM) 100 MG capsule Take 1 capsule (100 mg total) by mouth daily. 02/01/13   Milford Cage, FNP  valACYclovir (VALTREX) 1000 MG tablet Take 1,000 mg by mouth as needed.    Historical Provider, MD   BP 100/48  Pulse 50  Temp(Src) 98.4 F (36.9 C) (Oral)  Resp 18  SpO2 98%  LMP 07/22/1989  Physical Exam  Nursing note and vitals reviewed. Constitutional: She appears well-developed and well-nourished.  HENT:  Head: Normocephalic and atraumatic.  Eyes: Conjunctivae are normal. Right eye exhibits no discharge. Left eye exhibits no discharge.  Neck: Normal range of motion. Neck supple.  Cardiovascular: Normal rate and regular rhythm.   No murmur heard. Pulmonary/Chest: Effort normal and breath sounds normal. No respiratory distress. She has no wheezes. She has no rales.  Abdominal: Soft. Bowel sounds are normal. She exhibits no distension. There is tenderness (worse left middle and left lower). There is no rebound and no guarding.  Neurological: She is alert.  Skin: Skin is warm and dry.  Psychiatric: She has a normal mood and affect.    ED Course  Procedures (including critical care time) Labs Review Labs Reviewed  CBC WITH DIFFERENTIAL - Abnormal; Notable for the  following:    WBC 11.4 (*)    Neutrophils Relative % 83 (*)    Neutro Abs 9.5 (*)    Lymphocytes Relative 11 (*)    All other components within normal limits  COMPREHENSIVE METABOLIC PANEL - Abnormal; Notable for the following:    Glucose, Bld 169 (*)    GFR calc non Af Amer 86 (*)    All other components within normal limits  LIPASE, BLOOD  URINALYSIS, ROUTINE W REFLEX MICROSCOPIC    Imaging Review US Pelvis Complete  01/18/2014   SEE PROGRESS NOTES FOR RESULTS   Ct Abdomen Pelvis W Contrast  01/20/2014   CLINICAL DATA:  Lower abdominal pain. Remote appendectomy. History of  nephrolithiasis  EXAM: CT ABDOMEN AND PELVIS WITH CONTRAST  TECHNIQUE: Multidetector CT imaging of the abdomen and pelvis was performed using the standard protocol following bolus administration of intravenous contrast.  CONTRAST:  172mL OMNIPAQUE IOHEXOL 300 MG/ML  SOLN  COMPARISON:  02/22/2009  FINDINGS: BODY WALL: Unremarkable.  LOWER CHEST: Unremarkable.  ABDOMEN/PELVIS:  Liver: No focal abnormality.  Biliary: Chronic cystic like thickening of the gallbladder fundus, favor adenomyomatosis versus prominent gallbladder wall folding. No calcified gallstone.  Pancreas: Unremarkable.  Spleen: Unremarkable.  Adrenals: Unremarkable.  Kidneys and ureters: No hydronephrosis or stone.  Bladder: Unremarkable.  Reproductive: Unremarkable.  Bowel: There is dilated small bowel with fluid levels and fecalized contents proximal to a segment of bowel wall thickening by submucosal edema. Distal to this thickened segment, there is abrupt angulation of the bowel consistent with transition point. This is in the low central abdomen. There is no evidence of mass or inflammation at this level. The major vessels are patent. There is reactive mesenteric edema; no pneumatosis or pneumoperitoneum to suggest bowel perforation or devascularization. All the segments of bowel not filled with contrast show wall enhancement (enhancement elsewhere is  obscured). Distal colonic diverticulosis which is relatively mild. There is no visible appendix, likely surgically absent.  Retroperitoneum: No mass or adenopathy.  Peritoneum: There is small volume perihepatic and interloop ascites which is likely reactive to the above.  Vascular: No acute abnormality.  OSSEOUS: Advanced lower lumbar facet osteoarthritis, resulting in grade 1 L5-S1 anterolisthesis. Focally advanced L2-3 degenerative disc disease with narrowing and sclerosis.  IMPRESSION: High-grade distal small bowel obstruction which is likely from adhesions, less likely from focal enteritis. There is mesenteric and bowel congestion but no evidence of bowel necrosis.   Electronically Signed   By: Jorje Guild M.D.   On: 01/20/2014 06:45   [place] Sonohysterogram  01/18/2014   SEE PROGRESS NOTES FOR RESULTS     EKG Interpretation None      4:43 AM Patient seen and examined. Work-up initiated. Medications ordered.   Vital signs reviewed and are as follows: Filed Vitals:   01/20/14 0424  BP: 100/48  Pulse: 50  Temp: 98.4 F (36.9 C)  Resp: 18   7:06 AM Handoff to [place] PA-C at shift change. Will need surgical consult and admission.   MDM   Final diagnoses:  Small bowel obstruction due to adhesions   Admit for above.     Carlisle Cater, PA-C 01/20/14 [phone]

## 2014-01-20 NOTE — H&P (Signed)
Morgan [name] is an 68 y.o. female.   Chief Complaint:  Abdominal pain, abdominal swelling, nausea and vomiting. HPI: Pt came home from the beach and thought she ate some bad ham after returning.  She started having abdominal pain around 3 PM yesterday.   No nausea or vomiting at this point. She had a Uterine bx on 01/18/14 and was concerned this pain was from that also.  She said symptoms got worse but she went to bed at 9 PM.  She slept till about 2AM. She had tried some Miralax before bed, along with an Aleve.  When she woke up pain was worse, distension was worse.  She had a BM, but then developed severe nausea and vomiting.  She said she only vomited x 1, but a very large quantity.  She was transported to the ED, labs are OK; WBC is up to 11.4 glucose is up to 169, otherwise normal.  CT scan shows high grade distal SBOwith mesenteric bowel congestion..  She has had an NG placed and has over 1 liter from this.  Her symptoms are currently much improved.  We are ask to see.  Past Medical History  Diagnosis Date  Mixed Connective tissue disease Pt says this is in remission  Osteoarthritis of cervical spine   DDD (degenerative disc disease), cervical  DDD (degenerative disc disease), lumbar  Spinal stenosis     Not currently much of an issue  GERD/Gas belching,    Thyroid nodules; currently on synthroid   Renal calculi   Obesity   Endometrial polyp        Past Surgical History  Procedure Laterality Date  . Hand surgery      carpal tunnel release bilateral Dr. Daylene Katayama  . Tonsillectomy    . Appendectomy    . Dilation and curettage of uterus      fibroids  . Dilation and curettage of uterus  01/2005    endometrial polyp  . Endometrial biopsy  2008    fibroid  . Tubal ligation      1973  . Colonoscopy    . Upper gastrointestinal endoscopy    . Hysteroscopic resection       polyps of PMB    Family History  Problem Relation Age of Onset  . Coronary artery disease Father   .  Heart attack Father     deceased at 73  . Diabetes Maternal Grandmother   . Goiter Maternal Grandmother   . Diabetes Cousin   . Cancer Grandchild     rare type (dying at age 65)  . Colon cancer Neg Hx   . Esophageal cancer Neg Hx   . Stomach cancer Neg Hx   . Rectal cancer Neg Hx    Social History:  reports that she quit smoking about 48 years ago. Her smoking use included Cigarettes. She smoked 0.00 packs per day. She has never used smokeless tobacco. She reports that she does not drink alcohol or use illicit drugs.  Allergies:  Allergies  Allergen Reactions  . Codeine Nausea And Vomiting  . Hydrocodone-Acetaminophen     REACTION: reaction not known  . Penicillins Hives     (Not in a hospital admission)  Results for orders placed during the hospital encounter of 01/20/14 (from the past 48 hour(s))  CBC WITH DIFFERENTIAL     Status: Abnormal   Collection Time    01/20/14  5:01 AM      Result Value Ref Range   WBC 11.4 (*)  4.0 - 10.5 K/uL   RBC 4.30  3.87 - 5.11 MIL/uL   Hemoglobin 13.2  12.0 - 15.0 g/dL   HCT 39.4  36.0 - 46.0 %   MCV 91.6  78.0 - 100.0 fL   MCH 30.7  26.0 - 34.0 pg   MCHC 33.5  30.0 - 36.0 g/dL   RDW 12.7  11.5 - 15.5 %   Platelets 206  150 - 400 K/uL   Neutrophils Relative % 83 (*) 43 - 77 %   Neutro Abs 9.5 (*) 1.7 - 7.7 K/uL   Lymphocytes Relative 11 (*) 12 - 46 %   Lymphs Abs 1.3  0.7 - 4.0 K/uL   Monocytes Relative 5  3 - 12 %   Monocytes Absolute 0.6  0.1 - 1.0 K/uL   Eosinophils Relative 0  0 - 5 %   Eosinophils Absolute 0.0  0.0 - 0.7 K/uL   Basophils Relative 0  0 - 1 %   Basophils Absolute 0.0  0.0 - 0.1 K/uL  COMPREHENSIVE METABOLIC PANEL     Status: Abnormal   Collection Time    01/20/14  5:01 AM      Result Value Ref Range   Sodium 144  137 - 147 mEq/L   Potassium 4.6  3.7 - 5.3 mEq/L   Chloride 106  96 - 112 mEq/L   CO2 27  19 - 32 mEq/L   Glucose, Bld 169 (*) 70 - 99 mg/dL   BUN 19  6 - 23 mg/dL   Creatinine, Ser 0.73  0.50  - 1.10 mg/dL   Calcium 8.9  8.4 - 10.5 mg/dL   Total Protein 6.3  6.0 - 8.3 g/dL   Albumin 3.8  3.5 - 5.2 g/dL   AST 23  0 - 37 U/L   ALT 18  0 - 35 U/L   Alkaline Phosphatase 76  39 - 117 U/L   Total Bilirubin 0.4  0.3 - 1.2 mg/dL   GFR calc non Af Amer 86 (*) >90 mL/min   GFR calc Af Amer >90  >90 mL/min   Comment: (NOTE)     The eGFR has been calculated using the CKD EPI equation.     This calculation has not been validated in all clinical situations.     eGFR's persistently <90 mL/min signify possible Chronic Kidney     Disease.   Anion gap 11  5 - 15  LIPASE, BLOOD     Status: None   Collection Time    01/20/14  5:01 AM      Result Value Ref Range   Lipase 17  11 - 59 U/L  URINALYSIS, ROUTINE W REFLEX MICROSCOPIC     Status: Abnormal   Collection Time    01/20/14  6:19 AM      Result Value Ref Range   Color, Urine YELLOW  YELLOW   APPearance CLOUDY (*) CLEAR   Specific Gravity, Urine 1.027  1.005 - 1.030   pH 5.5  5.0 - 8.0   Glucose, UA NEGATIVE  NEGATIVE mg/dL   Hgb urine dipstick NEGATIVE  NEGATIVE   Bilirubin Urine NEGATIVE  NEGATIVE   Ketones, ur NEGATIVE  NEGATIVE mg/dL   Protein, ur NEGATIVE  NEGATIVE mg/dL   Urobilinogen, UA 0.2  0.0 - 1.0 mg/dL   Nitrite NEGATIVE  NEGATIVE   Leukocytes, UA NEGATIVE  NEGATIVE   Comment: MICROSCOPIC NOT DONE ON URINES WITH NEGATIVE PROTEIN, BLOOD, LEUKOCYTES, NITRITE, OR GLUCOSE <1000 mg/dL.  US Pelvis Complete  01/18/2014   SEE PROGRESS NOTES FOR RESULTS   Ct Abdomen Pelvis W Contrast  01/20/2014   CLINICAL DATA:  Lower abdominal pain. Remote appendectomy. History of nephrolithiasis  EXAM: CT ABDOMEN AND PELVIS WITH CONTRAST  TECHNIQUE: Multidetector CT imaging of the abdomen and pelvis was performed using the standard protocol following bolus administration of intravenous contrast.  CONTRAST:  124m OMNIPAQUE IOHEXOL 300 MG/ML  SOLN  COMPARISON:  02/22/2009  FINDINGS: BODY WALL: Unremarkable.  LOWER CHEST: Unremarkable.   ABDOMEN/PELVIS:  Liver: No focal abnormality.  Biliary: Chronic cystic like thickening of the gallbladder fundus, favor adenomyomatosis versus prominent gallbladder wall folding. No calcified gallstone.  Pancreas: Unremarkable.  Spleen: Unremarkable.  Adrenals: Unremarkable.  Kidneys and ureters: No hydronephrosis or stone.  Bladder: Unremarkable.  Reproductive: Unremarkable.  Bowel: There is dilated small bowel with fluid levels and fecalized contents proximal to a segment of bowel wall thickening by submucosal edema. Distal to this thickened segment, there is abrupt angulation of the bowel consistent with transition point. This is in the low central abdomen. There is no evidence of mass or inflammation at this level. The major vessels are patent. There is reactive mesenteric edema; no pneumatosis or pneumoperitoneum to suggest bowel perforation or devascularization. All the segments of bowel not filled with contrast show wall enhancement (enhancement elsewhere is obscured). Distal colonic diverticulosis which is relatively mild. There is no visible appendix, likely surgically absent.  Retroperitoneum: No mass or adenopathy.  Peritoneum: There is small volume perihepatic and interloop ascites which is likely reactive to the above.  Vascular: No acute abnormality.  OSSEOUS: Advanced lower lumbar facet osteoarthritis, resulting in grade 1 L5-S1 anterolisthesis. Focally advanced L2-3 degenerative disc disease with narrowing and sclerosis.  IMPRESSION: High-grade distal small bowel obstruction which is likely from adhesions, less likely from focal enteritis. There is mesenteric and bowel congestion but no evidence of bowel necrosis.   Electronically Signed   By: JJorje GuildM.D.   On: 01/20/2014 06:45   [place]  01/18/2014   SEE PROGRESS NOTES FOR RESULTS    Review of Systems  Constitutional: Negative.   HENT: Negative.   Eyes: Negative.   Respiratory: Negative.   Cardiovascular: Negative.    Gastrointestinal: Positive for constipation (occasional).  Genitourinary: Negative.   Musculoskeletal:       Osteoarthritis all over.  Mixed connective tissue/in remission.  No symptoms currently   Skin: Negative.   Neurological: Negative.   Endo/Heme/Allergies: Negative.   Psychiatric/Behavioral: Negative.     Blood pressure 119/68, pulse 61, temperature 98.4 F (36.9 C), temperature source Oral, resp. rate 19, last menstrual period 07/22/1989, SpO2 91.00%. Physical Exam  Constitutional: She is oriented to person, place, and time. She appears well-developed and well-nourished. No distress.  HENT:  Head: Normocephalic and atraumatic.  Nose: Nose normal.  NG in place  Eyes: Conjunctivae and EOM are normal. Pupils are equal, round, and reactive to light. Right eye exhibits no discharge. Left eye exhibits no discharge. No scleral icterus.  Neck: Normal range of motion. Neck supple. No JVD present. No tracheal deviation present. No thyromegaly present.  Cardiovascular: Normal rate and intact distal pulses.  Exam reveals no gallop.   No murmur heard. Respiratory: Effort normal and breath sounds normal. No respiratory distress. She has no wheezes. She has no rales. She exhibits no tenderness.  GI: Soft. She exhibits no distension and no mass. There is no tenderness. There is no rebound and no guarding.  Some bowel sounds  present.0 She has over a liter drained from NG currently, on 2nd cannister.  She says she is much better.  Musculoskeletal: She exhibits no edema and no tenderness.  Lymphadenopathy:    She has no cervical adenopathy.  Neurological: She is alert and oriented to person, place, and time. No cranial nerve deficit.  Skin: Skin is warm and dry. No rash noted. She is not diaphoretic. No pallor.  Psychiatric: She has a normal mood and affect. Her behavior is normal. Judgment and thought content normal.     Assessment/Plan 1.  SBO with hx of appendectomy/tubal ligation 2.   Thyroid nodules, on synthroid 3.  Mixed connective tissue disease, in remission 4.  Osteoarthritis - spine, neck and joints 5.  GERD/gas belching, negative EGD - Dr. Olevia Perches 6.  Hx of renal calculi  Plan:  Admit to observation, continue bowel rest, IV hydration, ambulate repeat film in AM.  She has bowel sounds now, and distension/pain are markedly better on my exam, and patient reporting.     Kanoelani Dobies 01/20/2014, 9:29 AM

## 2014-01-20 NOTE — ED Notes (Signed)
Patient transported to X-ray 

## 2014-01-21 ENCOUNTER — Observation Stay (HOSPITAL_COMMUNITY): Payer: Medicare Other

## 2014-01-21 LAB — BASIC METABOLIC PANEL
Anion gap: 7 (ref 5–15)
BUN: 10 mg/dL (ref 6–23)
CALCIUM: 8.4 mg/dL (ref 8.4–10.5)
CO2: 28 meq/L (ref 19–32)
Chloride: 109 mEq/L (ref 96–112)
Creatinine, Ser: 0.66 mg/dL (ref 0.50–1.10)
GFR calc Af Amer: >90 mL/min (ref >90)
GFR calc non Af Amer: 89 mL/min — ABNORMAL LOW (ref >90)
GLUCOSE: 88 mg/dL (ref 70–99)
POTASSIUM: 4.4 meq/L (ref 3.7–5.3)
Sodium: 144 mEq/L (ref 137–147)

## 2014-01-21 LAB — MRSA PCR SCREENING: MRSA by PCR: NEGATIVE

## 2014-01-21 LAB — CBC
HEMATOCRIT: 37.8 % (ref 36.0–46.0)
Hemoglobin: 12.4 g/dL (ref 12.0–15.0)
MCH: 30.5 pg (ref 26.0–34.0)
MCHC: 32.8 g/dL (ref 30.0–36.0)
MCV: 92.9 fL (ref 78.0–100.0)
PLATELETS: 205 10*3/uL (ref 150–400)
RBC: 4.07 MIL/uL (ref 3.87–5.11)
RDW: 12.9 % (ref 11.5–15.5)
WBC: 6.3 10*3/uL (ref 4.0–10.5)

## 2014-01-21 MED ORDER — KCL IN DEXTROSE-NACL 20-5-0.45 MEQ/L-%-% IV SOLN
INTRAVENOUS | Status: DC
  Administered 2014-01-21 – 2014-01-22 (×3): via INTRAVENOUS
  Administered 2014-01-22: 50 mL/h via INTRAVENOUS
  Filled 2014-01-21 (×4): qty 1000

## 2014-01-21 MED ORDER — SODIUM CHLORIDE 0.9 % IV BOLUS (SEPSIS)
500.0000 mL | Freq: Once | INTRAVENOUS | Status: AC
  Administered 2014-01-21: 500 mL via INTRAVENOUS

## 2014-01-21 NOTE — Progress Notes (Signed)
NG clamped x 6 hrs.  Reconnected to suction- residual only 75cc; pt states she had 1/2 cup of ice in last 2 hrs.  NG removed per order.  Pt tolerated procedure well.  No c/o nausea for this shift.  Pt c/o of mild to moderate pain in lower mid abd.

## 2014-01-21 NOTE — Progress Notes (Signed)
Subjective: Ab pain better, some nausea when moving, ng functional, no flatus, bm yesterday am  Objective: Vital signs in last 24 hours: Temp:  [98.1 F (36.7 C)-98.9 F (37.2 C)] 98.9 F (37.2 C) (07/03 0605) Pulse Rate:  [48-67] 67 (07/03 0605) Resp:  [18-19] 18 (07/03 0605) BP: (103-136)/(68-85) 136/82 mmHg (07/03 0605) SpO2:  [96 %-99 %] 96 % (07/03 0605) Weight:  [181 lb (82.101 kg)] 181 lb (82.101 kg) (07/02 1225) Last BM Date: 01/20/14  Intake/Output from previous day: 07/02 0701 - 07/03 0700 In: 1855 [I.V.:1855] Out: 1650 [Urine:400; Emesis/NG output:1250] Intake/Output this shift:    General appearance: no distress Resp: clear to auscultation bilaterally Cardio: regular rate and rhythm GI: soft, nontender, few bs  Lab Results:   Recent Labs  01/20/14 0501 01/21/14 0510  WBC 11.4* 6.3  HGB 13.2 12.4  HCT 39.4 37.8  PLT 206 205   BMET  Recent Labs  01/20/14 0501 01/21/14 0510  NA 144 144  K 4.6 4.4  CL 106 109  CO2 27 28  GLUCOSE 169* 88  BUN 19 10  CREATININE 0.73 0.66  CALCIUM 8.9 8.4   PT/INR No results found for this basename: LABPROT, INR,  in the last 72 hours ABG No results found for this basename: PHART, PCO2, PO2, HCO3,  in the last 72 hours  Studies/Results: Ct Abdomen Pelvis W Contrast  01/20/2014   CLINICAL DATA:  Lower abdominal pain. Remote appendectomy. History of nephrolithiasis  EXAM: CT ABDOMEN AND PELVIS WITH CONTRAST  TECHNIQUE: Multidetector CT imaging of the abdomen and pelvis was performed using the standard protocol following bolus administration of intravenous contrast.  CONTRAST:  141mL OMNIPAQUE IOHEXOL 300 MG/ML  SOLN  COMPARISON:  02/22/2009  FINDINGS: BODY WALL: Unremarkable.  LOWER CHEST: Unremarkable.  ABDOMEN/PELVIS:  Liver: No focal abnormality.  Biliary: Chronic cystic like thickening of the gallbladder fundus, favor adenomyomatosis versus prominent gallbladder wall folding. No calcified gallstone.  Pancreas:  Unremarkable.  Spleen: Unremarkable.  Adrenals: Unremarkable.  Kidneys and ureters: No hydronephrosis or stone.  Bladder: Unremarkable.  Reproductive: Unremarkable.  Bowel: There is dilated small bowel with fluid levels and fecalized contents proximal to a segment of bowel wall thickening by submucosal edema. Distal to this thickened segment, there is abrupt angulation of the bowel consistent with transition point. This is in the low central abdomen. There is no evidence of mass or inflammation at this level. The major vessels are patent. There is reactive mesenteric edema; no pneumatosis or pneumoperitoneum to suggest bowel perforation or devascularization. All the segments of bowel not filled with contrast show wall enhancement (enhancement elsewhere is obscured). Distal colonic diverticulosis which is relatively mild. There is no visible appendix, likely surgically absent.  Retroperitoneum: No mass or adenopathy.  Peritoneum: There is small volume perihepatic and interloop ascites which is likely reactive to the above.  Vascular: No acute abnormality.  OSSEOUS: Advanced lower lumbar facet osteoarthritis, resulting in grade 1 L5-S1 anterolisthesis. Focally advanced L2-3 degenerative disc disease with narrowing and sclerosis.  IMPRESSION: High-grade distal small bowel obstruction which is likely from adhesions, less likely from focal enteritis. There is mesenteric and bowel congestion but no evidence of bowel necrosis.   Electronically Signed   By: Jorje Guild M.D.   On: 01/20/2014 06:45   Dg Abd 2 Views  01/20/2014   CLINICAL DATA:  ABDOMINAL PAIN  EXAM: ABDOMEN - 2 VIEW  COMPARISON:  None.  FINDINGS: The bowel gas pattern is normal. There is no evidence of free  air. No radio-opaque calculi or other significant radiographic abnormality is seen. Enteric tube tip projecting in the expected region of the stomach. Contrast bilaterally in the urinary collecting systems and urinary bladder.  IMPRESSION:  Nonobstructive bowel gas pattern.   Electronically Signed   By: Margaree Mackintosh M.D.   On: 01/20/2014 12:03   Dg Abd 2 Views  01/20/2014   CLINICAL DATA:  Small bowel obstruction, diffuse abdominal pain  EXAM: ABDOMEN - 2 VIEW  COMPARISON:  CT abdomen pelvis of 01/20/2014  FINDINGS: An NG tube is now present coiling in the antrum of the stomach with the tip re-entering the fundus of the stomach. There is little change in gaseous distention of small bowel consistent with small bowel obstruction. No free air is seen on the left lateral decubitus of the abdomen.  IMPRESSION: NG tube coils in the stomach with tip re-entering the fundus of the stomach. No significant change in SBO pattern.   Electronically Signed   By: Ivar Drape M.D.   On: 01/20/2014 10:23    Anti-infectives: Anti-infectives   None      Assessment/Plan: SBO  1. Continue npo, ngt until bowel function returns hopefully will not need surgery 2. Will check xray today 3. Heparin, scds 4. pulm toilet oob  Krishauna Schatzman 01/21/2014

## 2014-01-21 NOTE — Progress Notes (Signed)
CSW consult requested by pt's daughter. Nsg informed pt's daughter  that CSW is willing to speak with her with pt's consent. Pt is alert, oriented and capable of decision making. Daughter has not pursed this request with CSW.   Werner Lean LCSW [phone]

## 2014-01-22 MED ORDER — LEVOTHYROXINE SODIUM 25 MCG PO TABS
50.0000 ug | ORAL_TABLET | ORAL | Status: DC
  Administered 2014-01-24: 50 ug via ORAL
  Filled 2014-01-22: qty 2

## 2014-01-22 MED ORDER — LEVOTHYROXINE SODIUM 25 MCG PO TABS
25.0000 ug | ORAL_TABLET | ORAL | Status: DC
  Administered 2014-01-22 – 2014-01-23 (×2): 25 ug via ORAL
  Filled 2014-01-22 (×3): qty 1

## 2014-01-22 MED ORDER — ENOXAPARIN SODIUM 40 MG/0.4ML ~~LOC~~ SOLN
40.0000 mg | SUBCUTANEOUS | Status: DC
  Administered 2014-01-22: 40 mg via SUBCUTANEOUS
  Filled 2014-01-22 (×3): qty 0.4

## 2014-01-22 MED ORDER — BISACODYL 10 MG RE SUPP
10.0000 mg | Freq: Once | RECTAL | Status: AC
  Administered 2014-01-22: 10 mg via RECTAL
  Filled 2014-01-22: qty 1

## 2014-01-22 NOTE — Progress Notes (Signed)
Subjective: Some soreness lower abdomen but passing flatus well, no n/v after tube out, ambulating, feels much better  Objective: Vital signs in last 24 hours: Temp:  [97.6 F (36.4 C)-98.1 F (36.7 C)] 97.6 F (36.4 C) (07/04 0531) Pulse Rate:  [52-57] 57 (07/04 0531) Resp:  [16-18] 18 (07/04 0531) BP: (105-125)/(66-74) 125/73 mmHg (07/04 0531) SpO2:  [94 %-96 %] 95 % (07/04 0531) Last BM Date: 01/20/14  Intake/Output from previous day: 06-Feb-2023 0701 - 07/04 0700 In: 2628.3 [I.V.:2053.3; NG/GT:75; IV Piggyback:500] Out: 3350 [Urine:3350] Intake/Output this shift:   Cv: rrr Pulm clear bilaterally Ab soft mild tenderness to deep palpation lower abdomen, bs present  Lab Results:   Recent Labs  01/20/14 0501 2014/02/05 0510  WBC 11.4* 6.3  HGB 13.2 12.4  HCT 39.4 37.8  PLT 206 205   BMET  Recent Labs  01/20/14 0501 Feb 05, 2014 0510  NA 144 144  K 4.6 4.4  CL 106 109  CO2 27 28  GLUCOSE 169* 88  BUN 19 10  CREATININE 0.73 0.66  CALCIUM 8.9 8.4   PT/INR No results found for this basename: LABPROT, INR,  in the last 72 hours ABG No results found for this basename: PHART, PCO2, PO2, HCO3,  in the last 72 hours  Studies/Results: Dg Abd 2 Views  02-05-2014   CLINICAL DATA:  Follow-up small bowel obstruction  EXAM: ABDOMEN - 2 VIEW  COMPARISON:  01/20/2014  FINDINGS: Nasogastric tube with the tip directed towards the fundus of the stomach. Mild gaseous distention of small bowel colon without pathologic dilatation. There is no bowel dilatation to suggest obstruction. There is no evidence of pneumoperitoneum, portal venous gas or pneumatosis. There are no pathologic calcifications along the expected course of the ureters.  The osseous structures are unremarkable.  IMPRESSION: Nonobstructive bowel gas pattern. Moderate amount of stool in the descending colon.   Electronically Signed   By: Kathreen Devoid   On: 02-05-14 09:12   Dg Abd 2 Views  01/20/2014   CLINICAL DATA:   ABDOMINAL PAIN  EXAM: ABDOMEN - 2 VIEW  COMPARISON:  None.  FINDINGS: The bowel gas pattern is normal. There is no evidence of free air. No radio-opaque calculi or other significant radiographic abnormality is seen. Enteric tube tip projecting in the expected region of the stomach. Contrast bilaterally in the urinary collecting systems and urinary bladder.  IMPRESSION: Nonobstructive bowel gas pattern.   Electronically Signed   By: Margaree Mackintosh M.D.   On: 01/20/2014 12:03   Dg Abd 2 Views  01/20/2014   CLINICAL DATA:  Small bowel obstruction, diffuse abdominal pain  EXAM: ABDOMEN - 2 VIEW  COMPARISON:  CT abdomen pelvis of 01/20/2014  FINDINGS: An NG tube is now present coiling in the antrum of the stomach with the tip re-entering the fundus of the stomach. There is little change in gaseous distention of small bowel consistent with small bowel obstruction. No free air is seen on the left lateral decubitus of the abdomen.  IMPRESSION: NG tube coils in the stomach with tip re-entering the fundus of the stomach. No significant change in SBO pattern.   Electronically Signed   By: Ivar Drape M.D.   On: 01/20/2014 10:23    Anti-infectives: Anti-infectives   None      Assessment/Plan: Resolving sbo  Clinically she is better, tol ng out, passing flatus, still with some soreness. Will start clears today, ambulate Check bmet in am for electrolytes She feels like she needs to have  bm so will give dulcolax supp today also Lovenox, scds   [facility] 01/22/2014

## 2014-01-23 LAB — BASIC METABOLIC PANEL
Anion gap: 10 (ref 5–15)
BUN: 5 mg/dL — ABNORMAL LOW (ref 6–23)
CHLORIDE: 107 meq/L (ref 96–112)
CO2: 27 meq/L (ref 19–32)
Calcium: 8.6 mg/dL (ref 8.4–10.5)
Creatinine, Ser: 0.63 mg/dL (ref 0.50–1.10)
GFR calc non Af Amer: >90 mL/min (ref >90)
GLUCOSE: 82 mg/dL (ref 70–99)
POTASSIUM: 3.9 meq/L (ref 3.7–5.3)
Sodium: 144 mEq/L (ref 137–147)

## 2014-01-23 MED ORDER — PROGESTERONE MICRONIZED 100 MG PO CAPS
100.0000 mg | ORAL_CAPSULE | Freq: Every day | ORAL | Status: DC
  Administered 2014-01-23: 100 mg via ORAL
  Filled 2014-01-23 (×2): qty 1

## 2014-01-23 MED ORDER — ESTRADIOL 0.05 MG/24HR TD PTWK
0.0500 mg | MEDICATED_PATCH | TRANSDERMAL | Status: DC
  Filled 2014-01-23: qty 1

## 2014-01-23 NOTE — Progress Notes (Addendum)
  Subjective: Passing some flatus, a couple small bms, some lower abdominal soreness  Objective: Vital signs in last 24 hours: Temp:  [98 F (36.7 C)-98.5 F (36.9 C)] 98.5 F (36.9 C) (07/05 0551) Pulse Rate:  [45-60] 45 (07/05 0551) Resp:  [18-20] 20 (07/05 0551) BP: (148-156)/(77-96) 156/96 mmHg (07/05 0551) SpO2:  [96 %-100 %] 96 % (07/05 0551) Last BM Date: 01/22/14  Intake/Output from previous day: 07/04 0701 - 07/05 0700 In: 1375.8 [I.V.:1375.8] Out: 3050 [Urine:3050] Intake/Output this shift:    General appearance: no distress Resp: clear to auscultation bilaterally Cardio: regular rate and rhythm GI: soft nontender, bs present, nondistended  Lab Results:   Recent Labs  01-24-2014 0510  WBC 6.3  HGB 12.4  HCT 37.8  PLT 205   BMET  Recent Labs  01/24/2014 0510 01/23/14 0505  NA 144 144  K 4.4 3.9  CL 109 107  CO2 28 27  GLUCOSE 88 82  BUN 10 5*  CREATININE 0.66 0.63  CALCIUM 8.4 8.6    Studies/Results: Dg Abd 2 Views  01/24/14   CLINICAL DATA:  Follow-up small bowel obstruction  EXAM: ABDOMEN - 2 VIEW  COMPARISON:  01/20/2014  FINDINGS: Nasogastric tube with the tip directed towards the fundus of the stomach. Mild gaseous distention of small bowel colon without pathologic dilatation. There is no bowel dilatation to suggest obstruction. There is no evidence of pneumoperitoneum, portal venous gas or pneumatosis. There are no pathologic calcifications along the expected course of the ureters.  The osseous structures are unremarkable.  IMPRESSION: Nonobstructive bowel gas pattern. Moderate amount of stool in the descending colon.   Electronically Signed   By: Kathreen Devoid   On: 01-24-2014 09:12    Assessment/Plan: Resolving sbo  Still with some lower abdominal pain subjectively, I think still resolving will give her full liquids today and advance to regular tomorrow am if doing well. Hopefully discharge home tomorrow Will restart estradiol patch and  other home meds today also per request Discussed physiology of sbo, treatment and recurrence with she and her husband today Daughter is concerned about some cognitive issues and her back pain, I have told her daughter these will need to be evaluated as an outpatient Her iv site is tender and a little red will change today  [facility] 01/23/2014

## 2014-01-24 ENCOUNTER — Telehealth: Payer: Self-pay | Admitting: Emergency Medicine

## 2014-01-24 ENCOUNTER — Encounter (HOSPITAL_COMMUNITY): Payer: Self-pay | Admitting: General Surgery

## 2014-01-24 DIAGNOSIS — E042 Nontoxic multinodular goiter: Secondary | ICD-10-CM | POA: Diagnosis present

## 2014-01-24 HISTORY — DX: Nontoxic multinodular goiter: E04.2

## 2014-01-24 LAB — IPS OTHER TISSUE BIOPSY

## 2014-01-24 MED ORDER — POLYETHYLENE GLYCOL 3350 17 G PO PACK: PACK | ORAL | Status: DC

## 2014-01-24 MED ORDER — POLYETHYLENE GLYCOL 3350 17 G PO PACK
17.0000 g | PACK | Freq: Every day | ORAL | Status: DC
  Administered 2014-01-24: 17 g via ORAL
  Filled 2014-01-24: qty 1

## 2014-01-24 MED ORDER — PSYLLIUM 95 % PO PACK: PACK | ORAL | Status: DC

## 2014-01-24 MED ORDER — PSYLLIUM 95 % PO PACK
1.0000 | PACK | Freq: Every day | ORAL | Status: DC
  Filled 2014-01-24: qty 1

## 2014-01-24 NOTE — Progress Notes (Signed)
Nurse reviewed discharge instructions with pt.  Pt verbalized understanding of discharge instructions, new medications and follow up appointment.  No concerns at time of discharge.

## 2014-01-24 NOTE — Progress Notes (Signed)
  Subjective: She is up putting on her make up.  Says her stomach is a bit sore, she has had some flatus and a little loose stool.  She does have some issues with constipation.  Over all she looks fine.  Objective: Vital signs in last 24 hours: Temp:  [98 F (36.7 C)-98.3 F (36.8 C)] 98 F (36.7 C) (07/05 2133) Pulse Rate:  [49-50] 50 (07/05 2133) Resp:  [18] 18 (07/05 2133) BP: (124-131)/(73-83) 131/83 mmHg (07/05 2133) SpO2:  [97 %-99 %] 99 % (07/05 2133) Last BM Date: 01/23/14 720 PO recorded Bland diet starting this AM, full liquids last PM No BM recorded Afebrile, VSS HR is low No labs Last film: Nonobstructive bowel gas pattern. Moderate amount of stool in the descending colon. 01/21/14   Intake/Output from previous day: 07/05 0701 - 07/06 0700 In: 1407.5 [P.O.:720; I.V.:687.5] Out: 450 [Urine:450] Intake/Output this shift:    General appearance: alert and cooperative GI: soft, non-tender; bowel sounds normal; no masses,  no organomegaly and she complains she feels a little sore.  Lab Results:  No results found for this basename: WBC, HGB, HCT, PLT,  in the last 72 hours  BMET  Recent Labs  01/23/14 0505  NA 144  K 3.9  CL 107  CO2 27  GLUCOSE 82  BUN 5*  CREATININE 0.63  CALCIUM 8.6   PT/INR No results found for this basename: LABPROT, INR,  in the last 72 hours   Recent Labs Lab 01/20/14 0501  AST 23  ALT 18  ALKPHOS 76  BILITOT 0.4  PROT 6.3  ALBUMIN 3.8     Lipase     Component Value Date/Time   LIPASE 17 01/20/2014 0501     Studies/Results: No results found.  Medications: . antiseptic oral rinse  15 mL Mouth Rinse q12n4p  . enoxaparin (LOVENOX) injection  40 mg Subcutaneous Q24H  . estradiol  0.05 mg Transdermal Weekly  . levothyroxine  25 mcg Oral Once per day on Sun Tue Thu Sat  . levothyroxine  50 mcg Oral Once per day on Mon Wed Fri  . polyethylene glycol  17 g Oral Daily  . progesterone  100 mg Oral Daily     Assessment/Plan 1. SBO with hx of appendectomy/tubal ligation;Uterine bx on 01/18/14  2. Thyroid nodules, on synthroid  3. Mixed connective tissue disease, in remission  4. Osteoarthritis - spine, neck and joints  5. GERD/gas belching, negative EGD - Dr. Olevia Perches  6. Hx of renal calculi   Plan:  Soft diet, Miralax, mobilize, possible home later today.     LOS: 4 days    Morgan [name] 01/24/2014

## 2014-01-24 NOTE — Progress Notes (Signed)
Recovering Agree w outpt bowel regimen Outpt f/u for numerous medical issues - defer to PCP

## 2014-01-24 NOTE — Telephone Encounter (Signed)
Spoke with patient and message from Dr. Charlies Constable given. She verbalized understanding of results and agreeable with plan going forward.  She has annual exam with Milford Cage, FNP scheduled for this month.  Routing to provider for final review. Patient agreeable to disposition. Will close encounter

## 2014-01-24 NOTE — Telephone Encounter (Signed)
Message copied by Michele Mcalpine on Mon Jan 24, 2014 11:26 AM ------      Message from: Elveria Rising      Created: Mon Jan 24, 2014 10:58 AM       Inform biospy is benign, c/w atrophy, if she rebleeds we could consider changing rx ------

## 2014-01-24 NOTE — Discharge Instructions (Signed)
Intestinal Obstruction Care After Please read the instructions outlined below. Refer to these instructions for the next few weeks. These discharge instructions provide you with general information on caring for yourself after surgery. Your caregiver may also give you specific instructions. While your treatment has been planned according to the most current medical practices available, unavoidable complications occasionally occur. If you have any problems or questions after discharge, please call your caregiver. HOME CARE INSTRUCTIONS   It is normal to be sore for a couple weeks following surgery. See your caregiver if this seems to be getting worse rather than better.  Take prescribed medicine as directed. Only take over-the-counter or prescription medicines for pain, discomfort, and or fever, as directed by your caregiver.  Use showers for bathing, until seen or as instructed.  Change dressings if necessary or as directed.  You may resume normal diet and activities as directed or allowed.  Liquids and soft foods may be easier to digest at first. Once you can tolerate liquid and soft foods, you can begin eating regular solid foods.  Avoid lifting or driving until you are instructed otherwise.  Make an appointment to see your caregiver for suture (stitches) or staple removal when instructed.  You may use ice on your incision for 15-20 minutes, 03-04 times per day for the first two days. SEEK MEDICAL CARE IF:   You see redness, swelling, or have increasing pain in wounds.  You have pus coming from your wound.  You develop an unexplained temperature over 102 F (38.9 C).  You have a bad smell coming from the wound or dressing.  You develop lightheadedness or feel faint. SEEK IMMEDIATE MEDICAL CARE IF:   You have increased bleeding from wounds.  You have increasing abdominal pain, repeated vomiting, dehydration or fainting.  You develop severe weakness, chest pain or back  pain  You develop blood in your vomit, stool or you have tarry stool.  You develop a rash.  You have difficulty breathing.  You develop any reaction or side effects to medicines given. Document Released: 01/25/2005 Document Revised: 09/30/2011 Document Reviewed: 08/11/2007 [facility] Patient Information 2015 [place], [place]. This information is not intended to replace advice given to you by your health care provider. Make sure you discuss any questions you have with your health care provider.  GETTING TO GOOD BOWEL HEALTH. Irregular bowel habits such as constipation and diarrhea can lead to many problems over time.  Having one soft bowel movement a day is the most important way to prevent further problems.  The anorectal canal is designed to handle stretching and feces to safely manage our ability to get rid of solid waste (feces, poop, stool) out of our body.  BUT, hard constipated stools can act like ripping concrete bricks and diarrhea can be a burning fire to this very sensitive area of our body, causing inflamed hemorrhoids, anal fissures, increasing risk is perirectal abscesses, abdominal pain/bloating, an making irritable bowel worse.     The goal: ONE SOFT BOWEL MOVEMENT A DAY!  To have soft, regular bowel movements:    Drink at least 8 tall glasses of water a day.     Take plenty of fiber.  Fiber is the undigested part of plant food that passes into the colon, acting s natures broom to encourage bowel motility and movement.  Fiber can absorb and hold large amounts of water. This results in a larger, bulkier stool, which is soft and easier to pass. Work gradually over several weeks up to  6 servings a day of fiber (25g a day even more if needed) in the form of: o Vegetables -- Root (potatoes, carrots, turnips), leafy green (lettuce, salad greens, celery, spinach), or cooked high residue (cabbage, broccoli, etc) o Fruit -- Fresh (unpeeled skin & pulp), Dried (prunes, apricots, cherries, etc  ),  or stewed ( applesauce)  o Whole grain breads, pasta, etc (whole wheat)  o Bran cereals    Bulking Agents -- This type of water-retaining fiber generally is easily obtained each day by one of the following:  o Psyllium bran -- The psyllium plant is remarkable because its ground seeds can retain so much water. This product is available as Metamucil, Konsyl, Effersyllium, Per Diem Fiber, or the less expensive generic preparation in drug and health food stores. Although labeled a laxative, it really is not a laxative.  o Methylcellulose -- This is another fiber derived from wood which also retains water. It is available as Citrucel. o Polyethylene Glycol - and artificial fiber commonly called Miralax or Glycolax.  It is helpful for people with gassy or bloated feelings with regular fiber o Flax Seed - a less gassy fiber than psyllium   No reading or other relaxing activity while on the toilet. If bowel movements take longer than 5 minutes, you are too constipated   AVOID CONSTIPATION.  High fiber and water intake usually takes care of this.  Sometimes a laxative is needed to stimulate more frequent bowel movements, but    Laxatives are not a good long-term solution as it can wear the colon out. o Osmotics (Milk of Magnesia, Fleets phosphosoda, Magnesium citrate, MiraLax, GoLytely) are safer than  o Stimulants (Senokot, Castor Oil, Dulcolax, Ex Lax)    o Do not take laxatives for more than 7days in a row.    IF SEVERELY CONSTIPATED, try a Bowel Retraining Program: o Do not use laxatives.  o Eat a diet high in roughage, such as bran cereals and leafy vegetables.  o Drink six (6) ounces of prune or apricot juice each morning.  o Eat two (2) large servings of stewed fruit each day.  o Take one (1) heaping tablespoon of a psyllium-based bulking agent twice a day. Use sugar-free sweetener when possible to avoid excessive calories.  o Eat a normal breakfast.  o Set aside 15 minutes after breakfast  to sit on the toilet, but do not strain to have a bowel movement.  o If you do not have a bowel movement by the third day, use an enema and repeat the above steps.    Controlling diarrhea o Switch to liquids and simpler foods for a few days to avoid stressing your intestines further. o Avoid dairy products (especially milk & ice cream) for a short time.  The intestines often can lose the ability to digest lactose when stressed. o Avoid foods that cause gassiness or bloating.  Typical foods include beans and other legumes, cabbage, broccoli, and dairy foods.  Every person has some sensitivity to other foods, so listen to our body and avoid those foods that trigger problems for you. o Adding fiber (Citrucel, Metamucil, psyllium, Miralax) gradually can help thicken stools by absorbing excess fluid and retrain the intestines to act more normally.  Slowly increase the dose over a few weeks.  Too much fiber too soon can backfire and cause cramping & bloating. o Probiotics (such as active yogurt, Align, etc) may help repopulate the intestines and colon with normal bacteria and calm down a sensitive  digestive tract.  Most studies show it to be of mild help, though, and such products can be costly. o Medicines:   Bismuth subsalicylate (ex. Kayopectate, Pepto Bismol) every 30 minutes for up to 6 doses can help control diarrhea.  Avoid if pregnant.   Loperamide (Immodium) can slow down diarrhea.  Start with two tablets (48m total) first and then try one tablet every 6 hours.  Avoid if you are having fevers or severe pain.  If you are not better or start feeling worse, stop all medicines and call your doctor for advice o Call your doctor if you are getting worse or not better.  Sometimes further testing (cultures, endoscopy, X-ray studies, bloodwork, etc) may be needed to help diagnose and treat the cause of the diarrhea.  Exercise to Stay Healthy Exercise helps you become and stay healthy. EXERCISE IDEAS AND  TIPS Choose exercises that:  You enjoy.  Fit into your day. You do not need to exercise really hard to be healthy. You can do exercises at a slow or medium level and stay healthy. You can:  Stretch before and after working out.  Try yoga, Pilates, or tai chi.  Lift weights.  Walk fast, swim, jog, run, climb stairs, bicycle, dance, or rollerskate.  Take aerobic classes. Exercises that burn about 150 calories:  Running 1  miles in 15 minutes.  Playing volleyball for 45 to 60 minutes.  Washing and waxing a car for 45 to 60 minutes.  Playing touch football for 45 minutes.  Walking 1  miles in 35 minutes.  Pushing a stroller 1  miles in 30 minutes.  Playing basketball for 30 minutes.  Raking leaves for 30 minutes.  Bicycling 5 miles in 30 minutes.  Walking 2 miles in 30 minutes.  Dancing for 30 minutes.  Shoveling snow for 15 minutes.  Swimming laps for 20 minutes.  Walking up stairs for 15 minutes.  Bicycling 4 miles in 15 minutes.  Gardening for 30 to 45 minutes.  Jumping rope for 15 minutes.  Washing windows or floors for 45 to 60 minutes. Document Released: 08/10/2010 Document Revised: 09/30/2011 Document Reviewed: 08/10/2010 [facility] Information 2015 [place] This information is not intended to replace advice given to you by your health care provider. Make sure you discuss any questions you have with your health care provider.  Intestinal Obstruction An intestinal obstruction is a blockage of the intestine. It can be caused by a physical blockage or by a problem of abnormal function of the intestine.  CAUSES   Adhesions from previous surgeries.  Cancer or tumor.  A hernia, which is a condition in which a portion of the bowel bulges out through an opening or weakness in the abdomen. This sometimes squeezes the bowel.  A swallowed object.  Blockage (impaction) with worms is common in third world countries.  A twisting of the  bowel or telescoping of a portion of the bowel into another portion (intussusceptions).  Anything that stops food from going through from the stomach to the anus. SYMPTOMS  Symptoms of bowel obstruction may include abdominal bloating, nausea, vomiting, explosive diarrhea, or explosive stool. You may not be able to hear your normal bowel sounds (such as "growling in your stomach"). You may also stop having bowel movements or passing gas. DIAGNOSIS  Usually this condition is diagnosed with a history and an examination. Often, lab studies (blood work) and X-rays may be used to find the cause. TREATMENT  The main treatment for this condition  is to rest the intestine. Often, the obstruction may relieve itself and allow the intestine to start working again. Think of the intestine like a balloon that is blown up (filled with trapped food and water that has squeezed into a hole or area that it cannot get through).   If the obstruction is complete, a nasogastric (NG) tube is passed through the nose and into the stomach. It is then connected to suction to keep the stomach emptied out. This also helps treat the nausea and vomiting.  If there is an imbalance in the electrolytes, they are corrected with intravenous fluids. These fluids have the proper chemicals in them to correct the problem.  If the reason for the blockage does not get better with conservative (nonsurgical) treatment, surgery may be necessary. Sometimes, surgery is done immediately if your surgeon knows that the problem is not going to get better with conservative treatment. PROGNOSIS  Depending on what the problem is, most of these problems can be treated by your caregivers with good results. Your caregiver will discuss with you the best course of action to take. FOLLOWING SURGERY Seek immediate medical attention if you have:  Increasing abdominal pain, repeated vomiting, dehydration, or fainting.  Severe weakness, chest pain, or back  pain.  Blood in your vomit or stool.  Tarry stool. Document Released: 09/28/2003 Document Revised: 11/02/2012 Document Reviewed: 02/26/2008 [facility] Patient Information 2015 [place], [place]. This information is not intended to replace advice given to you by your health care provider. Make sure you discuss any questions you have with your health care provider.

## 2014-01-24 NOTE — Discharge Summary (Signed)
Physician Discharge Summary  Patient ID: Morgan [name] MRN: [id number] DOB/AGE: 11-29-1945 68 y.o.  Admit date: 01/20/2014 Discharge date: 01/24/2014  Admission Diagnoses:  1. SBO with hx of appendectomy/tubal ligation  2. Thyroid nodules, on synthroid  3. Mixed connective tissue disease, in remission  4. Osteoarthritis - spine, neck and joints  5. GERD/gas belching, negative EGD - Dr. Olevia Perches  6. Hx of renal calculi  Discharge Diagnoses:  Same Principal Problem:   SBO (small bowel obstruction) Active Problems:   IBS   CONNECTIVE TISSUE DISEASE   Spinal stenosis   GERD   HEPATITIS, HX OF   Multiple thyroid nodules   PROCEDURES: none  Hospital Course: Pt came home from the beach and thought she ate some bad ham after returning. She started having abdominal pain around 3 PM yesterday. No nausea or vomiting at this point. She had a Uterine bx on 01/18/14 and was concerned this pain was from that also. She said symptoms got worse but she went to bed at 9 PM. She slept till about 2AM. She had tried some Miralax before bed, along with an Aleve. When she woke up pain was worse, distension was worse. She had a BM, but then developed severe nausea and vomiting. She said she only vomited x 1, but a very large quantity. She was transported to the ED, labs are OK; WBC is up to 11.4 glucose is up to 169, otherwise normal. CT scan shows high grade distal SBOwith mesenteric bowel congestion.. She has had an NG placed and has over 1 liter from this. Her symptoms are currently much improved. We are ask to see. She was admitted and place on bowel rest, hydration, NG tube was continued.  She made slow progress over the weekend and was ready for a soft diet by AM of 01/24/14.  She was also given some Miralax for constipation.  If she does well we anticipate discharge later today.  We will let her follow up with PCP and Rheumatologist for her chronic issues.  Disposition: 01-Home or Self Care      Medication List    ASK your doctor about these medications       aspirin 81 MG tablet  Take 81 mg by mouth 3 (three) times a week. Mon, Wed, Fri     Clobetasol Propionate Emulsion 0.05 % topical foam  Apply 1 application topically 2 (two) times a week.     CULTURELLE PO  Take 1 tablet by mouth daily.     diclofenac 75 MG EC tablet  Commonly known as:  VOLTAREN  Take 1 tablet (75 mg total) by mouth 2 (two) times daily.     estradiol 0.05 mg/24hr patch  Commonly known as:  CLIMARA - Dosed in mg/24 hr  Place 1 patch (0.05 mg total) onto the skin once a week.     HAIR/SKIN/NAILS PO  Take 1 tablet by mouth daily.     levothyroxine 50 MCG tablet  Commonly known as:  SYNTHROID, LEVOTHROID  Take 25-50 mcg by mouth See admin instructions. Take 50 mcg 3 times a week (Mon, Wed, Fri),25 mcg 4 times a week (Tues, Thurs, Sat, Sun)     misoprostol 200 MCG tablet  Commonly known as:  CYTOTEC  1 po morning of     naproxen sodium 220 MG tablet  Commonly known as:  ANAPROX  Take 220 mg by mouth 2 (two) times daily as needed (pain).     progesterone 100 MG capsule  Commonly  known as:  PROMETRIUM  Take 1 capsule (100 mg total) by mouth daily.     VALTREX 1000 MG tablet  Generic drug:  valACYclovir  Take 1,000 mg by mouth as needed (for fever blisters).     vitamin C 1000 MG tablet  Take 1,000 mg by mouth at bedtime.     VITAMIN D-3 PO  Take 1 capsule by mouth at bedtime.         SignedEarnstine Regal 01/24/2014, 10:06 AM

## 2014-01-25 NOTE — Discharge Summary (Signed)
Improved. F/u in office closely

## 2014-01-28 ENCOUNTER — Ambulatory Visit (INDEPENDENT_AMBULATORY_CARE_PROVIDER_SITE_OTHER): Payer: Medicare Other | Admitting: Family Medicine

## 2014-01-28 ENCOUNTER — Encounter: Payer: Self-pay | Admitting: Family Medicine

## 2014-01-28 ENCOUNTER — Telehealth: Payer: Self-pay | Admitting: Family Medicine

## 2014-01-28 ENCOUNTER — Ambulatory Visit (INDEPENDENT_AMBULATORY_CARE_PROVIDER_SITE_OTHER)
Admission: RE | Admit: 2014-01-28 | Discharge: 2014-01-28 | Disposition: A | Discharge status: Home / Self Care | Payer: Medicare Other | Source: Ambulatory Visit | Attending: Family Medicine | Admitting: Family Medicine

## 2014-01-28 VITALS — BP 126/78 | HR 51 | Temp 98.4°F | Wt 180.2 lb

## 2014-01-28 DIAGNOSIS — R143 Flatulence: Secondary | ICD-10-CM

## 2014-01-28 DIAGNOSIS — K56609 Unspecified intestinal obstruction, unspecified as to partial versus complete obstruction: Secondary | ICD-10-CM

## 2014-01-28 DIAGNOSIS — R140 Abdominal distension (gaseous): Secondary | ICD-10-CM | POA: Insufficient documentation

## 2014-01-28 DIAGNOSIS — R142 Eructation: Secondary | ICD-10-CM

## 2014-01-28 DIAGNOSIS — R141 Gas pain: Secondary | ICD-10-CM

## 2014-01-28 NOTE — Progress Notes (Signed)
Pre visit review using our clinic review tool, if applicable. No additional management support is needed unless otherwise documented below in the visit note. 

## 2014-01-28 NOTE — Telephone Encounter (Signed)
Patient Information:  Caller Name: Saje  Phone: [phone]  Patient: [birth year], Morgan [name]  Gender: Female  DOB: 02/12/1946  Age: 68 Years  PCP: Loura Pardon [facility])  Office Follow Up:  Does the office need to follow up with this patient?: Yes  Instructions For The Office: Please ask Dr. Glori Bickers if she needs to be seen today or if she can try home care and wait to be checked on Monday.  RN Note:  No appointments available today but is scheduled to come in on 01/31/14. RN Triage-Advised to go on clear liquid diet and use Dulcolax Suppository today and take a second dose of Miralax tonight. Please ask Dr. Glori Bickers if it is okay for her to wait till Monday to be seen since no appnts available today.  Symptoms  Reason For Call & Symptoms: Calling about being treated for Bowel Ostruction with small bowel messenteric congestion on 01/20/14 and discharged on 01/24/14. Today abdomen feels hard and is distended like it did on 01/20/14 and she feels sluggish. No pain, nausea or vomiting. She is eating normally but is only passing small amount of stool daily.  Took Miralax on 7/8 and last night at 0400. Voiding QS.  Reviewed Health History In EMR: Yes  Reviewed Medications In EMR: Yes  Reviewed Allergies In EMR: Yes  Reviewed Surgeries / Procedures: Yes  Date of Onset of Symptoms: 01/20/2014  Guideline(s) Used:  Abdominal Pain - Upper  Constipation  Disposition Per Guideline:   See Today in Office  Reason For Disposition Reached:   Abdomen is more swollen than usual  Advice Given:  General Constipation Instructions:  Eat a high fiber diet.  Drink adequate liquids.  Exercise regularly (even a daily 15 minute walk!).  Get into a rhythm - try to have a BM at the same time each day.  High Fiber Diet:  Try to eat fresh fruit and vegetables at each meal (peas, prunes, citrus, apples, beans, corn).  Eat more grain foods (bran flakes, bran muffins, graham crackers, oatmeal, brown rice, and whole  wheat bread). Popcorn is a source of fiber.  Patient Refused Recommendation:  Patient Will Follow Up With Office Later if okay with Dr. Glori Bickers to try clear liquids and treat constipation with Miralax, fiber and Dulcolax Suppository  No appointments available

## 2014-01-28 NOTE — Telephone Encounter (Signed)
With recent bowel obstruction I think pt needs eval today - see if pt can be seen by providers in our office, if none available may place on my schedule at 12:30pm.

## 2014-01-28 NOTE — Telephone Encounter (Signed)
Spoke with patient and added today at 1230.

## 2014-01-28 NOTE — Progress Notes (Signed)
BP 126/78  Pulse 51  Temp(Src) 98.4 F (36.9 C) (Oral)  Wt 180 lb 4 oz (81.761 kg)  SpO2 98%  LMP 07/22/1989   CC: abd pain  Subjective:    Patient ID: Morgan [name], female    DOB: [birth year], 69 y.o.   MRN: [id number]  HPI: Morgan [name] is a 68 y.o. female presenting on 01/28/2014 for Constipation   Recent hospitalization for bowel obstruction that resolved with NGT and non operative management. Records reviewed. CT with high grade distal SBO with mesenteric congestion/edema. Discharged on miralax, metamucil and lactobacillus daily.  She did have a normal BM prior to hospitalization.  Discharged home on Monday 01/24/2014. Presents today with persistent fatigue.  Mild nausea over last few days. Took miralax once today.  Has been having smaller daily BMs than normal. Normal flatus. Has only taken miralax twice since she's been home. Stomach feels bloated and mildly distended. Has not necessarily increased fiber in diet. Does not drink significant amt water. Has not started metamucil.  No current nausea/vomiting, good appetite and eating well, no fevers/chills.  CT ABDOMEN AND PELVIS WITH CONTRAST  TECHNIQUE:  Multidetector CT imaging of the abdomen and pelvis was performed  using the standard protocol following bolus administration of  intravenous contrast.  CONTRAST: 113mL OMNIPAQUE IOHEXOL 300 MG/ML SOLN  COMPARISON: 02/22/2009  FINDINGS:  BODY WALL: Unremarkable.  LOWER CHEST: Unremarkable.  ABDOMEN/PELVIS:  Liver: No focal abnormality.  Biliary: Chronic cystic like thickening of the gallbladder fundus,  favor adenomyomatosis versus prominent gallbladder wall folding. No  calcified gallstone.  Pancreas: Unremarkable.  Spleen: Unremarkable.  Adrenals: Unremarkable.  Kidneys and ureters: No hydronephrosis or stone.  Bladder: Unremarkable.  Reproductive: Unremarkable.  Bowel: There is dilated small bowel with fluid levels and fecalized  contents proximal to a  segment of bowel wall thickening by  submucosal edema. Distal to this thickened segment, there is abrupt  angulation of the bowel consistent with transition point. This is in  the low central abdomen. There is no evidence of mass or  inflammation at this level. The major vessels are patent. There is  reactive mesenteric edema; no pneumatosis or pneumoperitoneum to  suggest bowel perforation or devascularization. All the segments of  bowel not filled with contrast show wall enhancement (enhancement  elsewhere is obscured). Distal colonic diverticulosis which is  relatively mild. There is no visible appendix, likely surgically  absent.  Retroperitoneum: No mass or adenopathy.  Peritoneum: There is small volume perihepatic and interloop ascites  which is likely reactive to the above.  Vascular: No acute abnormality.  OSSEOUS: Advanced lower lumbar facet osteoarthritis, resulting in  grade 1 L5-S1 anterolisthesis. Focally advanced L2-3 degenerative  disc disease with narrowing and sclerosis.  IMPRESSION:  High-grade distal small bowel obstruction which is likely from  adhesions, less likely from focal enteritis. There is mesenteric and  bowel congestion but no evidence of bowel necrosis.  Electronically Signed  By: Jorje Guild M.D.  On: 01/20/2014 06:45  ABDOMEN - 2 VIEW  COMPARISON: 01/20/2014  FINDINGS:  Nasogastric tube with the tip directed towards the fundus of the  stomach. Mild gaseous distention of small bowel colon without  pathologic dilatation. There is no bowel dilatation to suggest  obstruction. There is no evidence of pneumoperitoneum, portal venous  gas or pneumatosis. There are no pathologic calcifications along the  expected course of the ureters.  The osseous structures are unremarkable.  IMPRESSION:  Nonobstructive bowel gas pattern. Moderate amount of stool in the  descending colon.  Electronically Signed  By: Kathreen Devoid  On: 01/21/2014 09:12   Relevant  past medical, surgical, family and social history reviewed and updated as indicated.  Allergies and medications reviewed and updated. Current Outpatient Prescriptions on File Prior to Visit  Medication Sig  . Ascorbic Acid (VITAMIN C) 1000 MG tablet Take 1,000 mg by mouth at bedtime.   Marland Kitchen aspirin 81 MG tablet Take 81 mg by mouth 3 (three) times a week. Mon, Wed, Fri  . Cholecalciferol (VITAMIN D-3 PO) Take 1 capsule by mouth at bedtime.   . Clobetasol Propionate Emulsion 0.05 % topical foam Apply 1 application topically 2 (two) times a week.   . estradiol (CLIMARA - DOSED IN MG/24 HR) 0.05 mg/24hr Place 1 patch (0.05 mg total) onto the skin once a week.  . Lactobacillus Rhamnosus, GG, (CULTURELLE PO) Take 1 tablet by mouth daily.   Marland Kitchen levothyroxine (SYNTHROID, LEVOTHROID) 50 MCG tablet Take 25-50 mcg by mouth See admin instructions. Take 50 mcg 3 times a week (Mon, Wed, Fri),25 mcg 4 times a week (Tues, [place], Sat, Sun)  . Multiple Vitamins-Minerals (HAIR/SKIN/NAILS PO) Take 1 tablet by mouth daily.   . naproxen sodium (ANAPROX) 220 MG tablet Take 220 mg by mouth 2 (two) times daily as needed (pain).  . polyethylene glycol (MIRALAX / GLYCOLAX) packet You may use this as needed for more acute constipation.  Follow label instructions.  . progesterone (PROMETRIUM) 100 MG capsule Take 1 capsule (100 mg total) by mouth daily.  . psyllium (HYDROCIL/METAMUCIL) 95 % PACK You can buy this at any drug store or grocery store.  You need to follow label directions and realize this is a long term treatment not an urgent one.  It will take several days to make a difference.  . valACYclovir (VALTREX) 1000 MG tablet Take 1,000 mg by mouth as needed (for fever blisters).    No current facility-administered medications on file prior to visit.    Review of Systems Per HPI unless specifically indicated above    Objective:    BP 126/78  Pulse 51  Temp(Src) 98.4 F (36.9 C) (Oral)  Wt 180 lb 4 oz (81.761 kg)   SpO2 98%  LMP 07/22/1989  Physical Exam  Nursing note and vitals reviewed. Constitutional: She appears well-developed and well-nourished. No distress.  HENT:  Mouth/Throat: Oropharynx is clear and moist. No oropharyngeal exudate.  Abdominal: Soft. Normal appearance and bowel sounds are normal. She exhibits no distension and no mass. There is no hepatosplenomegaly. There is no tenderness. There is no rigidity, no rebound, no guarding, no CVA tenderness and negative Murphy's sign.  Skin: Skin is warm and dry. No rash noted.  Bruising at site of heparin injections  Psychiatric: She has a normal mood and affect.       Assessment & Plan:   Problem List Items Addressed This Visit   SBO (small bowel obstruction)     Seems to have fully resolved. Check 2 view abd today to r/o return of obstruction.  Recommended start miralax 1 capful daily every day and then may start metamucil once feeling better. Recommended continued work towards more fiber in diet, recommended 6-8 glasses of water a day.    Relevant Orders      DG Abd 2 Views   Abdominal distension - Primary     Mild distension endorsed along with persistent mild fatigue after recent hospitalization. Benign abdominal exam today. Check 2 view abd to r/o obstruction, but she is having  regular stools and passing gas, good appetite and denies abd pain or nausea/vomiting. Discussed anticipated deconditioning after hospitalization and expected course of improvement - encouraged continued slow increase in activity, encouraged regular walking.    Relevant Orders      DG Abd 2 Views       Follow up plan: Return if symptoms worsen or fail to improve.

## 2014-01-28 NOTE — Assessment & Plan Note (Addendum)
Seems to have fully resolved. Check 2 view abd today to r/o return of obstruction.  Recommended start miralax 1 capful daily every day and then may start metamucil once feeling better. Recommended continued work towards more fiber in diet, recommended 6-8 glasses of water a day.

## 2014-01-28 NOTE — Assessment & Plan Note (Addendum)
Mild distension endorsed along with persistent mild fatigue after recent hospitalization. Benign abdominal exam today. Check 2 view abd to r/o obstruction, but she is having regular stools and passing gas, good appetite and denies abd pain or nausea/vomiting. Discussed anticipated deconditioning after hospitalization and expected course of improvement - encouraged continued slow increase in activity, encouraged regular walking. 2view overall ok on my read - want rad opinion on left sided possible air fluid level - so changed to STAT read. Discussed with patient.

## 2014-01-28 NOTE — Patient Instructions (Signed)
I think you are doing well. Fatigue should slowly improve with time. I do want you to start miralax daily - 17gm (or one capful) in 8oz fluid daily. Increase water intake - 6-8 8oz glasses of water a day. Stay active - try to walk some daily. Start metamucil when feeling better. If any fever, nausea/vomiting, abdominal pain or stop passing stool or gas, please seek urgent care. Good to meet you today, call us with questions.  Small Bowel Obstruction A small bowel obstruction is a blockage (obstruction) of the small intestine (small bowel). The small bowel is a long, slender tube that connects the stomach to the colon. Its job is to absorb nutrients from the fluids and foods you consume into the bloodstream.  CAUSES  There are many causes of intestinal blockage. The most common ones include:  Hernias. This is a more common cause in children than adults.  Inflammatory bowel disease (enteritis and colitis).  Twisting of the bowel (volvulus).  Tumors.  Scar tissue (adhesions) from previous surgery or radiation treatment.  Recent surgery. This may cause an acute small bowel obstruction called an ileus. SYMPTOMS   Abdominal pain. This may be dull cramps or sharp pain. It may occur in one area or may be present in the entire abdomen. Pain can range from mild to severe, depending on the degree of obstruction.  Nausea and vomiting. Vomit may be greenish or yellow bile color.  Distended or swollen stomach. Abdominal bloating is a common symptom.  Constipation.  Lack of passing gas.  Frequent belching.  Diarrhea. This may occur if runny stool is able to leak around the obstruction. DIAGNOSIS  Your caregiver can usually diagnose small bowel obstruction by taking a history, doing a physical exam, and taking X-rays. If the cause is unclear, a CT scan (computerized tomography) of your abdomen and pelvis may be needed. TREATMENT  Treatment of the blockage depends on the cause and how bad  the problem is.   Sometimes, the obstruction improves with bed rest and intravenous (IV) fluids.  Resting the bowel is very important. This means following a simple diet. Sometimes, a clear liquid diet may be required for several days.  Sometimes, a small tube (nasogastric tube) is placed into the stomach to decompress the bowel. When the bowel is blocked, it usually swells up like a balloon filled with air and fluids. Decompression means that the air and fluids are removed by suction through that tube. This can help with pain, discomfort, and nausea. It can also help the obstruction resolve faster.  Surgery may be required if other treatments do not work. Bowel obstruction from a hernia may require early surgery and can be an emergency procedure. Adhesions that cause frequent or severe obstructions may also require surgery. HOME CARE INSTRUCTIONS If your bowel obstruction is only partial or incomplete, you may be allowed to go home.  Get plenty of rest.  Follow your diet as directed by your caregiver.  Only consume clear liquids until your condition improves.  Avoid solid foods as instructed. SEEK IMMEDIATE MEDICAL CARE IF:  You have increased pain or cramping.  You vomit blood.  You have uncontrolled vomiting or nausea.  You cannot drink fluids due to vomiting or pain.  You develop confusion.  You begin feeling very dry or thirsty (dehydrated).  You have severe bloating.  You have chills.  You have a fever.  You feel extremely weak or you faint. MAKE SURE YOU:  Understand these instructions.  Will watch  your condition.  Will get help right away if you are not doing well or get worse. Document Released: 09/24/2005 Document Revised: 09/30/2011 Document Reviewed: 09/21/2010 [facility] Patient Information 2015 [place], [place]. This information is not intended to replace advice given to you by your health care provider. Make sure you discuss any questions you have with your  health care provider.

## 2014-01-31 ENCOUNTER — Ambulatory Visit: Payer: Medicare Other | Admitting: Family Medicine

## 2014-02-02 ENCOUNTER — Ambulatory Visit: Payer: MEDICARE | Admitting: Nurse Practitioner

## 2014-02-03 ENCOUNTER — Other Ambulatory Visit: Payer: Self-pay | Admitting: Nurse Practitioner

## 2014-02-03 NOTE — Telephone Encounter (Signed)
Last AEX: 02/01/14 Last Refill: Progesterone (02/01/13 #90, 3 refills) Estradiol (02/01/13 #12 patches, 3 refills) Last MMG: 12/29/13, Neg Current AEX:7 27/15  Please advise

## 2014-02-08 ENCOUNTER — Ambulatory Visit: Payer: MEDICARE | Admitting: Nurse Practitioner

## 2014-02-14 ENCOUNTER — Encounter: Payer: Self-pay | Admitting: Nurse Practitioner

## 2014-02-14 ENCOUNTER — Ambulatory Visit (INDEPENDENT_AMBULATORY_CARE_PROVIDER_SITE_OTHER): Payer: MEDICARE | Admitting: Nurse Practitioner

## 2014-02-14 VITALS — BP 122/70 | HR 60 | Ht 65.0 in | Wt 182.0 lb

## 2014-02-14 DIAGNOSIS — Z01419 Encounter for gynecological examination (general) (routine) without abnormal findings: Secondary | ICD-10-CM

## 2014-02-14 DIAGNOSIS — E042 Nontoxic multinodular goiter: Secondary | ICD-10-CM

## 2014-02-14 MED ORDER — PROGESTERONE MICRONIZED 100 MG PO CAPS: 100.0000 mg | ORAL_CAPSULE | Freq: Every day | ORAL | Status: DC

## 2014-02-14 MED ORDER — VALACYCLOVIR HCL 1 G PO TABS: 1000.0000 mg | ORAL_TABLET | ORAL | Status: DC | PRN

## 2014-02-14 MED ORDER — ESTRADIOL 0.05 MG/24HR TD PTWK: 0.0500 mg | MEDICATED_PATCH | TRANSDERMAL | Status: DC

## 2014-02-14 NOTE — Patient Instructions (Signed)

## 2014-02-14 NOTE — Progress Notes (Signed)
Patient ID: Morgan [name], female   DOB: Mar 02, 1946, 68 y.o.   MRN: [id number] 69 y.o. G3P3 Married Caucasian Fe here for annual exam.  Recent evaluation here for episode PMB without any missed doses of HRT.  She had SHGM and endo biopsy following Cytotec.  Then within 23 hours she developed intense lower abdominal pain.  She then presented to ED with She then went to ED with small bowel obstruction.  She had NG tube for 2 days and was able to decompress the bowel without surgery. they then introduced food and she was in the hospital  X 5 days.  Since discharge back to normal BM's and no pain.  She has many questions about the timing of our procedures and onset of pain.  There was no perforation of the uterus and procedure was without incident.  Patient's last menstrual period was 07/22/1989.          Sexually active: yes  The current method of family planning is tubal ligation.  Exercising: no The patient does not participate in regular exercise at present.  Smoker: no   Health Maintenance:  Pap: 10/01/2010 negative  MMG: 12/22/13, 3D, normal Colonoscopy: 9/25/ 2013 1 polyp repeat in 5 years  BMD: 2013 'normal' at PCP  TDaP: PCP maintains tetanus.  Shingles vaccine age 89  Labs: PCP maintains all labs and urine.    reports that she quit smoking about 48 years ago. Her smoking use included Cigarettes. She smoked 0.00 packs per day. She has never used smokeless tobacco. She reports that she does not drink alcohol or use illicit drugs.  Past Medical History  Diagnosis Date  . HLD (hyperlipidemia)   . Osteoarthritis of cervical spine   . Connective tissue disease   . DDD (degenerative disc disease), cervical   . DDD (degenerative disc disease), lumbar   . Spinal stenosis   . Obesity   . Endometrial polyp     not resolved with D&C (following)  . Bradycardia   . PONV (postoperative nausea and vomiting)   . GERD (gastroesophageal reflux disease)     gas/belching issues-was on protonix for  short time-not since 6 months  . Mixed connective tissue disease diagnosed 5 years ago    no problems since diagnosis  . Allergy   . Renal calculi   . Multiple thyroid nodules 01/24/2014    Past Surgical History  Procedure Laterality Date  . Hand surgery      carpal tunnel release bilateral Dr. Daylene Katayama  . Tonsillectomy    . Appendectomy    . Dilation and curettage of uterus      fibroids  . Dilation and curettage of uterus  01/2005    endometrial polyp  . Endometrial biopsy  2008    fibroid  . Tubal ligation      1973  . Colonoscopy    . Upper gastrointestinal endoscopy    . Hysteroscopic resection       polyps of PMB    Current Outpatient Prescriptions  Medication Sig Dispense Refill  . Ascorbic Acid (VITAMIN C) 1000 MG tablet Take 1,000 mg by mouth at bedtime.       Marland Kitchen aspirin 81 MG tablet Take 81 mg by mouth every Monday, Wednesday, and Friday.       . Cholecalciferol (VITAMIN D-3 PO) Take 1 capsule by mouth at bedtime.       . Clobetasol Propionate Emulsion 0.05 % topical foam Apply 1 application topically 2 (two) times a week.       Marland Kitchen  diclofenac (VOLTAREN) 75 MG EC tablet Take 75 mg by mouth 2 (two) times daily as needed.      Marland Kitchen estradiol (CLIMARA - DOSED IN MG/24 HR) 0.05 mg/24hr patch Place 1 patch (0.05 mg total) onto the skin once a week.  12 patch  3  . Lactobacillus Rhamnosus, GG, (CULTURELLE PO) Take 1 tablet by mouth daily.       Marland Kitchen levothyroxine (SYNTHROID, LEVOTHROID) 50 MCG tablet Take 50 mcg 3 times a week (Mon, Wed, Fri),25 mcg 4 times a week (Tues, [place], Sat, Sun)      . Multiple Vitamins-Minerals (HAIR/SKIN/NAILS PO) Take 1 tablet by mouth daily.       . naproxen sodium (ANAPROX) 220 MG tablet Take 220 mg by mouth 2 (two) times daily as needed (pain).      . polyethylene glycol (MIRALAX / GLYCOLAX) packet You may use this as needed for more acute constipation.  Follow label instructions.  14 each  0  . progesterone (PROMETRIUM) 100 MG capsule Take 1 capsule  (100 mg total) by mouth daily.  90 capsule  3  . psyllium (HYDROCIL/METAMUCIL) 95 % PACK You can buy this at any drug store or grocery store.  You need to follow label directions and realize this is a long term treatment not an urgent one.  It will take several days to make a difference.  56 each    . valACYclovir (VALTREX) 1000 MG tablet Take 1 tablet (1,000 mg total) by mouth as needed (for fever blisters).  30 tablet  5   No current facility-administered medications for this visit.    Family History  Problem Relation Age of Onset  . Coronary artery disease Father   . Heart attack Father     deceased at 41  . Diabetes Maternal Grandmother   . Goiter Maternal Grandmother   . Diabetes Cousin   . Cancer Grandchild     rare type (dying at age 35)  . Colon cancer Neg Hx   . Esophageal cancer Neg Hx   . Stomach cancer Neg Hx   . Rectal cancer Neg Hx     ROS:  Pertinent items are noted in HPI.  Otherwise, a comprehensive ROS was negative.  Exam:   BP 122/70  Pulse 60  Ht 5\' 5"  (1.651 m)  Wt 182 lb (82.555 kg)  BMI 30.29 kg/m2  LMP 07/22/1989 Height: 5\' 5"  (165.1 cm)  Ht Readings from Last 3 Encounters:  02/14/14 5\' 5"  (1.651 m)  01/20/14 5\' 5"  (1.651 m)  01/18/14 5\' 5"  (1.651 m)    General appearance: alert, cooperative and appears stated age Head: Normocephalic, without obvious abnormality, atraumatic Neck: no adenopathy, supple, symmetrical, trachea midline and thyroid normal to inspection and palpation, small nodules bilaterally. Lungs: clear to auscultation bilaterally Breasts: normal appearance, no masses or tenderness Heart: regular rate and rhythm Abdomen: soft, non-tender; no masses,  no organomegaly Extremities: extremities normal, atraumatic, no cyanosis or edema Skin: Skin color, texture, turgor normal. No rashes or lesions Lymph nodes: Cervical, supraclavicular, and axillary nodes normal. No abnormal inguinal nodes palpated Neurologic: Grossly  normal   Pelvic: External genitalia:  no lesions              Urethra:  normal appearing urethra with no masses, tenderness or lesions              Bartholin's and Skene's: normal                 Vagina:  normal appearing vagina with normal color and discharge, no lesions              Cervix: anteverted              Pap taken: No. Bimanual Exam:  Uterus:  normal size, contour, position, consistency, mobility, non-tender              Adnexa: no mass, fullness, tenderness               Rectovaginal: Confirms               Anus:  normal sphincter tone, no lesions  A:  Well Woman with normal exam  Postmenopausal on HRT  S/P endo biopsy 01/18/14  Recent hospital for small bowel obstruction 01/20/14  Hysteroscopic resection of endo polyps 02/2011  P:   Reviewed health and wellness pertinent to exam  Pap smear not taken today  Mammogram is due 6/16  Refill on HRT climara patch 0.0.05mg  and Prometrium 100 mg for year.  She will try this fall to extend out her HRT and slowly taper off.  She has tried twice before and has not been abe to stop secondary to vaso symptoms.  She has never tapered off.  I suggested she extend the patch out from 7 = 9 days and slowly move out from there.  She should continue with same dose of Prometrium while she is tapering until she has stopped the patch. Then can DC Prometrium.  Counseled with risk of CVA, DVT, cancer, etc.  Counseled on breast self exam, mammography screening, use and side effects of HRT, adequate intake of calcium and vitamin D, diet and exercise, Kegel's exercises return annually or prn  An After Visit Summary was printed and given to the patient.   Since patient has many questions about the biopsy and onset of pain.  I have informed her that I will speak to Dr. Sabra Heck this pm.  Dr. Sabra Heck suggest that Dr. Charlies Constable call her and also review the procedure.

## 2014-02-15 ENCOUNTER — Telehealth: Payer: Self-pay | Admitting: Gynecology

## 2014-02-15 NOTE — Telephone Encounter (Signed)
F/u after annual yesterday, pt is without further concerns.  Understands that SBO can occur years after prior surgery. revewed pathology and hospitallizatoin

## 2014-02-17 NOTE — Progress Notes (Signed)
Encounter reviewed by Dr. Gussie Murton Silva.  

## 2014-02-18 NOTE — Progress Notes (Signed)
Note reviewed, agree with plan.  Pamella Samons, MD  

## 2014-03-07 ENCOUNTER — Telehealth: Payer: Self-pay | Admitting: Nurse Practitioner

## 2014-03-07 NOTE — Telephone Encounter (Signed)
Patient is calling about recieveing a letter saying her claim was denied wanted to speak with Morgan [name] about it. Said she is leaving in 30 mins

## 2014-04-07 ENCOUNTER — Telehealth: Payer: Self-pay | Admitting: Family Medicine

## 2014-04-07 DIAGNOSIS — E785 Hyperlipidemia, unspecified: Secondary | ICD-10-CM

## 2014-04-07 DIAGNOSIS — E039 Hypothyroidism, unspecified: Secondary | ICD-10-CM

## 2014-04-07 DIAGNOSIS — Z0000 Encounter for general adult medical examination without abnormal findings: Secondary | ICD-10-CM | POA: Insufficient documentation

## 2014-04-07 NOTE — Telephone Encounter (Signed)
Message copied by Abner Greenspan on Thu Apr 07, 2014  5:11 PM ------      Message from: Ellamae Sia      Created: Wed Apr 06, 2014  5:58 PM      Regarding: Lab orders for Friday, 9.18.15       Patient is scheduled for CPX labs, please order future labs, Thanks , Terri       ------

## 2014-04-08 ENCOUNTER — Other Ambulatory Visit (INDEPENDENT_AMBULATORY_CARE_PROVIDER_SITE_OTHER): Payer: Medicare Other

## 2014-04-08 DIAGNOSIS — E785 Hyperlipidemia, unspecified: Secondary | ICD-10-CM

## 2014-04-08 DIAGNOSIS — Z0000 Encounter for general adult medical examination without abnormal findings: Secondary | ICD-10-CM

## 2014-04-08 DIAGNOSIS — E039 Hypothyroidism, unspecified: Secondary | ICD-10-CM

## 2014-04-08 LAB — LIPID PANEL
CHOLESTEROL: 217 mg/dL — AB (ref 0–200)
HDL: 45 mg/dL (ref >39.00)
LDL CALC: 157 mg/dL — AB (ref 0–99)
NonHDL: 172
Total CHOL/HDL Ratio: 5
Triglycerides: 75 mg/dL (ref 0.0–149.0)
VLDL: 15 mg/dL (ref 0.0–40.0)

## 2014-04-08 LAB — CBC WITH DIFFERENTIAL/PLATELET
BASOS ABS: 0 10*3/uL (ref 0.0–0.1)
Basophils Relative: 0.4 % (ref 0.0–3.0)
EOS ABS: 0.1 10*3/uL (ref 0.0–0.7)
EOS PCT: 1.9 % (ref 0.0–5.0)
HCT: 38.5 % (ref 36.0–46.0)
Hemoglobin: 13.1 g/dL (ref 12.0–15.0)
Lymphocytes Relative: 29.4 % (ref 12.0–46.0)
Lymphs Abs: 1.4 10*3/uL (ref 0.7–4.0)
MCHC: 34 g/dL (ref 30.0–36.0)
MCV: 91 fl (ref 78.0–100.0)
MONO ABS: 0.6 10*3/uL (ref 0.1–1.0)
Monocytes Relative: 12.3 % — ABNORMAL HIGH (ref 3.0–12.0)
Neutro Abs: 2.7 10*3/uL (ref 1.4–7.7)
Neutrophils Relative %: 56 % (ref 43.0–77.0)
PLATELETS: 212 10*3/uL (ref 150.0–400.0)
RBC: 4.23 Mil/uL (ref 3.87–5.11)
RDW: 12.9 % (ref 11.5–15.5)
WBC: 4.9 10*3/uL (ref 4.0–10.5)

## 2014-04-08 LAB — COMPREHENSIVE METABOLIC PANEL
ALK PHOS: 73 U/L (ref 39–117)
ALT: 20 U/L (ref 0–35)
AST: 23 U/L (ref 0–37)
Albumin: 4.3 g/dL (ref 3.5–5.2)
BILIRUBIN TOTAL: 0.7 mg/dL (ref 0.2–1.2)
BUN: 13 mg/dL (ref 6–23)
CO2: 27 mEq/L (ref 19–32)
Calcium: 9 mg/dL (ref 8.4–10.5)
Chloride: 106 mEq/L (ref 96–112)
Creatinine, Ser: 0.7 mg/dL (ref 0.4–1.2)
GFR: 87.1 mL/min (ref >60.00)
GLUCOSE: 91 mg/dL (ref 70–99)
Potassium: 4.2 mEq/L (ref 3.5–5.1)
SODIUM: 139 meq/L (ref 135–145)
TOTAL PROTEIN: 6.9 g/dL (ref 6.0–8.3)

## 2014-04-08 LAB — TSH: TSH: 0.37 u[IU]/mL (ref 0.35–4.50)

## 2014-04-13 ENCOUNTER — Ambulatory Visit (INDEPENDENT_AMBULATORY_CARE_PROVIDER_SITE_OTHER): Payer: Medicare Other | Admitting: Family Medicine

## 2014-04-13 ENCOUNTER — Encounter: Payer: Self-pay | Admitting: Family Medicine

## 2014-04-13 VITALS — BP 126/68 | HR 53 | Temp 98.5°F | Ht 67.75 in | Wt 181.2 lb

## 2014-04-13 DIAGNOSIS — E785 Hyperlipidemia, unspecified: Secondary | ICD-10-CM

## 2014-04-13 DIAGNOSIS — J3089 Other allergic rhinitis: Secondary | ICD-10-CM

## 2014-04-13 DIAGNOSIS — E663 Overweight: Secondary | ICD-10-CM

## 2014-04-13 DIAGNOSIS — Z0000 Encounter for general adult medical examination without abnormal findings: Secondary | ICD-10-CM | POA: Insufficient documentation

## 2014-04-13 DIAGNOSIS — J309 Allergic rhinitis, unspecified: Secondary | ICD-10-CM | POA: Insufficient documentation

## 2014-04-13 DIAGNOSIS — J302 Other seasonal allergic rhinitis: Secondary | ICD-10-CM

## 2014-04-13 DIAGNOSIS — Z23 Encounter for immunization: Secondary | ICD-10-CM

## 2014-04-13 DIAGNOSIS — E039 Hypothyroidism, unspecified: Secondary | ICD-10-CM

## 2014-04-13 MED ORDER — FLUTICASONE PROPIONATE 50 MCG/ACT NA SUSP: 2.0000 | Freq: Every day | NASAL | Status: DC

## 2014-04-13 MED ORDER — PRAVASTATIN SODIUM 10 MG PO TABS: 5.0000 mg | ORAL_TABLET | Freq: Every day | ORAL | Status: DC

## 2014-04-13 NOTE — Progress Notes (Signed)
Subjective:    Patient ID: Morgan [name], female    DOB: [birth year], 68 y.o.   MRN: [id number]  HPI I have personally reviewed the Medicare Annual Wellness questionnaire and have noted 1. The patient's medical and social history 2. Their use of alcohol, tobacco or illicit drugs 3. Their current medications and supplements 4. The patient's functional ability including ADL's, fall risks, home safety risks and hearing or visual             impairment. 5. Diet and physical activities 6. Evidence for depression or mood disorders  The patients weight, height, BMI have been recorded in the chart and visual acuity is per eye clinic.  I have made referrals, counseling and provided education to the patient based review of the above and I have provided the pt with a written personalized care plan for preventive services.  Has been doing fairly well overall  Recent auto immune w/u with Dr Sunday Spillers better overall  Her OA is all over  She is also shrinking in ht   See scanned forms.  Routine anticipatory guidance given to patient.  See health maintenance. Colon cancer screening 9/13 - 5 year recall with polyps  Breast cancer screening 6/15 -nl  Self breast exam- no lumps or changes  Had a gyn visit - about a month ago / she is on HRT (she cannot tolerate being off of them so far) Flu vaccine-will get today Tetanus vaccine 7/08  Pneumovax 2/14  Zoster vaccine 08  Advance directive-- has a living will and POA Cognitive function addressed- see scanned forms- and if abnormal then additional documentation follows. No problems   PMH and SH reviewed  Meds, vitals, and allergies reviewed.   ROS: See HPI.  Otherwise negative.     Hyperlipidemia  Prev on pravastatin  Lab Results  Component Value Date   CHOL 217* 04/08/2014   CHOL 158 09/10/2012   CHOL 168 08/01/2011   Lab Results  Component Value Date   HDL 45.00 04/08/2014   HDL 49.30 09/10/2012   HDL 54.70 08/01/2011   Lab  Results  Component Value Date   LDLCALC 157* 04/08/2014   LDLCALC 92 09/10/2012   LDLCALC 100* 08/01/2011   Lab Results  Component Value Date   TRIG 75.0 04/08/2014   TRIG 86.0 09/10/2012   TRIG 66.0 08/01/2011   Lab Results  Component Value Date   CHOLHDL 5 04/08/2014   CHOLHDL 3 09/10/2012   CHOLHDL 3 08/01/2011   Lab Results  Component Value Date   LDLDIRECT 148.8 10/09/2010   LDLDIRECT 166.4 08/09/2010   LDLDIRECT 144.5 01/29/2008  she stopped pravastatin due to worries about memory   Diet -has been good    Hypothyroid Followed by Dr Ronnald Collum   Allergy symptoms - needs a px for flonase   Patient Active Problem List   Diagnosis Date Noted  . Encounter for Medicare annual wellness exam 04/13/2014  . Routine general medical examination at a health care facility 04/07/2014  . Abdominal distension 01/28/2014  . Multiple thyroid nodules 01/24/2014  . SBO (small bowel obstruction) 01/20/2014  . Low back strain 01/07/2014  . DDD (degenerative disc disease), cervical   . DDD (degenerative disc disease), lumbar   . Spinal stenosis   . Overweight   . TINNITUS 06/08/2010  . NONSPECIFIC ABNORMAL TOXICOLOGICAL FINDINGS 05/11/2010  . HYPOTHYROIDISM 04/18/2010  . BRADYCARDIA 05/31/2008  . GERD 05/03/2008  . DYSPEPSIA 05/03/2008  . DIVERTICULOSIS OF COLON 04/26/2008  . HEPATITIS, HX  OF 04/26/2008  . COLONIC POLYPS, HYPERPLASTIC, HX OF 04/26/2008  . HEMORRHOIDS, INTERNAL 01/11/2008  . DERMATITIS, SEBORRHEIC NEC 01/28/2007  . HYPERLIPIDEMIA 01/19/2007  . CARPAL TUNNEL SYNDROME, BILATERAL 01/19/2007  . IBS 01/19/2007  . OVERACTIVE BLADDER 01/19/2007  . POSTMENOPAUSAL STATUS 01/19/2007  . ECZEMA 01/19/2007  . CONNECTIVE TISSUE DISEASE 01/19/2007  . OSTEOARTHRITIS 01/19/2007  . ALLERGY 01/19/2007   Past Medical History  Diagnosis Date  . HLD (hyperlipidemia)   . Osteoarthritis of cervical spine   . Connective tissue disease   . DDD (degenerative disc disease), cervical   .  DDD (degenerative disc disease), lumbar   . Spinal stenosis   . Obesity   . Endometrial polyp     not resolved with D&C (following)  . Bradycardia   . PONV (postoperative nausea and vomiting)   . GERD (gastroesophageal reflux disease)     gas/belching issues-was on protonix for short time-not since 6 months  . Mixed connective tissue disease diagnosed 5 years ago    no problems since diagnosis  . Allergy   . Renal calculi   . Multiple thyroid nodules 01/24/2014   Past Surgical History  Procedure Laterality Date  . Hand surgery      carpal tunnel release bilateral Dr. Daylene Katayama  . Tonsillectomy    . Appendectomy    . Dilation and curettage of uterus      fibroids  . Dilation and curettage of uterus  01/2005    endometrial polyp  . Endometrial biopsy  2008    fibroid  . Tubal ligation      1973  . Colonoscopy    . Upper gastrointestinal endoscopy    . Hysteroscopic resection       polyps of PMB   History  Substance Use Topics  . Smoking status: Former Smoker    Types: Cigarettes    Quit date: 07/22/1965  . Smokeless tobacco: Never Used  . Alcohol Use: No   Family History  Problem Relation Age of Onset  . Coronary artery disease Father   . Heart attack Father     deceased at 35  . Diabetes Maternal Grandmother   . Goiter Maternal Grandmother   . Diabetes Cousin   . Cancer Grandchild     rare type (dying at age 52)  . Colon cancer Neg Hx   . Esophageal cancer Neg Hx   . Stomach cancer Neg Hx   . Rectal cancer Neg Hx    Allergies  Allergen Reactions  . Codeine Nausea And Vomiting  . Hydrocodone-Acetaminophen     REACTION: reaction not known  . Penicillins Hives   Current Outpatient Prescriptions on File Prior to Visit  Medication Sig Dispense Refill  . Ascorbic Acid (VITAMIN C) 1000 MG tablet Take 1,000 mg by mouth at bedtime.       Marland Kitchen aspirin 81 MG tablet Take 81 mg by mouth every Monday, Wednesday, and Friday.       . Cholecalciferol (VITAMIN D-3 PO) Take 1  capsule by mouth at bedtime.       . Clobetasol Propionate Emulsion 0.05 % topical foam Apply 1 application topically 2 (two) times a week.       . diclofenac (VOLTAREN) 75 MG EC tablet Take 75 mg by mouth 2 (two) times daily as needed.      Marland Kitchen estradiol (CLIMARA - DOSED IN MG/24 HR) 0.05 mg/24hr patch Place 1 patch (0.05 mg total) onto the skin once a week.  12 patch  3  .  Lactobacillus Rhamnosus, GG, (CULTURELLE PO) Take 1 tablet by mouth daily.       Marland Kitchen levothyroxine (SYNTHROID, LEVOTHROID) 50 MCG tablet Take 50 mcg 3 times a week (Mon, Wed, Fri),25 mcg 4 times a week (Tues, [place], Sat, Sun)      . Multiple Vitamins-Minerals (HAIR/SKIN/NAILS PO) Take 1 tablet by mouth daily.       . naproxen sodium (ANAPROX) 220 MG tablet Take 220 mg by mouth 2 (two) times daily as needed (pain).      . polyethylene glycol (MIRALAX / GLYCOLAX) packet You may use this as needed for more acute constipation.  Follow label instructions.  14 each  0  . progesterone (PROMETRIUM) 100 MG capsule Take 1 capsule (100 mg total) by mouth daily.  90 capsule  3  . psyllium (HYDROCIL/METAMUCIL) 95 % PACK You can buy this at any drug store or grocery store.  You need to follow label directions and realize this is a long term treatment not an urgent one.  It will take several days to make a difference.  56 each    . valACYclovir (VALTREX) 1000 MG tablet Take 1 tablet (1,000 mg total) by mouth as needed (for fever blisters).  30 tablet  5   No current facility-administered medications on file prior to visit.     Review of Systems    Review of Systems  Constitutional: Negative for fever, appetite change, fatigue and unexpected weight change.  ENT pos for congestion and rhinorrhea from allergies in season  Eyes: Negative for pain and visual disturbance.  Respiratory: Negative for cough and shortness of breath.   Cardiovascular: Negative for cp or palpitations    Gastrointestinal: Negative for nausea, diarrhea and constipation.    Genitourinary: Negative for urgency and frequency. pos for vasomotor symptoms when she is off HRT  Skin: Negative for pallor or rash   MSK pos for joint pain from OA  Neurological: Negative for weakness, light-headedness, numbness and headaches.  Hematological: Negative for adenopathy. Does not bruise/bleed easily.  Psychiatric/Behavioral: Negative for dysphoric mood. The patient is not nervous/anxious.      Objective:   Physical Exam  Constitutional: She appears well-developed and well-nourished. No distress.  obese and well appearing   HENT:  Head: Normocephalic and atraumatic.  Right Ear: External ear normal.  Left Ear: External ear normal.  Nose: Nose normal.  Mouth/Throat: Oropharynx is clear and moist.  Eyes: Conjunctivae and EOM are normal. Pupils are equal, round, and reactive to light. Right eye exhibits no discharge. Left eye exhibits no discharge. No scleral icterus.  Neck: Normal range of motion. Neck supple. No JVD present. Carotid bruit is not present. Thyromegaly present.  Cardiovascular: Normal rate, regular rhythm, normal heart sounds and intact distal pulses.  Exam reveals no gallop.   Pulmonary/Chest: Effort normal and breath sounds normal. No respiratory distress. She has no wheezes. She has no rales.  Abdominal: Soft. Bowel sounds are normal. She exhibits no distension and no mass. There is no tenderness.  Musculoskeletal: She exhibits no edema and no tenderness.  Lymphadenopathy:    She has no cervical adenopathy.  Neurological: She is alert. She has normal reflexes. No cranial nerve deficit. She exhibits normal muscle tone. Coordination normal.  Skin: Skin is warm and dry. No rash noted. No erythema. No pallor.  Solar lentigos diffusely   Dermatitis diffusely on back and trunk   Psychiatric: She has a normal mood and affect.          Assessment &  Plan:   Problem List Items Addressed This Visit     Endocrine   HYPOTHYROIDISM     Sees Dr Ronnald Collum   Has had some thyroid nodule bx  Stable currently      Other   HYPERLIPIDEMIA     Pt stopped pravastatin and LDL went way up  Enc good diet and re start pravastatin at 5 mg daily (this dose got her to goal in the past)  Disc goals for lipids and reasons to control them Rev labs with pt Rev low sat fat diet in detail      Relevant Medications      pravastatin (PRAVACHOL) tablet   Overweight     Discussed how this problem influences overall health and the risks it imposes  Reviewed plan for weight loss with lower calorie diet (via better food choices and also portion control or program like weight watchers) and exercise building up to or more than 30 minutes 5 days per week including some aerobic activity       Encounter for Medicare annual wellness exam - Primary     Reviewed health habits including diet and exercise and skin cancer prevention Reviewed appropriate screening tests for age  Also reviewed health mt list, fam hx and immunization status , as well as social and family history   Labs reviewed See HPI Flu and prevnar vaccines today

## 2014-04-13 NOTE — Assessment & Plan Note (Signed)
Discussed how this problem influences overall health and the risks it imposes  Reviewed plan for weight loss with lower calorie diet (via better food choices and also portion control or program like weight watchers) and exercise building up to or more than 30 minutes 5 days per week including some aerobic activity    

## 2014-04-13 NOTE — Assessment & Plan Note (Signed)
Sees Dr Ronnald Collum  Has had some thyroid nodule bx  Stable currently

## 2014-04-13 NOTE — Assessment & Plan Note (Signed)
Reviewed health habits including diet and exercise and skin cancer prevention Reviewed appropriate screening tests for age  Also reviewed health mt list, fam hx and immunization status , as well as social and family history   Labs reviewed See HPI Flu and prevnar vaccines today

## 2014-04-13 NOTE — Patient Instructions (Addendum)
Flu shot today  prevnar shot today  Start back on the pravastatin and let me know if you have any problems  Take care of yourself   Avoid red meat/ fried foods/ egg yolks/ fatty breakfast meats/ butter, cheese and high fat dairy/ and shellfish     Fat and Cholesterol Control Diet Fat and cholesterol levels in your blood and organs are influenced by your diet. High levels of fat and cholesterol may lead to diseases of the heart, small and large blood vessels, gallbladder, liver, and pancreas. CONTROLLING FAT AND CHOLESTEROL WITH DIET Although exercise and lifestyle factors are important, your diet is key. That is because certain foods are known to raise cholesterol and others to lower it. The goal is to balance foods for their effect on cholesterol and more importantly, to replace saturated and trans fat with other types of fat, such as monounsaturated fat, polyunsaturated fat, and omega-3 fatty acids. On average, a person should consume no more than 15 to 17 g of saturated fat daily. Saturated and trans fats are considered "bad" fats, and they will raise LDL cholesterol. Saturated fats are primarily found in animal products such as meats, butter, and cream. However, that does not mean you need to give up all your favorite foods. Today, there are good tasting, low-fat, low-cholesterol substitutes for most of the things you like to eat. Choose low-fat or nonfat alternatives. Choose round or loin cuts of red meat. These types of cuts are lowest in fat and cholesterol. Chicken (without the skin), fish, veal, and ground [place] breast are great choices. Eliminate fatty meats, such as hot dogs and salami. Even shellfish have little or no saturated fat. Have a 3 oz (85 g) portion when you eat lean meat, poultry, or fish. Trans fats are also called "partially hydrogenated oils." They are oils that have been scientifically manipulated so that they are solid at room temperature resulting in a longer shelf life and  improved taste and texture of foods in which they are added. Trans fats are found in stick margarine, some tub margarines, cookies, crackers, and baked goods.  When baking and cooking, oils are a great substitute for butter. The monounsaturated oils are especially beneficial since it is believed they lower LDL and raise HDL. The oils you should avoid entirely are saturated tropical oils, such as coconut and palm.  Remember to eat a lot from food groups that are naturally free of saturated and trans fat, including fish, fruit, vegetables, beans, grains (barley, rice, couscous, bulgur wheat), and pasta (without cream sauces).  IDENTIFYING FOODS THAT LOWER FAT AND CHOLESTEROL  Soluble fiber may lower your cholesterol. This type of fiber is found in fruits such as apples, vegetables such as broccoli, potatoes, and carrots, legumes such as beans, peas, and lentils, and grains such as barley. Foods fortified with plant sterols (phytosterol) may also lower cholesterol. You should eat at least 2 g per day of these foods for a cholesterol lowering effect.  Read package labels to identify low-saturated fats, trans fat free, and low-fat foods at the supermarket. Select cheeses that have only 2 to 3 g saturated fat per ounce. Use a heart-healthy tub margarine that is free of trans fats or partially hydrogenated oil. When buying baked goods (cookies, crackers), avoid partially hydrogenated oils. Breads and muffins should be made from whole grains (whole-wheat or whole oat flour, instead of "flour" or "enriched flour"). Buy non-creamy canned soups with reduced salt and no added fats.  FOOD PREPARATION TECHNIQUES  Never deep-fry. If you must fry, either stir-fry, which uses very little fat, or use non-stick cooking sprays. When possible, broil, bake, or roast meats, and steam vegetables. Instead of putting butter or margarine on vegetables, use lemon and herbs, applesauce, and cinnamon (for squash and sweet potatoes). Use  nonfat yogurt, salsa, and low-fat dressings for salads.  LOW-SATURATED FAT / LOW-FAT FOOD SUBSTITUTES Meats / Saturated Fat (g)  Avoid: Steak, marbled (3 oz/85 g) / 11 g  Choose: Steak, lean (3 oz/85 g) / 4 g  Avoid: Hamburger (3 oz/85 g) / 7 g  Choose: Hamburger, lean (3 oz/85 g) / 5 g  Avoid: Ham (3 oz/85 g) / 6 g  Choose: Ham, lean cut (3 oz/85 g) / 2.4 g  Avoid: Chicken, with skin, dark meat (3 oz/85 g) / 4 g  Choose: Chicken, skin removed, dark meat (3 oz/85 g) / 2 g  Avoid: Chicken, with skin, light meat (3 oz/85 g) / 2.5 g  Choose: Chicken, skin removed, light meat (3 oz/85 g) / 1 g Dairy / Saturated Fat (g)  Avoid: Whole milk (1 cup) / 5 g  Choose: Low-fat milk, 2% (1 cup) / 3 g  Choose: Low-fat milk, 1% (1 cup) / 1.5 g  Choose: Skim milk (1 cup) / 0.3 g  Avoid: Hard cheese (1 oz/28 g) / 6 g  Choose: Skim milk cheese (1 oz/28 g) / 2 to 3 g  Avoid: Cottage cheese, 4% fat (1 cup) / 6.5 g  Choose: Low-fat cottage cheese, 1% fat (1 cup) / 1.5 g  Avoid: Ice cream (1 cup) / 9 g  Choose: Sherbet (1 cup) / 2.5 g  Choose: Nonfat frozen yogurt (1 cup) / 0.3 g  Choose: Frozen fruit bar / trace  Avoid: Whipped cream (1 tbs) / 3.5 g  Choose: Nondairy whipped topping (1 tbs) / 1 g Condiments / Saturated Fat (g)  Avoid: Mayonnaise (1 tbs) / 2 g  Choose: Low-fat mayonnaise (1 tbs) / 1 g  Avoid: Butter (1 tbs) / 7 g  Choose: Extra light margarine (1 tbs) / 1 g  Avoid: Coconut oil (1 tbs) / 11.8 g  Choose: Olive oil (1 tbs) / 1.8 g  Choose: Corn oil (1 tbs) / 1.7 g  Choose: Safflower oil (1 tbs) / 1.2 g  Choose: Sunflower oil (1 tbs) / 1.4 g  Choose: Soybean oil (1 tbs) / 2.4 g  Choose: Canola oil (1 tbs) / 1 g Document Released: 07/08/2005 Document Revised: 11/02/2012 Document Reviewed: 10/06/2013 Exit[employer] Patient Information 2015 [place], [place]. This information is not intended to replace advice given to you by your health care provider. Make sure  you discuss any questions you have with your health care provider.

## 2014-04-13 NOTE — Assessment & Plan Note (Signed)
Pt stopped pravastatin and LDL went way up  Enc good diet and re start pravastatin at 5 mg daily (this dose got her to goal in the past)  Disc goals for lipids and reasons to control them Rev labs with pt Rev low sat fat diet in detail

## 2014-04-13 NOTE — Assessment & Plan Note (Signed)
flonase sent to pharmacy for nasal symptoms  Disc allergen avoidance

## 2014-04-13 NOTE — Progress Notes (Signed)
Pre visit review using our clinic review tool, if applicable. No additional management support is needed unless otherwise documented below in the visit note. 

## 2014-05-23 ENCOUNTER — Encounter: Payer: Self-pay | Admitting: Family Medicine

## 2014-08-29 ENCOUNTER — Telehealth: Payer: Self-pay | Admitting: Nurse Practitioner

## 2014-08-29 NOTE — Telephone Encounter (Signed)
1. Patient called requesting to switch to "Estradiol TD patch twice weekly at 0.05" She will need a 3 month supply at a time.  2. Progesterone 100 mgs. She will need a three month supply at a time.  Per patient: Both of these will need prior authorization. The number provided by the patient is below. Patient requests a phone call back once complete.   [employer] [phone]

## 2014-08-30 NOTE — Telephone Encounter (Signed)
Morgan [name], is it okay to place an order for Vivelle dot instead of the [place] that she is currently on. Her insurance formulary covers vivelle dot. Office visit notes indicated that she was going to attempt to taper at last visit.  Also, okay to refill progesterone 100 mg tablets

## 2014-08-30 NOTE — Telephone Encounter (Signed)
Spoke with patient. [employer] ID obtained: ID [id number] Group [id number].   Called [facility]. Confirmed progesterone 100 mg tablets are on formulary, no prior authorization needed.   Spoke with patinet. Advised her request has been sent to [place] [facility], [place] and will wait for response.

## 2014-08-30 NOTE — Telephone Encounter (Signed)
[place] for change on HRT as coverage with insurance.

## 2014-08-31 MED ORDER — PROGESTERONE MICRONIZED 100 MG PO CAPS: ORAL_CAPSULE | ORAL | Status: DC

## 2014-08-31 MED ORDER — ESTRADIOL 0.05 MG/24HR TD PTTW: 1.0000 | MEDICATED_PATCH | TRANSDERMAL | Status: DC

## 2014-08-31 NOTE — Telephone Encounter (Signed)
Orders place to [place].  Will send prior authorization to  [employer]

## 2014-08-31 NOTE — Telephone Encounter (Signed)
Prior authorization faxed to  [employer] with fax confirmation received.

## 2014-09-06 NOTE — Telephone Encounter (Signed)
Spoke with patient. Advised prior authorization for Estradiol 0.05mg  patches have been approved from 09/02/2014-07/22/2015. Patient is agreeable. Approval form to [place] Cage, FNP to review and sign for scan.  Routing to provider for final review. Patient agreeable to disposition. Will close encounter

## 2015-01-25 LAB — HM MAMMOGRAPHY: HM MAMMO: NORMAL

## 2015-01-26 ENCOUNTER — Encounter: Payer: Self-pay | Admitting: Family Medicine

## 2015-01-27 ENCOUNTER — Encounter: Payer: Self-pay | Admitting: *Deleted

## 2015-01-27 ENCOUNTER — Encounter: Payer: Self-pay | Admitting: Family Medicine

## 2015-02-17 ENCOUNTER — Telehealth: Payer: Self-pay | Admitting: Family Medicine

## 2015-02-17 NOTE — Telephone Encounter (Signed)
Left voicemail requesting pt to call office back 

## 2015-02-17 NOTE — Telephone Encounter (Signed)
Patient Name: Morgan [name]  DOB: 07-19-1946    Initial Comment Caller states she has questions about hemorrhoids    Nurse Assessment  Nurse: Verlin Fester, RN, Stanton Kidney Date/Time (Eastern Time): 02/17/2015 9:42:31 AM  Confirm and document reason for call. If symptomatic, describe symptoms. ---Patient states she is using hemorrhoid  Has the patient traveled out of the country within the last 30 days? ---No  Does the patient require triage? ---Yes  Related visit to physician within the last 2 weeks? ---No  Does the PT have any chronic conditions? (i.e. diabetes, asthma, etc.) ---No     Guidelines    Guideline Title Affirmed Question Affirmed Notes  Rectal Symptoms MODERATE-SEVERE rectal pain (i.e., interferes with school, work, or sleep)    Final Disposition User   See Physician within 24 Hours Noe, RN, Stanton Kidney    Comments  After triage patient refuses appointment and states she will see how she does over the weekend and if she is not better she will make an appointment  Patient states she is using hemorrhoid ointment and asking if there is something else they recommend for this.   Disagree/Comply: Disagree

## 2015-02-17 NOTE — Telephone Encounter (Signed)
TUKs pads otc can also be helpful  Try not to strain- use a stool softener if needed

## 2015-02-17 NOTE — Telephone Encounter (Signed)
PLEASE NOTE: All timestamps contained within this report are represented as [place] Standard Time. CONFIDENTIALTY NOTICE: This fax transmission is intended only for the addressee. It contains information that is legally privileged, confidential or otherwise protected from use or disclosure. If you are not the intended recipient, you are strictly prohibited from reviewing, disclosing, copying using or disseminating any of this information or taking any action in reliance on or regarding this information. If you have received this fax in error, please notify us immediately by telephone so that we can arrange for its return to [place]. Phone: [phone], Toll-Free: [phone], Fax: [phone] Page: 1 of 2 Call Id: [id number] [place] Patient Name: Morgan [name] Gender: Female DOB: 1945/11/24 Age: 69 Y 4 M 11 D Return Phone Number: [phone] (Primary) Address: City/State/Zip: [place] Client [place] [facility] - Client Client Site [place], [place] Contact Type Call Call Type Triage / Clinical Relationship To Patient Self Appointment Disposition EMR Patient Refused Appointment Info pasted into Epic Yes Return Phone Number [phone] (Primary) Chief Complaint Rectal Symptoms Initial Comment Caller states she has questions about hemorrhoids PreDisposition Did not know what to do Nurse Assessment Nurse: Verlin Fester RN, Stanton Kidney Date/Time (Eastern Time): 02/17/2015 9:42:31 AM Confirm and document reason for call. If symptomatic, describe symptoms. ---Patient states she is using hemorrhoid Has the patient traveled out of the country within the last 30 days? ---No Does the patient require triage? ---Yes Related visit to physician within the last 2 weeks? ---No Does the PT have any chronic conditions? (i.e. diabetes, asthma,  etc.) ---No Guidelines Guideline Title Affirmed Question Affirmed Notes Nurse Date/Time Eilene Ghazi Time) Rectal Symptoms MODERATE-SEVERE rectal pain (i.e., interferes with school, work, or sleep) Verlin Fester, RN, [facility] 02/17/2015 9:43:15 AM Disp. Time Eilene Ghazi Time) Disposition Final User 02/17/2015 9:50:00 AM See Physician within 24 Hours Yes Noe, RN, Nemiah Commander Understands: Yes Disagree/Comply: Comply Care Advice Given Per Guideline SEE PHYSICIAN WITHIN 24 HOURS: WARM SALINE SITZ BATHS TWICE DAILY FOR RECTAL SYMPTOMS: * Sit in a warm sitz bath for 20 minutes twice a day. This will decrease swelling and irritation, keep the area clean, and help with PLEASE NOTE: All timestamps contained within this report are represented as [place] Standard Time. CONFIDENTIALTY NOTICE: This fax transmission is intended only for the addressee. It contains information that is legally privileged, confidential or otherwise protected from use or disclosure. If you are not the intended recipient, you are strictly prohibited from reviewing, disclosing, copying using or disseminating any of this information or taking any action in reliance on or regarding this information. If you have received this fax in error, please notify us immediately by telephone so that we can arrange for its return to [place]. Phone: [phone], Toll-Free: [phone], Fax: [phone] Page: 2 of 2 Call Id: [id number] Care Advice Given Per Guideline healing. * Afterwards, pat area dry with unscented toilet paper. HYDROCORTISONE OINTMENT FOR RECTAL ITCHING: * After SITZ bath and drying your rectal area, apply 1% hydrocortisone ointment (OTC) 2 times a day to reduce irritation. PAIN MEDICINES: * For pain relief, take acetaminophen, ibuprofen, or naproxen. * Use the lowest amount that makes your pain feel better. ACETAMINOPHEN (E.G., TYLENOL): IBUPROFEN (E.G., MOTRIN, ADVIL): NAPROXEN (E.G., ALEVE): PREVENTING CONSTIPATION: * Eat a high fiber diet. *  Drink adequate liquids. * Exercise regularly (even a daily 15 minute walk!) * Get into a rhythm - try to  have a BM at the same time each day. * Don't ignore your body's signals regarding having a BM. * Avoid enemas and stimulant laxatives. CALL BACK IF: * Severe pain * You become worse. * Fever over 100.5 F (38.1 C) CARE ADVICE given per Rectal Symptoms (Adult) guideline. After Care Instructions Given Call Event Type User Date / Time Description Comments User: Bettye Boeck, RN Date/Time Eilene Ghazi Time): 02/17/2015 9:51:56 AM After triage patient refuses appointment and states she will see how she does over the weekend and if she is not better she will make an appointment User: Bettye Boeck, RN Date/Time Eilene Ghazi Time): 02/17/2015 9:56:56 AM Patient states she is using hemorrhoid ointment and asking if there is something else they recommend for this.

## 2015-02-20 NOTE — Telephone Encounter (Signed)
Pt notified of Dr. Marliss Coots instructions. Pt said she bought the TUKs pads and some other OTC meds and it helped a lot and she is feeling better

## 2015-04-03 ENCOUNTER — Ambulatory Visit: Payer: Self-pay | Admitting: Nurse Practitioner

## 2015-04-17 ENCOUNTER — Ambulatory Visit (INDEPENDENT_AMBULATORY_CARE_PROVIDER_SITE_OTHER): Payer: PPO | Admitting: Nurse Practitioner

## 2015-04-17 ENCOUNTER — Encounter: Payer: Self-pay | Admitting: Nurse Practitioner

## 2015-04-17 VITALS — BP 126/80 | HR 52 | Ht 65.0 in | Wt 177.0 lb

## 2015-04-17 DIAGNOSIS — Z01419 Encounter for gynecological examination (general) (routine) without abnormal findings: Secondary | ICD-10-CM | POA: Diagnosis not present

## 2015-04-17 DIAGNOSIS — E042 Nontoxic multinodular goiter: Secondary | ICD-10-CM | POA: Diagnosis not present

## 2015-04-17 DIAGNOSIS — Z79890 Hormone replacement therapy (postmenopausal): Secondary | ICD-10-CM | POA: Diagnosis not present

## 2015-04-17 MED ORDER — ESTRADIOL 0.025 MG/24HR TD PTTW: 1.0000 | MEDICATED_PATCH | TRANSDERMAL | Status: DC

## 2015-04-17 MED ORDER — PROGESTERONE MICRONIZED 100 MG PO CAPS: ORAL_CAPSULE | ORAL | Status: DC

## 2015-04-17 NOTE — Progress Notes (Signed)
Patient ID: Morgan [name], female   DOB: [birth year], 69 y.o.   MRN: [id number] 69 y.o. [id number] Married  Caucasian Fe here for annual exam.  No new health diagnosis.  Fells well.  Willing to decrease HRT but will call back if not successful. Has tried to decrease HRT several times in the past without success.  Patient's last menstrual period was 07/22/1989.          Sexually active: Yes.    The current method of family planning is post menopausal status.    Exercising: No.  The patient does not participate in regular exercise at present. Smoker:  no  Health Maintenance: Pap: 10/01/2010 negative  MMG: 01/25/15, 3D, Bi-Rads 1:  Negative Colonoscopy: 9/25/ 2013 1 polyp repeat in 5 years  BMD: 2013 'normal' at PCP  TDaP: 01/28/2007 Shingles vaccine:  09/09/06 Prevnar was given: 04/13/14 Labs:  PCP maintains all labs   reports that she quit smoking about 49 years ago. Her smoking use included Cigarettes. She has never used smokeless tobacco. She reports that she does not drink alcohol or use illicit drugs.  Past Medical History  Diagnosis Date  . HLD (hyperlipidemia)   . Osteoarthritis of cervical spine   . Connective tissue disease   . DDD (degenerative disc disease), cervical   . DDD (degenerative disc disease), lumbar   . Spinal stenosis   . Obesity   . Endometrial polyp     not resolved with D&C (following)  . Bradycardia   . PONV (postoperative nausea and vomiting)   . GERD (gastroesophageal reflux disease)     gas/belching issues-was on protonix for short time-not since 6 months  . Mixed connective tissue disease diagnosed 5 years ago    no problems since diagnosis  . Allergy   . Renal calculi   . Multiple thyroid nodules 01/24/2014    Past Surgical History  Procedure Laterality Date  . Hand surgery      carpal tunnel release bilateral Dr. Daylene Katayama  . Tonsillectomy    . Appendectomy    . Dilation and curettage of uterus      fibroids  . Dilation and curettage of uterus   01/2005    endometrial polyp  . Endometrial biopsy  2008    fibroid  . Tubal ligation      1973  . Colonoscopy    . Upper gastrointestinal endoscopy    . Hysteroscopic resection       polyps of PMB    Current Outpatient Prescriptions  Medication Sig Dispense Refill  . Ascorbic Acid (VITAMIN C) 1000 MG tablet Take 1,000 mg by mouth at bedtime.     . Cholecalciferol (VITAMIN D-3 PO) Take 1,000 Units by mouth at bedtime.     . Clobetasol Propionate Emulsion 0.05 % topical foam Apply 1 application topically 2 (two) times a week.     . fluticasone (FLONASE) 50 MCG/ACT nasal spray Place 2 sprays into both nostrils daily. 16 g 11  . ibuprofen (ADVIL,MOTRIN) 200 MG tablet Take 400 mg by mouth as needed.    . Lactobacillus Rhamnosus, GG, (CULTURELLE PO) Take 1 tablet by mouth daily. Culturelle    . levothyroxine (SYNTHROID, LEVOTHROID) 50 MCG tablet Take 50 mcg 3 times a week (Mon, Wed, Fri),25 mcg 4 times a week (Tues, [place], Sat, Sun)    . Multiple Vitamins-Minerals (HAIR/SKIN/NAILS PO) Take 1 tablet by mouth daily.     . polyethylene glycol (MIRALAX / GLYCOLAX) packet You may use this as needed  for more acute constipation.  Follow label instructions. 14 each 0  . progesterone (PROMETRIUM) 100 MG capsule 1 tablet po daily 90 capsule 4  . valACYclovir (VALTREX) 1000 MG tablet Take 1 tablet (1,000 mg total) by mouth as needed (for fever blisters). 30 tablet 5  . estradiol (VIVELLE-DOT) 0.025 MG/24HR Place 1 patch onto the skin 2 (two) times a week. 24 patch 4   No current facility-administered medications for this visit.    Family History  Problem Relation Age of Onset  . Coronary artery disease Father   . Heart attack Father     deceased at 75  . Diabetes Maternal Grandmother   . Goiter Maternal Grandmother   . Diabetes Cousin   . Cancer Grandchild     rare type (dying at age 33)  . Colon cancer Neg Hx   . Esophageal cancer Neg Hx   . Stomach cancer Neg Hx   . Rectal cancer Neg Hx      ROS:  Pertinent items are noted in HPI.  Otherwise, a comprehensive ROS was negative.  Exam:   BP 126/80 mmHg  Pulse 52  Ht 5\' 5"  (1.651 m)  Wt 177 lb (80.287 kg)  BMI 29.45 kg/m2  LMP 07/22/1989 Height: 5\' 5"  (165.1 cm) Ht Readings from Last 3 Encounters:  04/17/15 5\' 5"  (1.651 m)  04/13/14 5' 7.75" (1.721 m)  02/14/14 5\' 5"  (1.651 m)    General appearance: alert, cooperative and appears stated age Head: Normocephalic, without obvious abnormality, atraumatic Neck: no adenopathy, supple, symmetrical, trachea midline and thyroid normal to inspection and palpation Lungs: clear to auscultation bilaterally Breasts: normal appearance, no masses or tenderness Heart: regular rate and rhythm Abdomen: soft, non-tender; no masses,  no organomegaly Extremities: extremities normal, atraumatic, no cyanosis or edema Skin: Skin color, texture, turgor normal. No rashes or lesions Lymph nodes: Cervical, supraclavicular, and axillary nodes normal. No abnormal inguinal nodes palpated Neurologic: Grossly normal   Pelvic: External genitalia:  no lesions              Urethra:  normal appearing urethra with no masses, tenderness or lesions              Bartholin's and Skene's: normal                 Vagina: normal appearing vagina with normal color and discharge, no lesions              Cervix: anteverted              Pap taken: No. Bimanual Exam:  Uterus:  normal size, contour, position, consistency, mobility, non-tender              Adnexa: no mass, fullness, tenderness               Rectovaginal: Confirms               Anus:  normal sphincter tone, no lesions  Chaperone present: no  A:  Well Woman with normal exam  Postmenopausal on HRT S/P endo biopsy 01/18/14 for PMB and was negative for hyperplasia  History of DDD, hyperlipidemia, renal calculi   P:   Reviewed health and wellness pertinent to exam  Pap smear as above  Mammogram is due 01/2016  Refill HRT -  will try to decrease dosage initially will try every other patch at higher dose of Vivelle dot 0.05 mg and then 0.025 mg.  Then taper down to the lower dose after  a month.  If not tolerated to call back.  Counseled with risk of DVT, CVA, cancer, etc.  Counseled on breast self exam, mammography screening, use and side effects of HRT, adequate intake of calcium and vitamin D, diet and exercise return annually or prn  An After Visit Summary was printed and given to the patient.

## 2015-04-17 NOTE — Patient Instructions (Signed)

## 2015-04-20 NOTE — Progress Notes (Signed)
Encounter reviewed by Dr. Brook Amundson C. Silva.  

## 2015-04-28 ENCOUNTER — Other Ambulatory Visit: Payer: Self-pay | Admitting: Nurse Practitioner

## 2015-04-28 NOTE — Telephone Encounter (Signed)
Medication refill request: Valtrex  Last AEX:  04/17/15 PG Next AEX: None Last MMG (if hormonal medication request): 01/25/15 BIRADS1:neg Refill authorized: 02/14/14 #30tabs/ 5 Refills. Today please advise.

## 2015-07-06 ENCOUNTER — Ambulatory Visit (INDEPENDENT_AMBULATORY_CARE_PROVIDER_SITE_OTHER): Payer: PPO | Admitting: Internal Medicine

## 2015-07-06 ENCOUNTER — Encounter: Payer: Self-pay | Admitting: Internal Medicine

## 2015-07-06 VITALS — BP 134/82 | HR 55 | Temp 98.0°F | Wt 182.5 lb

## 2015-07-06 DIAGNOSIS — R05 Cough: Secondary | ICD-10-CM

## 2015-07-06 DIAGNOSIS — R0982 Postnasal drip: Secondary | ICD-10-CM

## 2015-07-06 DIAGNOSIS — R059 Cough, unspecified: Secondary | ICD-10-CM

## 2015-07-06 MED ORDER — AZITHROMYCIN 250 MG PO TABS: ORAL_TABLET | ORAL | Status: DC

## 2015-07-06 NOTE — Patient Instructions (Signed)

## 2015-07-06 NOTE — Progress Notes (Signed)
HPI  Pt presents to the clinic today with c/o sore throat and cough. This started 3-4 days ago. She denies difficulty swallowing.  The cough is non productive, and seems worse at night. She denies runny nose, nasal congestion or shortness of breath. She denies fever, chills or body aches She has tried Ibuprofen and Delsym with minimal relief. She does have a history of seasonal allergies. She has no breathing problems. She has had sick contacts.  Review of Systems      Past Medical History  Diagnosis Date  . HLD (hyperlipidemia)   . Osteoarthritis of cervical spine   . Connective tissue disease ([place])   . DDD (degenerative disc disease), cervical   . DDD (degenerative disc disease), lumbar   . Spinal stenosis   . Obesity   . Endometrial polyp     not resolved with D&C (following)  . Bradycardia   . PONV (postoperative nausea and vomiting)   . GERD (gastroesophageal reflux disease)     gas/belching issues-was on protonix for short time-not since 6 months  . Mixed connective tissue disease ([place]) diagnosed 5 years ago    no problems since diagnosis  . Allergy   . Renal calculi   . Multiple thyroid nodules 01/24/2014    Family History  Problem Relation Age of Onset  . Coronary artery disease Father   . Heart attack Father     deceased at 59  . Diabetes Maternal Grandmother   . Goiter Maternal Grandmother   . Diabetes Cousin   . Cancer Grandchild     rare type (dying at age 72)  . Colon cancer Neg Hx   . Esophageal cancer Neg Hx   . Stomach cancer Neg Hx   . Rectal cancer Neg Hx     Social History   Social History  . Marital Status: Married    Spouse Name: N/A  . Number of Children: N/A  . Years of Education: N/A   Occupational History  . Not on file.   Social History Main Topics  . Smoking status: Former Smoker    Types: Cigarettes    Quit date: 07/22/1965  . Smokeless tobacco: Never Used  . Alcohol Use: No  . Drug Use: No  . Sexual Activity: Yes    Birth  Control/ Protection: Surgical     Comment: BTL   Other Topics Concern  . Not on file   Social History Narrative    Allergies  Allergen Reactions  . Codeine Nausea And Vomiting  . Hydrocodone-Acetaminophen     REACTION: reaction not known  . Penicillins Hives     Constitutional: Denies headache, fatigue, fever or abrupt weight changes.  HEENT:  Positive sore throat. Denies eye redness, eye pain, pressure behind the eyes, facial pain, nasal congestion, ear pain, ringing in the ears, wax buildup, runny nose or bloody nose. Respiratory: Positive cough. Denies difficulty breathing or shortness of breath.  Cardiovascular: Denies chest pain, chest tightness, palpitations or swelling in the hands or feet.   No other specific complaints in a complete review of systems (except as listed in HPI above).  Objective:   BP 134/82 mmHg  Pulse 55  Temp(Src) 98 F (36.7 C) (Oral)  Wt 182 lb 8 oz (82.781 kg)  SpO2 98%  LMP 07/22/1989   Wt Readings from Last 3 Encounters:  07/06/15 182 lb 8 oz (82.781 kg)  04/17/15 177 lb (80.287 kg)  04/13/14 181 lb 4 oz (82.214 kg)     General:  Appears her stated age, well developed, well nourished in NAD. HEENT: Head: normal shape and size, no sinus tenderness noted; Eyes: sclera white, no icterus, conjunctiva pink; Ears: Tm's gray and intact, normal light reflex; Nose: mucosa pink and moist, septum midline; Throat/Mouth: + PND. Teeth present, mucosa erythematous and moist, no exudate noted, no lesions or ulcerations noted.  Neck: No cervical lymphadenopathy.  Cardiovascular: Normal rate and rhythm. S1,S2 noted.  No murmur, rubs or gallops noted.  Pulmonary/Chest: Normal effort and positive vesicular breath sounds. No respiratory distress. No wheezes, rales or ronchi noted.      Assessment & Plan:   Cough secondary to PND:  Get some rest and drink plenty of water Do salt water gargles for the sore throat Start Allegra daily at night Printed Rx  for Azithromax x 5 days in case this gets worse over the weekend Delsym as needed for cough  RTC as needed or if symptoms persist.

## 2015-07-06 NOTE — Progress Notes (Signed)
Pre visit review using our clinic review tool, if applicable. No additional management support is needed unless otherwise documented below in the visit note. 

## 2015-08-18 DIAGNOSIS — Z85828 Personal history of other malignant neoplasm of skin: Secondary | ICD-10-CM | POA: Diagnosis not present

## 2015-08-18 DIAGNOSIS — L259 Unspecified contact dermatitis, unspecified cause: Secondary | ICD-10-CM | POA: Diagnosis not present

## 2015-08-18 DIAGNOSIS — E559 Vitamin D deficiency, unspecified: Secondary | ICD-10-CM | POA: Diagnosis not present

## 2015-08-18 DIAGNOSIS — D1801 Hemangioma of skin and subcutaneous tissue: Secondary | ICD-10-CM | POA: Diagnosis not present

## 2015-08-18 DIAGNOSIS — L280 Lichen simplex chronicus: Secondary | ICD-10-CM | POA: Diagnosis not present

## 2015-08-18 DIAGNOSIS — Z79899 Other long term (current) drug therapy: Secondary | ICD-10-CM | POA: Diagnosis not present

## 2015-08-18 DIAGNOSIS — L309 Dermatitis, unspecified: Secondary | ICD-10-CM | POA: Diagnosis not present

## 2015-08-30 DIAGNOSIS — Z79899 Other long term (current) drug therapy: Secondary | ICD-10-CM | POA: Diagnosis not present

## 2015-08-30 DIAGNOSIS — M2550 Pain in unspecified joint: Secondary | ICD-10-CM | POA: Diagnosis not present

## 2015-08-30 DIAGNOSIS — M19241 Secondary osteoarthritis, right hand: Secondary | ICD-10-CM | POA: Diagnosis not present

## 2015-08-30 DIAGNOSIS — M5137 Other intervertebral disc degeneration, lumbosacral region: Secondary | ICD-10-CM | POA: Diagnosis not present

## 2015-08-30 DIAGNOSIS — R21 Rash and other nonspecific skin eruption: Secondary | ICD-10-CM | POA: Diagnosis not present

## 2015-08-30 DIAGNOSIS — R300 Dysuria: Secondary | ICD-10-CM | POA: Diagnosis not present

## 2015-08-30 DIAGNOSIS — M19041 Primary osteoarthritis, right hand: Secondary | ICD-10-CM | POA: Diagnosis not present

## 2015-09-12 ENCOUNTER — Encounter: Payer: Self-pay | Admitting: Family Medicine

## 2015-09-17 ENCOUNTER — Telehealth: Payer: Self-pay | Admitting: Family Medicine

## 2015-09-17 DIAGNOSIS — E039 Hypothyroidism, unspecified: Secondary | ICD-10-CM

## 2015-09-17 DIAGNOSIS — E785 Hyperlipidemia, unspecified: Secondary | ICD-10-CM

## 2015-09-17 DIAGNOSIS — Z0000 Encounter for general adult medical examination without abnormal findings: Secondary | ICD-10-CM

## 2015-09-17 NOTE — Telephone Encounter (Signed)
-----   Message from Marchia Bond sent at 09/14/2015  9:59 AM EST ----- Regarding: Cpx labs Wed 3/1, need orders. Thanks! :-) Please order  future cpx labs for pt's upcoming lab appt. Thanks Aniceto Boss

## 2015-09-20 ENCOUNTER — Other Ambulatory Visit (INDEPENDENT_AMBULATORY_CARE_PROVIDER_SITE_OTHER): Payer: PPO

## 2015-09-20 DIAGNOSIS — E039 Hypothyroidism, unspecified: Secondary | ICD-10-CM | POA: Diagnosis not present

## 2015-09-20 DIAGNOSIS — Z0000 Encounter for general adult medical examination without abnormal findings: Secondary | ICD-10-CM | POA: Diagnosis not present

## 2015-09-20 DIAGNOSIS — E785 Hyperlipidemia, unspecified: Secondary | ICD-10-CM

## 2015-09-20 LAB — CBC WITH DIFFERENTIAL/PLATELET
BASOS ABS: 0 10*3/uL (ref 0.0–0.1)
Basophils Relative: 0.4 % (ref 0.0–3.0)
Eosinophils Absolute: 0.1 10*3/uL (ref 0.0–0.7)
Eosinophils Relative: 3 % (ref 0.0–5.0)
HEMATOCRIT: 37.5 % (ref 36.0–46.0)
HEMOGLOBIN: 12.7 g/dL (ref 12.0–15.0)
LYMPHS PCT: 33.1 % (ref 12.0–46.0)
Lymphs Abs: 1.3 10*3/uL (ref 0.7–4.0)
MCHC: 33.8 g/dL (ref 30.0–36.0)
MCV: 90.6 fl (ref 78.0–100.0)
MONOS PCT: 15.1 % — AB (ref 3.0–12.0)
Monocytes Absolute: 0.6 10*3/uL (ref 0.1–1.0)
NEUTROS ABS: 1.9 10*3/uL (ref 1.4–7.7)
Neutrophils Relative %: 48.4 % (ref 43.0–77.0)
PLATELETS: 249 10*3/uL (ref 150.0–400.0)
RBC: 4.14 Mil/uL (ref 3.87–5.11)
RDW: 13.8 % (ref 11.5–15.5)
WBC: 4 10*3/uL (ref 4.0–10.5)

## 2015-09-20 LAB — LIPID PANEL
CHOLESTEROL: 198 mg/dL (ref 0–200)
HDL: 46.3 mg/dL (ref >39.00)
LDL Cholesterol: 134 mg/dL — ABNORMAL HIGH (ref 0–99)
NonHDL: 151.62
TRIGLYCERIDES: 86 mg/dL (ref 0.0–149.0)
Total CHOL/HDL Ratio: 4
VLDL: 17.2 mg/dL (ref 0.0–40.0)

## 2015-09-20 LAB — COMPREHENSIVE METABOLIC PANEL
ALBUMIN: 4.1 g/dL (ref 3.5–5.2)
ALK PHOS: 61 U/L (ref 39–117)
ALT: 16 U/L (ref 0–35)
AST: 18 U/L (ref 0–37)
BUN: 16 mg/dL (ref 6–23)
CALCIUM: 9.2 mg/dL (ref 8.4–10.5)
CO2: 27 meq/L (ref 19–32)
CREATININE: 0.75 mg/dL (ref 0.40–1.20)
Chloride: 108 mEq/L (ref 96–112)
GFR: 81.41 mL/min (ref >60.00)
Glucose, Bld: 82 mg/dL (ref 70–99)
Potassium: 4.1 mEq/L (ref 3.5–5.1)
Sodium: 140 mEq/L (ref 135–145)
TOTAL PROTEIN: 6.2 g/dL (ref 6.0–8.3)
Total Bilirubin: 0.4 mg/dL (ref 0.2–1.2)

## 2015-09-20 LAB — TSH: TSH: 0.6 u[IU]/mL (ref 0.35–4.50)

## 2015-09-22 ENCOUNTER — Ambulatory Visit (INDEPENDENT_AMBULATORY_CARE_PROVIDER_SITE_OTHER): Payer: PPO

## 2015-09-22 ENCOUNTER — Ambulatory Visit: Payer: PPO

## 2015-09-22 VITALS — BP 124/82 | HR 52 | Temp 98.2°F | Ht 65.0 in | Wt 182.8 lb

## 2015-09-22 DIAGNOSIS — Z1159 Encounter for screening for other viral diseases: Secondary | ICD-10-CM

## 2015-09-22 DIAGNOSIS — Z0000 Encounter for general adult medical examination without abnormal findings: Secondary | ICD-10-CM

## 2015-09-22 DIAGNOSIS — Z23 Encounter for immunization: Secondary | ICD-10-CM

## 2015-09-22 NOTE — Progress Notes (Signed)
Subjective:   Morgan [name] is a 70 y.o. female who presents for Medicare Annual (Subsequent) preventive examination.   Cardiac Risk Factors include: advanced age (>52men, >46 women);sedentary lifestyle     Objective:     Vitals: BP 124/82 mmHg  Pulse 52  Temp(Src) 98.2 F (36.8 C) (Oral)  Ht 5\' 5"  (1.651 m)  Wt 182 lb 12 oz (82.895 kg)  BMI 30.41 kg/m2  SpO2 95%  LMP 07/22/1989  Tobacco History  Smoking status  . Former Smoker  . Types: Cigarettes  . Quit date: 07/22/1965  Smokeless tobacco  . Never Used     Counseling given: No   Past Medical History  Diagnosis Date  . HLD (hyperlipidemia)   . Osteoarthritis of cervical spine   . Connective tissue disease ([place])   . DDD (degenerative disc disease), cervical   . DDD (degenerative disc disease), lumbar   . Spinal stenosis   . Obesity   . Endometrial polyp     not resolved with D&C (following)  . Bradycardia   . PONV (postoperative nausea and vomiting)   . GERD (gastroesophageal reflux disease)     gas/belching issues-was on protonix for short time-not since 6 months  . Mixed connective tissue disease ([place]) diagnosed 5 years ago    no problems since diagnosis  . Allergy   . Renal calculi   . Multiple thyroid nodules 01/24/2014   Past Surgical History  Procedure Laterality Date  . Hand surgery      carpal tunnel release bilateral Dr. Daylene Katayama  . Tonsillectomy    . Appendectomy    . Dilation and curettage of uterus      fibroids  . Dilation and curettage of uterus  01/2005    endometrial polyp  . Endometrial biopsy  2008    fibroid  . Tubal ligation      1973  . Colonoscopy    . Upper gastrointestinal endoscopy    . Hysteroscopic resection       polyps of PMB   Family History  Problem Relation Age of Onset  . Coronary artery disease Father   . Heart attack Father     deceased at 5  . Diabetes Maternal Grandmother   . Goiter Maternal Grandmother   . Diabetes Cousin   . Cancer Grandchild     rare type (dying at age 61)  . Colon cancer Neg Hx   . Esophageal cancer Neg Hx   . Stomach cancer Neg Hx   . Rectal cancer Neg Hx    History  Sexual Activity  . Sexual Activity: Yes  . Birth Control/ Protection: Surgical    Comment: BTL    Outpatient Encounter Prescriptions as of 09/22/2015  Medication Sig  . Cholecalciferol (VITAMIN D-3 PO) Take 1,000 Units by mouth 2 (two) times daily.   . Clobetasol Propionate Emulsion 0.05 % topical foam Apply 1 application topically 2 (two) times a week.   . estradiol (VIVELLE-DOT) 0.025 MG/24HR Place 1 patch onto the skin 2 (two) times a week.  . fluticasone (FLONASE) 50 MCG/ACT nasal spray Place 2 sprays into both nostrils daily. (Patient taking differently: Place 2 sprays into both nostrils daily. )  . ibuprofen (ADVIL,MOTRIN) 200 MG tablet Take 400 mg by mouth as needed.  . Lactobacillus Rhamnosus, GG, (CULTURELLE PO) Take 1 tablet by mouth daily. Culturelle  . levothyroxine (SYNTHROID, LEVOTHROID) 50 MCG tablet Take 50 mcg 3 times a week (Mon, Wed, Fri),25 mcg 4 times a week (Tues,  Thurs, Sat, Sun)  . Multiple Vitamins-Minerals (HAIR/SKIN/NAILS PO) Take 1 tablet by mouth daily.   . polyethylene glycol (MIRALAX / GLYCOLAX) packet You may use this as needed for more acute constipation.  Follow label instructions.  . progesterone (PROMETRIUM) 100 MG capsule 1 tablet po daily  . valACYclovir (VALTREX) 1000 MG tablet TAKE ONE TABLET BY MOUTH ONCE DAILY AS NEEDED FOR FEVER BLISTERS  . Ascorbic Acid (VITAMIN C) 1000 MG tablet Take 1,000 mg by mouth at bedtime. Reported on 09/22/2015  . azithromycin (ZITHROMAX) 250 MG tablet Take 2 tabs today, then 1 tab daily x 4 days (Patient not taking: Reported on 09/22/2015)   No facility-administered encounter medications on file as of 09/22/2015.    Activities of Daily Living In your present state of health, do you have any difficulty performing the following activities: 09/22/2015  Hearing? N  Vision? N    Difficulty concentrating or making decisions? N  Walking or climbing stairs? N  Dressing or bathing? N  Doing errands, shopping? N  Preparing Food and eating ? N  Using the Toilet? N  In the past six months, have you accidently leaked urine? N  Do you have problems with loss of bowel control? N  Managing your Medications? N  Managing your Finances? N  Housekeeping or managing your Housekeeping? N    Patient Care Team: Abner Greenspan, MD as PCP - General Rolm Bookbinder, MD as Consulting Physician (Dermatology) Marybelle Killings, MD as Consulting Physician (Orthopedic Surgery) Kem Boroughs, FNP as Nurse Practitioner (Family Medicine) Lenard Simmer, MD as Attending Physician (Endocrinology) Bo Merino, MD as Consulting Physician (Rheumatology)  Webb Laws, MD as Consulting Physician (Opthalmology)  Assessment:    Exercise Activities and Dietary recommendations Current Exercise Habits:: The patient does not participate in regular exercise at present  Goals    . Reduce sugar intake to X grams per day     Starting 09/25/15, I will eat 2 svgs of sweet foods per week.       Fall Risk Fall Risk  09/22/2015 04/13/2014  Falls in the past year? No No   Depression Screen PHQ 2/9 Scores 09/22/2015 04/13/2014  PHQ - 2 Score 0 0     Cognitive Testing MMSE - Mini Mental State Exam 09/22/2015  Orientation to time 5  Orientation to Place 5  Registration 3  Attention/ Calculation 5  Recall 3  Language- name 2 objects 0  Language- repeat 1  Language- follow 3 step command 3  Language- read & follow direction 1  Write a sentence 0  Copy design 0  Total score 26    Immunization History  Administered Date(s) Administered  . Influenza,inj,Quad PF,36+ Mos 04/13/2014, 09/22/2015  . Pneumococcal Conjugate-13 04/13/2014  . Pneumococcal Polysaccharide-23 09/15/2012  . Td 11/07/1997, 01/28/2007  . Zoster 09/09/2006   Screening Tests Health Maintenance  Topic Date Due  .  MAMMOGRAM  01/25/2016  . INFLUENZA VACCINE  02/20/2016  . TETANUS/TDAP  01/27/2017  . COLONOSCOPY  04/15/2017  . DEXA SCAN  Completed  . ZOSTAVAX  Completed  . Hepatitis C Screening  Completed  . PNA vac Low Risk Adult  Completed      Plan:    I have personally reviewed the Medicare Annual Wellness questionnaire and have noted the following in the patient's chart:  A. Medical and social history B. Use of alcohol, tobacco or illicit drugs  C. Current medications and supplements D. Functional ability and status E.  Nutritional status F.  Physical activity G. Advance directives H. List of other physicians I.  Hospitalizations, surgeries, and ER visits in previous 12 months J.  [place] to include hearing, vision, cognitive, depression L. Referrals, if needed  In addition, I reviewed preventive protocols, quality metrics, and best practice recommendations specific to patient. A written personalized care plan for preventive services as well as general preventive health recommendations was provided to patient.  See attached scanned questionnaire for additional information.   Signed,   Lindell Noe, MHA, BS, LPN Health Advisor [id number]

## 2015-09-22 NOTE — Progress Notes (Signed)
Pre visit review using our clinic review tool, if applicable. No additional management support is needed unless otherwise documented below in the visit note. 

## 2015-09-22 NOTE — Patient Instructions (Signed)
Morgan [name] , Thank you for taking time to come for your Medicare Wellness Visit. I appreciate your ongoing commitment to your health goals. Please review the following plan we discussed and let me know if I can assist you in the future.   These are the goals we discussed: Goals    . Reduce sugar intake to X grams per day     Starting 09/25/15, I will eat 2 svgs of sweet foods per week.        This is a list of the screening recommended for you and due dates:  Health Maintenance  Topic Date Due  .  Hepatitis C: One time screening is recommended by Center for Disease Control  (CDC) for  adults born from [birth year] through 1964.   completed  . Flu Shot  02/19/2017  . Mammogram  01/25/2016  . Tetanus Vaccine  01/27/2017  . Colon Cancer Screening  04/15/2017  . DEXA scan (bone density measurement)  Completed  . Shingles Vaccine  Completed  . Pneumonia vaccines  Completed   Preventive Care for Women  A healthy lifestyle and preventive care can promote health and wellness. Preventive health guidelines for women include the following key practices.  . A routine yearly physical is a good way to check with your health care provider about your health and preventive screening. It is a chance to share any concerns and updates on your health and to receive a thorough exam.  . Visit your dentist for a routine exam and preventive care every 6 months. Brush your teeth twice a day and floss once a day. Good oral hygiene prevents tooth decay and gum disease.  . The frequency of eye exams is based on your age, health, family medical history, use  of contact lenses, and other factors. Follow your health care provider's ecommendations for frequency of eye exams.  . Eat a healthy diet. Foods like vegetables, fruits, whole grains, low-fat dairy products, and lean protein foods contain the nutrients you need without too many calories. Decrease your intake of foods high in solid fats, added sugars, and salt. Eat the  right amount of calories for you. Get information about a proper diet from your health care provider, if necessary.  . Regular physical exercise is one of the most important things you can do for your health. Most adults should get at least 150 minutes of moderate-intensity exercise (any activity that increases your heart rate and causes you to sweat) each week. In addition, most adults need muscle-strengthening exercises on 2 or more days a week.  . Maintain a healthy weight. The body mass index (BMI) is a screening tool to identify possible weight problems. It provides an estimate of body fat based on height and weight. Your health care provider can find your BMI and can help you achieve or maintain a healthy weight.   For adults 20 years and older: ? A BMI below 18.5 is considered underweight. ? A BMI of 18.5 to 24.9 is normal. ? A BMI of 25 to 29.9 is considered overweight. ? A BMI of 30 and above is considered obese.   . Maintain normal blood lipids and cholesterol levels by exercising and minimizing your intake of saturated fat. Eat a balanced diet with plenty of fruit and vegetables. Blood tests for lipids and cholesterol should begin at age 73 and be repeated every 5 years. If your lipid or cholesterol levels are high, you are over 50, or you are at high risk for  heart disease, you may need your cholesterol levels checked more frequently. Ongoing high lipid and cholesterol levels should be treated with medicines if diet and exercise are not working.  . If you smoke, find out from your health care provider how to quit. If you do not use tobacco, please do not start.  . If you choose to drink alcohol, please do not consume more than 2 drinks per day. One drink is considered to be 12 ounces (355 mL) of beer, 5 ounces (148 mL) of wine, or 1.5 ounces (44 mL) of liquor.  . If you are 56-45 years old, ask your health care provider if you should take aspirin to prevent strokes.  . Osteoporosis  is a disease in which the bones lose minerals and strength with aging. This can result in serious bone fractures or breaks. The risk of osteoporosis can be identified using a bone density scan. Women ages 85 years and over and women at risk for fractures or osteoporosis should discuss screening with their health care providers. Ask your health care provider whether you should take a calcium supplement or vitamin D to reduce the rate of osteoporosis.  . Menopause can be associated with physical symptoms and risks. Hormone replacement therapy is available to decrease symptoms and risks. You should talk to your health care provider about whether hormone replacement therapy is right for you.  . Use sunscreen. Apply sunscreen liberally and repeatedly throughout the day. You should seek shade when your shadow is shorter than you. Protect yourself by wearing long sleeves, pants, a wide-brimmed hat, and sunglasses year round, whenever you are outdoors.  . Once a month, do a whole body skin exam, using a mirror to look at the skin on your back. Tell your health care provider of new moles, moles that have irregular borders, moles that are larger than a pencil eraser, or moles that have changed in shape or color.

## 2015-09-23 LAB — HEPATITIS C ANTIBODY: HCV AB: NEGATIVE

## 2015-09-24 NOTE — Progress Notes (Signed)
   Subjective:    Patient ID: Morgan [name], female    DOB: October 04, 1945, 69 y.o.   MRN: [id number]  HPI    Review of Systems     Objective:   Physical Exam        Assessment & Plan:  I reviewed health advisor's note, was available for consultation, and agree with documentation and plan.

## 2015-09-25 ENCOUNTER — Ambulatory Visit (INDEPENDENT_AMBULATORY_CARE_PROVIDER_SITE_OTHER): Payer: PPO | Admitting: Family Medicine

## 2015-09-25 ENCOUNTER — Encounter: Payer: Self-pay | Admitting: Family Medicine

## 2015-09-25 VITALS — BP 126/86 | HR 56 | Temp 97.7°F | Ht 65.0 in | Wt 181.2 lb

## 2015-09-25 DIAGNOSIS — Z0000 Encounter for general adult medical examination without abnormal findings: Secondary | ICD-10-CM | POA: Diagnosis not present

## 2015-09-25 DIAGNOSIS — E785 Hyperlipidemia, unspecified: Secondary | ICD-10-CM

## 2015-09-25 DIAGNOSIS — E039 Hypothyroidism, unspecified: Secondary | ICD-10-CM

## 2015-09-25 NOTE — Assessment & Plan Note (Signed)
Reviewed health habits including diet and exercise and skin cancer prevention Reviewed appropriate screening tests for age  Also reviewed health mt list, fam hx and immunization status , as well as social and family history   See HPI Labs reviewed amw visit reviewed Think about starting an exercise program- it would help your health in many ways and allow you to stay independent longer Continue your vitamin D for bone health See your gyn doctor this summer -discuss your long term plan for hormones  For cholesterol   Avoid red meat/ fried foods/ egg yolks/ fatty breakfast meats/ butter, cheese and high fat dairy/ and shellfish    See the handout for cholesterol

## 2015-09-25 NOTE — Progress Notes (Signed)
Pre visit review using our clinic review tool, if applicable. No additional management support is needed unless otherwise documented below in the visit note. 

## 2015-09-25 NOTE — Assessment & Plan Note (Signed)
Continues to see Dr Ronnald Collum for thyroid nodules and hypothyroidism Hypothyroidism  Pt has no clinical changes No change in energy level/ hair or skin/ edema and no tremor Lab Results  Component Value Date   TSH 0.60 09/20/2015

## 2015-09-25 NOTE — Assessment & Plan Note (Signed)
Disc goals for lipids and reasons to control them Rev labs with pt Rev low sat fat diet in detail Cannot tolerate statins  Diet can still improve Rev diet and handout given

## 2015-09-25 NOTE — Patient Instructions (Signed)
Think about starting an exercise program- it would help your health in many ways and allow you to stay independent longer Continue your vitamin D for bone health See your gyn doctor this summer -discuss your long term plan for hormones  For cholesterol   Avoid red meat/ fried foods/ egg yolks/ fatty breakfast meats/ butter, cheese and high fat dairy/ and shellfish    See the handout for cholesterol

## 2015-09-25 NOTE — Progress Notes (Signed)
Subjective:    Patient ID: Morgan [name], female    DOB: [birth year], 70 y.o.   MRN: [id number]  HPI Here for health maintenance exam and to review chronic medical problems    Is feeling good   Wt is down 1 lb with bmi of 30 Thinks she could limit her sweet intake to once per week    BP Readings from Last 3 Encounters:  09/25/15 126/86  09/22/15 124/82  07/06/15 134/82    utd imms Rev amw with Lesia   dexa 3/14  Nl range  She takes vit D3 for her bones  Does not take ca due to kidney stones   Hep C screen negative   Lab Results  Component Value Date   WBC 4.0 09/20/2015   HGB 12.7 09/20/2015   HCT 37.5 09/20/2015   MCV 90.6 09/20/2015   PLT 249.0 09/20/2015      Chemistry      Component Value Date/Time   NA 140 09/20/2015 1047   K 4.1 09/20/2015 1047   CL 108 09/20/2015 1047   CO2 27 09/20/2015 1047   BUN 16 09/20/2015 1047   CREATININE 0.75 09/20/2015 1047      Component Value Date/Time   CALCIUM 9.2 09/20/2015 1047   ALKPHOS 61 09/20/2015 1047   AST 18 09/20/2015 1047   ALT 16 09/20/2015 1047   BILITOT 0.4 09/20/2015 1047     82 glucose- good!   Hypothyroidism  Pt has no clinical changes No change in energy level/ hair or skin/ edema and no tremor Lab Results  Component Value Date   TSH 0.60 09/20/2015    She sees Dr Francoise Schaumann - and had 2 more nodules last year that he is watching  Overall stable  She has not noticed any differences in her neck   She states she had a good report from rheum -no lupus Seeing derm - she has had some rash/skin issues - she has stopped some vitamins - ? If allergic to some things she was taking (vitamin)   Cholesterol Lab Results  Component Value Date   CHOL 198 09/20/2015   CHOL 217* 04/08/2014   CHOL 158 09/10/2012   Lab Results  Component Value Date   HDL 46.30 09/20/2015   HDL 45.00 04/08/2014   HDL 49.30 09/10/2012   Lab Results  Component Value Date   LDLCALC 134* 09/20/2015   LDLCALC 157*  04/08/2014   LDLCALC 92 09/10/2012   Lab Results  Component Value Date   TRIG 86.0 09/20/2015   TRIG 75.0 04/08/2014   TRIG 86.0 09/10/2012   Lab Results  Component Value Date   CHOLHDL 4 09/20/2015   CHOLHDL 5 04/08/2014   CHOLHDL 3 09/10/2012   Lab Results  Component Value Date   LDLDIRECT 148.8 10/09/2010   LDLDIRECT 166.4 08/09/2010   LDLDIRECT 144.5 01/29/2008  not at goal but improved (LDL is down from 150s to 130s) HDL is stable   Does not exercise regularly  She eats healthy in general - high fiber  Does not watch for cholesterol particularly  occ sweets   Beef-not often  No fried food - stopped hush puppies , except occ fried fish -once every 2 weeks  occ bacon -once per week  occ eggs  Avoids high fat dairy except cheese       Sees GYN - Dr Helene Shoe On low dose HRT - combination  Thinks her last exam was in July  No hx of abn  pap No new partners   Patient Active Problem List   Diagnosis Date Noted  . Encounter for Medicare annual wellness exam 04/13/2014  . Allergic rhinitis 04/13/2014  . Routine general medical examination at a health care facility 04/07/2014  . Abdominal distension 01/28/2014  . Multiple thyroid nodules 01/24/2014  . SBO (small bowel obstruction) ([place]) 01/20/2014  . Low back strain 01/07/2014  . DDD (degenerative disc disease), cervical   . DDD (degenerative disc disease), lumbar   . Spinal stenosis   . Overweight(278.02)   . TINNITUS 06/08/2010  . NONSPECIFIC ABNORMAL TOXICOLOGICAL FINDINGS 05/11/2010  . Hypothyroidism 04/18/2010  . BRADYCARDIA 05/31/2008  . GERD 05/03/2008  . DYSPEPSIA 05/03/2008  . DIVERTICULOSIS OF COLON 04/26/2008  . HEPATITIS, HX OF 04/26/2008  . COLONIC POLYPS, HYPERPLASTIC, HX OF 04/26/2008  . HEMORRHOIDS, INTERNAL 01/11/2008  . DERMATITIS, SEBORRHEIC NEC 01/28/2007  . Hyperlipidemia 01/19/2007  . CARPAL TUNNEL SYNDROME, BILATERAL 01/19/2007  . IBS 01/19/2007  . OVERACTIVE BLADDER  01/19/2007  . POSTMENOPAUSAL STATUS 01/19/2007  . ECZEMA 01/19/2007  . CONNECTIVE TISSUE DISEASE 01/19/2007  . OSTEOARTHRITIS 01/19/2007  . ALLERGY 01/19/2007   Past Medical History  Diagnosis Date  . HLD (hyperlipidemia)   . Osteoarthritis of cervical spine   . Connective tissue disease ([place])   . DDD (degenerative disc disease), cervical   . DDD (degenerative disc disease), lumbar   . Spinal stenosis   . Obesity   . Endometrial polyp     not resolved with D&C (following)  . Bradycardia   . PONV (postoperative nausea and vomiting)   . GERD (gastroesophageal reflux disease)     gas/belching issues-was on protonix for short time-not since 6 months  . Mixed connective tissue disease ([place]) diagnosed 5 years ago    no problems since diagnosis  . Allergy   . Renal calculi   . Multiple thyroid nodules 01/24/2014   Past Surgical History  Procedure Laterality Date  . Hand surgery      carpal tunnel release bilateral Dr. Daylene Katayama  . Tonsillectomy    . Appendectomy    . Dilation and curettage of uterus      fibroids  . Dilation and curettage of uterus  01/2005    endometrial polyp  . Endometrial biopsy  2008    fibroid  . Tubal ligation      1973  . Colonoscopy    . Upper gastrointestinal endoscopy    . Hysteroscopic resection       polyps of PMB   Social History  Substance Use Topics  . Smoking status: Former Smoker    Types: Cigarettes    Quit date: 07/22/1965  . Smokeless tobacco: Never Used  . Alcohol Use: No   Family History  Problem Relation Age of Onset  . Coronary artery disease Father   . Heart attack Father     deceased at 3  . Diabetes Maternal Grandmother   . Goiter Maternal Grandmother   . Diabetes Cousin   . Cancer Grandchild     rare type (dying at age 9)  . Colon cancer Neg Hx   . Esophageal cancer Neg Hx   . Stomach cancer Neg Hx   . Rectal cancer Neg Hx    Allergies  Allergen Reactions  . Codeine Nausea And Vomiting  .  Hydrocodone-Acetaminophen     REACTION: reaction not known  . Penicillins Hives   Current Outpatient Prescriptions on File Prior to Visit  Medication Sig Dispense Refill  . Ascorbic Acid (  VITAMIN C) 1000 MG tablet Take 1,000 mg by mouth at bedtime. Reported on 09/22/2015    . azithromycin (ZITHROMAX) 250 MG tablet Take 2 tabs today, then 1 tab daily x 4 days 6 tablet 0  . Cholecalciferol (VITAMIN D-3 PO) Take 1,000 Units by mouth 2 (two) times daily.     . Clobetasol Propionate Emulsion 0.05 % topical foam Apply 1 application topically 2 (two) times a week.     . estradiol (VIVELLE-DOT) 0.025 MG/24HR Place 1 patch onto the skin 2 (two) times a week. 24 patch 4  . fluticasone (FLONASE) 50 MCG/ACT nasal spray Place 2 sprays into both nostrils daily. (Patient taking differently: Place 2 sprays into both nostrils daily. ) 16 g 11  . ibuprofen (ADVIL,MOTRIN) 200 MG tablet Take 400 mg by mouth as needed.    . Lactobacillus Rhamnosus, GG, (CULTURELLE PO) Take 1 tablet by mouth daily. Culturelle    . levothyroxine (SYNTHROID, LEVOTHROID) 50 MCG tablet Take 50 mcg 3 times a week (Mon, Wed, Fri),25 mcg 4 times a week (Tues, [place], Sat, Sun)    . Multiple Vitamins-Minerals (HAIR/SKIN/NAILS PO) Take 1 tablet by mouth daily.     . polyethylene glycol (MIRALAX / GLYCOLAX) packet You may use this as needed for more acute constipation.  Follow label instructions. 14 each 0  . progesterone (PROMETRIUM) 100 MG capsule 1 tablet po daily 90 capsule 4  . valACYclovir (VALTREX) 1000 MG tablet TAKE ONE TABLET BY MOUTH ONCE DAILY AS NEEDED FOR FEVER BLISTERS 30 tablet 5   No current facility-administered medications on file prior to visit.    Review of Systems Review of Systems  Constitutional: Negative for fever, appetite change, fatigue and unexpected weight change. pos for general dislike of exercise  Eyes: Negative for pain and visual disturbance.  Respiratory: Negative for cough and shortness of breath.     Cardiovascular: Negative for cp or palpitations    Gastrointestinal: Negative for nausea, diarrhea and constipation.  Genitourinary: Negative for urgency and frequency.  Skin: Negative for pallor or rash   Neurological: Negative for weakness, light-headedness, numbness and headaches.  Hematological: Negative for adenopathy. Does not bruise/bleed easily.  Psychiatric/Behavioral: Negative for dysphoric mood. The patient is not nervous/anxious.         Objective:   Physical Exam  Constitutional: She appears well-developed and well-nourished. No distress.  obese and well appearing   HENT:  Head: Normocephalic and atraumatic.  Right Ear: External ear normal.  Left Ear: External ear normal.  Mouth/Throat: Oropharynx is clear and moist.  Eyes: Conjunctivae and EOM are normal. Pupils are equal, round, and reactive to light. No scleral icterus.  Neck: Normal range of motion. Neck supple. No JVD present. Carotid bruit is not present. No thyromegaly present.  Cardiovascular: Normal rate, regular rhythm, normal heart sounds and intact distal pulses.  Exam reveals no gallop.   Pulmonary/Chest: Effort normal and breath sounds normal. No respiratory distress. She has no wheezes. She exhibits no tenderness.  Abdominal: Soft. Bowel sounds are normal. She exhibits no distension, no abdominal bruit and no mass. There is no tenderness.  Genitourinary: No breast swelling, tenderness, discharge or bleeding.  Breast exam: No mass, nodules, thickening, tenderness, bulging, retraction, inflamation, nipple discharge or skin changes noted.  No axillary or clavicular LA.      Musculoskeletal: Normal range of motion. She exhibits no edema or tenderness.  Lymphadenopathy:    She has no cervical adenopathy.  Neurological: She is alert. She has normal reflexes. No  cranial nerve deficit. She exhibits normal muscle tone. Coordination normal.  Skin: Skin is warm and dry. No rash noted. No erythema. No pallor.  Some  angiomas and SKs noted  Fair complexion   Psychiatric: She has a normal mood and affect.          Assessment & Plan:   Problem List Items Addressed This Visit      Endocrine   Hypothyroidism - Primary    Continues to see Dr Ronnald Collum for thyroid nodules and hypothyroidism Hypothyroidism  Pt has no clinical changes No change in energy level/ hair or skin/ edema and no tremor Lab Results  Component Value Date   TSH 0.60 09/20/2015             Other   Hyperlipidemia    Disc goals for lipids and reasons to control them Rev labs with pt Rev low sat fat diet in detail Cannot tolerate statins  Diet can still improve Rev diet and handout given        Routine general medical examination at a health care facility    Reviewed health habits including diet and exercise and skin cancer prevention Reviewed appropriate screening tests for age  Also reviewed health mt list, fam hx and immunization status , as well as social and family history   See HPI Labs reviewed amw visit reviewed Think about starting an exercise program- it would help your health in many ways and allow you to stay independent longer Continue your vitamin D for bone health See your gyn doctor this summer -discuss your long term plan for hormones  For cholesterol   Avoid red meat/ fried foods/ egg yolks/ fatty breakfast meats/ butter, cheese and high fat dairy/ and shellfish    See the handout for cholesterol

## 2015-11-29 ENCOUNTER — Telehealth: Payer: Self-pay | Admitting: Internal Medicine

## 2015-11-29 NOTE — Telephone Encounter (Signed)
Spoke with patient and she states she has a hemorrhoid that is very painful. She thinks it may need to be "lanced". Discussed with patient that a surgeon would need to do this. Patient will call her PCP for OV today since we have no OV available today and she may need to see a Psychologist, sport and exercise.

## 2015-12-01 ENCOUNTER — Ambulatory Visit: Payer: PPO | Admitting: Family Medicine

## 2015-12-01 DIAGNOSIS — K648 Other hemorrhoids: Secondary | ICD-10-CM | POA: Diagnosis not present

## 2015-12-04 DIAGNOSIS — M7541 Impingement syndrome of right shoulder: Secondary | ICD-10-CM | POA: Diagnosis not present

## 2015-12-12 ENCOUNTER — Encounter: Payer: Self-pay | Admitting: Internal Medicine

## 2016-01-03 DIAGNOSIS — M7541 Impingement syndrome of right shoulder: Secondary | ICD-10-CM | POA: Diagnosis not present

## 2016-01-04 DIAGNOSIS — Q842 Other congenital malformations of hair: Secondary | ICD-10-CM | POA: Diagnosis not present

## 2016-01-04 DIAGNOSIS — E042 Nontoxic multinodular goiter: Secondary | ICD-10-CM | POA: Diagnosis not present

## 2016-01-04 DIAGNOSIS — N959 Unspecified menopausal and perimenopausal disorder: Secondary | ICD-10-CM | POA: Diagnosis not present

## 2016-01-04 DIAGNOSIS — E039 Hypothyroidism, unspecified: Secondary | ICD-10-CM | POA: Diagnosis not present

## 2016-01-04 DIAGNOSIS — E061 Subacute thyroiditis: Secondary | ICD-10-CM | POA: Diagnosis not present

## 2016-01-18 DIAGNOSIS — N959 Unspecified menopausal and perimenopausal disorder: Secondary | ICD-10-CM | POA: Diagnosis not present

## 2016-01-18 DIAGNOSIS — Q842 Other congenital malformations of hair: Secondary | ICD-10-CM | POA: Diagnosis not present

## 2016-01-18 DIAGNOSIS — E042 Nontoxic multinodular goiter: Secondary | ICD-10-CM | POA: Diagnosis not present

## 2016-01-18 DIAGNOSIS — E061 Subacute thyroiditis: Secondary | ICD-10-CM | POA: Diagnosis not present

## 2016-01-18 DIAGNOSIS — E039 Hypothyroidism, unspecified: Secondary | ICD-10-CM | POA: Diagnosis not present

## 2016-02-20 DIAGNOSIS — Z6829 Body mass index (BMI) 29.0-29.9, adult: Secondary | ICD-10-CM | POA: Diagnosis not present

## 2016-02-20 DIAGNOSIS — M4317 Spondylolisthesis, lumbosacral region: Secondary | ICD-10-CM | POA: Diagnosis not present

## 2016-02-20 DIAGNOSIS — M545 Low back pain: Secondary | ICD-10-CM | POA: Diagnosis not present

## 2016-02-21 DIAGNOSIS — R5381 Other malaise: Secondary | ICD-10-CM | POA: Diagnosis not present

## 2016-02-21 DIAGNOSIS — E559 Vitamin D deficiency, unspecified: Secondary | ICD-10-CM | POA: Diagnosis not present

## 2016-03-15 DIAGNOSIS — L565 Disseminated superficial actinic porokeratosis (DSAP): Secondary | ICD-10-CM | POA: Diagnosis not present

## 2016-03-15 DIAGNOSIS — I788 Other diseases of capillaries: Secondary | ICD-10-CM | POA: Diagnosis not present

## 2016-03-15 DIAGNOSIS — L821 Other seborrheic keratosis: Secondary | ICD-10-CM | POA: Diagnosis not present

## 2016-03-15 DIAGNOSIS — L570 Actinic keratosis: Secondary | ICD-10-CM | POA: Diagnosis not present

## 2016-03-15 DIAGNOSIS — Z85828 Personal history of other malignant neoplasm of skin: Secondary | ICD-10-CM | POA: Diagnosis not present

## 2016-03-18 DIAGNOSIS — M545 Low back pain: Secondary | ICD-10-CM | POA: Diagnosis not present

## 2016-03-21 DIAGNOSIS — M545 Low back pain: Secondary | ICD-10-CM | POA: Diagnosis not present

## 2016-04-03 DIAGNOSIS — Z1231 Encounter for screening mammogram for malignant neoplasm of breast: Secondary | ICD-10-CM | POA: Diagnosis not present

## 2016-04-03 LAB — HM MAMMOGRAPHY

## 2016-04-05 ENCOUNTER — Encounter: Payer: Self-pay | Admitting: Family Medicine

## 2016-04-08 ENCOUNTER — Encounter: Payer: Self-pay | Admitting: *Deleted

## 2016-04-24 ENCOUNTER — Ambulatory Visit: Payer: PPO | Admitting: Nurse Practitioner

## 2016-05-06 ENCOUNTER — Encounter: Payer: Self-pay | Admitting: Nurse Practitioner

## 2016-05-06 ENCOUNTER — Ambulatory Visit (INDEPENDENT_AMBULATORY_CARE_PROVIDER_SITE_OTHER): Payer: PPO | Admitting: Nurse Practitioner

## 2016-05-06 VITALS — BP 114/72 | HR 72 | Ht 65.0 in | Wt 182.0 lb

## 2016-05-06 DIAGNOSIS — Z01419 Encounter for gynecological examination (general) (routine) without abnormal findings: Secondary | ICD-10-CM

## 2016-05-06 DIAGNOSIS — Z79890 Hormone replacement therapy (postmenopausal): Secondary | ICD-10-CM | POA: Diagnosis not present

## 2016-05-06 DIAGNOSIS — E042 Nontoxic multinodular goiter: Secondary | ICD-10-CM

## 2016-05-06 MED ORDER — ESTRADIOL 0.025 MG/24HR TD PTTW: 1.0000 | MEDICATED_PATCH | TRANSDERMAL | 4 refills | Status: DC

## 2016-05-06 MED ORDER — PROGESTERONE MICRONIZED 100 MG PO CAPS: ORAL_CAPSULE | ORAL | 4 refills | Status: DC

## 2016-05-06 NOTE — Patient Instructions (Signed)

## 2016-05-06 NOTE — Progress Notes (Signed)
70 y.o. [id number] Married  Caucasian Fe here for annual exam.     No new health problems this year.  Wants to continue on HRT as she is still very symptomatic with vaso symptoms.  Denies any vaginal spotting since 2015.   She is seeing a orthopedic back surgeon for persistent pain and trying to get another MRI.  Patient's last menstrual period was 07/22/1989 (approximate).          Sexually active: Yes.    The current method of family planning is post menopausal status.    Exercising: No.  The patient does not participate in regular exercise at present. Smoker:  no  Health Maintenance: Pap:  10/01/2010 negative MMG:  04/03/16, Bi-Rads 1:  Negative Colonoscopy:  04/15/12 1 polyp repeat in 5 years BMD:   10/02/12 spine: +2.8; left hip neck +1.5; right hip neck + 2.5 TDaP:  01/28/2007 Shingles: 09/09/2006 Pneumonia: 09/15/12 (Pneumovax), 04/13/14 (Prevnar 13) Hep C: 10/20/15 Labs: PCP maintains all labs   reports that she quit smoking about 50 years ago. Her smoking use included Cigarettes. She has never used smokeless tobacco. She reports that she does not drink alcohol or use drugs.  Past Medical History:  Diagnosis Date  . Allergy   . Bradycardia   . Connective tissue disease ([place])   . DDD (degenerative disc disease), cervical   . DDD (degenerative disc disease), lumbar   . Endometrial polyp    not resolved with D&C (following)  . GERD (gastroesophageal reflux disease)    gas/belching issues-was on protonix for short time-not since 6 months  . HLD (hyperlipidemia)   . Mixed connective tissue disease ([place]) diagnosed 5 years ago   no problems since diagnosis  . Multiple thyroid nodules 01/24/2014  . Obesity   . Osteoarthritis of cervical spine   . PONV (postoperative nausea and vomiting)   . Renal calculi   . Spinal stenosis     Past Surgical History:  Procedure Laterality Date  . APPENDECTOMY    . COLONOSCOPY    . DILATION AND CURETTAGE OF UTERUS     fibroids  . DILATION AND  CURETTAGE OF UTERUS  01/2005   endometrial polyp  . ENDOMETRIAL BIOPSY  2008   fibroid  . HAND SURGERY     carpal tunnel release bilateral Dr. Daylene Katayama  . hysteroscopic resection      polyps of PMB  . TONSILLECTOMY    . TUBAL LIGATION     1973  . UPPER GASTROINTESTINAL ENDOSCOPY      Current Outpatient Prescriptions  Medication Sig Dispense Refill  . Cholecalciferol (VITAMIN D-3 PO) Take 1,000 Units by mouth daily.     . Clobetasol Propionate Emulsion 0.05 % topical foam Apply 1 application topically 2 (two) times a week.     . estradiol (VIVELLE-DOT) 0.025 MG/24HR Place 1 patch onto the skin 2 (two) times a week. 24 patch 4  . fluticasone (FLONASE) 50 MCG/ACT nasal spray Place 2 sprays into both nostrils daily. (Patient taking differently: Place 2 sprays into both nostrils daily. ) 16 g 11  . ibuprofen (ADVIL,MOTRIN) 200 MG tablet Take 400 mg by mouth as needed.    . Lactobacillus Rhamnosus, GG, (CULTURELLE PO) Take 1 tablet by mouth daily. Culturelle    . levothyroxine (SYNTHROID, LEVOTHROID) 50 MCG tablet Take 50 mcg 3 times a week (Mon, Wed, Fri),25 mcg 4 times a week (Tues, [place], Sat, Sun)    . Multiple Vitamins-Minerals (HAIR/SKIN/NAILS PO) Take 1 tablet by mouth daily.     Marland Kitchen  Omega-3 Fatty Acids (FISH OIL) 1000 MG CAPS Take 1 capsule by mouth daily.    . polyethylene glycol (MIRALAX / GLYCOLAX) packet You may use this as needed for more acute constipation.  Follow label instructions. 14 each 0  . progesterone (PROMETRIUM) 100 MG capsule 1 tablet po daily 90 capsule 4  . valACYclovir (VALTREX) 1000 MG tablet TAKE ONE TABLET BY MOUTH ONCE DAILY AS NEEDED FOR FEVER BLISTERS 30 tablet 5   No current facility-administered medications for this visit.     Family History  Problem Relation Age of Onset  . Coronary artery disease Father   . Heart attack Father     deceased at 17  . Diabetes Maternal Grandmother   . Goiter Maternal Grandmother   . Diabetes Cousin   . Cancer  Grandchild     rare type (dying at age 39)  . Colon cancer Neg Hx   . Esophageal cancer Neg Hx   . Stomach cancer Neg Hx   . Rectal cancer Neg Hx     ROS:  Pertinent items are noted in HPI.  Otherwise, a comprehensive ROS was negative.  Exam:   BP 114/72 (BP Location: Right Arm, Patient Position: Sitting, Cuff Size: Normal)   Pulse 72   Ht 5\' 5"  (1.651 m)   Wt 182 lb (82.6 kg)   LMP 07/22/1989 (Approximate)   BMI 30.29 kg/m  Height: 5\' 5"  (165.1 cm) Ht Readings from Last 3 Encounters:  05/06/16 5\' 5"  (1.651 m)  09/25/15 5\' 5"  (1.651 m)  09/22/15 5\' 5"  (1.651 m)    General appearance: alert, cooperative and appears stated age Head: Normocephalic, without obvious abnormality, atraumatic Neck: no adenopathy, supple, symmetrical, trachea midline and thyroid normal to inspection with several small nodules to palpation Lungs: clear to auscultation bilaterally Breasts: normal appearance, no masses or tenderness Heart: regular rate and rhythm Abdomen: soft, non-tender; no masses,  no organomegaly Extremities: extremities normal, atraumatic, no cyanosis or edema Skin: Skin color, texture, turgor normal. No rashes or lesions Lymph nodes: Cervical, supraclavicular, and axillary nodes normal. No abnormal inguinal nodes palpated Neurologic: Grossly normal   Pelvic: External genitalia:  no lesions              Urethra:  normal appearing urethra with no masses, tenderness or lesions              Bartholin's and Skene's: normal                 Vagina: normal appearing vagina with normal color and discharge, no lesions              Cervix: anteverted              Pap taken: No. Bimanual Exam:  Uterus:  normal size, contour, position, consistency, mobility, non-tender              Adnexa: no mass, fullness, tenderness               Rectovaginal: Confirms               Anus:  normal sphincter tone, no lesions  Chaperone present: yes  A:  Well Woman with normal exam  Postmenopausal on  HRT S/P endo biopsy 01/18/14 for PMB and was negative for hyperplasia             History of DDD, hyperlipidemia, renal calculi  History of thyroid nodules - followed by PCP   P:   Reviewed health and  wellness pertinent to exam  Pap smear as above  Mammogram is due 03/2017  Refill on Vivelle dot 0.025 and Prometrium for a year - did discuss trying again this fall of using 1/2 patch to taper.  Counseled with risk of DVT, CVA, cancer, etc.   Counseled on breast self exam, mammography screening, use and side effects of HRT, adequate intake of calcium and vitamin D, diet and exercise, Kegel's exercises return annually or prn  An After Visit Summary was printed and given to the patient.

## 2016-05-12 NOTE — Progress Notes (Signed)
Encounter reviewed by Dr. Brook Amundson C. Silva.  

## 2016-05-15 DIAGNOSIS — H25099 Other age-related incipient cataract, unspecified eye: Secondary | ICD-10-CM | POA: Diagnosis not present

## 2016-05-15 DIAGNOSIS — L820 Inflamed seborrheic keratosis: Secondary | ICD-10-CM | POA: Diagnosis not present

## 2016-05-15 DIAGNOSIS — L570 Actinic keratosis: Secondary | ICD-10-CM | POA: Diagnosis not present

## 2016-05-15 DIAGNOSIS — L218 Other seborrheic dermatitis: Secondary | ICD-10-CM | POA: Diagnosis not present

## 2016-05-15 DIAGNOSIS — D485 Neoplasm of uncertain behavior of skin: Secondary | ICD-10-CM | POA: Diagnosis not present

## 2016-05-15 DIAGNOSIS — Z85828 Personal history of other malignant neoplasm of skin: Secondary | ICD-10-CM | POA: Diagnosis not present

## 2016-06-07 DIAGNOSIS — M5416 Radiculopathy, lumbar region: Secondary | ICD-10-CM | POA: Diagnosis not present

## 2016-06-07 DIAGNOSIS — Z85828 Personal history of other malignant neoplasm of skin: Secondary | ICD-10-CM | POA: Diagnosis not present

## 2016-06-07 DIAGNOSIS — M545 Low back pain: Secondary | ICD-10-CM | POA: Diagnosis not present

## 2016-06-07 DIAGNOSIS — L309 Dermatitis, unspecified: Secondary | ICD-10-CM | POA: Diagnosis not present

## 2016-06-27 DIAGNOSIS — M5416 Radiculopathy, lumbar region: Secondary | ICD-10-CM | POA: Diagnosis not present

## 2016-06-27 DIAGNOSIS — M48061 Spinal stenosis, lumbar region without neurogenic claudication: Secondary | ICD-10-CM | POA: Diagnosis not present

## 2016-07-01 ENCOUNTER — Telehealth: Payer: Self-pay | Admitting: Family Medicine

## 2016-07-01 NOTE — Telephone Encounter (Signed)
[facility] Patient Name: Morgan [name] DOB: Dec 03, 1945 Initial Comment Caller states she has a sore throat and scratchy. What can she do for it. Coughing. Fatigue. Nurse Assessment Nurse: Markus Daft, RN, Sherre Poot Date/Time Eilene Ghazi Time): 07/01/2016 11:02:56 AM Confirm and document reason for call. If symptomatic, describe symptoms. ---Caller states she has a sore throat and scratchy 2 days ago, and runny nose, coughing off/on, but fatigued today. Does the patient have any new or worsening symptoms? ---Yes Will a triage be completed? ---Yes Related visit to physician within the last 2 weeks? ---No Does the PT have any chronic conditions? (i.e. diabetes, asthma, etc.) ---No Is this a behavioral health or substance abuse call? ---No Guidelines Guideline Title Affirmed Question Affirmed Notes Sore Throat SEVERE (e.g., excruciating) throat pain Final Disposition User See Physician within [place], [place], [place] Comments No available appts with Dr. Glori Bickers today or tomorrow. Pt does not want to come til afternoon tomorrow. Appt made with [name] Hurter NP at 1 pm Referrals REFERRED TO PCP OFFICE Disagree/Comply: Comply

## 2016-07-01 NOTE — Telephone Encounter (Signed)
Pt has appt with Avie Echevaria NP on 07/02/16 at 1pm.

## 2016-07-02 ENCOUNTER — Ambulatory Visit (INDEPENDENT_AMBULATORY_CARE_PROVIDER_SITE_OTHER): Payer: PPO | Admitting: Internal Medicine

## 2016-07-02 ENCOUNTER — Encounter: Payer: Self-pay | Admitting: Internal Medicine

## 2016-07-02 VITALS — BP 126/74 | HR 52 | Temp 98.7°F | Wt 182.0 lb

## 2016-07-02 DIAGNOSIS — J069 Acute upper respiratory infection, unspecified: Secondary | ICD-10-CM | POA: Diagnosis not present

## 2016-07-02 DIAGNOSIS — B9789 Other viral agents as the cause of diseases classified elsewhere: Secondary | ICD-10-CM | POA: Diagnosis not present

## 2016-07-02 NOTE — Progress Notes (Signed)
HPI  Pt presents to the clinic today with c/o runny nose, sore throat and cough. This started 3 days ago. She is blowing clear mucous out of her nose. She denies difficulty swallowing. The cough is non productive. She denies fever, chills or body aches but has been fatigued. She has tried Ibuprofen and an antihistamine OTC once. She has not had sick contacts that she is aware of. She does have a history of environmental allergies. Her flu and pneumonia vaccines are UTD.  Review of Systems        Past Medical History:  Diagnosis Date  . Allergy   . Bradycardia   . Connective tissue disease ([place])   . DDD (degenerative disc disease), cervical   . DDD (degenerative disc disease), lumbar   . Endometrial polyp    not resolved with D&C (following)  . GERD (gastroesophageal reflux disease)    gas/belching issues-was on protonix for short time-not since 6 months  . HLD (hyperlipidemia)   . Mixed connective tissue disease ([place]) diagnosed 5 years ago   no problems since diagnosis  . Multiple thyroid nodules 01/24/2014  . Obesity   . Osteoarthritis of cervical spine   . PONV (postoperative nausea and vomiting)   . Renal calculi   . Spinal stenosis     Family History  Problem Relation Age of Onset  . Coronary artery disease Father   . Heart attack Father     deceased at 61  . Diabetes Maternal Grandmother   . Goiter Maternal Grandmother   . Diabetes Cousin   . Cancer Grandchild     rare type (dying at age 22)  . Colon cancer Neg Hx   . Esophageal cancer Neg Hx   . Stomach cancer Neg Hx   . Rectal cancer Neg Hx     Social History   Social History  . Marital status: Married    Spouse name: N/A  . Number of children: N/A  . Years of education: N/A   Occupational History  . Not on file.   Social History Main Topics  . Smoking status: Former Smoker    Types: Cigarettes    Quit date: 07/22/1965  . Smokeless tobacco: Never Used  . Alcohol use No  . Drug use: No  . Sexual  activity: Yes    Birth control/ protection: Surgical     Comment: BTL   Other Topics Concern  . Not on file   Social History Narrative  . No narrative on file    Allergies  Allergen Reactions  . Codeine Nausea And Vomiting  . Hydrocodone-Acetaminophen     REACTION: reaction not known  . Penicillins Hives     Constitutional: Positive fatigue. Denies headache, fever or abrupt weight changes.  HEENT:  Positive runny nose, sore throat. Denies eye redness, eye pain, pressure behind the eyes, facial pain, nasal congestion, ear pain, ringing in the ears, wax buildup, or bloody nose. Respiratory: Positive cough. Denies difficulty breathing or shortness of breath.  Cardiovascular: Denies chest pain, chest tightness, palpitations or swelling in the hands or feet.   No other specific complaints in a complete review of systems (except as listed in HPI above).  Objective:   BP 126/74   Pulse (!) 52   Temp 98.7 F (37.1 C) (Oral)   Wt 182 lb (82.6 kg)   LMP 07/22/1989 (Approximate)   SpO2 97%   BMI 30.29 kg/m   Wt Readings from Last 3 Encounters:  05/06/16 182 lb (82.6  kg)  09/25/15 181 lb 4 oz (82.2 kg)  09/22/15 182 lb 12 oz (82.9 kg)     General: Appears her stated age, in NAD. HEENT: Head: normal shape and size, no sinus tenderness noted; Eyes: sclera white, no icterus, conjunctiva pink; Ears: Tm's gray and intact, normal light reflex, + serous effusion bilaterally; Nose: mucosa pink and moist, septum midline; Throat/Mouth: + PND. Teeth present, mucosa pink and moist, no exudate noted, no lesions or ulcerations noted.  Neck: No cervical lymphadenopathy.  Cardiovascular: Normal rate and rhythm. S1,S2 noted.  No murmur, rubs or gallops noted.  Pulmonary/Chest: Normal effort and positive vesicular breath sounds. No respiratory distress. No wheezes, rales or ronchi noted.       Assessment & Plan:   Viral Upper Respiratory Infection with Cough:  Get some rest and drink  plenty of water Do salt water gargles for the sore throat Start Mucinex 600 mg every 12 hours 80 mg Depo IM today for sore throat Delsym as needed for cough  RTC as needed or if symptoms persist.   Webb Silversmith, NP

## 2016-07-02 NOTE — Patient Instructions (Signed)
Cough, Adult Introduction A cough helps to clear your throat and lungs. A cough may last only 2-3 weeks (acute), or it may last longer than 8 weeks (chronic). Many different things can cause a cough. A cough may be a sign of an illness or another medical condition. Follow these instructions at home:  Pay attention to any changes in your cough.  Take medicines only as told by your doctor.  If you were prescribed an antibiotic medicine, take it as told by your doctor. Do not stop taking it even if you start to feel better.  Talk with your doctor before you try using a cough medicine.  Drink enough fluid to keep your pee (urine) clear or pale yellow.  If the air is dry, use a cold steam vaporizer or humidifier in your home.  Stay away from things that make you cough at work or at home.  If your cough is worse at night, try using extra pillows to raise your head up higher while you sleep.  Do not smoke, and try not to be around smoke. If you need help quitting, ask your doctor.  Do not have caffeine.  Do not drink alcohol.  Rest as needed. Contact a doctor if:  You have new problems (symptoms).  You cough up yellow fluid (pus).  Your cough does not get better after 2-3 weeks, or your cough gets worse.  Medicine does not help your cough and you are not sleeping well.  You have pain that gets worse or pain that is not helped with medicine.  You have a fever.  You are losing weight and you do not know why.  You have night sweats. Get help right away if:  You cough up blood.  You have trouble breathing.  Your heartbeat is very fast. This information is not intended to replace advice given to you by your health care provider. Make sure you discuss any questions you have with your health care provider. Document Released: 03/21/2011 Document Revised: 12/14/2015 Document Reviewed: 09/14/2014  2017 Elsevier  

## 2016-07-03 ENCOUNTER — Other Ambulatory Visit: Payer: Self-pay | Admitting: Nurse Practitioner

## 2016-07-03 NOTE — Telephone Encounter (Signed)
Medication refill request: valACYclovir  1000mg  Last AEX:  05/06/16 PG Next AEX: not scheduled Last MMG (if hormonal medication request): 04/03/16 BIRADS 1 negative Refill authorized: 04/28/15 #30 w/5 refills; today please advise

## 2016-07-18 DIAGNOSIS — H43811 Vitreous degeneration, right eye: Secondary | ICD-10-CM | POA: Diagnosis not present

## 2016-07-31 ENCOUNTER — Other Ambulatory Visit: Payer: Self-pay | Admitting: Family Medicine

## 2016-07-31 NOTE — Telephone Encounter (Signed)
That is ok to refill for a year

## 2016-07-31 NOTE — Telephone Encounter (Signed)
done

## 2016-07-31 NOTE — Telephone Encounter (Signed)
Pt had a recent acute appt with another provider but no recent f/u or CPE with Dr. Glori Bickers and no future appts., please advise

## 2016-08-12 ENCOUNTER — Encounter: Payer: Self-pay | Admitting: Family Medicine

## 2016-08-12 ENCOUNTER — Ambulatory Visit (INDEPENDENT_AMBULATORY_CARE_PROVIDER_SITE_OTHER): Payer: PPO | Admitting: Family Medicine

## 2016-08-12 ENCOUNTER — Ambulatory Visit (INDEPENDENT_AMBULATORY_CARE_PROVIDER_SITE_OTHER)
Admission: RE | Admit: 2016-08-12 | Discharge: 2016-08-12 | Disposition: A | Discharge status: Home / Self Care | Payer: PPO | Source: Ambulatory Visit | Attending: Family Medicine | Admitting: Family Medicine

## 2016-08-12 VITALS — BP 114/70 | HR 63 | Temp 98.7°F | Ht 65.0 in | Wt 185.0 lb

## 2016-08-12 DIAGNOSIS — M4800 Spinal stenosis, site unspecified: Secondary | ICD-10-CM | POA: Diagnosis not present

## 2016-08-12 DIAGNOSIS — E6609 Other obesity due to excess calories: Secondary | ICD-10-CM | POA: Diagnosis not present

## 2016-08-12 DIAGNOSIS — M79641 Pain in right hand: Secondary | ICD-10-CM

## 2016-08-12 DIAGNOSIS — E039 Hypothyroidism, unspecified: Secondary | ICD-10-CM

## 2016-08-12 DIAGNOSIS — R202 Paresthesia of skin: Secondary | ICD-10-CM | POA: Insufficient documentation

## 2016-08-12 DIAGNOSIS — Z6830 Body mass index (BMI) 30.0-30.9, adult: Secondary | ICD-10-CM

## 2016-08-12 DIAGNOSIS — M359 Systemic involvement of connective tissue, unspecified: Secondary | ICD-10-CM

## 2016-08-12 DIAGNOSIS — M19041 Primary osteoarthritis, right hand: Secondary | ICD-10-CM | POA: Diagnosis not present

## 2016-08-12 DIAGNOSIS — M5136 Other intervertebral disc degeneration, lumbar region: Secondary | ICD-10-CM

## 2016-08-12 DIAGNOSIS — Z23 Encounter for immunization: Secondary | ICD-10-CM

## 2016-08-12 LAB — COMPREHENSIVE METABOLIC PANEL
ALBUMIN: 4.1 g/dL (ref 3.5–5.2)
ALK PHOS: 69 U/L (ref 39–117)
ALT: 16 U/L (ref 0–35)
AST: 18 U/L (ref 0–37)
BUN: 12 mg/dL (ref 6–23)
CALCIUM: 9 mg/dL (ref 8.4–10.5)
CHLORIDE: 105 meq/L (ref 96–112)
CO2: 30 mEq/L (ref 19–32)
CREATININE: 0.71 mg/dL (ref 0.40–1.20)
GFR: 86.5 mL/min (ref >60.00)
Glucose, Bld: 88 mg/dL (ref 70–99)
POTASSIUM: 4 meq/L (ref 3.5–5.1)
Sodium: 140 mEq/L (ref 135–145)
Total Bilirubin: 0.4 mg/dL (ref 0.2–1.2)
Total Protein: 6.5 g/dL (ref 6.0–8.3)

## 2016-08-12 LAB — HEMOGLOBIN A1C: Hgb A1c MFr Bld: 5.3 % (ref 4.6–6.5)

## 2016-08-12 LAB — SEDIMENTATION RATE: SED RATE: 1 mm/h (ref 0–30)

## 2016-08-12 NOTE — Assessment & Plan Note (Signed)
Dorsal medial R hand with tenderness over 4,5th metacarpals (no swelling) with pain on pro, supination  Xray today  Re assuring exam  Ice for now  Adv further with result

## 2016-08-12 NOTE — Assessment & Plan Note (Signed)
Pt sees rheumatology (Dr Garen Grams) for this  ? If linked to her new neuropathy symptoms in feet  Also to her itchy skin condition  Not on any immunosuppressive meds currently

## 2016-08-12 NOTE — Assessment & Plan Note (Signed)
Per pt relatively stable  Doubt this is the cause of her numbness of all toes

## 2016-08-12 NOTE — Assessment & Plan Note (Signed)
Discussed how this problem influences overall health and the risks it imposes  Reviewed plan for weight loss with lower calorie diet (via better food choices and also portion control or program like weight watchers) and exercise building up to or more than 30 minutes 5 days per week including some aerobic activity    

## 2016-08-12 NOTE — Progress Notes (Signed)
Pre visit review using our clinic review tool, if applicable. No additional management support is needed unless otherwise documented below in the visit note. 

## 2016-08-12 NOTE — Patient Instructions (Signed)
Hand xray now  Flu shot today  Labs today for metabolic causes for neuropathy -will make plan when they return   You can put some ice on your hand when it hurts  Also avoid excessive use of the hand that causes pain as well   Stay with Dr Ronnald Collum for your thyroid condition   Take care of yourself

## 2016-08-12 NOTE — Assessment & Plan Note (Signed)
Lumbar Stable per pt Doubt causing all 10 toes to be numb

## 2016-08-12 NOTE — Addendum Note (Signed)
Addended by: Magdalen Spatz C on: 08/12/2016 06:04 PM   Modules accepted: Orders

## 2016-08-12 NOTE — Assessment & Plan Note (Signed)
Pt plans to continue seeing Dr Ronnald Collum for this and thyroid nodules

## 2016-08-12 NOTE — Progress Notes (Signed)
Subjective:    Patient ID: Morgan [name], female    DOB: [birth year], 71 y.o.   MRN: [id number]  HPI Here for multiple medical concerns   Wt Readings from Last 3 Encounters:  08/12/16 185 lb (83.9 kg)  07/02/16 182 lb (82.6 kg)  05/06/16 182 lb (82.6 kg)   bmi is 30.7   Numbness in her feet /toes   (not in fingers) Going on for several years  She thought it was from her spinal stenosis occ electric current going up back of R leg   Worse in the cold   Wonders if she could have neuropathy  ? If blood sugar problems   Has hx of mixed connective tissue disease with abn labs in the past  Has been in remission  She does go to rheum once per year- Dr Garen Grams - doing well lately    Last glucose readings were 88, 82, 91, 82  Diabetes runs in family  No excessive thirst or urination  No wt loss    R hand also some pain in R hand -medial wrist/ sharp and if she holds something it is worse  For example doing her hair  From pinkie finger to wrist  No n/t  No injury  Lots of reped motion - cleaning/ hair  Is her dominant hand also   Has had carpal tunnel surgery in both hands    Hypothyroid  Sees Dr Jerilynn Mages Also nodules More $ to go there since he left [facility]     Review of Systems Review of Systems  Constitutional: Negative for fever, appetite change, fatigue and unexpected weight change.  Eyes: Negative for pain and visual disturbance.  Respiratory: Negative for cough and shortness of breath.   Cardiovascular: Negative for cp or palpitations    Gastrointestinal: Negative for nausea, diarrhea and constipation.  Genitourinary: Negative for urgency and frequency.  Skin: Negative for pallor or rash  pos for hx of severe itching rash that comes and goes -sees derm (? Auto immune)  Neurological: Negative for weakness, light-headedness, numbness and headaches.  Hematological: Negative for adenopathy. Does not bruise/bleed easily.  Psychiatric/Behavioral: Negative for  dysphoric mood. The patient is not nervous/anxious.         Objective:   Physical Exam  Constitutional: She appears well-developed and well-nourished. No distress.  obese and well appearing   HENT:  Mouth/Throat: Oropharynx is clear and moist.  Eyes: Conjunctivae and EOM are normal. Pupils are equal, round, and reactive to light. Right eye exhibits no discharge. Left eye exhibits no discharge. No scleral icterus.  Neck: Normal range of motion. Neck supple.  Cardiovascular: Normal rate, regular rhythm and normal heart sounds.   Pulmonary/Chest: Effort normal and breath sounds normal. No respiratory distress. She has no wheezes. She has no rales.  Abdominal: Soft. Bowel sounds are normal.  Musculoskeletal: She exhibits tenderness and deformity. She exhibits no edema.       Right hand: She exhibits tenderness and bony tenderness. She exhibits normal range of motion, normal two-point discrimination, normal capillary refill and no swelling. Normal sensation noted. Normal strength noted.  OA deformities noted in both hands   Tender over R 4th, 5th metacarpal bones  Pain worse with pronation/supination No swelling or erythema   Nl rom of hand/wrist   Limited rom LS -baseline  Lymphadenopathy:    She has no cervical adenopathy.  Neurological: She is alert. She has normal reflexes. She displays no atrophy. No cranial nerve deficit or sensory deficit.  She exhibits normal muscle tone. Coordination normal.  Today-nl sens to soft/ temp and sharp on all toes and feet and hands   Skin: Skin is warm and dry. No rash noted. No erythema. No pallor.  Psychiatric: She has a normal mood and affect.          Assessment & Plan:   Problem List Items Addressed This Visit      Endocrine   Hypothyroidism    Pt plans to continue seeing Dr Ronnald Collum for this and thyroid nodules        Musculoskeletal and Integument   DDD (degenerative disc disease), lumbar    Per pt relatively stable  Doubt this  is the cause of her numbness of all toes        Other   Diffuse connective tissue disease ([place])    Pt sees rheumatology (Dr Garen Grams) for this  ? If linked to her new neuropathy symptoms in feet  Also to her itchy skin condition  Not on any immunosuppressive meds currently      Obese    Discussed how this problem influences overall health and the risks it imposes  Reviewed plan for weight loss with lower calorie diet (via better food choices and also portion control or program like weight watchers) and exercise building up to or more than 30 minutes 5 days per week including some aerobic activity         Paresthesia of foot, bilateral    Distribution is concerning for peripheral neuropathy Lab today incl A1C and B12, cmet and esr She has mixed CT dz- likely linked?  Will consider neuro ref for NCV after labs return      Relevant Orders   Hemoglobin A1c   Vitamin B12   Comprehensive metabolic panel   Sedimentation Rate   Right hand pain    Dorsal medial R hand with tenderness over 4,5th metacarpals (no swelling) with pain on pro, supination  Xray today  Re assuring exam  Ice for now  Adv further with result      Relevant Orders   DG Hand Complete Right   Spinal stenosis - Primary    Lumbar Stable per pt Doubt causing all 10 toes to be numb

## 2016-08-12 NOTE — Assessment & Plan Note (Signed)
Distribution is concerning for peripheral neuropathy Lab today incl A1C and B12, cmet and esr She has mixed CT dz- likely linked?  Will consider neuro ref for NCV after labs return

## 2016-08-13 ENCOUNTER — Telehealth: Payer: Self-pay | Admitting: Family Medicine

## 2016-08-13 DIAGNOSIS — R202 Paresthesia of skin: Secondary | ICD-10-CM

## 2016-08-13 LAB — VITAMIN B12: Vitamin B-12: 517 pg/mL (ref 211–911)

## 2016-08-13 NOTE — Telephone Encounter (Signed)
Spoke with patient, and placed on [facility] WQ to see Dr Brett Fairy.

## 2016-08-13 NOTE — Telephone Encounter (Signed)
Referral done Will route to [facility]  

## 2016-08-13 NOTE — Telephone Encounter (Signed)
-----   Message from Morgan [name], [place] sent at 08/13/2016 11:37 AM EST ----- Pt notified of lab results and Dr. Marliss Coots comments. Pt agrees with referral to neuro. Pt said she wants to see Dr. Brett Fairy, please put referral in and I advise pt our [facility] will call to schedule appt

## 2016-09-18 ENCOUNTER — Encounter (INDEPENDENT_AMBULATORY_CARE_PROVIDER_SITE_OTHER): Payer: Self-pay

## 2016-09-18 ENCOUNTER — Encounter: Payer: Self-pay | Admitting: Neurology

## 2016-09-18 ENCOUNTER — Ambulatory Visit (INDEPENDENT_AMBULATORY_CARE_PROVIDER_SITE_OTHER): Payer: PPO | Admitting: Neurology

## 2016-09-18 VITALS — BP 112/64 | HR 62 | Resp 16 | Ht 65.0 in | Wt 185.0 lb

## 2016-09-18 DIAGNOSIS — G608 Other hereditary and idiopathic neuropathies: Secondary | ICD-10-CM

## 2016-09-18 NOTE — Progress Notes (Signed)
[facility]   Provider:  Larey [name], M D  Referring Provider: Abner Greenspan, MD Primary Care Physician:  Morgan Pardon, MD  Chief Complaint  Patient presents with  . New Patient (Initial Visit)    Rm 11. Patient reports that she started having numbness in her toes starting in 2007. Now has numbness in the bottom of her feet, coldness in toes, and now has coolness in her hands.      HPI:  Morgan [name] is a 71 y.o. female , seen here as a referral from Dr. Jerilynn Mages. [place] for a neuropathy work up. She is the spouse of an established patient and requested this referral.   Chief complaint according to patient :  Patient reports that she started having numbness in her toes starting in 2007. Now has numbness in the bottom of her feet, coldness in toes, and now has coolness in her hands.     [name] reports progressive numbness beginning about 10 years ago. It has affected the most peripheral body parts at first her toes. Since then it has progressed slightly but the numbness has become more profound. She does not experience pain she does not experience pin and needle dysesthesias for example. She has been checked for endocrine disorders and has  hypothyroidism , but not  Diabetes.  Nodular thyroid is followed endocrinology for the last 5 years.  When she was pregnant with her twin boys she also did not have neuropathic symptoms yet. 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  She has followed her rheumatologist, Dr. Estanislado Pandy.  She has undergone repeated antinuclear antibody testing, protein electrophoresis, and metabolic panels to check if any endocrine disorder had developed meanwhile. I have no doubt that she has a sensory neuropathy. Her hands have become cold , too.  She ost hair on her legs and toes. Her skin has become shiny . She is  sweating on either palm or soles.    Social history:  Married, twin sons  94  years old. , another 3 rd son 69  and a daughter 75  Review of Systems: Out of a complete 14 system review, the patient complains of only the following symptoms, and all other reviewed systems are negative.   Social History   Social History  . Marital status: Married    Spouse name: N/A  . Number of children: 4  . Years of education: HS   Occupational History  . Retired     Social History Main Topics  . Smoking status: Former Smoker    Types: Cigarettes    Quit date: 07/22/1965  . Smokeless tobacco: Never Used  . Alcohol use No  . Drug use: No  . Sexual activity: Yes    Birth control/ protection: Surgical     Comment: BTL   Other Topics Concern  . Not on file   Social History Narrative   + caffeine use      Family History  Problem Relation Age of Onset  . Coronary artery disease Father   . Heart attack Father     deceased at 77  . Dementia Mother   . Diabetes Maternal Grandmother   . Goiter Maternal Grandmother   . Diabetes Cousin   . Cancer Grandchild     rare type (dying at age 29)  . Colon cancer Neg Hx   . Esophageal cancer Neg Hx   . Stomach cancer Neg  Hx   . Rectal cancer Neg Hx     Past Medical History:  Diagnosis Date  . Allergy   . Bradycardia   . Connective tissue disease ([place])   . DDD (degenerative disc disease), cervical   . DDD (degenerative disc disease), lumbar   . Endometrial polyp    not resolved with D&C (following)  . GERD (gastroesophageal reflux disease)    gas/belching issues-was on protonix for short time-not since 6 months  . HLD (hyperlipidemia)   . Mixed connective tissue disease ([place]) diagnosed 5 years ago   no problems since diagnosis  . Multiple thyroid nodules 01/24/2014  . Obesity   . Osteoarthritis of cervical spine   . PONV (postoperative nausea and vomiting)   . Renal calculi   . Spinal stenosis     Past Surgical History:  Procedure Laterality Date  . APPENDECTOMY    . COLONOSCOPY    . DILATION AND CURETTAGE  OF UTERUS     fibroids  . DILATION AND CURETTAGE OF UTERUS  01/2005   endometrial polyp  . ENDOMETRIAL BIOPSY  2008   fibroid  . HAND SURGERY     carpal tunnel release bilateral Dr. Daylene Katayama  . hysteroscopic resection      polyps of PMB  . TONSILLECTOMY    . TUBAL LIGATION     1973  . UPPER GASTROINTESTINAL ENDOSCOPY      Current Outpatient Prescriptions  Medication Sig Dispense Refill  . Cholecalciferol (VITAMIN D-3 PO) Take 1,000 Units by mouth daily.     . Clobetasol Propionate Emulsion 0.05 % topical foam Apply 1 application topically 2 (two) times a week.     . estradiol (VIVELLE-DOT) 0.025 MG/24HR Place 1 patch onto the skin 2 (two) times a week. 24 patch 4  . fluticasone (FLONASE) 50 MCG/ACT nasal spray USE TWO SPRAY(S) IN EACH NOSTRIL ONCE DAILY 48 g 3  . ibuprofen (ADVIL,MOTRIN) 200 MG tablet Take 400 mg by mouth as needed.    . Lactobacillus Rhamnosus, GG, (CULTURELLE PO) Take 1 tablet by mouth daily. Culturelle    . levothyroxine (SYNTHROID, LEVOTHROID) 50 MCG tablet Take 50 mcg 3 times a week (Mon, Wed, Fri),25 mcg 4 times a week (Tues, [place], Sat, Sun)    . Multiple Vitamins-Minerals (HAIR/SKIN/NAILS PO) Take 1 tablet by mouth daily.     . Omega-3 Fatty Acids (FISH OIL) 1000 MG CAPS Take 1 capsule by mouth daily.    . polyethylene glycol (MIRALAX / GLYCOLAX) packet You may use this as needed for more acute constipation.  Follow label instructions. 14 each 0  . progesterone (PROMETRIUM) 100 MG capsule 1 tablet po daily 90 capsule 4  . valACYclovir (VALTREX) 1000 MG tablet TAKE ONE TABLET BY MOUTH ONCE DAILY AS NEEDED FOR FEVER BLISTERS 30 tablet 12   No current facility-administered medications for this visit.     Allergies as of 09/18/2016 - Review Complete 09/18/2016  Allergen Reaction Noted  . Codeine Nausea And Vomiting 01/19/2007  . Hydrocodone-acetaminophen  01/19/2007  . Penicillins Hives 01/19/2007    Vitals: BP 112/64   Pulse 62   Resp 16   Ht 5\' 5"   (1.651 m)   Wt 185 lb (83.9 kg)   LMP 07/22/1989 (Approximate)   BMI 30.79 kg/m  Last Weight:  Wt Readings from Last 1 Encounters:  09/18/16 185 lb (83.9 kg)   [id number] mass index is 30.79 kg/m.     Last Height:   Ht Readings from Last 1 Encounters:  09/18/16 5\' 5"  (1.651 m)    Physical exam:  General: The patient is awake, alert and appears not in acute distress. The patient is well groomed. Head: Normocephalic, atraumatic. Cardiovascular:  Regular rate and rhythm , without  murmurs or carotid bruit, and without distended neck veins. Respiratory: Lungs are clear to auscultation. Skin:  Without evidence of edema, or rash Trunk: BMI is 31 The patient's posture is erect   Neurologic exam : The patient is awake and alert, oriented to place and time.    MOCA:No flowsheet data found. MMSE: MMSE - Mini Mental State Exam 09/22/2015  Orientation to time 5  Orientation to Place 5  Registration 3  Attention/ Calculation 5  Recall 3  Language- name 2 objects 0  Language- repeat 1  Language- follow 3 step command 3  Language- read & follow direction 1  Write a sentence 0  Copy design 0  Total score 26   Attention span & concentration ability appears normal.  Speech is fluent, without dysarthria, dysphonia or aphasia.  Mood and affect are appropriate.  Cranial nerves: Pupils are equal and briskly reactive to light. Funduscopic exam withoutevidence of pallor or edema. Extraocular movements  in vertical and horizontal planes intact and without nystagmus. Visual fields by finger perimetry are intact. Hearing to finger rub intact. Facial sensation intact to fine touch.Facial motor strength is symmetric and tongue and uvula move midline. Shoulder shrug was symmetrical.   Motor exam: Normal tone, muscle bulk and symmetric strength in all extremities. Sensory:  Fine touch, pinprick and vibration were tested in all extremities. Proprioception tested in the upper extremities was  normal. Coordination: Rapid alternating movements in the fingers/hands was normal. Finger-to-nose maneuver  normal without evidence of ataxia, dysmetria or tremor. Gait and station: Patient walks without assistive device and is able unassisted to climb up to the exam table. Strength within normal limits.Stance is stable and normal.  Toe and heel stand were tested .Tandem gait is unfragmented. Turns with 3 Steps. Romberg testing is negative.  Deep tendon reflexes: in the upper and lower extremities are symmetric and intact. Babinski maneuver response is downgoing.  The patient was advised of the nature of the diagnosed sleep disorder , the treatment options and risks for general a health and wellness arising from not treating the condition.  I spent more than 30 minutes of face to face time with the patient. Greater than 50% of time was spent in counseling and coordination of care. We have discussed the diagnosis and differential and I answered the patient's questions.     Assessment:  After physical and neurologic examination, review of laboratory studies,  Personal review of imaging studies, reports of testing and pre-existing records as far as provided in visit., my assessment is   1) slow progression of a peripheral sensory  Neuropathy. There may be a slight dysautonomic component as she has lost hair growth on her legs , the skin is more shiny. She developed the first symptoms over 10 years ago , 5 years before she was diagnosed with hypothyroidism. She was diagnosed with an unspecified connective tissue disorder and has been followed by rheumatology since 2007. These neuropathies are small fiber related, and cannot be diagnosed by NCV,   Plan:  Treatment plan and additional workup :  I recommended a Vit B complex, Methylated B 12, probiotic, Vit D and Biotin.   If she should develop pain , we can treat with Lyrica or Gabapentin. PRN RV   Asencion Partridge Hayleen Clinkscales  MD  09/18/2016   CC: Morgan [name], [address], [place] [id number]

## 2016-09-18 NOTE — Patient Instructions (Signed)
Peripheral Neuropathy Peripheral neuropathy is a type of nerve damage. It affects nerves that carry signals between the spinal cord and other parts of the body. These are called peripheral nerves. With peripheral neuropathy, one nerve or a group of nerves may be damaged. What are the causes? Many things can damage peripheral nerves. For some people with peripheral neuropathy, the cause is unknown. Some causes include:  Diabetes. This is the most common cause of peripheral neuropathy.  Injury to a nerve.  Pressure or stress on a nerve that lasts a long time.  Too little vitamin B. Alcoholism can lead to this.  Infections.  Autoimmune diseases, such as multiple sclerosis and systemic lupus erythematosus.  Inherited nerve diseases.  Some medicines, such as cancer drugs.  Toxic substances, such as lead and mercury.  Too little blood flowing to the legs.  Kidney disease.  Thyroid disease.  What are the signs or symptoms? Different people have different symptoms. The symptoms you have will depend on which of your nerves is damaged. Common symptoms include:  Loss of feeling (numbness) in the feet and hands.  Tingling in the feet and hands.  Pain that burns.  Very sensitive skin.  Weakness.  Not being able to move a part of the body (paralysis).  Muscle twitching.  Clumsiness or poor coordination.  Loss of balance.  Not being able to control your bladder.  Feeling dizzy.  Sexual problems.  How is this diagnosed? Peripheral neuropathy is a symptom, not a disease. Finding the cause of peripheral neuropathy can be hard. To figure that out, your health care provider will take a medical history and do a physical exam. A neurological exam will also be done. This involves checking things affected by your brain, spinal cord, and nerves (nervous system). For example, your health care provider will check your reflexes, how you move, and what you can feel. Other types of tests  may also be ordered, such as:  Blood tests.  A test of the fluid in your spinal cord.  Imaging tests, such as CT scans or an MRI.  Electromyography (EMG). This test checks the nerves that control muscles.  Nerve conduction velocity tests. These tests check how fast messages pass through your nerves.  Nerve biopsy. A small piece of nerve is removed. It is then checked under a microscope.  How is this treated?  Medicine is often used to treat peripheral neuropathy. Medicines may include: ? Pain-relieving medicines. Prescription or over-the-counter medicine may be suggested. ? Antiseizure medicine. This may be used for pain. ? Antidepressants. These also may help ease pain from neuropathy. ? Lidocaine. This is a numbing medicine. You might wear a patch or be given a shot. ? Mexiletine. This medicine is typically used to help control irregular heart rhythms.  Surgery. Surgery may be needed to relieve pressure on a nerve or to destroy a nerve that is causing pain.  Physical therapy to help movement.  Assistive devices to help movement. Follow these instructions at home:  Only take over-the-counter or prescription medicines as directed by your health care provider. Follow the instructions carefully for any given medicines. Do not take any other medicines without first getting approval from your health care provider.  If you have diabetes, work closely with your health care provider to keep your blood sugar under control.  If you have numbness in your feet: ? Check every day for signs of injury or infection. Watch for redness, warmth, and swelling. ? Wear padded socks and comfortable   shoes. These help protect your feet.  Do not do things that put pressure on your damaged nerve.  Do not smoke. Smoking keeps blood from getting to damaged nerves.  Avoid or limit alcohol. Too much alcohol can cause a lack of B vitamins. These vitamins are needed for healthy nerves.  Develop a good  support system. Coping with peripheral neuropathy can be stressful. Talk to a mental health specialist or join a support group if you are struggling.  Follow up with your health care provider as directed. Contact a health care provider if:  You have new signs or symptoms of peripheral neuropathy.  You are struggling emotionally from dealing with peripheral neuropathy.  You have a fever. Get help right away if:  You have an injury or infection that is not healing.  You feel very dizzy or begin vomiting.  You have chest pain.  You have trouble breathing. This information is not intended to replace advice given to you by your health care provider. Make sure you discuss any questions you have with your health care provider. Document Released: 06/28/2002 Document Revised: 12/14/2015 Document Reviewed: 03/15/2013 Elsevier Interactive Patient Education  2017 Elsevier Inc.  

## 2016-11-22 ENCOUNTER — Ambulatory Visit (INDEPENDENT_AMBULATORY_CARE_PROVIDER_SITE_OTHER): Payer: PPO | Admitting: Family Medicine

## 2016-11-22 ENCOUNTER — Encounter: Payer: Self-pay | Admitting: Family Medicine

## 2016-11-22 ENCOUNTER — Ambulatory Visit (INDEPENDENT_AMBULATORY_CARE_PROVIDER_SITE_OTHER)
Admission: RE | Admit: 2016-11-22 | Discharge: 2016-11-22 | Disposition: A | Discharge status: Home / Self Care | Payer: PPO | Source: Ambulatory Visit | Attending: Family Medicine | Admitting: Family Medicine

## 2016-11-22 ENCOUNTER — Telehealth: Payer: Self-pay | Admitting: Family Medicine

## 2016-11-22 ENCOUNTER — Other Ambulatory Visit: Payer: Self-pay | Admitting: Family Medicine

## 2016-11-22 VITALS — BP 132/82 | HR 52 | Temp 98.6°F | Ht 65.0 in | Wt 181.8 lb

## 2016-11-22 DIAGNOSIS — M25561 Pain in right knee: Secondary | ICD-10-CM

## 2016-11-22 DIAGNOSIS — M1711 Unilateral primary osteoarthritis, right knee: Secondary | ICD-10-CM | POA: Diagnosis not present

## 2016-11-22 NOTE — Telephone Encounter (Signed)
Ref done  Will route to [facility] 

## 2016-11-22 NOTE — Patient Instructions (Signed)
Xray of knee today to see if arthritis or other abnormality If all is normal - we may consider ordering an ultrasound to look for a Baker's cyst  It is also possible your pain may come from your back   Use ice when it hurts-10 minutes at a time  We will make a plan when results return

## 2016-11-22 NOTE — Assessment & Plan Note (Signed)
Differential incl OA of knee/ other knee injury or Bakers cyst  Less likely radiculopathy from spinal stenosis  Xray today  Ice prn / elevate  Avoid activities that hurt  Plan to follow

## 2016-11-22 NOTE — Progress Notes (Signed)
Subjective:    Patient ID: Morgan [name], female    DOB: 12/31/1945, 71 y.o.   MRN: [id number]  HPI Here for R leg complaint - 2 weeks   Has had to take ibuprofen (takes it at night 2 pills)   71 yo female with hx of connective tissue dz and DJD and lumbar spinal stenosis   Pain in the back of her R leg - behind the knee  Hurst her "really bad" to walk  Has to lean on shopping cart at [employer]  Does not feel similar to arthritis pain (suspects she has oa in knees)   Pain quality- is not burning or sharp  Aching / dull pain  Worse when walking Better in bed and when getting up in the am    ? Last xrays  Knows she has oa in hands and feet and spine  Does NOT feel pain shooting down leg or any neuro changes in knee or upper leg  This does not feel like her sciatica    Seeing Dr Arnoldo Morale for her spinal stenosis  Just had MRI and may discuss surgery    Patient Active Problem List   Diagnosis Date Noted  . Posterior right knee pain 11/22/2016  . Paresthesia of foot, bilateral 08/12/2016  . Right hand pain 08/12/2016  . Encounter for Medicare annual wellness exam 04/13/2014  . Allergic rhinitis 04/13/2014  . Routine general medical examination at a health care facility 04/07/2014  . Abdominal distension 01/28/2014  . Multiple thyroid nodules 01/24/2014  . SBO (small bowel obstruction) ([place]) 01/20/2014  . Low back strain 01/07/2014  . DDD (degenerative disc disease), cervical   . DDD (degenerative disc disease), lumbar   . Spinal stenosis   . Obese   . TINNITUS 06/08/2010  . NONSPECIFIC ABNORMAL TOXICOLOGICAL FINDINGS 05/11/2010  . Hypothyroidism 04/18/2010  . BRADYCARDIA 05/31/2008  . GERD 05/03/2008  . DYSPEPSIA 05/03/2008  . DIVERTICULOSIS OF COLON 04/26/2008  . HEPATITIS, HX OF 04/26/2008  . COLONIC POLYPS, HYPERPLASTIC, HX OF 04/26/2008  . HEMORRHOIDS, INTERNAL 01/11/2008  . DERMATITIS, SEBORRHEIC NEC 01/28/2007  . Hyperlipidemia 01/19/2007  . IBS 01/19/2007   . OVERACTIVE BLADDER 01/19/2007  . POSTMENOPAUSAL STATUS 01/19/2007  . ECZEMA 01/19/2007  . Diffuse connective tissue disease ([place]) 01/19/2007  . OSTEOARTHRITIS 01/19/2007  . ALLERGY 01/19/2007   Past Medical History:  Diagnosis Date  . Allergy   . Bradycardia   . Connective tissue disease ([place])   . DDD (degenerative disc disease), cervical   . DDD (degenerative disc disease), lumbar   . Endometrial polyp    not resolved with D&C (following)  . GERD (gastroesophageal reflux disease)    gas/belching issues-was on protonix for short time-not since 6 months  . HLD (hyperlipidemia)   . Mixed connective tissue disease ([place]) diagnosed 5 years ago   no problems since diagnosis  . Multiple thyroid nodules 01/24/2014  . Obesity   . Osteoarthritis of cervical spine   . PONV (postoperative nausea and vomiting)   . Renal calculi   . Spinal stenosis    Past Surgical History:  Procedure Laterality Date  . APPENDECTOMY    . COLONOSCOPY    . DILATION AND CURETTAGE OF UTERUS     fibroids  . DILATION AND CURETTAGE OF UTERUS  01/2005   endometrial polyp  . ENDOMETRIAL BIOPSY  2008   fibroid  . HAND SURGERY     carpal tunnel release bilateral Dr. Daylene Katayama  . hysteroscopic resection  polyps of PMB  . TONSILLECTOMY    . TUBAL LIGATION     1973  . UPPER GASTROINTESTINAL ENDOSCOPY     Social History  Substance Use Topics  . Smoking status: Former Smoker    Types: Cigarettes    Quit date: 07/22/1965  . Smokeless tobacco: Never Used  . Alcohol use No   Family History  Problem Relation Age of Onset  . Coronary artery disease Father   . Heart attack Father     deceased at 65  . Dementia Mother   . Diabetes Maternal Grandmother   . Goiter Maternal Grandmother   . Diabetes Cousin   . Cancer Grandchild     rare type (dying at age 32)  . Colon cancer Neg Hx   . Esophageal cancer Neg Hx   . Stomach cancer Neg Hx   . Rectal cancer Neg Hx    Allergies  Allergen Reactions  .  Codeine Nausea And Vomiting  . Hydrocodone-Acetaminophen     REACTION: reaction not known  . Penicillins Hives   Current Outpatient Prescriptions on File Prior to Visit  Medication Sig Dispense Refill  . Clobetasol Propionate Emulsion 0.05 % topical foam Apply 1 application topically 2 (two) times a week.     . estradiol (VIVELLE-DOT) 0.025 MG/24HR Place 1 patch onto the skin 2 (two) times a week. 24 patch 4  . ibuprofen (ADVIL,MOTRIN) 200 MG tablet Take 400 mg by mouth as needed.    . Lactobacillus Rhamnosus, GG, (CULTURELLE PO) Take 1 tablet by mouth daily. Culturelle    . levothyroxine (SYNTHROID, LEVOTHROID) 50 MCG tablet Take 50 mcg 3 times a week (Mon, Wed, Fri),25 mcg 4 times a week (Tues, [place], Sat, Sun)    . polyethylene glycol (MIRALAX / GLYCOLAX) packet You may use this as needed for more acute constipation.  Follow label instructions. 14 each 0  . progesterone (PROMETRIUM) 100 MG capsule 1 tablet po daily 90 capsule 4  . valACYclovir (VALTREX) 1000 MG tablet TAKE ONE TABLET BY MOUTH ONCE DAILY AS NEEDED FOR FEVER BLISTERS 30 tablet 12   No current facility-administered medications on file prior to visit.      Review of Systems Review of Systems  Constitutional: Negative for fever, appetite change, fatigue and unexpected weight change.  Eyes: Negative for pain and visual disturbance.  Respiratory: Negative for cough and shortness of breath.   Cardiovascular: Negative for cp or palpitations    Gastrointestinal: Negative for nausea, diarrhea and constipation.  Genitourinary: Negative for urgency and frequency.  Skin: Negative for pallor or rash   MSK pos for low back pain / pos for pain of R leg behind knee  Neurological: Negative for weakness, light-headedness, numbness and headaches.  Hematological: Negative for adenopathy. Does not bruise/bleed easily.  Psychiatric/Behavioral: Negative for dysphoric mood. The patient is not nervous/anxious.         Objective:    Physical Exam  Constitutional: She appears well-developed and well-nourished. No distress.  HENT:  Head: Normocephalic and atraumatic.  Eyes: Conjunctivae and EOM are normal. Pupils are equal, round, and reactive to light.  Neck: Normal range of motion. Neck supple.  Cardiovascular: Normal rate, regular rhythm and normal heart sounds.   Pulmonary/Chest: Effort normal and breath sounds normal. No respiratory distress.  Abdominal: Soft. She exhibits no distension. There is no tenderness.  Musculoskeletal: She exhibits tenderness. She exhibits no edema or deformity.       Right knee: She exhibits normal range of motion, no  swelling, no effusion, no ecchymosis, no deformity, no erythema, normal alignment, no LCL laxity, normal patellar mobility, no bony tenderness, normal meniscus and no MCL laxity. Tenderness found. Lateral joint line tenderness noted.  R knee-tender posterior /mild No swelling or effusion  No varicosities No palp cords/warmth/ redness or skin change Neg Homans sign  Nl gait   Flex and ext - full with some discomfort Neg mcmurray test   Neurological: She displays no atrophy. No sensory deficit. She exhibits normal muscle tone.  Skin: Skin is warm and dry. No rash noted. No erythema.  Psychiatric: She has a normal mood and affect.          Assessment & Plan:   Problem List Items Addressed This Visit      Other   Posterior right knee pain    Differential incl OA of knee/ other knee injury or Bakers cyst  Less likely radiculopathy from spinal stenosis  Xray today  Ice prn / elevate  Avoid activities that hurt  Plan to follow

## 2016-11-22 NOTE — Progress Notes (Signed)
Pre visit review using our clinic review tool, if applicable. No additional management support is needed unless otherwise documented below in the visit note. 

## 2016-11-22 NOTE — Telephone Encounter (Signed)
-----   Message from Tammi Sou, [place] sent at 11/22/2016  3:38 PM EDT ----- Pt notified of xray results and Dr. Marliss Coots comments. Pt agrees with referral to Ortho, she wants to see someone in [place], please put referral in and I advise pt our [facility] will call to schedule appt

## 2016-11-27 ENCOUNTER — Telehealth: Payer: Self-pay | Admitting: Family Medicine

## 2016-11-27 DIAGNOSIS — Z0000 Encounter for general adult medical examination without abnormal findings: Secondary | ICD-10-CM

## 2016-11-27 NOTE — Telephone Encounter (Signed)
-----   Message from Eustace Pen, LPN sent at 12/23/8451  3:54 PM EDT ----- Regarding: Labs 5/16 Please place lab orders.   [employer]

## 2016-11-29 DIAGNOSIS — R030 Elevated blood-pressure reading, without diagnosis of hypertension: Secondary | ICD-10-CM | POA: Diagnosis not present

## 2016-11-29 DIAGNOSIS — M4317 Spondylolisthesis, lumbosacral region: Secondary | ICD-10-CM | POA: Diagnosis not present

## 2016-11-29 DIAGNOSIS — M4697 Unspecified inflammatory spondylopathy, lumbosacral region: Secondary | ICD-10-CM | POA: Diagnosis not present

## 2016-11-29 DIAGNOSIS — Z6829 Body mass index (BMI) 29.0-29.9, adult: Secondary | ICD-10-CM | POA: Diagnosis not present

## 2016-12-02 DIAGNOSIS — M1711 Unilateral primary osteoarthritis, right knee: Secondary | ICD-10-CM | POA: Diagnosis not present

## 2016-12-04 ENCOUNTER — Ambulatory Visit (INDEPENDENT_AMBULATORY_CARE_PROVIDER_SITE_OTHER): Payer: PPO

## 2016-12-04 VITALS — BP 130/80 | HR 52 | Temp 98.0°F | Ht 64.75 in | Wt 181.5 lb

## 2016-12-04 DIAGNOSIS — Z0000 Encounter for general adult medical examination without abnormal findings: Secondary | ICD-10-CM | POA: Diagnosis not present

## 2016-12-04 LAB — CBC WITH DIFFERENTIAL/PLATELET
BASOS PCT: 0.6 % (ref 0.0–3.0)
Basophils Absolute: 0 10*3/uL (ref 0.0–0.1)
EOS PCT: 2.2 % (ref 0.0–5.0)
Eosinophils Absolute: 0.1 10*3/uL (ref 0.0–0.7)
HCT: 40.2 % (ref 36.0–46.0)
Hemoglobin: 13.4 g/dL (ref 12.0–15.0)
LYMPHS ABS: 1.5 10*3/uL (ref 0.7–4.0)
Lymphocytes Relative: 29.2 % (ref 12.0–46.0)
MCHC: 33.4 g/dL (ref 30.0–36.0)
MCV: 90.8 fl (ref 78.0–100.0)
MONOS PCT: 14.9 % — AB (ref 3.0–12.0)
Monocytes Absolute: 0.8 10*3/uL (ref 0.1–1.0)
NEUTROS ABS: 2.7 10*3/uL (ref 1.4–7.7)
NEUTROS PCT: 53.1 % (ref 43.0–77.0)
PLATELETS: 217 10*3/uL (ref 150.0–400.0)
RBC: 4.43 Mil/uL (ref 3.87–5.11)
RDW: 13.6 % (ref 11.5–15.5)
WBC: 5.2 10*3/uL (ref 4.0–10.5)

## 2016-12-04 LAB — COMPREHENSIVE METABOLIC PANEL
ALK PHOS: 70 U/L (ref 39–117)
ALT: 18 U/L (ref 0–35)
AST: 20 U/L (ref 0–37)
Albumin: 4.4 g/dL (ref 3.5–5.2)
BUN: 17 mg/dL (ref 6–23)
CALCIUM: 9.5 mg/dL (ref 8.4–10.5)
CO2: 29 meq/L (ref 19–32)
Chloride: 105 mEq/L (ref 96–112)
Creatinine, Ser: 0.74 mg/dL (ref 0.40–1.20)
GFR: 82.39 mL/min (ref >60.00)
GLUCOSE: 84 mg/dL (ref 70–99)
POTASSIUM: 4.3 meq/L (ref 3.5–5.1)
Sodium: 139 mEq/L (ref 135–145)
Total Bilirubin: 0.6 mg/dL (ref 0.2–1.2)
Total Protein: 6.4 g/dL (ref 6.0–8.3)

## 2016-12-04 LAB — LIPID PANEL
CHOL/HDL RATIO: 5
Cholesterol: 224 mg/dL — ABNORMAL HIGH (ref 0–200)
HDL: 48 mg/dL (ref >39.00)
LDL Cholesterol: 149 mg/dL — ABNORMAL HIGH (ref 0–99)
NONHDL: 175.73
TRIGLYCERIDES: 134 mg/dL (ref 0.0–149.0)
VLDL: 26.8 mg/dL (ref 0.0–40.0)

## 2016-12-04 LAB — TSH: TSH: 0.73 u[IU]/mL (ref 0.35–4.50)

## 2016-12-04 NOTE — Progress Notes (Signed)
Pre visit review using our clinic review tool, if applicable. No additional management support is needed unless otherwise documented below in the visit note. 

## 2016-12-04 NOTE — Patient Instructions (Signed)
Morgan [name] , Thank you for taking time to come for your Medicare Wellness Visit. I appreciate your ongoing commitment to your health goals. Please review the following plan we discussed and let me know if I can assist you in the future.   These are the goals we discussed: Goals    . manage back pain          Starting 12/04/2016, I will continue to take pain medication and to use other pain relieving remedies as needed to manage back pain.                      This is a list of the screening recommended for you and due dates:  Health Maintenance  Topic Date Due  . Tetanus Vaccine  01/27/2017  . Flu Shot  02/19/2017  . Mammogram  04/03/2017  . Colon Cancer Screening  04/15/2017  . DEXA scan (bone density measurement)  Completed  .  Hepatitis C: One time screening is recommended by Center for Disease Control  (CDC) for  adults born from [birth year] through 1964.   Completed  . Pneumonia vaccines  Completed   Preventive Care for Adults  A healthy lifestyle and preventive care can promote health and wellness. Preventive health guidelines for adults include the following key practices.  . A routine yearly physical is a good way to check with your health care provider about your health and preventive screening. It is a chance to share any concerns and updates on your health and to receive a thorough exam.  . Visit your dentist for a routine exam and preventive care every 6 months. Brush your teeth twice a day and floss once a day. Good oral hygiene prevents tooth decay and gum disease.  . The frequency of eye exams is based on your age, health, family medical history, use  of contact lenses, and other factors. Follow your health care provider's ecommendations for frequency of eye exams.  . Eat a healthy diet. Foods like vegetables, fruits, whole grains, low-fat dairy products, and lean protein foods contain the nutrients you need without too many calories. Decrease your intake of foods high in  solid fats, added sugars, and salt. Eat the right amount of calories for you. Get information about a proper diet from your health care provider, if necessary.  . Regular physical exercise is one of the most important things you can do for your health. Most adults should get at least 150 minutes of moderate-intensity exercise (any activity that increases your heart rate and causes you to sweat) each week. In addition, most adults need muscle-strengthening exercises on 2 or more days a week.  Silver Sneakers may be a benefit available to you. To determine eligibility, you may visit the website: www.silversneakers.com or contact program at 684-284-7471 Mon-Fri between 8AM-8PM.   . Maintain a healthy weight. The body mass index (BMI) is a screening tool to identify possible weight problems. It provides an estimate of body fat based on height and weight. Your health care provider can find your BMI and can help you achieve or maintain a healthy weight.   For adults 20 years and older: ? A BMI below 18.5 is considered underweight. ? A BMI of 18.5 to 24.9 is normal. ? A BMI of 25 to 29.9 is considered overweight. ? A BMI of 30 and above is considered obese.   . Maintain normal blood lipids and cholesterol levels by exercising and minimizing your intake of saturated fat.  Eat a balanced diet with plenty of fruit and vegetables. Blood tests for lipids and cholesterol should begin at age 47 and be repeated every 5 years. If your lipid or cholesterol levels are high, you are over 50, or you are at high risk for heart disease, you may need your cholesterol levels checked more frequently. Ongoing high lipid and cholesterol levels should be treated with medicines if diet and exercise are not working.  . If you smoke, find out from your health care provider how to quit. If you do not use tobacco, please do not start.  . If you choose to drink alcohol, please do not consume more than 2 drinks per day. One drink  is considered to be 12 ounces (355 mL) of beer, 5 ounces (148 mL) of wine, or 1.5 ounces (44 mL) of liquor.  . If you are 31-59 years old, ask your health care provider if you should take aspirin to prevent strokes.  . Use sunscreen. Apply sunscreen liberally and repeatedly throughout the day. You should seek shade when your shadow is shorter than you. Protect yourself by wearing long sleeves, pants, a wide-brimmed hat, and sunglasses year round, whenever you are outdoors.  . Once a month, do a whole body skin exam, using a mirror to look at the skin on your back. Tell your health care provider of new moles, moles that have irregular borders, moles that are larger than a pencil eraser, or moles that have changed in shape or color.

## 2016-12-04 NOTE — Progress Notes (Signed)
Subjective:   Morgan [name] is a 71 y.o. female who presents for Medicare Annual (Subsequent) preventive examination.  Review of Systems:  N/A Cardiac Risk Factors include: advanced age (>69men, >31 women);obesity (BMI >30kg/m2);dyslipidemia     Objective:     Vitals: BP 130/80 (BP Location: Right Arm, Patient Position: Sitting, Cuff Size: Normal)   Pulse (!) 52   Temp 98 F (36.7 C) (Oral)   Ht 5' 4.75" (1.645 m) Comment: no shoes  Wt 181 lb 8 oz (82.3 kg)   LMP 07/22/1989 (Approximate)   SpO2 97%   BMI 30.44 kg/m   Body mass index is 30.44 kg/m.   Tobacco History  Smoking Status  . Former Smoker  . Types: Cigarettes  . Quit date: 07/22/1965  Smokeless Tobacco  . Never Used     Counseling given: No   Past Medical History:  Diagnosis Date  . Allergy   . Bradycardia   . Connective tissue disease ([place])   . DDD (degenerative disc disease), cervical   . DDD (degenerative disc disease), lumbar   . Endometrial polyp    not resolved with D&C (following)  . GERD (gastroesophageal reflux disease)    gas/belching issues-was on protonix for short time-not since 6 months  . HLD (hyperlipidemia)   . Mixed connective tissue disease ([place]) diagnosed 5 years ago   no problems since diagnosis  . Multiple thyroid nodules 01/24/2014  . Obesity   . Osteoarthritis of cervical spine   . PONV (postoperative nausea and vomiting)   . Renal calculi   . Spinal stenosis    Past Surgical History:  Procedure Laterality Date  . APPENDECTOMY    . COLONOSCOPY    . DILATION AND CURETTAGE OF UTERUS     fibroids  . DILATION AND CURETTAGE OF UTERUS  01/2005   endometrial polyp  . ENDOMETRIAL BIOPSY  2008   fibroid  . HAND SURGERY     carpal tunnel release bilateral Dr. Daylene Katayama  . hysteroscopic resection      polyps of PMB  . TONSILLECTOMY    . TUBAL LIGATION     1973  . UPPER GASTROINTESTINAL ENDOSCOPY     Family History  Problem Relation Age of Onset  . Coronary artery  disease Father   . Heart attack Father        deceased at 43  . Dementia Mother   . Diabetes Maternal Grandmother   . Goiter Maternal Grandmother   . Diabetes Cousin   . Cancer Grandchild        rare type (dying at age 52)  . Colon cancer Neg Hx   . Esophageal cancer Neg Hx   . Stomach cancer Neg Hx   . Rectal cancer Neg Hx    History  Sexual Activity  . Sexual activity: Yes  . Birth control/ protection: Surgical    Comment: BTL    Outpatient Encounter Prescriptions as of 12/04/2016  Medication Sig  . Clobetasol Propionate Emulsion 0.05 % topical foam Apply 1 application topically 2 (two) times a week.   . estradiol (VIVELLE-DOT) 0.025 MG/24HR Place 1 patch onto the skin 2 (two) times a week.  Marland Kitchen ibuprofen (ADVIL,MOTRIN) 200 MG tablet Take 400 mg by mouth as needed.  . Lactobacillus Rhamnosus, GG, (CULTURELLE PO) Take 1 tablet by mouth daily. Culturelle  . levothyroxine (SYNTHROID, LEVOTHROID) 50 MCG tablet Take 50 mcg 3 times a week (Mon, Wed, Fri),25 mcg 4 times a week (Tues, [place], Sat, Sun)  .  polyethylene glycol (MIRALAX / GLYCOLAX) packet You may use this as needed for more acute constipation.  Follow label instructions.  . Prenatal Vit-Fe Fumarate-FA (PRENATAL PO) Take 1 tablet by mouth daily.  . progesterone (PROMETRIUM) 100 MG capsule 1 tablet po daily  . valACYclovir (VALTREX) 1000 MG tablet TAKE ONE TABLET BY MOUTH ONCE DAILY AS NEEDED FOR FEVER BLISTERS   No facility-administered encounter medications on file as of 12/04/2016.     Activities of Daily Living In your present state of health, do you have any difficulty performing the following activities: 12/04/2016  Hearing? N  Vision? N  Difficulty concentrating or making decisions? N  Walking or climbing stairs? N  Dressing or bathing? N  Doing errands, shopping? N  Preparing Food and eating ? N  Using the Toilet? N  In the past six months, have you accidently leaked urine? N  Do you have problems with loss of  bowel control? N  Managing your Medications? N  Managing your Finances? N  Housekeeping or managing your Housekeeping? N  Some recent data might be hidden    Patient Care Team: Tower, Wynelle Fanny, MD as PCP - General Rolm Bookbinder, MD as Consulting Physician (Dermatology) Marybelle Killings, MD as Consulting Physician (Orthopedic Surgery) Kem Boroughs, [place] as Nurse Practitioner (Family Medicine) Lenard Simmer, MD as Attending Physician (Endocrinology) Bo Merino, MD as Consulting Physician (Rheumatology) Webb Laws, OD as Referring Physician (Optometry)    Assessment:     Hearing Screening   125Hz  250Hz  500Hz  1000Hz  2000Hz  3000Hz  4000Hz  6000Hz  8000Hz   Right ear:   40 40 40  40    Left ear:   40 40 40  40    Vision Screening Comments: Last vision exam with Dr. Einar Gip in Sept 2017   Exercise Activities and Dietary recommendations Current Exercise Habits: The patient does not participate in regular exercise at present, Exercise limited by: None identified  Goals    . manage back pain          Starting 12/04/2016, I will continue to take pain medication and to use other pain relieving remedies as needed to manage back pain.     . Reduce sugar intake to X grams per day          Starting 09/25/15, I will eat 2 svgs of sweet foods per week.       Fall Risk Fall Risk  12/04/2016 09/22/2015 04/13/2014  Falls in the past year? No No No   Depression Screen PHQ 2/9 Scores 12/04/2016 09/22/2015 04/13/2014  PHQ - 2 Score 0 0 0     Cognitive Function MMSE - Mini Mental State Exam 12/04/2016 09/22/2015  Orientation to time 5 5  Orientation to Place 5 5  Registration 3 3  Attention/ Calculation 0 5  Recall 3 3  Language- name 2 objects 0 0  Language- repeat 1 1  Language- follow 3 step command 3 3  Language- read & follow direction 0 1  Write a sentence 0 0  Copy design 0 0  Total score 20 26     PLEASE NOTE: A Mini-Cog screen was completed. Maximum score is 20. A value  of 0 denotes this part of [place] MMSE was not completed or the patient failed this part of the Mini-Cog screening.   Mini-Cog Screening Orientation to Time - Max 5 pts Orientation to Place - Max 5 pts Registration - Max 3 pts Recall - Max 3 pts Language Repeat - Max 1  pts Language Follow 3 Step Command - Max 3 pts     Immunization History  Administered Date(s) Administered  . Influenza,inj,Quad PF,36+ Mos 04/13/2014, 09/22/2015, 08/12/2016  . Pneumococcal Conjugate-13 04/13/2014  . Pneumococcal Polysaccharide-23 09/15/2012  . Td 11/07/1997, 01/28/2007  . Zoster 09/09/2006   Screening Tests Health Maintenance  Topic Date Due  . TETANUS/TDAP  01/27/2017  . INFLUENZA VACCINE  02/19/2017  . MAMMOGRAM  04/03/2017  . COLONOSCOPY  04/15/2017  . DEXA SCAN  Completed  . Hepatitis C Screening  Completed  . PNA vac Low Risk Adult  Completed      Plan:     I have personally reviewed and addressed the Medicare Annual Wellness questionnaire and have noted the following in the patient's chart:  A. Medical and social history B. Use of alcohol, tobacco or illicit drugs  C. Current medications and supplements D. Functional ability and status E.  Nutritional status F.  Physical activity G. Advance directives H. List of other physicians I.  Hospitalizations, surgeries, and ER visits in previous 12 months J.  [place] to include hearing, vision, cognitive, depression L. Referrals and appointments - none  In addition, I have reviewed and discussed with patient certain preventive protocols, quality metrics, and best practice recommendations. A written personalized care plan for preventive services as well as general preventive health recommendations were provided to patient.  See attached scanned questionnaire for additional information.   Signed,   Lindell Noe, MHA, BS, LPN Health Coach

## 2016-12-04 NOTE — Progress Notes (Signed)
PCP notes:   Health maintenance:  No gaps identified.   Abnormal screenings:   None  Patient concerns:   None  Nurse concerns:  None  Next PCP appt:   12/10/16 @ 1030  I reviewed health advisor's note, was available for consultation, and agree with documentation and plan. Loura Pardon MD

## 2016-12-10 ENCOUNTER — Ambulatory Visit (INDEPENDENT_AMBULATORY_CARE_PROVIDER_SITE_OTHER): Payer: PPO | Admitting: Family Medicine

## 2016-12-10 ENCOUNTER — Encounter: Payer: Self-pay | Admitting: Family Medicine

## 2016-12-10 VITALS — BP 128/72 | HR 66 | Temp 98.5°F | Ht 64.75 in | Wt 182.5 lb

## 2016-12-10 DIAGNOSIS — E039 Hypothyroidism, unspecified: Secondary | ICD-10-CM | POA: Diagnosis not present

## 2016-12-10 DIAGNOSIS — Z0000 Encounter for general adult medical examination without abnormal findings: Secondary | ICD-10-CM | POA: Diagnosis not present

## 2016-12-10 DIAGNOSIS — Z6830 Body mass index (BMI) 30.0-30.9, adult: Secondary | ICD-10-CM

## 2016-12-10 DIAGNOSIS — E6609 Other obesity due to excess calories: Secondary | ICD-10-CM

## 2016-12-10 DIAGNOSIS — M359 Systemic involvement of connective tissue, unspecified: Secondary | ICD-10-CM

## 2016-12-10 DIAGNOSIS — E7800 Pure hypercholesterolemia, unspecified: Secondary | ICD-10-CM

## 2016-12-10 NOTE — Assessment & Plan Note (Signed)
Disc goals for lipids and reasons to control them Rev labs with pt Rev low sat fat diet in detail Intolerant to statins  Stressed imp of diet

## 2016-12-10 NOTE — Progress Notes (Signed)
Subjective:    Patient ID: Morgan [name], female    DOB: June 21, 1946, 71 y.o.   MRN: [id number]  HPI Here for health maintenance exam and to review chronic medical problems    Had her amw visit 5/26 No care gaps or concerns  Wt Readings from Last 3 Encounters:  12/10/16 182 lb 8 oz (82.8 kg)  12/04/16 181 lb 8 oz (82.3 kg)  11/22/16 181 lb 12 oz (82.4 kg)  weight is stable  Eats well/healthy diet  Does not exercise for the sake of exercise -not interested in it (she dislikes exercise and refuses to do it as a rule)- voices understanding of this  Daughter enc yoga for her  bmi 30.6  Mammogram 9/17 normal Self breast exam- no breast lumps   Sees [facility] for gyn care  Did a breast exam the last time she went  Doing well with her prometrium -no bleeding   Colonoscopy 3/13- serrated adenoma found - ? If 3-5 year recall  No new bowel changes  She takes miralax when needed   dexa 3/14- bmd in the normal range  No falls or fractures  Does not take ca due to kidney stones  She had vit D checked at Dr Garen Grams (low nl in the past) Then she had a reaction to D plus krill oil - stopped it (thought she reacted to the krill oil)  zostavax 2/08 May be interested in the shingrix if it is covered by her insurance   Hypothyroidism  Pt has no clinical changes No change in energy level/ hair or skin/ edema and no tremor Lab Results  Component Value Date   TSH 0.73 12/04/2016    Hx of thyroid nodules Sees endocrinology  Has hx of diffuse connective tissue dz  Sees Dr Garen Grams Was being worked up for neuropathy - and was started on a pnv  Hx of hyperlipidemia Lab Results  Component Value Date   CHOL 224 (H) 12/04/2016   CHOL 198 09/20/2015   CHOL 217 (H) 04/08/2014   Lab Results  Component Value Date   HDL 48.00 12/04/2016   HDL 46.30 09/20/2015   HDL 45.00 04/08/2014   Lab Results  Component Value Date   LDLCALC 149 (H) 12/04/2016   LDLCALC 134 (H)  09/20/2015   LDLCALC 157 (H) 04/08/2014   Lab Results  Component Value Date   TRIG 134.0 12/04/2016   TRIG 86.0 09/20/2015   TRIG 75.0 04/08/2014   Lab Results  Component Value Date   CHOLHDL 5 12/04/2016   CHOLHDL 4 09/20/2015   CHOLHDL 5 04/08/2014   Lab Results  Component Value Date   LDLDIRECT 148.8 10/09/2010   LDLDIRECT 166.4 08/09/2010   LDLDIRECT 144.5 01/29/2008   is intolerant to statins   BP Readings from Last 3 Encounters:  12/10/16 128/72  12/04/16 130/80  11/22/16 132/82     Results for orders placed or performed in visit on 12/04/16  CBC with Differential/Platelet  Result Value Ref Range   WBC 5.2 4.0 - 10.5 K/uL   RBC 4.43 3.87 - 5.11 Mil/uL   Hemoglobin 13.4 12.0 - 15.0 g/dL   HCT 40.2 36.0 - 46.0 %   MCV 90.8 78.0 - 100.0 fl   MCHC 33.4 30.0 - 36.0 g/dL   RDW 13.6 11.5 - 15.5 %   Platelets 217.0 150.0 - 400.0 K/uL   Neutrophils Relative % 53.1 43.0 - 77.0 %   Lymphocytes Relative 29.2 12.0 - 46.0 %  Monocytes Relative 14.9 (H) 3.0 - 12.0 %   Eosinophils Relative 2.2 0.0 - 5.0 %   Basophils Relative 0.6 0.0 - 3.0 %   Neutro Abs 2.7 1.4 - 7.7 K/uL   Lymphs Abs 1.5 0.7 - 4.0 K/uL   Monocytes Absolute 0.8 0.1 - 1.0 K/uL   Eosinophils Absolute 0.1 0.0 - 0.7 K/uL   Basophils Absolute 0.0 0.0 - 0.1 K/uL  Comprehensive metabolic panel  Result Value Ref Range   Sodium 139 135 - 145 mEq/L   Potassium 4.3 3.5 - 5.1 mEq/L   Chloride 105 96 - 112 mEq/L   CO2 29 19 - 32 mEq/L   Glucose, Bld 84 70 - 99 mg/dL   BUN 17 6 - 23 mg/dL   Creatinine, Ser 0.74 0.40 - 1.20 mg/dL   Total Bilirubin 0.6 0.2 - 1.2 mg/dL   Alkaline Phosphatase 70 39 - 117 U/L   AST 20 0 - 37 U/L   ALT 18 0 - 35 U/L   Total Protein 6.4 6.0 - 8.3 g/dL   Albumin 4.4 3.5 - 5.2 g/dL   Calcium 9.5 8.4 - 10.5 mg/dL   GFR 82.39 >60.00 mL/min  Lipid panel  Result Value Ref Range   Cholesterol 224 (H) 0 - 200 mg/dL   Triglycerides 134.0 0.0 - 149.0 mg/dL   HDL 48.00 >39.00 mg/dL    VLDL 26.8 0.0 - 40.0 mg/dL   LDL Cholesterol 149 (H) 0 - 99 mg/dL   Total CHOL/HDL Ratio 5    NonHDL 175.73   TSH  Result Value Ref Range   TSH 0.73 0.35 - 4.50 uIU/mL    Patient Active Problem List   Diagnosis Date Noted  . Posterior right knee pain 11/22/2016  . Paresthesia of foot, bilateral 08/12/2016  . Right hand pain 08/12/2016  . Encounter for Medicare annual wellness exam 04/13/2014  . Allergic rhinitis 04/13/2014  . Routine general medical examination at a health care facility 04/07/2014  . Multiple thyroid nodules 01/24/2014  . History of small bowel obstruction 01/20/2014  . Low back strain 01/07/2014  . DDD (degenerative disc disease), cervical   . DDD (degenerative disc disease), lumbar   . Spinal stenosis   . Obese   . TINNITUS 06/08/2010  . NONSPECIFIC ABNORMAL TOXICOLOGICAL FINDINGS 05/11/2010  . Hypothyroidism 04/18/2010  . BRADYCARDIA 05/31/2008  . GERD 05/03/2008  . DYSPEPSIA 05/03/2008  . DIVERTICULOSIS OF COLON 04/26/2008  . HEPATITIS, HX OF 04/26/2008  . COLONIC POLYPS, HYPERPLASTIC, HX OF 04/26/2008  . HEMORRHOIDS, INTERNAL 01/11/2008  . DERMATITIS, SEBORRHEIC NEC 01/28/2007  . Hyperlipidemia 01/19/2007  . IBS 01/19/2007  . OVERACTIVE BLADDER 01/19/2007  . POSTMENOPAUSAL STATUS 01/19/2007  . ECZEMA 01/19/2007  . Diffuse connective tissue disease ([place]) 01/19/2007  . OSTEOARTHRITIS 01/19/2007  . ALLERGY 01/19/2007   Past Medical History:  Diagnosis Date  . Allergy   . Bradycardia   . Connective tissue disease ([place])   . DDD (degenerative disc disease), cervical   . DDD (degenerative disc disease), lumbar   . Endometrial polyp    not resolved with D&C (following)  . GERD (gastroesophageal reflux disease)    gas/belching issues-was on protonix for short time-not since 6 months  . HLD (hyperlipidemia)   . Mixed connective tissue disease ([place]) diagnosed 5 years ago   no problems since diagnosis  . Multiple thyroid nodules 01/24/2014  . Obesity    . Osteoarthritis of cervical spine   . PONV (postoperative nausea and vomiting)   . Renal  calculi   . Spinal stenosis    Past Surgical History:  Procedure Laterality Date  . APPENDECTOMY    . COLONOSCOPY    . DILATION AND CURETTAGE OF UTERUS     fibroids  . DILATION AND CURETTAGE OF UTERUS  01/2005   endometrial polyp  . ENDOMETRIAL BIOPSY  2008   fibroid  . HAND SURGERY     carpal tunnel release bilateral Dr. Daylene Katayama  . hysteroscopic resection      polyps of PMB  . TONSILLECTOMY    . TUBAL LIGATION     1973  . UPPER GASTROINTESTINAL ENDOSCOPY     Social History  Substance Use Topics  . Smoking status: Former Smoker    Types: Cigarettes    Quit date: 07/22/1965  . Smokeless tobacco: Never Used  . Alcohol use No   Family History  Problem Relation Age of Onset  . Coronary artery disease Father   . Heart attack Father        deceased at 57  . Dementia Mother   . Diabetes Maternal Grandmother   . Goiter Maternal Grandmother   . Diabetes Cousin   . Cancer Grandchild        rare type (dying at age 37)  . Colon cancer Neg Hx   . Esophageal cancer Neg Hx   . Stomach cancer Neg Hx   . Rectal cancer Neg Hx    Allergies  Allergen Reactions  . Codeine Nausea And Vomiting  . Hydrocodone-Acetaminophen     REACTION: reaction not known  . Penicillins Hives   Current Outpatient Prescriptions on File Prior to Visit  Medication Sig Dispense Refill  . Clobetasol Propionate Emulsion 0.05 % topical foam Apply 1 application topically 2 (two) times a week.     . estradiol (VIVELLE-DOT) 0.025 MG/24HR Place 1 patch onto the skin 2 (two) times a week. 24 patch 4  . ibuprofen (ADVIL,MOTRIN) 200 MG tablet Take 400 mg by mouth as needed.    . Lactobacillus Rhamnosus, GG, (CULTURELLE PO) Take 1 tablet by mouth daily. Culturelle    . levothyroxine (SYNTHROID, LEVOTHROID) 50 MCG tablet Take 50 mcg 3 times a week (Mon, Wed, Fri),25 mcg 4 times a week (Tues, [place], Sat, Sun)    .  polyethylene glycol (MIRALAX / GLYCOLAX) packet You may use this as needed for more acute constipation.  Follow label instructions. 14 each 0  . Prenatal Vit-Fe Fumarate-FA (PRENATAL PO) Take 1 tablet by mouth daily.    . progesterone (PROMETRIUM) 100 MG capsule 1 tablet po daily 90 capsule 4  . valACYclovir (VALTREX) 1000 MG tablet TAKE ONE TABLET BY MOUTH ONCE DAILY AS NEEDED FOR FEVER BLISTERS 30 tablet 12   No current facility-administered medications on file prior to visit.      Review of Systems    Review of Systems  Constitutional: Negative for fever, appetite change, fatigue and unexpected weight change.  Eyes: Negative for pain and visual disturbance.  Respiratory: Negative for cough and shortness of breath.   Cardiovascular: Negative for cp or palpitations    Gastrointestinal: Negative for nausea, diarrhea and constipation.  Genitourinary: Negative for urgency and frequency.  Skin: Negative for pallor or rash  pos for dry skin and sks that she dislikes  Neurological: Negative for weakness, light-headedness, numbness and headaches.  Hematological: Negative for adenopathy. Does not bruise/bleed easily.  Psychiatric/Behavioral: Negative for dysphoric mood. The patient is not nervous/anxious.      Objective:   Physical Exam  Constitutional:  She appears well-developed and well-nourished. No distress.  obese and well appearing   HENT:  Head: Normocephalic and atraumatic.  Right Ear: External ear normal.  Left Ear: External ear normal.  Mouth/Throat: Oropharynx is clear and moist.  Eyes: Conjunctivae and EOM are normal. Pupils are equal, round, and reactive to light. No scleral icterus.  Neck: Normal range of motion. Neck supple. No JVD present. Carotid bruit is not present. No thyromegaly present.  Cardiovascular: Normal rate, regular rhythm, normal heart sounds and intact distal pulses.  Exam reveals no gallop.   Pulmonary/Chest: Effort normal and breath sounds normal. No  respiratory distress. She has no wheezes. She exhibits no tenderness.  Abdominal: Soft. Bowel sounds are normal. She exhibits no distension, no abdominal bruit and no mass. There is no tenderness.  Musculoskeletal: Normal range of motion. She exhibits no edema or tenderness.  Lymphadenopathy:    She has no cervical adenopathy.  Neurological: She is alert. She has normal reflexes. No cranial nerve deficit. She exhibits normal muscle tone. Coordination normal.  Skin: Skin is warm and dry. No rash noted. No erythema. No pallor.  SKs diffusely  Some dry skin   Small AK on R upper arm  Some lentigines  Psychiatric: She has a normal mood and affect.          Assessment & Plan:   Problem List Items Addressed This Visit      Endocrine   Hypothyroidism    Hypothyroidism  Pt has no clinical changes No change in energy level/ hair or skin/ edema and no tremor Lab Results  Component Value Date   TSH 0.73 12/04/2016    Sees endocrinology for this         Other   Diffuse connective tissue disease ([place]) - Primary    Sees rheum  Not on any biologic agents Recommend low impact exercise-she declines       Hyperlipidemia    Disc goals for lipids and reasons to control them Rev labs with pt Rev low sat fat diet in detail Intolerant to statins  Stressed imp of diet       Obese    Discussed how this problem influences overall health and the risks it imposes  Reviewed plan for weight loss with lower calorie diet (via better food choices and also portion control or program like weight watchers) and exercise building up to or more than 30 minutes 5 days per week including some aerobic activity   Unfortunately pt dislikes exercise and refuses to do it      Routine general medical examination at a health care facility    Reviewed health habits including diet and exercise and skin cancer prevention Reviewed appropriate screening tests for age  Also reviewed health mt list, fam hx and  immunization status , as well as social and family history   See HPI Labs rev  amw rev  Disc imp of fitness to better health-she refuses to exercise  Enc wt loss  I recommend yoga if you change your mind about exercise   We will find out when you are due for your next colonoscopy (may be due)   Take vitamin D without krill oil - and take 1000 iu daily   If you are interested in the new shingles vaccine (Shingrix) - call your insurance to check on coverage

## 2016-12-10 NOTE — Assessment & Plan Note (Signed)
Reviewed health habits including diet and exercise and skin cancer prevention Reviewed appropriate screening tests for age  Also reviewed health mt list, fam hx and immunization status , as well as social and family history   See HPI Labs rev  amw rev  Disc imp of fitness to better health-she refuses to exercise  Enc wt loss  I recommend yoga if you change your mind about exercise   We will find out when you are due for your next colonoscopy (may be due)   Take vitamin D without krill oil - and take 1000 iu daily   If you are interested in the new shingles vaccine (Shingrix) - call your insurance to check on coverage

## 2016-12-10 NOTE — Assessment & Plan Note (Signed)
Discussed how this problem influences overall health and the risks it imposes  Reviewed plan for weight loss with lower calorie diet (via better food choices and also portion control or program like weight watchers) and exercise building up to or more than 30 minutes 5 days per week including some aerobic activity   Unfortunately pt dislikes exercise and refuses to do it

## 2016-12-10 NOTE — Patient Instructions (Addendum)
I recommend yoga if you change your mind about exercise   We will find out when you are due for your next colonoscopy   Take vitamin D without krill oil - and take 1000 iu daily   If you are interested in the new shingles vaccine (Shingrix) - call your insurance to check on coverage,( you should not get it within 1 month of other vaccines) , then call us for a prescription  for it to take to a pharmacy that gives the shot , or make a nurse visit to get it here depending on your coverage  For cholesterol :   Avoid red meat/ fried foods/ egg yolks/ fatty breakfast meats/ butter, cheese and high fat dairy/ and shellfish   Your cholesterol is going up - do the best you can with diet since you cannot take the statins

## 2016-12-10 NOTE — Assessment & Plan Note (Signed)
Sees rheum  Not on any biologic agents Recommend low impact exercise-she declines

## 2016-12-10 NOTE — Progress Notes (Signed)
Pre visit review using our clinic review tool, if applicable. No additional management support is needed unless otherwise documented below in the visit note. 

## 2016-12-10 NOTE — Assessment & Plan Note (Signed)
Hypothyroidism  Pt has no clinical changes No change in energy level/ hair or skin/ edema and no tremor Lab Results  Component Value Date   TSH 0.73 12/04/2016    Sees endocrinology for this

## 2017-01-09 DIAGNOSIS — E061 Subacute thyroiditis: Secondary | ICD-10-CM | POA: Diagnosis not present

## 2017-01-09 DIAGNOSIS — E039 Hypothyroidism, unspecified: Secondary | ICD-10-CM | POA: Diagnosis not present

## 2017-01-09 DIAGNOSIS — N959 Unspecified menopausal and perimenopausal disorder: Secondary | ICD-10-CM | POA: Diagnosis not present

## 2017-01-09 DIAGNOSIS — Q842 Other congenital malformations of hair: Secondary | ICD-10-CM | POA: Diagnosis not present

## 2017-01-09 DIAGNOSIS — E042 Nontoxic multinodular goiter: Secondary | ICD-10-CM | POA: Diagnosis not present

## 2017-01-10 DIAGNOSIS — E042 Nontoxic multinodular goiter: Secondary | ICD-10-CM | POA: Diagnosis not present

## 2017-01-10 DIAGNOSIS — E039 Hypothyroidism, unspecified: Secondary | ICD-10-CM | POA: Diagnosis not present

## 2017-01-16 DIAGNOSIS — E042 Nontoxic multinodular goiter: Secondary | ICD-10-CM | POA: Diagnosis not present

## 2017-01-16 DIAGNOSIS — E039 Hypothyroidism, unspecified: Secondary | ICD-10-CM | POA: Diagnosis not present

## 2017-01-16 DIAGNOSIS — N959 Unspecified menopausal and perimenopausal disorder: Secondary | ICD-10-CM | POA: Diagnosis not present

## 2017-01-16 DIAGNOSIS — E061 Subacute thyroiditis: Secondary | ICD-10-CM | POA: Diagnosis not present

## 2017-01-16 DIAGNOSIS — Q842 Other congenital malformations of hair: Secondary | ICD-10-CM | POA: Diagnosis not present

## 2017-03-11 DIAGNOSIS — R21 Rash and other nonspecific skin eruption: Secondary | ICD-10-CM | POA: Diagnosis not present

## 2017-03-11 DIAGNOSIS — Z85828 Personal history of other malignant neoplasm of skin: Secondary | ICD-10-CM | POA: Diagnosis not present

## 2017-04-09 DIAGNOSIS — Z1231 Encounter for screening mammogram for malignant neoplasm of breast: Secondary | ICD-10-CM | POA: Diagnosis not present

## 2017-04-10 ENCOUNTER — Encounter: Payer: Self-pay | Admitting: Family Medicine

## 2017-04-25 ENCOUNTER — Encounter: Payer: Self-pay | Admitting: Gastroenterology

## 2017-05-07 ENCOUNTER — Ambulatory Visit: Payer: PPO | Admitting: Certified Nurse Midwife

## 2017-05-08 ENCOUNTER — Other Ambulatory Visit: Payer: Self-pay | Admitting: *Deleted

## 2017-05-08 NOTE — Telephone Encounter (Signed)
Medication refill request: estradiol patch  Last AEX:  05/06/16 DL Next AEX: 05/28/17 DL Last MMG (if hormonal medication request): 04/09/17 [id number]:Benign Refill authorized: 05/06/16 #[id number]. Today please advise.

## 2017-05-09 MED ORDER — ESTRADIOL 0.025 MG/24HR TD PTTW: 1.0000 | MEDICATED_PATCH | TRANSDERMAL | 0 refills | Status: DC

## 2017-05-19 DIAGNOSIS — H5203 Hypermetropia, bilateral: Secondary | ICD-10-CM | POA: Diagnosis not present

## 2017-05-28 ENCOUNTER — Other Ambulatory Visit: Payer: Self-pay

## 2017-05-28 ENCOUNTER — Encounter: Payer: Self-pay | Admitting: Certified Nurse Midwife

## 2017-05-28 ENCOUNTER — Other Ambulatory Visit (HOSPITAL_COMMUNITY)
Admission: RE | Admit: 2017-05-28 | Discharge: 2017-05-28 | Disposition: A | Discharge status: Home / Self Care | Payer: PPO | Source: Ambulatory Visit | Attending: Obstetrics & Gynecology | Admitting: Obstetrics & Gynecology

## 2017-05-28 ENCOUNTER — Ambulatory Visit (INDEPENDENT_AMBULATORY_CARE_PROVIDER_SITE_OTHER): Payer: PPO | Admitting: Certified Nurse Midwife

## 2017-05-28 VITALS — BP 118/70 | HR 64 | Resp 16 | Ht 64.75 in | Wt 178.0 lb

## 2017-05-28 DIAGNOSIS — Z23 Encounter for immunization: Secondary | ICD-10-CM | POA: Diagnosis not present

## 2017-05-28 DIAGNOSIS — B009 Herpesviral infection, unspecified: Secondary | ICD-10-CM | POA: Diagnosis not present

## 2017-05-28 DIAGNOSIS — Z01419 Encounter for gynecological examination (general) (routine) without abnormal findings: Secondary | ICD-10-CM

## 2017-05-28 DIAGNOSIS — Z124 Encounter for screening for malignant neoplasm of cervix: Secondary | ICD-10-CM

## 2017-05-28 MED ORDER — VALACYCLOVIR HCL 1 G PO TABS: ORAL_TABLET | ORAL | 12 refills | Status: DC

## 2017-05-28 MED ORDER — TETANUS-DIPHTH-ACELL PERTUSSIS 5-2.5-18.5 LF-MCG/0.5 IM SUSP
0.5000 mL | Freq: Once | INTRAMUSCULAR | Status: AC
  Administered 2017-05-28: 0.5 mL via INTRAMUSCULAR

## 2017-05-28 NOTE — Patient Instructions (Signed)

## 2017-05-28 NOTE — Progress Notes (Signed)
71 y.o. [id number] Married  Caucasian Fe here for annual exam.  Menopausal no HRT. Denies vaginal dryness or vaginal bleeding since last visit. Sees Dr Glori Bickers yearly for aex/labs. Sees Endocrine for Synthroid management with history of thyroid nodules. 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 Continues to use Vivelle Dot and Prometrium use for quality of life. BMD was normal with PCP. She has tried to discontinue HRT and has hot flashes  And is very uncomfortable. Aware of concern with use. Does not need refill at present. No health issues today.   Patient's last menstrual period was 07/22/1989 (approximate).          Sexually active: Yes.    The current method of family planning is post menopausal status.    Exercising: No.  The patient does not participate in regular exercise at present. Smoker:  no  Health Maintenance: Pap:  10-01-10 neg History of Abnormal Pap: no MMG:  04-09-17 category a density birads 2:neg Self Breast exams: yes Colonoscopy:  2013 f/u 75yrs BMD:   2014 normal TDaP:  2008 Shingles: 2008 Pneumonia: 2014, 2015 Hep C and HIV: Hep c neg 2017 Labs: PCP   reports that she quit smoking about 51 years ago. Her smoking use included cigarettes. she has never used smokeless tobacco. She reports that she does not drink alcohol or use drugs.  Past Medical History:  Diagnosis Date  . Allergy   . Bradycardia   . Connective tissue disease ([place])   . DDD (degenerative disc disease), cervical   . DDD (degenerative disc disease), lumbar   . Endometrial polyp    not resolved with D&C (following)  . GERD (gastroesophageal reflux disease)    gas/belching issues-was on protonix for short time-not since 6 months  . HLD (hyperlipidemia)   . Mixed connective tissue disease ([place]) diagnosed 5 years ago   no problems since diagnosis  . Multiple thyroid nodules 01/24/2014  . Obesity   . Osteoarthritis of cervical spine   . PONV (postoperative nausea and vomiting)   .  Renal calculi   . Spinal stenosis     Past Surgical History:  Procedure Laterality Date  . APPENDECTOMY    . COLONOSCOPY    . DILATION AND CURETTAGE OF UTERUS     fibroids  . DILATION AND CURETTAGE OF UTERUS  01/2005   endometrial polyp  . ENDOMETRIAL BIOPSY  2008   fibroid  . HAND SURGERY     carpal tunnel release bilateral Dr. Daylene Katayama  . hysteroscopic resection      polyps of PMB  . TONSILLECTOMY    . TUBAL LIGATION     1973  . UPPER GASTROINTESTINAL ENDOSCOPY      Current Outpatient Medications  Medication Sig Dispense Refill  . Clobetasol Propionate Emulsion 0.05 % topical foam Apply 1 application topically 2 (two) times a week.     . estradiol (VIVELLE-DOT) 0.025 MG/24HR Place 1 patch onto the skin 2 (two) times a week. 24 patch 0  . ibuprofen (ADVIL,MOTRIN) 200 MG tablet Take 400 mg by mouth as needed.    . Lactobacillus Rhamnosus, GG, (CULTURELLE PO) Take 1 tablet by mouth daily. Culturelle    . levothyroxine (SYNTHROID, LEVOTHROID) 50 MCG tablet Take 50 mcg 3 times a week (Mon, Wed, Fri),25 mcg 4 times a week (Tues, [place], Sat, Sun)    . polyethylene glycol (MIRALAX / GLYCOLAX) packet You may use this as needed for more acute constipation.  Follow label instructions.  14 each 0  . Prenatal Vit-Fe Fumarate-FA (PRENATAL PO) Take 1 tablet by mouth daily.    . progesterone (PROMETRIUM) 100 MG capsule 1 tablet po daily 90 capsule 4  . valACYclovir (VALTREX) 1000 MG tablet TAKE ONE TABLET BY MOUTH ONCE DAILY AS NEEDED FOR FEVER BLISTERS 30 tablet 12   No current facility-administered medications for this visit.     Family History  Problem Relation Age of Onset  . Coronary artery disease Father   . Heart attack Father        deceased at 106  . Dementia Mother   . Diabetes Maternal Grandmother   . Goiter Maternal Grandmother   . Diabetes Cousin   . Cancer Grandchild        rare type (dying at age 72)  . Colon cancer Neg Hx   . Esophageal cancer Neg Hx   . Stomach  cancer Neg Hx   . Rectal cancer Neg Hx     ROS:  Pertinent items are noted in HPI.  Otherwise, a comprehensive ROS was negative.  Exam:   LMP 07/22/1989 (Approximate)    Ht Readings from Last 3 Encounters:  12/10/16 5' 4.75" (1.645 m)  12/04/16 5' 4.75" (1.645 m)  11/22/16 5\' 5"  (1.651 m)    General appearance: alert, cooperative and appears stated age Head: Normocephalic, without obvious abnormality, atraumatic Neck: no adenopathy, supple, symmetrical, trachea midline and thyroid normal to inspection and palpation Lungs: clear to auscultation bilaterally Breasts: normal appearance, no masses or tenderness, No nipple retraction or dimpling, No nipple discharge or bleeding, No axillary or supraclavicular adenopathy Heart: regular rate and rhythm Abdomen: soft, non-tender; no masses,  no organomegaly Extremities: extremities normal, atraumatic, no cyanosis or edema Skin: Skin color, texture, turgor normal. No rashes or lesions Lymph nodes: Cervical, supraclavicular, and axillary nodes normal. No abnormal inguinal nodes palpated Neurologic: Grossly normal   Pelvic: External genitalia:  no lesions              Urethra:  normal appearing urethra with no masses, tenderness or lesions              Bartholin's and Skene's: normal                 Vagina: normal appearing vagina with normal color and discharge, no lesions              Cervix: no bleeding following Pap, no cervical motion tenderness and no lesions              Pap taken: Yes.   Bimanual Exam:  Uterus:  normal size, contour, position, consistency, mobility, non-tender              Adnexa: normal adnexa and no mass, fullness, tenderness               Rectovaginal: Confirms               Anus:  normal sphincter tone, no lesions  Chaperone present: yes  A:  Well Woman with normal exam  Menopausal with HRT.  Hypothyroid with Endocrine management  PCP for aex and labs yearly  Immunization update  HIstory of HSV 1 Valtrex  working well  P:   Reviewed health and wellness pertinent to exam  Discussed risks/benefits/warning signs and current recommendation for HRT use for menopausal symptoms during the menopausal period. Also discussed should be discontinued by 60 to 61 due to concerns with cardiovascular changes and increase risk of breast cancer  with Estrogen and progesterone use. Questions addressed. Patient does not want to stop HRT. She feels it improves quality of life. Discussed decreasing dose and trying to stop. Patient will consider. Rx no renewed at this point."I have plenty". Will try to cut vivelle dot in half work with this and advise.  Continue follow up with MD as indicated.  Requests TDAP  Rx Valtrex see order with instructions  Pap smear: yes   counseled on breast self exam, mammography screening, feminine hygiene, adequate intake of calcium and vitamin D, diet and exercise, Kegel's exercises  return annually or prn  An After Visit Summary was printed and given to the patient.

## 2017-05-29 ENCOUNTER — Telehealth: Payer: Self-pay | Admitting: *Deleted

## 2017-05-29 DIAGNOSIS — B009 Herpesviral infection, unspecified: Secondary | ICD-10-CM

## 2017-05-29 NOTE — Telephone Encounter (Signed)
Routing to T. Alroy Dust, CMA for follow up.

## 2017-05-29 NOTE — Telephone Encounter (Signed)
Call to patient's home number, spoke with patient's husband, Ronalee Belts, okay per DPR. Julio Sicks to have patient return call to [place].

## 2017-05-29 NOTE — Telephone Encounter (Signed)
Call to [facility] Spoke with Missy, in regards to form from their pharmacy stating prescription for valtrex had been deleted. Missy states the signature did not match what was previously sent in for prescription.   After review with Johny Shock, CNM- Debbi states prescription directions were written correctly for treatment of fever blisters and to clarify with patient how she was taking medication.

## 2017-05-29 NOTE — Telephone Encounter (Signed)
Spoke with Colletta [place] at [facility], RN advised per provider, pf the need to clarify how patient was taking valtrex prescription prior to resending prescription.

## 2017-05-30 LAB — CYTOLOGY - PAP: DIAGNOSIS: NEGATIVE

## 2017-05-30 NOTE — Telephone Encounter (Signed)
Patient returning Emily's call. °

## 2017-05-30 NOTE — Telephone Encounter (Signed)
Patient returning call.

## 2017-05-30 NOTE — Telephone Encounter (Signed)
Patient is returning a call to Emily. °

## 2017-05-30 NOTE — Telephone Encounter (Signed)
Tried calling patient, Message left for patient to return my call.

## 2017-06-03 NOTE — Addendum Note (Signed)
Addended by: Regina Eck on: 06/03/2017 01:02 PM   Modules accepted: Orders

## 2017-06-03 NOTE — Telephone Encounter (Signed)
Call to patient. RN advised [place] asking for clarification on how patient takes valtrex prescription. Patient states that she only takes valtrex when she has a fever blister outbreak. Patient states with outbreak, she takes 2 tablets the first day, and then one tablet each day after until she knows the blister "won't erupt again." RN advised [facility] had placed a temporary hold on prescription until our office returned call for clarification. Patient verbalized understanding.   Routing to provider for review.

## 2017-06-05 MED ORDER — VALACYCLOVIR HCL 1 G PO TABS: ORAL_TABLET | ORAL | 6 refills | Status: DC

## 2017-06-05 NOTE — Telephone Encounter (Signed)
After review with Melvia Heaps, CNM, prescription should be 2 grams daily for 1 day for treatment of fever blisters. New prescription for valtrex 1,000mg  #30, 6 RF sent in to pharmacy on file per Johny Shock, CNM.

## 2017-06-05 NOTE — Telephone Encounter (Signed)
Spoke with [place] at [facility]. RN advised valtrex prescription had been resent and needed to be filled as prescribed by Melvia Heaps, CNM. Verbalized understanding.   Routing to provider for final review. Patient agreeable to disposition. Will close encounter.

## 2017-06-16 ENCOUNTER — Other Ambulatory Visit: Payer: Self-pay | Admitting: *Deleted

## 2017-06-16 MED ORDER — PROGESTERONE MICRONIZED 100 MG PO CAPS: ORAL_CAPSULE | ORAL | 0 refills | Status: DC

## 2017-06-16 NOTE — Telephone Encounter (Signed)
Medication refill request: Prometrium  Last AEX:  05/28/17 DL  Next AEX: none scheduled  Last MMG (if hormonal medication request): 04/09/17 BIRADS 2 benign  Refill authorized: 05/06/16 #[id number], 4RF. Today, please advise.

## 2017-06-18 ENCOUNTER — Telehealth: Payer: Self-pay | Admitting: Rheumatology

## 2017-06-18 DIAGNOSIS — M2550 Pain in unspecified joint: Secondary | ICD-10-CM

## 2017-06-18 DIAGNOSIS — Z79899 Other long term (current) drug therapy: Secondary | ICD-10-CM

## 2017-06-18 DIAGNOSIS — R300 Dysuria: Secondary | ICD-10-CM

## 2017-06-18 NOTE — Telephone Encounter (Signed)
Please include the Vitamin D on her lab work order per patient's request.  Thank you.

## 2017-06-18 NOTE — Telephone Encounter (Signed)
Patient called to make her yearly appointment with Dr. Estanislado Pandy.  I made her appointment for Monday, May 6th @ 11:15.  Patient requested her lab sheet to be mailed to her so that she can get lab work done before her appointment.  CB#[phone].  Thank you.

## 2017-06-19 DIAGNOSIS — Z780 Asymptomatic menopausal state: Secondary | ICD-10-CM | POA: Diagnosis not present

## 2017-06-27 NOTE — Telephone Encounter (Signed)
Labs mailed to patient.

## 2017-06-27 NOTE — Telephone Encounter (Signed)
Patient has an appointment scheduled for 11/24/17 and would like to have labs done before her appointment. Patient is seen for OA hands and OA feet as well as Lichenoid Dermatitis. Please advise what labs we should order.

## 2017-06-27 NOTE — Addendum Note (Signed)
Addended by: Carole Binning on: 06/27/2017 03:55 PM   Modules accepted: Orders

## 2017-06-27 NOTE — Telephone Encounter (Signed)
CBC, CMP, UA, ESR, ANA, ENA, C3, C4

## 2017-07-01 ENCOUNTER — Telehealth: Payer: Self-pay

## 2017-07-01 NOTE — Telephone Encounter (Signed)
BMD results are normal & needs to be repeated in 42yrs. Detailed results left on voicemail. DPR signed [phone]

## 2017-08-06 ENCOUNTER — Encounter: Payer: Self-pay | Admitting: Family Medicine

## 2017-08-06 ENCOUNTER — Ambulatory Visit (INDEPENDENT_AMBULATORY_CARE_PROVIDER_SITE_OTHER): Payer: PPO | Admitting: Family Medicine

## 2017-08-06 VITALS — BP 110/66 | HR 58 | Temp 97.8°F | Ht 64.75 in | Wt 180.2 lb

## 2017-08-06 DIAGNOSIS — R0981 Nasal congestion: Secondary | ICD-10-CM | POA: Diagnosis not present

## 2017-08-06 DIAGNOSIS — R0683 Snoring: Secondary | ICD-10-CM

## 2017-08-06 DIAGNOSIS — M359 Systemic involvement of connective tissue, unspecified: Secondary | ICD-10-CM | POA: Diagnosis not present

## 2017-08-06 DIAGNOSIS — Z23 Encounter for immunization: Secondary | ICD-10-CM | POA: Diagnosis not present

## 2017-08-06 NOTE — Progress Notes (Signed)
Subjective:    Patient ID: Morgan [name], female    DOB: February 18, 1946, 72 y.o.   MRN: [id number]  HPI  Here for snoring   Going on for quite some time  Her husband had to move out of the bedroom (before that he had to wake her up and make her roll over)  She has snored for years - getting worse lately   No witnessed apnea  She is not very tired during the day  Feels rested when she wakes up  Sleeps well  Does not wake up with headaches   She does have some nasal congestion at night (worse lying down)  Mouth breathing makes it worse  She has tried flonase   Snores less on R side Snores more on L side  She tends to flip back and forth  Does not sleep on her back/always her back    Also wants a flu shot today   Wt Readings from Last 3 Encounters:  08/06/17 180 lb 4 oz (81.8 kg)  05/28/17 178 lb (80.7 kg)  12/10/16 182 lb 8 oz (82.8 kg)  wt tends to stay about the same  30.23 kg/m   Former smoker  Pulse is 58  Pulse ox 97% BP Readings from Last 3 Encounters:  08/06/17 110/66  05/28/17 118/70  12/10/16 128/72   Patient Active Problem List   Diagnosis Date Noted  . Snoring 08/06/2017  . Nasal congestion 08/06/2017  . Posterior right knee pain 11/22/2016  . Paresthesia of foot, bilateral 08/12/2016  . Right hand pain 08/12/2016  . Encounter for Medicare annual wellness exam 04/13/2014  . Allergic rhinitis 04/13/2014  . Routine general medical examination at a health care facility 04/07/2014  . Multiple thyroid nodules 01/24/2014  . History of small bowel obstruction 01/20/2014  . Low back strain 01/07/2014  . DDD (degenerative disc disease), cervical   . DDD (degenerative disc disease), lumbar   . Spinal stenosis   . Obese   . TINNITUS 06/08/2010  . NONSPECIFIC ABNORMAL TOXICOLOGICAL FINDINGS 05/11/2010  . Hypothyroidism 04/18/2010  . BRADYCARDIA 05/31/2008  . GERD 05/03/2008  . DYSPEPSIA 05/03/2008  . DIVERTICULOSIS OF COLON 04/26/2008  . HEPATITIS,  HX OF 04/26/2008  . COLONIC POLYPS, HYPERPLASTIC, HX OF 04/26/2008  . HEMORRHOIDS, INTERNAL 01/11/2008  . DERMATITIS, SEBORRHEIC NEC 01/28/2007  . Hyperlipidemia 01/19/2007  . IBS 01/19/2007  . OVERACTIVE BLADDER 01/19/2007  . POSTMENOPAUSAL STATUS 01/19/2007  . ECZEMA 01/19/2007  . Diffuse connective tissue disease ([place]) 01/19/2007  . OSTEOARTHRITIS 01/19/2007  . ALLERGY 01/19/2007   Past Medical History:  Diagnosis Date  . Allergy   . Bradycardia   . Connective tissue disease ([place])   . DDD (degenerative disc disease), cervical   . DDD (degenerative disc disease), lumbar   . Endometrial polyp    not resolved with D&C (following)  . GERD (gastroesophageal reflux disease)    gas/belching issues-was on protonix for short time-not since 6 months  . HLD (hyperlipidemia)   . Mixed connective tissue disease ([place]) diagnosed 5 years ago   no problems since diagnosis  . Multiple thyroid nodules 01/24/2014  . Obesity   . Osteoarthritis of cervical spine   . PONV (postoperative nausea and vomiting)   . Renal calculi   . Spinal stenosis    Past Surgical History:  Procedure Laterality Date  . APPENDECTOMY    . COLONOSCOPY    . DILATION AND CURETTAGE OF UTERUS     fibroids  . DILATION AND  CURETTAGE OF UTERUS  01/2005   endometrial polyp  . ENDOMETRIAL BIOPSY  2008   fibroid  . HAND SURGERY     carpal tunnel release bilateral Dr. Daylene Katayama  . hysteroscopic resection      polyps of PMB  . TONSILLECTOMY    . TUBAL LIGATION     1973  . UPPER GASTROINTESTINAL ENDOSCOPY     Social History   Tobacco Use  . Smoking status: Former Smoker    Types: Cigarettes    Last attempt to quit: 07/22/1965    Years since quitting: 52.0  . Smokeless tobacco: Never Used  Substance Use Topics  . Alcohol use: No  . Drug use: No   Family History  Problem Relation Age of Onset  . Coronary artery disease Father   . Heart attack Father        deceased at 83  . Dementia Mother   . Diabetes Maternal  Grandmother   . Goiter Maternal Grandmother   . Diabetes Cousin   . Cancer Grandchild        rare type (dying at age 26)  . Colon cancer Neg Hx   . Esophageal cancer Neg Hx   . Stomach cancer Neg Hx   . Rectal cancer Neg Hx    Allergies  Allergen Reactions  . Codeine Nausea And Vomiting  . Hydrocodone-Acetaminophen     REACTION: reaction not known  . Penicillins Hives   Current Outpatient Medications on File Prior to Visit  Medication Sig Dispense Refill  . Cholecalciferol (VITAMIN D-3) 1000 units CAPS Take daily by mouth.    . Clobetasol Propionate Emulsion 0.05 % topical foam Apply 1 application topically 2 (two) times a week.     . estradiol (VIVELLE-DOT) 0.025 MG/24HR Place 1 patch onto the skin 2 (two) times a week. 24 patch 0  . ibuprofen (ADVIL,MOTRIN) 200 MG tablet Take 400 mg by mouth as needed.    . Lactobacillus Rhamnosus, GG, (CULTURELLE PO) Take 1 tablet by mouth daily. Culturelle    . levothyroxine (SYNTHROID, LEVOTHROID) 50 MCG tablet Take 50 mcg 3 times a week (Mon, Wed, Fri),25 mcg 4 times a week (Tues, [place], Sat, Sun)    . polyethylene glycol (MIRALAX / GLYCOLAX) packet You may use this as needed for more acute constipation.  Follow label instructions. 14 each 0  . Prenatal Vit-Fe Fumarate-FA (PRENATAL PO) Take 1 tablet by mouth daily.    . progesterone (PROMETRIUM) 100 MG capsule 1 tablet po daily 90 capsule 0  . valACYclovir (VALTREX) 1000 MG tablet 2 tablets twice daily for 1 day for fever blisters 30 tablet 6   No current facility-administered medications on file prior to visit.     Review of Systems  Constitutional: Negative for activity change, appetite change, fatigue, fever and unexpected weight change.  HENT: Positive for congestion. Negative for postnasal drip, rhinorrhea, sore throat and trouble swallowing.        Pos for loud snoring   Eyes: Negative for pain, redness, itching and visual disturbance.  Respiratory: Negative for cough, chest  tightness, shortness of breath and wheezing.   Cardiovascular: Negative for chest pain and palpitations.  Gastrointestinal: Negative for abdominal pain, blood in stool, constipation, diarrhea and nausea.  Endocrine: Negative for cold intolerance, heat intolerance, polydipsia and polyuria.  Genitourinary: Negative for difficulty urinating, dysuria, frequency and urgency.  Musculoskeletal: Negative for arthralgias, joint swelling and myalgias.  Skin: Negative for pallor and rash.  Neurological: Negative for dizziness, tremors, weakness, numbness  and headaches.  Hematological: Negative for adenopathy. Does not bruise/bleed easily.  Psychiatric/Behavioral: Negative for decreased concentration and dysphoric mood. The patient is not nervous/anxious.        Objective:   Physical Exam  Constitutional: She appears well-developed and well-nourished. No distress.  obese and well appearing   HENT:  Head: Normocephalic and atraumatic.  Right Ear: External ear normal.  Left Ear: External ear normal.  Mouth/Throat: Oropharynx is clear and moist. No oropharyngeal exudate.  Nares are mildly congested and boggy  No sinus tendenress  Eyes: Conjunctivae and EOM are normal. Pupils are equal, round, and reactive to light.  Neck: Normal range of motion. Neck supple. No JVD present. Carotid bruit is not present. No thyromegaly present.  Cardiovascular: Normal rate, regular rhythm, normal heart sounds and intact distal pulses. Exam reveals no gallop.  Pulmonary/Chest: Effort normal and breath sounds normal. No respiratory distress. She has no wheezes. She has no rales.  No crackles  Abdominal: She exhibits no abdominal bruit.  Musculoskeletal: She exhibits no edema.  Lymphadenopathy:    She has no cervical adenopathy.  Neurological: She is alert. She has normal reflexes.  Skin: Skin is warm and dry. No rash noted. No pallor.  Psychiatric: She has a normal mood and affect.          Assessment & Plan:    Problem List Items Addressed This Visit      Other   Diffuse connective tissue disease ([place])    No clinical changes       Nasal congestion    With loud snoring worse lately  Enc her to use flonase daily instead of prn  Ref to ENT      Relevant Orders   Ambulatory referral to ENT   Snoring - Primary    Loud snoring-worse lately  Obese-enc wt loss  Congestion-enc daily use of flonase No symptoms of sleep apnea  Ref to ENT for eval/tx       Relevant Orders   Ambulatory referral to ENT    Other Visit Diagnoses    Need for influenza vaccination       Relevant Orders   Flu Vaccine QUAD 36+ mos IM (Completed)

## 2017-08-06 NOTE — Patient Instructions (Signed)
Start using flonase or generic flonase every day as directed 2 sprays in each nostril  This will help nasal congestion and snoring  We will call regarding a referral to ENT   In the meantime work on weight loss because that helps also   Flu shot today

## 2017-08-07 NOTE — Assessment & Plan Note (Signed)
With loud snoring worse lately  Enc her to use flonase daily instead of prn  Ref to ENT

## 2017-08-07 NOTE — Assessment & Plan Note (Signed)
Loud snoring-worse lately  Obese-enc wt loss  Congestion-enc daily use of flonase No symptoms of sleep apnea  Ref to ENT for eval/tx

## 2017-08-07 NOTE — Assessment & Plan Note (Signed)
No clinical changes 

## 2017-08-28 ENCOUNTER — Telehealth: Payer: Self-pay | Admitting: Rheumatology

## 2017-08-28 DIAGNOSIS — Z8639 Personal history of other endocrine, nutritional and metabolic disease: Secondary | ICD-10-CM

## 2017-08-28 NOTE — Telephone Encounter (Signed)
Patient called stating that she wants to make sure the Vitamin D3 is on her lab orders.  She is planning to go the lab on [address] the week of March 4.  Please call my home # [phone]

## 2017-08-29 NOTE — Telephone Encounter (Signed)
Patient advised order for vitamin d level has been added to rest of labs.

## 2017-09-16 NOTE — Progress Notes (Signed)
Office Visit Note  Patient: Morgan [name]             Date of Birth: [birth year]           MRN: [id number]             PCP: Abner Greenspan, MD Referring: Tower, Wynelle Fanny, MD Visit Date: 09/30/2017 Occupation: @GUAROCC @    Subjective:  Bilateral knee pain   History of Present Illness: Morgan [name] is a 72 y.o. female with history of diffuse connective tissue disease, osteoarthritis, and DDD.  Patient states she has been doing very well after her last visit.  She denies any mouth sores or nose sores. She denies any sicca symptoms or Raynaud's.  She denies any recent facial rashes.  She continues to see Dr. Ubaldo Glassing for the management of lichenoid dermatitis.  She states she continues to have photosensitivity.  She denies any recent fatigue or hair loss.  Patient states she has discomfort in her bilateral knees.  She denies any she takes ibuprofen for pain relief.  She reports she has chronic lower back pain.  She states her back pain occasionally wakes her up at night.     Activities of Daily Living:  Patient reports morning stiffness fo30 minutes.   Patient Reports nocturnal pain.  Difficulty dressing/grooming: Denies Difficulty climbing stairs: Reports Difficulty getting out of chair: Reports Difficulty using hands for taps, buttons, cutlery, and/or writing: Denies   Review of Systems  Constitutional: Negative for fatigue and weakness.  HENT: Negative for mouth sores, mouth dryness and nose dryness.   Eyes: Negative for pain, redness, visual disturbance and dryness.  Respiratory: Negative for cough, hemoptysis, shortness of breath and difficulty breathing.   Cardiovascular: Negative for chest pain, palpitations, hypertension and swelling in legs/feet.  Gastrointestinal: Negative for blood in stool, constipation and diarrhea.  Endocrine: Negative for increased urination.  Genitourinary: Negative for painful urination.  Musculoskeletal: Positive for arthralgias, joint pain and  morning stiffness. Negative for joint swelling, myalgias, muscle weakness, muscle tenderness and myalgias.  Skin: Positive for rash and sensitivity to sunlight. Negative for color change, hair loss, nodules/bumps, redness, skin tightness and ulcers.  Neurological: Negative for dizziness, numbness and headaches.  Hematological: Negative for swollen glands.  Psychiatric/Behavioral: Negative for depressed mood and sleep disturbance. The patient is not nervous/anxious.     PMFS History:  Patient Active Problem List   Diagnosis Date Noted  . Snoring 08/06/2017  . Nasal congestion 08/06/2017  . Posterior right knee pain 11/22/2016  . Paresthesia of foot, bilateral 08/12/2016  . Right hand pain 08/12/2016  . Encounter for Medicare annual wellness exam 04/13/2014  . Allergic rhinitis 04/13/2014  . Routine general medical examination at a health care facility 04/07/2014  . Multiple thyroid nodules 01/24/2014  . History of small bowel obstruction 01/20/2014  . Low back strain 01/07/2014  . DDD (degenerative disc disease), cervical   . DDD (degenerative disc disease), lumbar   . Spinal stenosis   . Obese   . TINNITUS 06/08/2010  . NONSPECIFIC ABNORMAL TOXICOLOGICAL FINDINGS 05/11/2010  . Hypothyroidism 04/18/2010  . BRADYCARDIA 05/31/2008  . GERD 05/03/2008  . DYSPEPSIA 05/03/2008  . DIVERTICULOSIS OF COLON 04/26/2008  . HEPATITIS, HX OF 04/26/2008  . COLONIC POLYPS, HYPERPLASTIC, HX OF 04/26/2008  . HEMORRHOIDS, INTERNAL 01/11/2008  . DERMATITIS, SEBORRHEIC NEC 01/28/2007  . Hyperlipidemia 01/19/2007  . IBS 01/19/2007  . OVERACTIVE BLADDER 01/19/2007  . POSTMENOPAUSAL STATUS 01/19/2007  . ECZEMA 01/19/2007  . Diffuse connective  tissue disease ([place]) 01/19/2007  . OSTEOARTHRITIS 01/19/2007  . ALLERGY 01/19/2007    Past Medical History:  Diagnosis Date  . Allergy   . Bradycardia   . Connective tissue disease ([place])   . DDD (degenerative disc disease), cervical   . DDD  (degenerative disc disease), lumbar   . Endometrial polyp    not resolved with D&C (following)  . GERD (gastroesophageal reflux disease)    gas/belching issues-was on protonix for short time-not since 6 months  . HLD (hyperlipidemia)   . Mixed connective tissue disease ([place]) diagnosed 5 years ago   no problems since diagnosis  . Multiple thyroid nodules 01/24/2014  . Obesity   . Osteoarthritis of cervical spine   . PONV (postoperative nausea and vomiting)   . Renal calculi   . Spinal stenosis     Family History  Problem Relation Age of Onset  . Coronary artery disease Father   . Heart attack Father        deceased at 21  . Dementia Mother   . Diabetes Maternal Grandmother   . Goiter Maternal Grandmother   . Diabetes Cousin   . Cancer Grandchild        rare type (dying at age 27)  . Colon cancer Neg Hx   . Esophageal cancer Neg Hx   . Stomach cancer Neg Hx   . Rectal cancer Neg Hx    Past Surgical History:  Procedure Laterality Date  . APPENDECTOMY    . COLONOSCOPY    . DILATION AND CURETTAGE OF UTERUS     fibroids  . DILATION AND CURETTAGE OF UTERUS  01/2005   endometrial polyp  . ENDOMETRIAL BIOPSY  2008   fibroid  . HAND SURGERY     carpal tunnel release bilateral Dr. Daylene Katayama  . hysteroscopic resection      polyps of PMB  . TONSILLECTOMY    . TUBAL LIGATION     1973  . UPPER GASTROINTESTINAL ENDOSCOPY     Social History   Social History Narrative   + caffeine use       Objective: Vital Signs: BP 105/69 (BP Location: Left Arm, Patient Position: Sitting, Cuff Size: Small)   Pulse (!) 59   Resp 12   Ht 5' 5.5" (1.664 m)   Wt 184 lb (83.5 kg)   LMP 07/22/1989 (Approximate)   BMI 30.15 kg/m    Physical Exam  Constitutional: She is oriented to person, place, and time. She appears well-developed and well-nourished.  HENT:  Head: Normocephalic and atraumatic.  Eyes: Conjunctivae and EOM are normal.  Neck: Normal range of motion.  Cardiovascular: Normal  rate, regular rhythm, normal heart sounds and intact distal pulses.  Pulmonary/Chest: Effort normal and breath sounds normal.  Abdominal: Soft. Bowel sounds are normal.  Lymphadenopathy:    She has cervical adenopathy.  Neurological: She is alert and oriented to person, place, and time.  Skin: Skin is warm and dry. Capillary refill takes less than 2 seconds.  Psychiatric: She has a normal mood and affect. Her behavior is normal.  Nursing note and vitals reviewed.    Musculoskeletal Exam:C-spine, thoracic, and lumbar spine good ROM.  No midline spinal tenderness.  No SI joint tenderness. Shoulder joints, elbow joints, wrist joints, MCPs, PIPs, and DIPs good ROM with no synovitis. PIP and DIP synovial thickening consistent with osteoarthritis. Subluxation of first and second DIPs. Synovial thickening of bilateral CMC joints.  Hip joints, knee joints, ankle joints, MTPs, PIPs, and DIPs good  ROM with no synovitis.  No warmth or effusion of knees.  No knee crepitus.   CDAI Exam: No CDAI exam completed.    Investigation: No additional findings. CBC Latest Ref Rng & Units 09/24/2017 12/04/2016 09/20/2015  WBC 3.8 - 10.8 Thousand/uL 5.6 5.2 4.0  Hemoglobin 11.7 - 15.5 g/dL 13.4 13.4 12.7  Hematocrit 35.0 - 45.0 % 39.1 40.2 37.5  Platelets 140 - 400 Thousand/uL 229 217.0 249.0   CMP Latest Ref Rng & Units 09/24/2017 12/04/2016 08/12/2016  Glucose 65 - 99 mg/dL 82 84 88  BUN 7 - 25 mg/dL 17 17 12   Creatinine 0.60 - 0.93 mg/dL 0.76 0.74 0.71  Sodium 135 - 146 mmol/L 141 139 140  Potassium 3.5 - 5.3 mmol/L 4.4 4.3 4.0  Chloride 98 - 110 mmol/L 105 105 105  CO2 20 - 32 mmol/L 29 29 30   Calcium 8.6 - 10.4 mg/dL 9.8 9.5 9.0  Total Protein 6.1 - 8.1 g/dL 6.3 6.4 6.5  Total Bilirubin 0.2 - 1.2 mg/dL 0.4 0.6 0.4  Alkaline Phos 39 - 117 U/L - 70 69  AST 10 - 35 U/L 19 20 18   ALT 6 - 29 U/L 14 18 16     Imaging: No results found.  Speciality Comments: No specialty comments available.    Procedures:   No procedures performed Allergies: Codeine; Hydrocodone-acetaminophen; and Penicillins   previously treated with MTX, PLQ, prednisone  Fatigue,hair loss, arthritis, photosensitivity  Positive scl-70 Previously + ANA, dsDNA, smith antibody  Assessment / Plan:     Visit Diagnoses: Diffuse connective tissue disease [facility]): Patient hasn't been seen since August 2017.  She was previously diagnosed by Dr. Earnest Conroy.  She previously had a positive ANA, positive smith antibody, positive Scl-70, and positive dsDNA in 2007.  She was previously started on PLQ and MTX by Dr. Earnest Conroy.  MTX caused hair loss.  She discontinued MTX and was started on long-term prednisone.  Her repeat autoimmune labs have been negative for many years.  She recently had autoimmune labs that were WNL. She does not have any mouth sores or nasal sores on exam.  She has one swollen submandibular lymph node.  She has no synovitis on exam.       Primary osteoarthritis of both hands: She has PIP and DIP synovial thickening consistent with osteoarthritis. Joint protection and muscle strengthening were discussed.   Primary osteoarthritis of both feet: She has no discomfort at this time.  Proper fitting shoes were discussed.   Chronic pain of both knees -She has discomfort in bilateral knees.  She has no warmth or effusion on exam. X-rays of bilateral knees were obtained today.  Plan: XR KNEE 3 VIEW LEFT, XR KNEE 3 VIEW RIGHT.  X-ray of bilateral knee joint showed moderate osteoarthritis and moderate chondromalacia patella.  Handout on knee joint exercises was given.  A list of natural anti-inflammatories was given.  DDD (degenerative disc disease), cervical: She has chronic pain in her neck.  She has good ROM on exam.   DDD (degenerative disc disease), lumbar: Chronic pain.  No midline spinal tenderness.  She takes Ibuprofen for pain relief.   Lichenoid dermatitis: She sees Dr. Ubaldo Glassing on a regular basis.    Eczema, unspecified type  Vitamin D  deficiency: She takes vitamin D 1,000 units daily.  Her vitamin D level was 44 when checked on 09/24/17.   Other medical conditions are listed as follows:   History of small bowel obstruction  Multiple thyroid nodules  Hypothyroidism, unspecified  type  History of gastroesophageal reflux (GERD)     Orders: Orders Placed This Encounter  Procedures  . XR KNEE 3 VIEW LEFT  . XR KNEE 3 VIEW RIGHT   No orders of the defined types were placed in this encounter.   Face-to-face time spent with patient was 30 minutes. >50% of time was spent in counseling and coordination of care.  Follow-Up Instructions: Return for Diffuse connective tissue disease , Osteoarthritis.   Ofilia Neas, PA-C   I examined and evaluated the patient with Hazel Sams PA.  Knee joint x-ray findings were discussed at length.  The plan of care was discussed as noted above.  Bo Merino, MD  Note - This record has been created using Editor, commissioning.  Chart creation errors have been sought, but may not always  have been located. Such creation errors do not reflect on  the standard of medical care.

## 2017-09-22 DIAGNOSIS — H6063 Unspecified chronic otitis externa, bilateral: Secondary | ICD-10-CM | POA: Diagnosis not present

## 2017-09-22 DIAGNOSIS — R0683 Snoring: Secondary | ICD-10-CM | POA: Diagnosis not present

## 2017-09-22 DIAGNOSIS — J301 Allergic rhinitis due to pollen: Secondary | ICD-10-CM | POA: Diagnosis not present

## 2017-09-24 DIAGNOSIS — Z8639 Personal history of other endocrine, nutritional and metabolic disease: Secondary | ICD-10-CM | POA: Diagnosis not present

## 2017-09-24 DIAGNOSIS — Z79899 Other long term (current) drug therapy: Secondary | ICD-10-CM | POA: Diagnosis not present

## 2017-09-24 DIAGNOSIS — M2550 Pain in unspecified joint: Secondary | ICD-10-CM | POA: Diagnosis not present

## 2017-09-24 DIAGNOSIS — R300 Dysuria: Secondary | ICD-10-CM | POA: Diagnosis not present

## 2017-09-25 LAB — CBC WITH DIFFERENTIAL/PLATELET
Basophils Absolute: 22 cells/uL (ref 0–200)
Basophils Relative: 0.4 %
EOS PCT: 1.8 %
Eosinophils Absolute: 101 cells/uL (ref 15–500)
HCT: 39.1 % (ref 35.0–45.0)
Hemoglobin: 13.4 g/dL (ref 11.7–15.5)
Lymphs Abs: 1462 cells/uL (ref 850–3900)
MCH: 31 pg (ref 27.0–33.0)
MCHC: 34.3 g/dL (ref 32.0–36.0)
MCV: 90.5 fL (ref 80.0–100.0)
MONOS PCT: 13.3 %
MPV: 9.9 fL (ref 7.5–12.5)
NEUTROS ABS: 3270 {cells}/uL (ref 1500–7800)
NEUTROS PCT: 58.4 %
PLATELETS: 229 10*3/uL (ref 140–400)
RBC: 4.32 10*6/uL (ref 3.80–5.10)
RDW: 12.6 % (ref 11.0–15.0)
Total Lymphocyte: 26.1 %
WBC mixed population: 745 cells/uL (ref 200–950)
WBC: 5.6 10*3/uL (ref 3.8–10.8)

## 2017-09-25 LAB — ANA: ANA: NEGATIVE

## 2017-09-25 LAB — URINALYSIS, ROUTINE W REFLEX MICROSCOPIC
BACTERIA UA: NONE SEEN /HPF
Bilirubin Urine: NEGATIVE
Glucose, UA: NEGATIVE
HGB URINE DIPSTICK: NEGATIVE
Hyaline Cast: NONE SEEN /LPF
KETONES UR: NEGATIVE
Nitrite: NEGATIVE
Protein, ur: NEGATIVE
RBC / HPF: NONE SEEN /HPF (ref 0–2)
SPECIFIC GRAVITY, URINE: 1.027 (ref 1.001–1.03)

## 2017-09-25 LAB — COMPLETE METABOLIC PANEL WITH GFR
AG Ratio: 2.3 (calc) (ref 1.0–2.5)
ALKALINE PHOSPHATASE (APISO): 82 U/L (ref 33–130)
ALT: 14 U/L (ref 6–29)
AST: 19 U/L (ref 10–35)
Albumin: 4.4 g/dL (ref 3.6–5.1)
BILIRUBIN TOTAL: 0.4 mg/dL (ref 0.2–1.2)
BUN: 17 mg/dL (ref 7–25)
CHLORIDE: 105 mmol/L (ref 98–110)
CO2: 29 mmol/L (ref 20–32)
Calcium: 9.8 mg/dL (ref 8.6–10.4)
Creat: 0.76 mg/dL (ref 0.60–0.93)
GFR, Est African American: 91 mL/min/{1.73_m2} (ref >=60)
GFR, Est Non African American: 79 mL/min/{1.73_m2} (ref >=60)
GLUCOSE: 82 mg/dL (ref 65–99)
Globulin: 1.9 g/dL (calc) (ref 1.9–3.7)
POTASSIUM: 4.4 mmol/L (ref 3.5–5.3)
Sodium: 141 mmol/L (ref 135–146)
Total Protein: 6.3 g/dL (ref 6.1–8.1)

## 2017-09-25 LAB — C3 AND C4
C3 COMPLEMENT: 131 mg/dL (ref 83–193)
C4 COMPLEMENT: 20 mg/dL (ref 15–57)

## 2017-09-25 LAB — ANTI-DNA ANTIBODY, DOUBLE-STRANDED: DS DNA AB: 1 [IU]/mL

## 2017-09-25 LAB — RNP ANTIBODY: RIBONUCLEIC PROTEIN(ENA) ANTIBODY, IGG: NEGATIVE AI

## 2017-09-25 LAB — SJOGRENS SYNDROME-A EXTRACTABLE NUCLEAR ANTIBODY: SSA (RO) (ENA) ANTIBODY, IGG: NEGATIVE AI

## 2017-09-25 LAB — SJOGRENS SYNDROME-B EXTRACTABLE NUCLEAR ANTIBODY: SSB (LA) (ENA) ANTIBODY, IGG: NEGATIVE AI

## 2017-09-25 LAB — ANTI-SMITH ANTIBODY: ENA SM Ab Ser-aCnc: <1 AI

## 2017-09-25 LAB — SEDIMENTATION RATE: SED RATE: 6 mm/h (ref 0–30)

## 2017-09-25 LAB — ANTI-SCLERODERMA ANTIBODY: SCLERODERMA (SCL-70) (ENA) ANTIBODY, IGG: NEGATIVE AI

## 2017-09-25 LAB — VITAMIN D 25 HYDROXY (VIT D DEFICIENCY, FRACTURES): Vit D, 25-Hydroxy: 44 ng/mL (ref 30–100)

## 2017-09-25 NOTE — Telephone Encounter (Signed)
Vit D is normal. She should continue current tt.

## 2017-09-30 ENCOUNTER — Ambulatory Visit (INDEPENDENT_AMBULATORY_CARE_PROVIDER_SITE_OTHER): Payer: PPO | Admitting: Rheumatology

## 2017-09-30 ENCOUNTER — Ambulatory Visit (INDEPENDENT_AMBULATORY_CARE_PROVIDER_SITE_OTHER): Payer: Self-pay

## 2017-09-30 ENCOUNTER — Encounter: Payer: Self-pay | Admitting: Physician Assistant

## 2017-09-30 ENCOUNTER — Ambulatory Visit (INDEPENDENT_AMBULATORY_CARE_PROVIDER_SITE_OTHER): Payer: PPO

## 2017-09-30 VITALS — BP 105/69 | HR 59 | Resp 12 | Ht 65.5 in | Wt 184.0 lb

## 2017-09-30 DIAGNOSIS — Z8719 Personal history of other diseases of the digestive system: Secondary | ICD-10-CM | POA: Diagnosis not present

## 2017-09-30 DIAGNOSIS — G8929 Other chronic pain: Secondary | ICD-10-CM

## 2017-09-30 DIAGNOSIS — M25562 Pain in left knee: Secondary | ICD-10-CM | POA: Diagnosis not present

## 2017-09-30 DIAGNOSIS — M19042 Primary osteoarthritis, left hand: Secondary | ICD-10-CM

## 2017-09-30 DIAGNOSIS — E042 Nontoxic multinodular goiter: Secondary | ICD-10-CM

## 2017-09-30 DIAGNOSIS — M5030 Other cervical disc degeneration, unspecified cervical region: Secondary | ICD-10-CM | POA: Diagnosis not present

## 2017-09-30 DIAGNOSIS — L280 Lichen simplex chronicus: Secondary | ICD-10-CM | POA: Diagnosis not present

## 2017-09-30 DIAGNOSIS — E559 Vitamin D deficiency, unspecified: Secondary | ICD-10-CM | POA: Diagnosis not present

## 2017-09-30 DIAGNOSIS — M19072 Primary osteoarthritis, left ankle and foot: Secondary | ICD-10-CM

## 2017-09-30 DIAGNOSIS — E039 Hypothyroidism, unspecified: Secondary | ICD-10-CM

## 2017-09-30 DIAGNOSIS — L309 Dermatitis, unspecified: Secondary | ICD-10-CM

## 2017-09-30 DIAGNOSIS — M19041 Primary osteoarthritis, right hand: Secondary | ICD-10-CM | POA: Diagnosis not present

## 2017-09-30 DIAGNOSIS — M25561 Pain in right knee: Secondary | ICD-10-CM | POA: Diagnosis not present

## 2017-09-30 DIAGNOSIS — M19071 Primary osteoarthritis, right ankle and foot: Secondary | ICD-10-CM | POA: Diagnosis not present

## 2017-09-30 DIAGNOSIS — M359 Systemic involvement of connective tissue, unspecified: Secondary | ICD-10-CM

## 2017-09-30 DIAGNOSIS — M5136 Other intervertebral disc degeneration, lumbar region: Secondary | ICD-10-CM | POA: Diagnosis not present

## 2017-09-30 NOTE — Patient Instructions (Signed)
Natural anti-inflammatories  You can purchase these at [employer], [employer] or online.  . Turmeric (capsules)  . Ginger (ginger root or capsules)  . Omega 3 (Fish, flax seeds, chia seeds, walnuts, almonds)  . Tart cherry (dried or extract)   Patient should be under the care of a physician while taking these supplements. This may not be reproduced without the permission of Dr. Ko Bardon.   Knee Exercises Ask your health care provider which exercises are safe for you. Do exercises exactly as told by your health care provider and adjust them as directed. It is normal to feel mild stretching, pulling, tightness, or discomfort as you do these exercises, but you should stop right away if you feel sudden pain or your pain gets worse.Do not begin these exercises until told by your health care provider. STRETCHING AND RANGE OF MOTION EXERCISES These exercises warm up your muscles and joints and improve the movement and flexibility of your knee. These exercises also help to relieve pain, numbness, and tingling. Exercise A: Knee Extension, Prone 1. Lie on your abdomen on a bed. 2. Place your left / right knee just beyond the edge of the surface so your knee is not on the bed. You can put a towel under your left / right thigh just above your knee for comfort. 3. Relax your leg muscles and allow gravity to straighten your knee. You should feel a stretch behind your left / right knee. 4. Hold this position for __________ seconds. 5. Scoot up so your knee is supported between repetitions. Repeat __________ times. Complete this stretch __________ times a day. Exercise B: Knee Flexion, Active  1. Lie on your back with both knees straight. If this causes back discomfort, bend your left / right knee so your foot is flat on the floor. 2. Slowly slide your left / right heel back toward your buttocks until you feel a gentle stretch in the front of your knee or thigh. 3. Hold this position  for __________ seconds. 4. Slowly slide your left / right heel back to the starting position. Repeat __________ times. Complete this exercise __________ times a day. Exercise C: Quadriceps, Prone  1. Lie on your abdomen on a firm surface, such as a bed or padded floor. 2. Bend your left / right knee and hold your ankle. If you cannot reach your ankle or pant leg, loop a belt around your foot and grab the belt instead. 3. Gently pull your heel toward your buttocks. Your knee should not slide out to the side. You should feel a stretch in the front of your thigh and knee. 4. Hold this position for __________ seconds. Repeat __________ times. Complete this stretch __________ times a day. Exercise D: Hamstring, Supine 1. Lie on your back. 2. Loop a belt or towel over the ball of your left / right foot. The ball of your foot is on the walking surface, right under your toes. 3. Straighten your left / right knee and slowly pull on the belt to raise your leg until you feel a gentle stretch behind your knee. ? Do not let your left / right knee bend while you do this. ? Keep your other leg flat on the floor. 4. Hold this position for __________ seconds. Repeat __________ times. Complete this stretch __________ times a day. STRENGTHENING EXERCISES These exercises build strength and endurance in your knee. Endurance is the ability to use your muscles for a long time, even after they get tired. Exercise   E: Quadriceps, Isometric  1. Lie on your back with your left / right leg extended and your other knee bent. Put a rolled towel or small pillow under your knee if told by your health care provider. 2. Slowly tense the muscles in the front of your left / right thigh. You should see your kneecap slide up toward your hip or see increased dimpling just above the knee. This motion will push the back of the knee toward the floor. 3. For __________ seconds, keep the muscle as tight as you can without increasing  your pain. 4. Relax the muscles slowly and completely. Repeat __________ times. Complete this exercise __________ times a day. Exercise F: Straight Leg Raises - Quadriceps 1. Lie on your back with your left / right leg extended and your other knee bent. 2. Tense the muscles in the front of your left / right thigh. You should see your kneecap slide up or see increased dimpling just above the knee. Your thigh may even shake a bit. 3. Keep these muscles tight as you raise your leg 4-6 inches (10-15 cm) off the floor. Do not let your knee bend. 4. Hold this position for __________ seconds. 5. Keep these muscles tense as you lower your leg. 6. Relax your muscles slowly and completely after each repetition. Repeat __________ times. Complete this exercise __________ times a day. Exercise G: Hamstring, Isometric 1. Lie on your back on a firm surface. 2. Bend your left / right knee approximately __________ degrees. 3. Dig your left / right heel into the surface as if you are trying to pull it toward your buttocks. Tighten the muscles in the back of your thighs to dig as hard as you can without increasing any pain. 4. Hold this position for __________ seconds. 5. Release the tension gradually and allow your muscles to relax completely for __________ seconds after each repetition. Repeat __________ times. Complete this exercise __________ times a day. Exercise H: Hamstring Curls  If told by your health care provider, do this exercise while wearing ankle weights. Begin with __________ weights. Then increase the weight by 1 lb (0.5 kg) increments. Do not wear ankle weights that are more than __________. 1. Lie on your abdomen with your legs straight. 2. Bend your left / right knee as far as you can without feeling pain. Keep your hips flat against the floor. 3. Hold this position for __________ seconds. 4. Slowly lower your leg to the starting position.  Repeat __________ times. Complete this exercise  __________ times a day. Exercise I: Squats (Quadriceps) 1. Stand in front of a table, with your feet and knees pointing straight ahead. You may rest your hands on the table for balance but not for support. 2. Slowly bend your knees and lower your hips like you are going to sit in a chair. ? Keep your weight over your heels, not over your toes. ? Keep your lower legs upright so they are parallel with the table legs. ? Do not let your hips go lower than your knees. ? Do not bend lower than told by your health care provider. ? If your knee pain increases, do not bend as low. 3. Hold the squat position for __________ seconds. 4. Slowly push with your legs to return to standing. Do not use your hands to pull yourself to standing. Repeat __________ times. Complete this exercise __________ times a day. Exercise J: Wall Slides (Quadriceps)  1. Lean your back against a smooth wall or door   while you walk your feet out 18-24 inches (46-61 cm) from it. 2. Place your feet hip-width apart. 3. Slowly slide down the wall or door until your knees bend __________ degrees. Keep your knees over your heels, not over your toes. Keep your knees in line with your hips. 4. Hold for __________ seconds. Repeat __________ times. Complete this exercise __________ times a day. Exercise K: Straight Leg Raises - Hip Abductors 1. Lie on your side with your left / right leg in the top position. Lie so your head, shoulder, knee, and hip line up. You may bend your bottom knee to help you keep your balance. 2. Roll your hips slightly forward so your hips are stacked directly over each other and your left / right knee is facing forward. 3. Leading with your heel, lift your top leg 4-6 inches (10-15 cm). You should feel the muscles in your outer hip lifting. ? Do not let your foot drift forward. ? Do not let your knee roll toward the ceiling. 4. Hold this position for __________ seconds. 5. Slowly return your leg to the starting  position. 6. Let your muscles relax completely after each repetition. Repeat __________ times. Complete this exercise __________ times a day. Exercise L: Straight Leg Raises - Hip Extensors 1. Lie on your abdomen on a firm surface. You can put a pillow under your hips if that is more comfortable. 2. Tense the muscles in your buttocks and lift your left / right leg about 4-6 inches (10-15 cm). Keep your knee straight as you lift your leg. 3. Hold this position for __________ seconds. 4. Slowly lower your leg to the starting position. 5. Let your leg relax completely after each repetition. Repeat __________ times. Complete this exercise __________ times a day. This information is not intended to replace advice given to you by your health care provider. Make sure you discuss any questions you have with your health care provider. Document Released: 05/22/2005 Document Revised: 04/01/2016 Document Reviewed: 05/14/2015 Elsevier Interactive Patient Education  2018 Elsevier Inc.  

## 2017-10-22 DIAGNOSIS — L820 Inflamed seborrheic keratosis: Secondary | ICD-10-CM | POA: Diagnosis not present

## 2017-10-22 DIAGNOSIS — Z85828 Personal history of other malignant neoplasm of skin: Secondary | ICD-10-CM | POA: Diagnosis not present

## 2017-10-22 DIAGNOSIS — L218 Other seborrheic dermatitis: Secondary | ICD-10-CM | POA: Diagnosis not present

## 2017-10-22 DIAGNOSIS — L570 Actinic keratosis: Secondary | ICD-10-CM | POA: Diagnosis not present

## 2017-10-22 DIAGNOSIS — L821 Other seborrheic keratosis: Secondary | ICD-10-CM | POA: Diagnosis not present

## 2017-11-24 ENCOUNTER — Ambulatory Visit: Payer: PPO | Admitting: Rheumatology

## 2018-01-05 ENCOUNTER — Telehealth: Payer: Self-pay | Admitting: Family Medicine

## 2018-01-05 DIAGNOSIS — E039 Hypothyroidism, unspecified: Secondary | ICD-10-CM

## 2018-01-05 DIAGNOSIS — Z0000 Encounter for general adult medical examination without abnormal findings: Secondary | ICD-10-CM

## 2018-01-05 DIAGNOSIS — E7800 Pure hypercholesterolemia, unspecified: Secondary | ICD-10-CM

## 2018-01-05 NOTE — Telephone Encounter (Signed)
-----   Message from Eustace Pen, LPN sent at 9/53/2023  3:14 PM EDT ----- Regarding: Labs 6/18 Lab orders needed. Thank you.  Insurance:  [employer]

## 2018-01-06 ENCOUNTER — Ambulatory Visit (INDEPENDENT_AMBULATORY_CARE_PROVIDER_SITE_OTHER): Payer: PPO

## 2018-01-06 VITALS — BP 92/70 | HR 50 | Temp 97.9°F | Ht 64.5 in | Wt 179.2 lb

## 2018-01-06 DIAGNOSIS — Z0000 Encounter for general adult medical examination without abnormal findings: Secondary | ICD-10-CM | POA: Diagnosis not present

## 2018-01-06 DIAGNOSIS — E039 Hypothyroidism, unspecified: Secondary | ICD-10-CM

## 2018-01-06 DIAGNOSIS — E7800 Pure hypercholesterolemia, unspecified: Secondary | ICD-10-CM

## 2018-01-06 LAB — CBC WITH DIFFERENTIAL/PLATELET
BASOS PCT: 0.5 % (ref 0.0–3.0)
Basophils Absolute: 0 10*3/uL (ref 0.0–0.1)
EOS PCT: 2.1 % (ref 0.0–5.0)
Eosinophils Absolute: 0.1 10*3/uL (ref 0.0–0.7)
HEMATOCRIT: 39.6 % (ref 36.0–46.0)
HEMOGLOBIN: 13.6 g/dL (ref 12.0–15.0)
Lymphocytes Relative: 29.3 % (ref 12.0–46.0)
Lymphs Abs: 1.5 10*3/uL (ref 0.7–4.0)
MCHC: 34.2 g/dL (ref 30.0–36.0)
MCV: 90.8 fl (ref 78.0–100.0)
MONOS PCT: 14.8 % — AB (ref 3.0–12.0)
Monocytes Absolute: 0.8 10*3/uL (ref 0.1–1.0)
Neutro Abs: 2.7 10*3/uL (ref 1.4–7.7)
Neutrophils Relative %: 53.3 % (ref 43.0–77.0)
Platelets: 210 10*3/uL (ref 150.0–400.0)
RBC: 4.36 Mil/uL (ref 3.87–5.11)
RDW: 13.5 % (ref 11.5–15.5)
WBC: 5.1 10*3/uL (ref 4.0–10.5)

## 2018-01-06 LAB — COMPREHENSIVE METABOLIC PANEL
ALBUMIN: 4.5 g/dL (ref 3.5–5.2)
ALT: 17 U/L (ref 0–35)
AST: 20 U/L (ref 0–37)
Alkaline Phosphatase: 83 U/L (ref 39–117)
BUN: 14 mg/dL (ref 6–23)
CALCIUM: 9.8 mg/dL (ref 8.4–10.5)
CHLORIDE: 103 meq/L (ref 96–112)
CO2: 30 mEq/L (ref 19–32)
Creatinine, Ser: 0.83 mg/dL (ref 0.40–1.20)
GFR: 71.94 mL/min (ref >60.00)
Glucose, Bld: 89 mg/dL (ref 70–99)
POTASSIUM: 4.7 meq/L (ref 3.5–5.1)
SODIUM: 141 meq/L (ref 135–145)
Total Bilirubin: 0.6 mg/dL (ref 0.2–1.2)
Total Protein: 6.6 g/dL (ref 6.0–8.3)

## 2018-01-06 LAB — LIPID PANEL
CHOL/HDL RATIO: 4
Cholesterol: 209 mg/dL — ABNORMAL HIGH (ref 0–200)
HDL: 50 mg/dL (ref >39.00)
LDL Cholesterol: 136 mg/dL — ABNORMAL HIGH (ref 0–99)
NONHDL: 158.61
TRIGLYCERIDES: 113 mg/dL (ref 0.0–149.0)
VLDL: 22.6 mg/dL (ref 0.0–40.0)

## 2018-01-06 LAB — TSH: TSH: 1.12 u[IU]/mL (ref 0.35–4.50)

## 2018-01-06 NOTE — Patient Instructions (Signed)
Morgan [name] , Thank you for taking time to come for your Medicare Wellness Visit. I appreciate your ongoing commitment to your health goals. Please review the following plan we discussed and let me know if I can assist you in the future.   These are the goals we discussed: Goals    . Patient Stated     Starting 01/06/2018, I will continue to take medications as prescribed.        This is a list of the screening recommended for you and due dates:  Health Maintenance  Topic Date Due  . Colon Cancer Screening  07/21/2018*  . Flu Shot  02/19/2018  . Mammogram  04/09/2018  . Tetanus Vaccine  05/29/2027  . DEXA scan (bone density measurement)  Completed  .  Hepatitis C: One time screening is recommended by Center for Disease Control  (CDC) for  adults born from [birth year] through 1964.   Completed  . Pneumonia vaccines  Completed  *Topic was postponed. The date shown is not the original due date.   Preventive Care for Adults  A healthy lifestyle and preventive care can promote health and wellness. Preventive health guidelines for adults include the following key practices.  . A routine yearly physical is a good way to check with your health care provider about your health and preventive screening. It is a chance to share any concerns and updates on your health and to receive a thorough exam.  . Visit your dentist for a routine exam and preventive care every 6 months. Brush your teeth twice a day and floss once a day. Good oral hygiene prevents tooth decay and gum disease.  . The frequency of eye exams is based on your age, health, family medical history, use  of contact lenses, and other factors. Follow your health care provider's recommendations for frequency of eye exams.  . Eat a healthy diet. Foods like vegetables, fruits, whole grains, low-fat dairy products, and lean protein foods contain the nutrients you need without too many calories. Decrease your intake of foods high in solid fats,  added sugars, and salt. Eat the right amount of calories for you. Get information about a proper diet from your health care provider, if necessary.  . Regular physical exercise is one of the most important things you can do for your health. Most adults should get at least 150 minutes of moderate-intensity exercise (any activity that increases your heart rate and causes you to sweat) each week. In addition, most adults need muscle-strengthening exercises on 2 or more days a week.  Silver Sneakers may be a benefit available to you. To determine eligibility, you may visit the website: www.silversneakers.com or contact program at 249-545-8174 Mon-Fri between 8AM-8PM.   . Maintain a healthy weight. The body mass index (BMI) is a screening tool to identify possible weight problems. It provides an estimate of body fat based on height and weight. Your health care provider can find your BMI and can help you achieve or maintain a healthy weight.   For adults 20 years and older: ? A BMI below 18.5 is considered underweight. ? A BMI of 18.5 to 24.9 is normal. ? A BMI of 25 to 29.9 is considered overweight. ? A BMI of 30 and above is considered obese.   . Maintain normal blood lipids and cholesterol levels by exercising and minimizing your intake of saturated fat. Eat a balanced diet with plenty of fruit and vegetables. Blood tests for lipids and cholesterol should begin at  age 41 and be repeated every 5 years. If your lipid or cholesterol levels are high, you are over 50, or you are at high risk for heart disease, you may need your cholesterol levels checked more frequently. Ongoing high lipid and cholesterol levels should be treated with medicines if diet and exercise are not working.  . If you smoke, find out from your health care provider how to quit. If you do not use tobacco, please do not start.  . If you choose to drink alcohol, please do not consume more than 2 drinks per day. One drink is  considered to be 12 ounces (355 mL) of beer, 5 ounces (148 mL) of wine, or 1.5 ounces (44 mL) of liquor.  . If you are 61-37 years old, ask your health care provider if you should take aspirin to prevent strokes.  . Use sunscreen. Apply sunscreen liberally and repeatedly throughout the day. You should seek shade when your shadow is shorter than you. Protect yourself by wearing long sleeves, pants, a wide-brimmed hat, and sunglasses year round, whenever you are outdoors.  . Once a month, do a whole body skin exam, using a mirror to look at the skin on your back. Tell your health care provider of new moles, moles that have irregular borders, moles that are larger than a pencil eraser, or moles that have changed in shape or color.

## 2018-01-06 NOTE — Progress Notes (Signed)
PCP notes:   Health maintenance:  Colon cancer screening - addressed  Abnormal screenings:   None  Patient concerns:   None  Nurse concerns:  None  Next PCP appt:   01/19/18 @ 2440  I reviewed health advisor's note, was available for consultation, and agree with documentation and plan. Loura Pardon MD

## 2018-01-06 NOTE — Progress Notes (Signed)
Subjective:   Morgan [name] is a 72 y.o. female who presents for Medicare Annual (Subsequent) preventive examination.  Review of Systems:  N/A Cardiac Risk Factors include: advanced age (>65men, >26 women);obesity (BMI >30kg/m2);dyslipidemia     Objective:     Vitals: BP 92/70 (BP Location: Right Arm, Patient Position: Sitting, Cuff Size: Normal)   Pulse (!) 50   Temp 97.9 F (36.6 C) (Oral)   Ht 5' 4.5" (1.638 m) Comment: no shoes  Wt 179 lb 4 oz (81.3 kg)   LMP 07/22/1989 (Approximate)   SpO2 95%   BMI 30.29 kg/m   Body mass index is 30.29 kg/m.  Advanced Directives 01/06/2018 12/04/2016 09/22/2015 01/20/2014  Does Patient Have a Medical Advance Directive? Yes Yes Yes Patient has advance directive, copy not in chart  Type of Advance Directive [place];Living will [place];Living will [place];Living will [place];Living will  Does patient want to make changes to medical advance directive? - - No - Patient declined No change requested  Copy of [place] in Chart? Yes No - copy requested No - copy requested Copy requested from family  Pre-existing out of facility DNR order (yellow form or pink MOST form) - - - No    Tobacco Social History   Tobacco Use  Smoking Status Former Smoker  . Types: Cigarettes  . Last attempt to quit: 07/22/1965  . Years since quitting: 52.4  Smokeless Tobacco Never Used     Counseling given: No   Clinical Intake:  Pre-visit preparation completed: Yes  Pain : No/denies pain Pain Score: 0-No pain     Nutritional Status: BMI > 30  Obese Nutritional Risks: None  How often do you need to have someone help you when you read instructions, pamphlets, or other written materials from your doctor or pharmacy?: 1 - Never What is the last grade level you completed in school?: 12th grade  Interpreter Needed?: No  Comments: pt lives with  spouse Information entered by :: LPinson, LPN  Past Medical History:  Diagnosis Date  . Allergy   . Bradycardia   . Connective tissue disease ([place])   . DDD (degenerative disc disease), cervical   . DDD (degenerative disc disease), lumbar   . Endometrial polyp    not resolved with D&C (following)  . GERD (gastroesophageal reflux disease)    gas/belching issues-was on protonix for short time-not since 6 months  . HLD (hyperlipidemia)   . Mixed connective tissue disease ([place]) diagnosed 5 years ago   no problems since diagnosis  . Multiple thyroid nodules 01/24/2014  . Obesity   . Osteoarthritis of cervical spine   . PONV (postoperative nausea and vomiting)   . Renal calculi   . Spinal stenosis    Past Surgical History:  Procedure Laterality Date  . APPENDECTOMY    . COLONOSCOPY    . DILATION AND CURETTAGE OF UTERUS     fibroids  . DILATION AND CURETTAGE OF UTERUS  01/2005   endometrial polyp  . ENDOMETRIAL BIOPSY  2008   fibroid  . HAND SURGERY     carpal tunnel release bilateral Dr. Daylene Katayama  . hysteroscopic resection      polyps of PMB  . TONSILLECTOMY    . TUBAL LIGATION     1973  . UPPER GASTROINTESTINAL ENDOSCOPY     Family History  Problem Relation Age of Onset  . Coronary artery disease Father   .  Heart attack Father        deceased at 26  . Dementia Mother   . Diabetes Maternal Grandmother   . Goiter Maternal Grandmother   . Diabetes Cousin   . Cancer Grandchild        rare type (dying at age 46)  . Colon cancer Neg Hx   . Esophageal cancer Neg Hx   . Stomach cancer Neg Hx   . Rectal cancer Neg Hx    Social History   Socioeconomic History  . Marital status: Married    Spouse name: Not on file  . Number of children: 4  . Years of education: HS  . Highest education level: Not on file  Occupational History  . Occupation: Retired   Scientific laboratory technician  . Financial resource strain: Not on file  . Food insecurity:    Worry: Not on file    Inability: Not on  file  . Transportation needs:    Medical: Not on file    Non-medical: Not on file  Tobacco Use  . Smoking status: Former Smoker    Types: Cigarettes    Last attempt to quit: 07/22/1965    Years since quitting: 52.4  . Smokeless tobacco: Never Used  Substance and Sexual Activity  . Alcohol use: No  . Drug use: No  . Sexual activity: Yes    Birth control/protection: Surgical    Comment: BTL  Lifestyle  . Physical activity:    Days per week: Not on file    Minutes per session: Not on file  . Stress: Not on file  Relationships  . Social connections:    Talks on phone: Not on file    Gets together: Not on file    Attends religious service: Not on file    Active member of club or organization: Not on file    Attends meetings of clubs or organizations: Not on file    Relationship status: Not on file  Other Topics Concern  . Not on file  Social History Narrative   + caffeine use      Outpatient Encounter Medications as of 01/06/2018  Medication Sig  . azelastine (ASTELIN) 0.1 % nasal spray   . Cholecalciferol (VITAMIN D-3) 1000 units CAPS Take daily by mouth.  . Clobetasol Propionate Emulsion 0.05 % topical foam Apply 1 application topically 2 (two) times a week.   . fluticasone (FLONASE) 50 MCG/ACT nasal spray   . ibuprofen (ADVIL,MOTRIN) 200 MG tablet Take 400 mg by mouth as needed.  . Lactobacillus Rhamnosus, GG, (CULTURELLE PO) Take 1 tablet by mouth daily. Culturelle  . levothyroxine (SYNTHROID, LEVOTHROID) 50 MCG tablet Take 50 mcg 3 times a week (Mon, Wed, Fri),25 mcg 4 times a week (Tues, [place], Sat, Sun)  . polyethylene glycol (MIRALAX / GLYCOLAX) packet You may use this as needed for more acute constipation.  Follow label instructions.  . Prenatal Vit-Fe Fumarate-FA (PRENATAL PO) Take 1 tablet by mouth daily.  . valACYclovir (VALTREX) 1000 MG tablet 2 tablets twice daily for 1 day for fever blisters  . [DISCONTINUED] estradiol (VIVELLE-DOT) 0.025 MG/24HR Place 1 patch  onto the skin 2 (two) times a week.  . [DISCONTINUED] progesterone (PROMETRIUM) 100 MG capsule 1 tablet po daily   No facility-administered encounter medications on file as of 01/06/2018.     Activities of Daily Living In your present state of health, do you have any difficulty performing the following activities: 01/06/2018  Hearing? N  Vision? N  Difficulty concentrating  or making decisions? N  Walking or climbing stairs? N  Dressing or bathing? N  Doing errands, shopping? N  Preparing Food and eating ? N  Using the Toilet? N  In the past six months, have you accidently leaked urine? N  Do you have problems with loss of bowel control? N  Managing your Medications? N  Managing your Finances? N  Housekeeping or managing your Housekeeping? N  Some recent data might be hidden    Patient Care Team: Tower, Wynelle Fanny, MD as PCP - General Rolm Bookbinder, MD as Consulting Physician (Dermatology) Marybelle Killings, MD as Consulting Physician (Orthopedic Surgery) Kem Boroughs, [place] as Nurse Practitioner (Family Medicine) Ronnald Collum, Lourdes Sledge, MD as Attending Physician (Endocrinology) Bo Merino, MD as Consulting Physician (Rheumatology) Webb Laws, [place] as Referring Physician (Optometry)    Assessment:   This is a routine wellness examination for [place].   Hearing Screening   125Hz  250Hz  500Hz  1000Hz  2000Hz  3000Hz  4000Hz  6000Hz  8000Hz   Right ear:   40 40 40  40    Left ear:   40 40 40  40    Vision Screening Comments: Eye exam 03/2017 with Dr. Einar Gip    Exercise Activities and Dietary recommendations Current Exercise Habits: The patient does not participate in regular exercise at present  Goals    . Patient Stated     Starting 01/06/2018, I will continue to take medications as prescribed.        Fall Risk Fall Risk  01/06/2018 12/04/2016 09/22/2015 04/13/2014  Falls in the past year? No No No No   Depression Screen PHQ 2/9 Scores 01/06/2018 12/04/2016 09/22/2015 04/13/2014   PHQ - 2 Score 0 0 0 0  PHQ- 9 Score 0 - - -     Cognitive Function MMSE - Mini Mental State Exam 01/06/2018 12/04/2016 09/22/2015  Orientation to time 5 5 5   Orientation to Place 5 5 5   Registration 3 3 3   Attention/ Calculation 0 0 5  Recall 3 3 3   Language- name 2 objects 0 0 0  Language- repeat 1 1 1   Language- follow 3 step command 3 3 3   Language- read & follow direction 0 0 1  Write a sentence 0 0 0  Copy design 0 0 0  Total score 20 20 26        Hearing Screening   125Hz  250Hz  500Hz  1000Hz  2000Hz  3000Hz  4000Hz  6000Hz  8000Hz   Right ear:   40 40 40  40    Left ear:   40 40 40  40    Vision Screening Comments: Eye exam 03/2017 with Dr. Einar Gip      Immunization History  Administered Date(s) Administered  . Influenza,inj,Quad PF,6+ Mos 04/13/2014, 09/22/2015, 08/12/2016, 08/06/2017  . Pneumococcal Conjugate-13 04/13/2014  . Pneumococcal Polysaccharide-23 09/15/2012  . Td 11/07/1997, 01/28/2007  . Tdap 05/28/2017  . Zoster 09/09/2006    Screening Tests Health Maintenance  Topic Date Due  . COLONOSCOPY  07/21/2018 (Originally 04/15/2017)  . INFLUENZA VACCINE  02/19/2018  . MAMMOGRAM  04/09/2018  . TETANUS/TDAP  05/29/2027  . DEXA SCAN  Completed  . Hepatitis C Screening  Completed  . PNA vac Low Risk Adult  Completed      Plan:   I have personally reviewed, addressed, and noted the following in the patient's chart:  A. Medical and social history B. Use of alcohol, tobacco or illicit drugs  C. Current medications and supplements D. Functional ability and status E.  Nutritional status F.  Physical activity G. Advance directives H. List of other physicians I.  Hospitalizations, surgeries, and ER visits in previous 12 months J.  [place] to include hearing, vision, cognitive, depression L. Referrals and appointments - none  In addition, I have reviewed and discussed with patient certain preventive protocols, quality metrics, and best practice  recommendations. A written personalized care plan for preventive services as well as general preventive health recommendations were provided to patient.  See attached scanned questionnaire for additional information.   Signed,   Lindell Noe, MHA, BS, LPN Health Coach

## 2018-01-07 DIAGNOSIS — L219 Seborrheic dermatitis, unspecified: Secondary | ICD-10-CM | POA: Diagnosis not present

## 2018-01-07 DIAGNOSIS — B009 Herpesviral infection, unspecified: Secondary | ICD-10-CM | POA: Diagnosis not present

## 2018-01-07 DIAGNOSIS — L570 Actinic keratosis: Secondary | ICD-10-CM | POA: Diagnosis not present

## 2018-01-07 DIAGNOSIS — L821 Other seborrheic keratosis: Secondary | ICD-10-CM | POA: Diagnosis not present

## 2018-01-07 DIAGNOSIS — L820 Inflamed seborrheic keratosis: Secondary | ICD-10-CM | POA: Diagnosis not present

## 2018-01-07 DIAGNOSIS — L309 Dermatitis, unspecified: Secondary | ICD-10-CM | POA: Diagnosis not present

## 2018-01-07 DIAGNOSIS — Z85828 Personal history of other malignant neoplasm of skin: Secondary | ICD-10-CM | POA: Diagnosis not present

## 2018-01-13 ENCOUNTER — Ambulatory Visit: Payer: PPO | Admitting: Family Medicine

## 2018-01-19 ENCOUNTER — Ambulatory Visit (INDEPENDENT_AMBULATORY_CARE_PROVIDER_SITE_OTHER): Payer: PPO | Admitting: Family Medicine

## 2018-01-19 ENCOUNTER — Encounter: Payer: Self-pay | Admitting: Family Medicine

## 2018-01-19 VITALS — BP 116/78 | HR 58 | Temp 98.6°F | Ht 64.5 in | Wt 182.8 lb

## 2018-01-19 DIAGNOSIS — E039 Hypothyroidism, unspecified: Secondary | ICD-10-CM | POA: Diagnosis not present

## 2018-01-19 DIAGNOSIS — Z0000 Encounter for general adult medical examination without abnormal findings: Secondary | ICD-10-CM | POA: Diagnosis not present

## 2018-01-19 DIAGNOSIS — M359 Systemic involvement of connective tissue, unspecified: Secondary | ICD-10-CM | POA: Diagnosis not present

## 2018-01-19 DIAGNOSIS — E6609 Other obesity due to excess calories: Secondary | ICD-10-CM | POA: Diagnosis not present

## 2018-01-19 DIAGNOSIS — E7800 Pure hypercholesterolemia, unspecified: Secondary | ICD-10-CM | POA: Diagnosis not present

## 2018-01-19 DIAGNOSIS — Z6830 Body mass index (BMI) 30.0-30.9, adult: Secondary | ICD-10-CM

## 2018-01-19 NOTE — Assessment & Plan Note (Addendum)
Hypothyroidism  Pt has no clinical changes No change in energy level/ hair or skin/ edema and no tremor Lab Results  Component Value Date   TSH 1.12 01/06/2018    Managed by endocrinology

## 2018-01-19 NOTE — Assessment & Plan Note (Signed)
Continues rheumatologic f/u  Stable

## 2018-01-19 NOTE — Patient Instructions (Addendum)
Let us know when you are ready to schedule a colonoscopy  If you change your mind - let us know so we can initiate other screening   Don't use the rolled pillow any more  Let us know if the head pain persists   For healthy diet Try to get most of your carbohydrates from produce (with the exception of white potatoes)  Eat less bread/pasta/rice/snack foods/cereals/sweets and other items from the middle of the grocery store (processed carbs) Avoid red meat/ fried foods/ egg yolks/ fatty breakfast meats/ butter, cheese and high fat dairy/ and shellfish

## 2018-01-19 NOTE — Progress Notes (Signed)
Subjective:    Patient ID: Morgan [name], female    DOB: 1946/01/19, 72 y.o.   MRN: [id number]  HPI  Here for health maintenance exam and to review chronic medical problems    Busy with life in general  Has been to the beach several times already   Had amw on 6/18 No concerns    Wt Readings from Last 3 Encounters:  01/19/18 182 lb 12 oz (82.9 kg)  01/06/18 179 lb 4 oz (81.3 kg)  09/30/17 184 lb (83.5 kg)  dislikes exercise and refuses to do it  30.88 kg/m   Colon cancer screening  Colonoscopy 9/13 with 5 y recall , serrated adenoma  She will consider doing that in the winter   Mammogram 9/18 Self breast exam - no lumps or changes   Goes to gyn [facility] Is off HRT now - was not as bad as she thought it would be (4 hot flashes per day)   occ pains in L post head- fleeting May be from her rolled pillow (got up with back of head numb)    dexa 11/18 -all areas BMD in the normal range  No ca due to kidney stones Vit D-taking (rheumatologist checks this and it was a little improved)   Takes probiotics and a prenatal vitamin   zostavax 2/08  Hypothyroidism  Pt has no clinical changes No change in energy level/ hair or skin/ edema and no tremor Lab Results  Component Value Date   TSH 1.12 01/06/2018    Sees endo  H/o thyroid nodules    BP Readings from Last 3 Encounters:  01/19/18 116/78  01/06/18 92/70  09/30/17 105/69   Hyperlipidemia  Lab Results  Component Value Date   CHOL 209 (H) 01/06/2018   CHOL 224 (H) 12/04/2016   CHOL 198 09/20/2015   Lab Results  Component Value Date   HDL 50.00 01/06/2018   HDL 48.00 12/04/2016   HDL 46.30 09/20/2015   Lab Results  Component Value Date   LDLCALC 136 (H) 01/06/2018   LDLCALC 149 (H) 12/04/2016   LDLCALC 134 (H) 09/20/2015   Lab Results  Component Value Date   TRIG 113.0 01/06/2018   TRIG 134.0 12/04/2016   TRIG 86.0 09/20/2015   Lab Results  Component Value Date   CHOLHDL 4 01/06/2018    CHOLHDL 5 12/04/2016   CHOLHDL 4 09/20/2015   Lab Results  Component Value Date   LDLDIRECT 148.8 10/09/2010   LDLDIRECT 166.4 08/09/2010   LDLDIRECT 144.5 01/29/2008   Intolerant to statins  LDL is a little improved  Watches diet- but not closely   Lab Results  Component Value Date   CREATININE 0.83 01/06/2018   BUN 14 01/06/2018   NA 141 01/06/2018   K 4.7 01/06/2018   CL 103 01/06/2018   CO2 30 01/06/2018   Lab Results  Component Value Date   ALT 17 01/06/2018   AST 20 01/06/2018   ALKPHOS 83 01/06/2018   BILITOT 0.6 01/06/2018   Lab Results  Component Value Date   WBC 5.1 01/06/2018   HGB 13.6 01/06/2018   HCT 39.6 01/06/2018   MCV 90.8 01/06/2018   PLT 210.0 01/06/2018   Patient Active Problem List   Diagnosis Date Noted  . Snoring 08/06/2017  . Nasal congestion 08/06/2017  . Posterior right knee pain 11/22/2016  . Paresthesia of foot, bilateral 08/12/2016  . Right hand pain 08/12/2016  . Encounter for Medicare annual wellness exam 04/13/2014  .  Allergic rhinitis 04/13/2014  . Routine general medical examination at a health care facility 04/07/2014  . Multiple thyroid nodules 01/24/2014  . History of small bowel obstruction 01/20/2014  . Low back strain 01/07/2014  . DDD (degenerative disc disease), cervical   . DDD (degenerative disc disease), lumbar   . Spinal stenosis   . Obese   . TINNITUS 06/08/2010  . NONSPECIFIC ABNORMAL TOXICOLOGICAL FINDINGS 05/11/2010  . Hypothyroidism 04/18/2010  . BRADYCARDIA 05/31/2008  . GERD 05/03/2008  . DYSPEPSIA 05/03/2008  . DIVERTICULOSIS OF COLON 04/26/2008  . HEPATITIS, HX OF 04/26/2008  . COLONIC POLYPS, HYPERPLASTIC, HX OF 04/26/2008  . HEMORRHOIDS, INTERNAL 01/11/2008  . DERMATITIS, SEBORRHEIC NEC 01/28/2007  . Hyperlipidemia 01/19/2007  . IBS 01/19/2007  . OVERACTIVE BLADDER 01/19/2007  . POSTMENOPAUSAL STATUS 01/19/2007  . ECZEMA 01/19/2007  . Diffuse connective tissue disease ([place]) 01/19/2007    . OSTEOARTHRITIS 01/19/2007  . ALLERGY 01/19/2007   Past Medical History:  Diagnosis Date  . Allergy   . Bradycardia   . Connective tissue disease ([place])   . DDD (degenerative disc disease), cervical   . DDD (degenerative disc disease), lumbar   . Endometrial polyp    not resolved with D&C (following)  . GERD (gastroesophageal reflux disease)    gas/belching issues-was on protonix for short time-not since 6 months  . HLD (hyperlipidemia)   . Mixed connective tissue disease ([place]) diagnosed 5 years ago   no problems since diagnosis  . Multiple thyroid nodules 01/24/2014  . Obesity   . Osteoarthritis of cervical spine   . PONV (postoperative nausea and vomiting)   . Renal calculi   . Spinal stenosis    Past Surgical History:  Procedure Laterality Date  . APPENDECTOMY    . COLONOSCOPY    . DILATION AND CURETTAGE OF UTERUS     fibroids  . DILATION AND CURETTAGE OF UTERUS  01/2005   endometrial polyp  . ENDOMETRIAL BIOPSY  2008   fibroid  . HAND SURGERY     carpal tunnel release bilateral Dr. Daylene Katayama  . hysteroscopic resection      polyps of PMB  . TONSILLECTOMY    . TUBAL LIGATION     1973  . UPPER GASTROINTESTINAL ENDOSCOPY     Social History   Tobacco Use  . Smoking status: Former Smoker    Types: Cigarettes    Last attempt to quit: 07/22/1965    Years since quitting: 52.5  . Smokeless tobacco: Never Used  Substance Use Topics  . Alcohol use: No  . Drug use: No   Family History  Problem Relation Age of Onset  . Coronary artery disease Father   . Heart attack Father        deceased at 41  . Dementia Mother   . Diabetes Maternal Grandmother   . Goiter Maternal Grandmother   . Diabetes Cousin   . Cancer Grandchild        rare type (dying at age 73)  . Colon cancer Neg Hx   . Esophageal cancer Neg Hx   . Stomach cancer Neg Hx   . Rectal cancer Neg Hx    Allergies  Allergen Reactions  . Codeine Nausea And Vomiting  . Hydrocodone-Acetaminophen      REACTION: reaction not known  . Penicillins Hives   Current Outpatient Medications on File Prior to Visit  Medication Sig Dispense Refill  . azelastine (ASTELIN) 0.1 % nasal spray     . Cholecalciferol (VITAMIN D-3) 1000 units CAPS  Take daily by mouth.    . Clobetasol Propionate Emulsion 0.05 % topical foam Apply 1 application topically 2 (two) times a week.     . fluticasone (FLONASE) 50 MCG/ACT nasal spray     . ibuprofen (ADVIL,MOTRIN) 200 MG tablet Take 400 mg by mouth as needed.    . Lactobacillus Rhamnosus, GG, (CULTURELLE PO) Take 1 tablet by mouth daily. Culturelle    . levothyroxine (SYNTHROID, LEVOTHROID) 50 MCG tablet Take 50 mcg 3 times a week (Mon, Wed, Fri),25 mcg 4 times a week (Tues, [place], Sat, Sun)    . polyethylene glycol (MIRALAX / GLYCOLAX) packet You may use this as needed for more acute constipation.  Follow label instructions. 14 each 0  . Prenatal Vit-Fe Fumarate-FA (PRENATAL PO) Take 1 tablet by mouth daily.    . valACYclovir (VALTREX) 1000 MG tablet 2 tablets twice daily for 1 day for fever blisters 30 tablet 6   No current facility-administered medications on file prior to visit.      Review of Systems  Constitutional: Negative for activity change, appetite change, fatigue, fever and unexpected weight change.  HENT: Negative for congestion, ear pain, rhinorrhea, sinus pressure and sore throat.   Eyes: Negative for pain, redness and visual disturbance.  Respiratory: Negative for cough, shortness of breath and wheezing.   Cardiovascular: Negative for chest pain and palpitations.  Gastrointestinal: Negative for abdominal pain, blood in stool, constipation and diarrhea.  Endocrine: Negative for polydipsia and polyuria.  Genitourinary: Negative for dysuria, frequency and urgency.  Musculoskeletal: Positive for arthralgias and back pain. Negative for myalgias.  Skin: Positive for rash. Negative for pallor.       Eczema   Allergic/Immunologic: Negative for  environmental allergies.  Neurological: Negative for dizziness, syncope and headaches.       Pain in back of head/neck after using a rolled pillow  Hematological: Negative for adenopathy. Does not bruise/bleed easily.  Psychiatric/Behavioral: Negative for decreased concentration and dysphoric mood. The patient is not nervous/anxious.        Objective:   Physical Exam  Constitutional: She appears well-developed and well-nourished. No distress.  obese and well appearing   HENT:  Head: Normocephalic and atraumatic.  Right Ear: External ear normal.  Left Ear: External ear normal.  Mouth/Throat: Oropharynx is clear and moist.  Clear ear canals  Eyes: Pupils are equal, round, and reactive to light. Conjunctivae and EOM are normal. No scleral icterus.  Neck: Normal range of motion. Neck supple. No JVD present. Carotid bruit is not present. No thyromegaly present.  Cardiovascular: Normal rate, regular rhythm, normal heart sounds and intact distal pulses. Exam reveals no gallop.  Pulmonary/Chest: Effort normal and breath sounds normal. No respiratory distress. She has no wheezes. She exhibits no tenderness. No breast tenderness, discharge or bleeding.  Abdominal: Soft. Bowel sounds are normal. She exhibits no distension, no abdominal bruit and no mass. There is no tenderness.  Genitourinary: No breast tenderness, discharge or bleeding.  Genitourinary Comments: Pt has breast exam with gyn  Musculoskeletal: Normal range of motion. She exhibits no edema or tenderness.  Lymphadenopathy:    She has no cervical adenopathy.  Neurological: She is alert. She has normal reflexes. No cranial nerve deficit. She exhibits normal muscle tone. Coordination normal.  Skin: Skin is warm and dry. No rash noted. No erythema. No pallor.  Areas of dry skin with pink color- diffuse on extremities consistent with eczema   Psychiatric: She has a normal mood and affect.  Pleasant  Assessment & Plan:    Problem List Items Addressed This Visit      Endocrine   Hypothyroidism    Hypothyroidism  Pt has no clinical changes No change in energy level/ hair or skin/ edema and no tremor Lab Results  Component Value Date   TSH 1.12 01/06/2018    Managed by endocrinology        Other   Diffuse connective tissue disease ([place])    Continues rheumatologic f/u  Stable       Hyperlipidemia    Disc goals for lipids and reasons to control them Rev last labs with pt Rev low sat fat diet in detail LDL down a bit Intol of statins  Stressed imp of low fat diet       Obese    Discussed how this problem influences overall health and the risks it imposes  Reviewed plan for weight loss with lower calorie diet (via better food choices and also portion control or program like weight watchers) and exercise building up to or more than 30 minutes 5 days per week including some aerobic activity   Pt refuses to exercise  Enc healthy diet with less processed food and sweets       Routine general medical examination at a health care facility - Primary    Reviewed health habits including diet and exercise and skin cancer prevention Reviewed appropriate screening tests for age  Also reviewed health mt list, fam hx and immunization status , as well as social and family history   amw rev See HPI Labs rev  Disc imp of diet Pt is not open to exercise  Was able to stop HRT Due for colonoscopy-she wants to put off until winter and will call when ready to schedule

## 2018-01-19 NOTE — Assessment & Plan Note (Signed)
Disc goals for lipids and reasons to control them Rev last labs with pt Rev low sat fat diet in detail LDL down a bit Intol of statins  Stressed imp of low fat diet

## 2018-01-19 NOTE — Assessment & Plan Note (Signed)
Seeing a new dermatologist upcoming

## 2018-01-19 NOTE — Assessment & Plan Note (Signed)
Reviewed health habits including diet and exercise and skin cancer prevention Reviewed appropriate screening tests for age  Also reviewed health mt list, fam hx and immunization status , as well as social and family history   amw rev See HPI Labs rev  Disc imp of diet Pt is not open to exercise  Was able to stop HRT Due for colonoscopy-she wants to put off until winter and will call when ready to schedule

## 2018-01-19 NOTE — Assessment & Plan Note (Signed)
Discussed how this problem influences overall health and the risks it imposes  Reviewed plan for weight loss with lower calorie diet (via better food choices and also portion control or program like weight watchers) and exercise building up to or more than 30 minutes 5 days per week including some aerobic activity   Pt refuses to exercise  Enc healthy diet with less processed food and sweets

## 2018-02-11 DIAGNOSIS — L814 Other melanin hyperpigmentation: Secondary | ICD-10-CM | POA: Diagnosis not present

## 2018-02-11 DIAGNOSIS — I788 Other diseases of capillaries: Secondary | ICD-10-CM | POA: Diagnosis not present

## 2018-02-11 DIAGNOSIS — L570 Actinic keratosis: Secondary | ICD-10-CM | POA: Diagnosis not present

## 2018-02-11 DIAGNOSIS — L309 Dermatitis, unspecified: Secondary | ICD-10-CM | POA: Diagnosis not present

## 2018-02-11 DIAGNOSIS — L820 Inflamed seborrheic keratosis: Secondary | ICD-10-CM | POA: Diagnosis not present

## 2018-02-16 DIAGNOSIS — E042 Nontoxic multinodular goiter: Secondary | ICD-10-CM | POA: Diagnosis not present

## 2018-02-16 DIAGNOSIS — E061 Subacute thyroiditis: Secondary | ICD-10-CM | POA: Diagnosis not present

## 2018-02-16 DIAGNOSIS — Q842 Other congenital malformations of hair: Secondary | ICD-10-CM | POA: Diagnosis not present

## 2018-02-16 DIAGNOSIS — N959 Unspecified menopausal and perimenopausal disorder: Secondary | ICD-10-CM | POA: Diagnosis not present

## 2018-02-16 DIAGNOSIS — E039 Hypothyroidism, unspecified: Secondary | ICD-10-CM | POA: Diagnosis not present

## 2018-02-20 DIAGNOSIS — M4696 Unspecified inflammatory spondylopathy, lumbar region: Secondary | ICD-10-CM | POA: Diagnosis not present

## 2018-02-20 DIAGNOSIS — M542 Cervicalgia: Secondary | ICD-10-CM | POA: Diagnosis not present

## 2018-02-23 DIAGNOSIS — E061 Subacute thyroiditis: Secondary | ICD-10-CM | POA: Diagnosis not present

## 2018-02-23 DIAGNOSIS — N959 Unspecified menopausal and perimenopausal disorder: Secondary | ICD-10-CM | POA: Diagnosis not present

## 2018-02-23 DIAGNOSIS — E039 Hypothyroidism, unspecified: Secondary | ICD-10-CM | POA: Diagnosis not present

## 2018-02-23 DIAGNOSIS — E042 Nontoxic multinodular goiter: Secondary | ICD-10-CM | POA: Diagnosis not present

## 2018-02-23 DIAGNOSIS — Q842 Other congenital malformations of hair: Secondary | ICD-10-CM | POA: Diagnosis not present

## 2018-05-05 DIAGNOSIS — Z1231 Encounter for screening mammogram for malignant neoplasm of breast: Secondary | ICD-10-CM | POA: Diagnosis not present

## 2018-05-06 DIAGNOSIS — L570 Actinic keratosis: Secondary | ICD-10-CM | POA: Diagnosis not present

## 2018-05-06 DIAGNOSIS — B009 Herpesviral infection, unspecified: Secondary | ICD-10-CM | POA: Diagnosis not present

## 2018-05-06 DIAGNOSIS — L578 Other skin changes due to chronic exposure to nonionizing radiation: Secondary | ICD-10-CM | POA: Diagnosis not present

## 2018-05-06 DIAGNOSIS — L309 Dermatitis, unspecified: Secondary | ICD-10-CM | POA: Diagnosis not present

## 2018-05-06 DIAGNOSIS — I788 Other diseases of capillaries: Secondary | ICD-10-CM | POA: Diagnosis not present

## 2018-05-12 DIAGNOSIS — H524 Presbyopia: Secondary | ICD-10-CM | POA: Diagnosis not present

## 2018-05-12 DIAGNOSIS — H52221 Regular astigmatism, right eye: Secondary | ICD-10-CM | POA: Diagnosis not present

## 2018-05-12 DIAGNOSIS — H25099 Other age-related incipient cataract, unspecified eye: Secondary | ICD-10-CM | POA: Diagnosis not present

## 2018-05-12 DIAGNOSIS — H5203 Hypermetropia, bilateral: Secondary | ICD-10-CM | POA: Diagnosis not present

## 2018-05-20 DIAGNOSIS — D100 Benign neoplasm of lip: Secondary | ICD-10-CM | POA: Diagnosis not present

## 2018-05-21 ENCOUNTER — Encounter: Payer: Self-pay | Admitting: Certified Nurse Midwife

## 2018-06-09 DIAGNOSIS — D100 Benign neoplasm of lip: Secondary | ICD-10-CM | POA: Diagnosis not present

## 2018-06-17 DIAGNOSIS — L570 Actinic keratosis: Secondary | ICD-10-CM | POA: Diagnosis not present

## 2018-08-05 DIAGNOSIS — M180 Bilateral primary osteoarthritis of first carpometacarpal joints: Secondary | ICD-10-CM | POA: Diagnosis not present

## 2018-08-05 DIAGNOSIS — M65312 Trigger thumb, left thumb: Secondary | ICD-10-CM | POA: Diagnosis not present

## 2018-08-13 DIAGNOSIS — R21 Rash and other nonspecific skin eruption: Secondary | ICD-10-CM | POA: Diagnosis not present

## 2018-08-13 DIAGNOSIS — L820 Inflamed seborrheic keratosis: Secondary | ICD-10-CM | POA: Diagnosis not present

## 2018-08-13 DIAGNOSIS — L570 Actinic keratosis: Secondary | ICD-10-CM | POA: Diagnosis not present

## 2018-08-13 DIAGNOSIS — L720 Epidermal cyst: Secondary | ICD-10-CM | POA: Diagnosis not present

## 2018-08-13 DIAGNOSIS — L739 Follicular disorder, unspecified: Secondary | ICD-10-CM | POA: Diagnosis not present

## 2018-08-13 DIAGNOSIS — I788 Other diseases of capillaries: Secondary | ICD-10-CM | POA: Diagnosis not present

## 2018-09-02 DIAGNOSIS — L309 Dermatitis, unspecified: Secondary | ICD-10-CM | POA: Diagnosis not present

## 2018-09-02 DIAGNOSIS — L820 Inflamed seborrheic keratosis: Secondary | ICD-10-CM | POA: Diagnosis not present

## 2018-09-02 DIAGNOSIS — L821 Other seborrheic keratosis: Secondary | ICD-10-CM | POA: Diagnosis not present

## 2018-09-16 DIAGNOSIS — M65312 Trigger thumb, left thumb: Secondary | ICD-10-CM | POA: Diagnosis not present

## 2018-09-24 DIAGNOSIS — L570 Actinic keratosis: Secondary | ICD-10-CM | POA: Diagnosis not present

## 2018-09-24 DIAGNOSIS — Z85828 Personal history of other malignant neoplasm of skin: Secondary | ICD-10-CM | POA: Diagnosis not present

## 2018-09-24 DIAGNOSIS — L821 Other seborrheic keratosis: Secondary | ICD-10-CM | POA: Diagnosis not present

## 2018-09-24 DIAGNOSIS — L700 Acne vulgaris: Secondary | ICD-10-CM | POA: Diagnosis not present

## 2018-09-24 DIAGNOSIS — L820 Inflamed seborrheic keratosis: Secondary | ICD-10-CM | POA: Diagnosis not present

## 2018-11-18 DIAGNOSIS — M65312 Trigger thumb, left thumb: Secondary | ICD-10-CM | POA: Diagnosis not present

## 2018-12-23 ENCOUNTER — Telehealth: Payer: Self-pay | Admitting: Rheumatology

## 2018-12-23 DIAGNOSIS — M359 Systemic involvement of connective tissue, unspecified: Secondary | ICD-10-CM

## 2018-12-23 DIAGNOSIS — Z8639 Personal history of other endocrine, nutritional and metabolic disease: Secondary | ICD-10-CM

## 2018-12-23 NOTE — Telephone Encounter (Signed)
Please order CBC, CMP, UA, sed rate, ANA, ENA, C3-C4, vitamin D

## 2018-12-23 NOTE — Telephone Encounter (Signed)
Patient called back asking if Vitamin D could also be added to her labwork.

## 2018-12-23 NOTE — Telephone Encounter (Signed)
Patient called requesting labwork orders be sent to [place] on [address].  Patient states she plans to go on Tuesday 12/29/18.

## 2018-12-24 NOTE — Telephone Encounter (Signed)
Lab orders placed and released the,. Left message to advise patient.

## 2018-12-29 DIAGNOSIS — Z8639 Personal history of other endocrine, nutritional and metabolic disease: Secondary | ICD-10-CM | POA: Diagnosis not present

## 2018-12-29 DIAGNOSIS — M359 Systemic involvement of connective tissue, unspecified: Secondary | ICD-10-CM | POA: Diagnosis not present

## 2018-12-30 ENCOUNTER — Telehealth: Payer: Self-pay

## 2018-12-30 DIAGNOSIS — L819 Disorder of pigmentation, unspecified: Secondary | ICD-10-CM | POA: Diagnosis not present

## 2018-12-30 DIAGNOSIS — L578 Other skin changes due to chronic exposure to nonionizing radiation: Secondary | ICD-10-CM | POA: Diagnosis not present

## 2018-12-30 DIAGNOSIS — L820 Inflamed seborrheic keratosis: Secondary | ICD-10-CM | POA: Diagnosis not present

## 2018-12-30 DIAGNOSIS — D692 Other nonthrombocytopenic purpura: Secondary | ICD-10-CM | POA: Diagnosis not present

## 2018-12-30 DIAGNOSIS — L308 Other specified dermatitis: Secondary | ICD-10-CM | POA: Diagnosis not present

## 2018-12-30 DIAGNOSIS — D1800 Hemangioma unspecified site: Secondary | ICD-10-CM | POA: Diagnosis not present

## 2018-12-30 DIAGNOSIS — L814 Other melanin hyperpigmentation: Secondary | ICD-10-CM | POA: Diagnosis not present

## 2018-12-30 DIAGNOSIS — D485 Neoplasm of uncertain behavior of skin: Secondary | ICD-10-CM | POA: Diagnosis not present

## 2018-12-30 DIAGNOSIS — L821 Other seborrheic keratosis: Secondary | ICD-10-CM | POA: Diagnosis not present

## 2018-12-30 DIAGNOSIS — Z85828 Personal history of other malignant neoplasm of skin: Secondary | ICD-10-CM | POA: Diagnosis not present

## 2018-12-30 DIAGNOSIS — L570 Actinic keratosis: Secondary | ICD-10-CM | POA: Diagnosis not present

## 2018-12-30 DIAGNOSIS — Z1283 Encounter for screening for malignant neoplasm of skin: Secondary | ICD-10-CM | POA: Diagnosis not present

## 2018-12-30 NOTE — Telephone Encounter (Signed)
Pt left v/m that she had lab done for Dr Estanislado Pandy and urinalysis showed trace of leukocyte and advised pt to f/u with PCP. Pt wants to know if needs appt or not. Pt did not answer phone and I left v/m for pt to call [facility] back for additional info on symptoms and will proceed from there.

## 2018-12-31 NOTE — Telephone Encounter (Signed)
Pt notified of Dr. Tower's instructions and verbalized understanding  

## 2018-12-31 NOTE — Telephone Encounter (Signed)
Drink lots of fluids and watch for uti symptoms (like dysuria or frequency or urgency) -follow up if she has any symptoms  The trace of wbc may be from contamination

## 2018-12-31 NOTE — Telephone Encounter (Signed)
I spoke with pt;no urinary symptoms and no abd pain and no back pain. 20 yrs ago had one UTI. Since U/A only showed trace of leukocytes and no symptoms does pt need appt or can she just watch for any symptoms for UTI to arise.No covid symptoms,no known exposure to covid or flu. Went to beach in [facility] for 2 wks and returned home on 12/18/18. Pt request cb.

## 2019-01-01 LAB — CBC WITH DIFFERENTIAL/PLATELET
Absolute Monocytes: 753 cells/uL (ref 200–950)
Basophils Absolute: 21 cells/uL (ref 0–200)
Basophils Relative: 0.4 %
Eosinophils Absolute: 90 cells/uL (ref 15–500)
Eosinophils Relative: 1.7 %
HCT: 40.7 % (ref 35.0–45.0)
Hemoglobin: 13.3 g/dL (ref 11.7–15.5)
Lymphs Abs: 1601 cells/uL (ref 850–3900)
MCH: 29.8 pg (ref 27.0–33.0)
MCHC: 32.7 g/dL (ref 32.0–36.0)
MCV: 91.3 fL (ref 80.0–100.0)
MPV: 9.7 fL (ref 7.5–12.5)
Monocytes Relative: 14.2 %
Neutro Abs: 2836 cells/uL (ref 1500–7800)
Neutrophils Relative %: 53.5 %
Platelets: 234 10*3/uL (ref 140–400)
RBC: 4.46 10*6/uL (ref 3.80–5.10)
RDW: 13 % (ref 11.0–15.0)
Total Lymphocyte: 30.2 %
WBC: 5.3 10*3/uL (ref 3.8–10.8)

## 2019-01-01 LAB — URINALYSIS, ROUTINE W REFLEX MICROSCOPIC
Bacteria, UA: NONE SEEN /HPF
Bilirubin Urine: NEGATIVE
Glucose, UA: NEGATIVE
Hgb urine dipstick: NEGATIVE
Hyaline Cast: NONE SEEN /LPF
Ketones, ur: NEGATIVE
Nitrite: NEGATIVE
Protein, ur: NEGATIVE
RBC / HPF: NONE SEEN /HPF (ref 0–2)
Specific Gravity, Urine: 1.024 (ref 1.001–1.03)
pH: 5.5 (ref 5.0–8.0)

## 2019-01-01 LAB — SJOGRENS SYNDROME-B EXTRACTABLE NUCLEAR ANTIBODY: SSB (La) (ENA) Antibody, IgG: <1 AI

## 2019-01-01 LAB — ANTI-SCLERODERMA ANTIBODY: Scleroderma (Scl-70) (ENA) Antibody, IgG: <1 AI

## 2019-01-01 LAB — COMPLETE METABOLIC PANEL WITH GFR
AG Ratio: 2.6 (calc) — ABNORMAL HIGH (ref 1.0–2.5)
ALT: 16 U/L (ref 6–29)
AST: 20 U/L (ref 10–35)
Albumin: 4.5 g/dL (ref 3.6–5.1)
Alkaline phosphatase (APISO): 94 U/L (ref 37–153)
BUN: 15 mg/dL (ref 7–25)
CO2: 29 mmol/L (ref 20–32)
Calcium: 9.5 mg/dL (ref 8.6–10.4)
Chloride: 105 mmol/L (ref 98–110)
Creat: 0.76 mg/dL (ref 0.60–0.93)
GFR, Est African American: 91 mL/min/{1.73_m2} (ref >=60)
GFR, Est Non African American: 78 mL/min/{1.73_m2} (ref >=60)
Globulin: 1.7 g/dL (calc) — ABNORMAL LOW (ref 1.9–3.7)
Glucose, Bld: 77 mg/dL (ref 65–99)
Potassium: 4 mmol/L (ref 3.5–5.3)
Sodium: 142 mmol/L (ref 135–146)
Total Bilirubin: 0.4 mg/dL (ref 0.2–1.2)
Total Protein: 6.2 g/dL (ref 6.1–8.1)

## 2019-01-01 LAB — SJOGRENS SYNDROME-A EXTRACTABLE NUCLEAR ANTIBODY: SSA (Ro) (ENA) Antibody, IgG: <1 AI

## 2019-01-01 LAB — RNP ANTIBODY: Ribonucleic Protein(ENA) Antibody, IgG: <1 AI

## 2019-01-01 LAB — ANA: Anti Nuclear Antibody (ANA): NEGATIVE

## 2019-01-01 LAB — C3 AND C4
C3 Complement: 126 mg/dL (ref 83–193)
C4 Complement: 20 mg/dL (ref 15–57)

## 2019-01-01 LAB — ANTI-DNA ANTIBODY, DOUBLE-STRANDED: ds DNA Ab: 1 IU/mL

## 2019-01-01 LAB — ANTI-SMITH ANTIBODY: ENA SM Ab Ser-aCnc: <1 AI

## 2019-01-01 LAB — VITAMIN D 25 HYDROXY (VIT D DEFICIENCY, FRACTURES): Vit D, 25-Hydroxy: 54 ng/mL (ref 30–100)

## 2019-01-01 LAB — SEDIMENTATION RATE: Sed Rate: 2 mm/h (ref 0–30)

## 2019-01-05 ENCOUNTER — Encounter: Payer: Self-pay | Admitting: Rheumatology

## 2019-01-05 ENCOUNTER — Other Ambulatory Visit: Payer: Self-pay

## 2019-01-05 ENCOUNTER — Ambulatory Visit: Payer: PPO | Admitting: Rheumatology

## 2019-01-05 VITALS — BP 127/77 | HR 54 | Resp 13 | Ht 65.0 in | Wt 181.0 lb

## 2019-01-05 DIAGNOSIS — L280 Lichen simplex chronicus: Secondary | ICD-10-CM

## 2019-01-05 DIAGNOSIS — L309 Dermatitis, unspecified: Secondary | ICD-10-CM | POA: Diagnosis not present

## 2019-01-05 DIAGNOSIS — M19071 Primary osteoarthritis, right ankle and foot: Secondary | ICD-10-CM | POA: Diagnosis not present

## 2019-01-05 DIAGNOSIS — Z8719 Personal history of other diseases of the digestive system: Secondary | ICD-10-CM | POA: Diagnosis not present

## 2019-01-05 DIAGNOSIS — M51369 Other intervertebral disc degeneration, lumbar region without mention of lumbar back pain or lower extremity pain: Secondary | ICD-10-CM

## 2019-01-05 DIAGNOSIS — M19041 Primary osteoarthritis, right hand: Secondary | ICD-10-CM | POA: Diagnosis not present

## 2019-01-05 DIAGNOSIS — M19042 Primary osteoarthritis, left hand: Secondary | ICD-10-CM | POA: Diagnosis not present

## 2019-01-05 DIAGNOSIS — E042 Nontoxic multinodular goiter: Secondary | ICD-10-CM

## 2019-01-05 DIAGNOSIS — M19072 Primary osteoarthritis, left ankle and foot: Secondary | ICD-10-CM

## 2019-01-05 DIAGNOSIS — Z8639 Personal history of other endocrine, nutritional and metabolic disease: Secondary | ICD-10-CM | POA: Diagnosis not present

## 2019-01-05 DIAGNOSIS — M5030 Other cervical disc degeneration, unspecified cervical region: Secondary | ICD-10-CM | POA: Diagnosis not present

## 2019-01-05 DIAGNOSIS — M170 Bilateral primary osteoarthritis of knee: Secondary | ICD-10-CM | POA: Diagnosis not present

## 2019-01-05 DIAGNOSIS — M5136 Other intervertebral disc degeneration, lumbar region: Secondary | ICD-10-CM

## 2019-01-05 MED ORDER — DICLOFENAC SODIUM 1 % TD GEL: TRANSDERMAL | 3 refills | Status: DC

## 2019-01-05 NOTE — Progress Notes (Signed)
Office Visit Note  Patient: Morgan [name]             Date of Birth: 02-01-1946           MRN: [id number]             PCP: Abner Greenspan, MD Referring: Tower, Wynelle Fanny, MD Visit Date: 01/05/2019 Occupation: _0 @  Subjective:  Pain in both knees.   History of Present Illness: Morgan [name] is a 73 y.o. female with history of osteoarthritis.  She states she continues to have pain and discomfort in her bilateral hands and her knee joints.  She states she has intermittent severe pain and discomfort in her knees and that it comes down.  She has not noticed any knee swelling.  She also has some discomfort in her feet which is tolerable.  She denies any history of oral ulcers nasal ulcers malar rash photosensitivity or Raynaud's phenomenon.  Activities of Daily Living:  Patient reports morning stiffness for 30 minutes.   Patient Reports nocturnal pain.  Difficulty dressing/grooming: Denies Difficulty climbing stairs: Denies Difficulty getting out of chair: Denies Difficulty using hands for taps, buttons, cutlery, and/or writing: Denies  Review of Systems  Constitutional: Negative for fatigue, night sweats, weight gain and weight loss.  HENT: Negative for mouth sores, nosebleeds, trouble swallowing, trouble swallowing, mouth dryness and nose dryness.   Eyes: Negative for pain, redness, itching, visual disturbance and dryness.  Respiratory: Negative for cough, shortness of breath, wheezing and difficulty breathing.   Cardiovascular: Negative for chest pain, palpitations, hypertension, irregular heartbeat and swelling in legs/feet.  Gastrointestinal: Positive for constipation. Negative for abdominal pain, blood in stool and diarrhea.  Endocrine: Negative for increased urination.  Genitourinary: Negative for painful urination, pelvic pain and vaginal dryness.  Musculoskeletal: Positive for arthralgias, joint pain and morning stiffness. Negative for joint swelling, myalgias, muscle  weakness, muscle tenderness and myalgias.  Skin: Negative for color change, rash, hair loss, skin tightness, ulcers and sensitivity to sunlight.  Allergic/Immunologic: Negative for susceptible to infections.  Neurological: Negative for dizziness, headaches, memory loss, night sweats and weakness.  Hematological: Positive for bruising/bleeding tendency. Negative for swollen glands.  Psychiatric/Behavioral: Negative for depressed mood, confusion and sleep disturbance. The patient is not nervous/anxious.     PMFS History:  Patient Active Problem List   Diagnosis Date Noted  . Snoring 08/06/2017  . Nasal congestion 08/06/2017  . Posterior right knee pain 11/22/2016  . Paresthesia of foot, bilateral 08/12/2016  . Right hand pain 08/12/2016  . Encounter for Medicare annual wellness exam 04/13/2014  . Allergic rhinitis 04/13/2014  . Routine general medical examination at a health care facility 04/07/2014  . Multiple thyroid nodules 01/24/2014  . History of small bowel obstruction 01/20/2014  . Low back strain 01/07/2014  . DDD (degenerative disc disease), cervical   . DDD (degenerative disc disease), lumbar   . Spinal stenosis   . Obese   . TINNITUS 06/08/2010  . NONSPECIFIC ABNORMAL TOXICOLOGICAL FINDINGS 05/11/2010  . Hypothyroidism 04/18/2010  . BRADYCARDIA 05/31/2008  . GERD 05/03/2008  . DYSPEPSIA 05/03/2008  . DIVERTICULOSIS OF COLON 04/26/2008  . HEPATITIS, HX OF 04/26/2008  . COLONIC POLYPS, HYPERPLASTIC, HX OF 04/26/2008  . HEMORRHOIDS, INTERNAL 01/11/2008  . DERMATITIS, SEBORRHEIC NEC 01/28/2007  . Hyperlipidemia 01/19/2007  . IBS 01/19/2007  . OVERACTIVE BLADDER 01/19/2007  . POSTMENOPAUSAL STATUS 01/19/2007  . ECZEMA 01/19/2007  . OSTEOARTHRITIS 01/19/2007  . ALLERGY 01/19/2007    Past Medical History:  Diagnosis  Date  . Allergy   . Bradycardia   . Connective tissue disease ([place])   . DDD (degenerative disc disease), cervical   . DDD (degenerative disc disease),  lumbar   . Endometrial polyp    not resolved with D&C (following)  . GERD (gastroesophageal reflux disease)    gas/belching issues-was on protonix for short time-not since 6 months  . HLD (hyperlipidemia)   . Mixed connective tissue disease ([place]) diagnosed 5 years ago   no problems since diagnosis  . Multiple thyroid nodules 01/24/2014  . Obesity   . Osteoarthritis of cervical spine   . PONV (postoperative nausea and vomiting)   . Renal calculi   . Spinal stenosis     Family History  Problem Relation Age of Onset  . Coronary artery disease Father   . Heart attack Father        deceased at 31  . Dementia Mother   . Diabetes Maternal Grandmother   . Goiter Maternal Grandmother   . Diabetes Cousin   . Cancer Grandchild        rare type (dying at age 67)  . Colon cancer Neg Hx   . Esophageal cancer Neg Hx   . Stomach cancer Neg Hx   . Rectal cancer Neg Hx    Past Surgical History:  Procedure Laterality Date  . APPENDECTOMY    . COLONOSCOPY    . DILATION AND CURETTAGE OF UTERUS     fibroids  . DILATION AND CURETTAGE OF UTERUS  01/2005   endometrial polyp  . ENDOMETRIAL BIOPSY  2008   fibroid  . HAND SURGERY     carpal tunnel release bilateral Dr. Daylene Katayama  . hysteroscopic resection      polyps of PMB  . TONSILLECTOMY    . TUBAL LIGATION     1973  . UPPER GASTROINTESTINAL ENDOSCOPY     Social History   Social History Narrative   + caffeine use     Immunization History  Administered Date(s) Administered  . Influenza,inj,Quad PF,6+ Mos 04/13/2014, 09/22/2015, 08/12/2016, 08/06/2017  . Pneumococcal Conjugate-13 04/13/2014  . Pneumococcal Polysaccharide-23 09/15/2012  . Td 11/07/1997, 01/28/2007  . Tdap 05/28/2017  . Zoster 09/09/2006     Objective: Vital Signs: BP 127/77 (BP Location: Left Arm, Patient Position: Sitting, Cuff Size: Normal)   Pulse (!) 54   Resp 13   Ht _0  (1.651 m)   Wt 181 lb (82.1 kg)   LMP 07/22/1989 (Approximate)   BMI 30.12 kg/m     Physical Exam Vitals signs and nursing note reviewed.  Constitutional:      Appearance: She is well-developed.  HENT:     Head: Normocephalic and atraumatic.  Eyes:     Conjunctiva/sclera: Conjunctivae normal.  Neck:     Musculoskeletal: Normal range of motion.  Cardiovascular:     Rate and Rhythm: Normal rate and regular rhythm.     Heart sounds: Normal heart sounds.  Pulmonary:     Effort: Pulmonary effort is normal.     Breath sounds: Normal breath sounds.  Abdominal:     General: Bowel sounds are normal.     Palpations: Abdomen is soft.  Lymphadenopathy:     Cervical: No cervical adenopathy.  Skin:    General: Skin is warm and dry.     Capillary Refill: Capillary refill takes less than 2 seconds.  Neurological:     Mental Status: She is alert and oriented to person, place, and time.  Psychiatric:  Behavior: Behavior normal.      Musculoskeletal Exam: C-spine thoracic and lumbar spine is in good range of motion.  Shoulder joints elbow joints wrist joints were in good range of motion.  She had bilateral PIP and DIP thickening with some of the DIP joint subluxation.  No synovitis was noted.  Hip joints with good range of motion.  She good range of motion of bilateral knee joints without any warmth swelling or effusion.  She had bilateral pes cavus with DIP and PIP thickening.  CDAI Exam: CDAI Score: - Patient Global: -; Provider Global: - Swollen: -; Tender: - Joint Exam   No joint exam has been documented for this visit   There is currently no information documented on the homunculus. Go to the Rheumatology activity and complete the homunculus joint exam.  Investigation: No additional findings.  Imaging: No results found.  Recent Labs: Lab Results  Component Value Date   WBC 5.3 12/29/2018   HGB 13.3 12/29/2018   PLT 234 12/29/2018   NA 142 12/29/2018   K 4.0 12/29/2018   CL 105 12/29/2018   CO2 29 12/29/2018   GLUCOSE 77 12/29/2018   BUN 15  12/29/2018   CREATININE 0.76 12/29/2018   BILITOT 0.4 12/29/2018   ALKPHOS 83 01/06/2018   AST 20 12/29/2018   ALT 16 12/29/2018   PROT 6.2 12/29/2018   ALBUMIN 4.5 01/06/2018   CALCIUM 9.5 12/29/2018   GFRAA 91 12/29/2018  ANA negative, ENA negative, C3-C4 normal, ESR 2, vitamin D 54 UA positive for trace leukocytes.  Speciality Comments: No specialty comments available.  Procedures:  No procedures performed Allergies: Codeine, Hydrocodone-acetaminophen, and Penicillins   Assessment / Plan:     Visit Diagnoses: Primary osteoarthritis of both hands -patient has severe osteoarthritis in her hands.  Joint protection muscle strengthening was discussed.  She has no synovitis.   Primary osteoarthritis of both knees -patient has moderate osteoarthritis in her bilateral knee joints.  She has intermittent increased pain in her knee joints.  I discussed the option of cortisone injection or [place] supplement injections.  She wants to hold off for right now.  A handout on knee exercises was given.  I have also given her prescription for Voltaren gel which can be used topically.  Side effects were discussed.  If her symptoms get worse she will contact us.  Weight loss was discussed as well.  Primary osteoarthritis of both feet -she has bilateral pes cavus and osteoarthritis in her feet.  Proper fitting shoes were discussed.    DDD (degenerative disc disease), cervical -she is currently not having much discomfort.  DDD (degenerative disc disease), lumbar -pain is manageable.  History of positive ANA-patient had positive ANA in the past and some other antibodies.  She was diagnosed with mixed connective tissue disease and was on DMARDs in the past by Dr. Zenovia Jordan.  Since have seen her 2 sets of autoimmune labs were all negative.  I have removed the diagnosis from her problem list.  She will contact me if she develops any new symptoms.  History of vitamin D deficiency -she is on supplement.   Lichenoid dermatitis - Plan: Followed up by dermatologist.  Eczema, unspecified type -followed up by dermatologist.  History of small bowel obstruction  Multiple thyroid nodules   History of gastroesophageal reflux (GERD)   Orders: No orders of the defined types were placed in this encounter.  Meds ordered this encounter  Medications  . diclofenac sodium (VOLTAREN) 1 %  GEL    Sig: 3 grams to 3 large joints up to 3 times daily    Dispense:  3 Tube    Refill:  3     Follow-Up Instructions: Return in about 6 months (around 07/07/2019).   Bo Merino, MD  Note - This record has been created using Editor, commissioning.  Chart creation errors have been sought, but may not always  have been located. Such creation errors do not reflect on  the standard of medical care.

## 2019-01-05 NOTE — Patient Instructions (Signed)

## 2019-01-06 DIAGNOSIS — M180 Bilateral primary osteoarthritis of first carpometacarpal joints: Secondary | ICD-10-CM | POA: Diagnosis not present

## 2019-01-06 DIAGNOSIS — M65312 Trigger thumb, left thumb: Secondary | ICD-10-CM | POA: Diagnosis not present

## 2019-01-07 DIAGNOSIS — L300 Nummular dermatitis: Secondary | ICD-10-CM | POA: Diagnosis not present

## 2019-01-07 DIAGNOSIS — L820 Inflamed seborrheic keratosis: Secondary | ICD-10-CM | POA: Diagnosis not present

## 2019-01-07 DIAGNOSIS — Z4802 Encounter for removal of sutures: Secondary | ICD-10-CM | POA: Diagnosis not present

## 2019-01-28 DIAGNOSIS — E039 Hypothyroidism, unspecified: Secondary | ICD-10-CM | POA: Diagnosis not present

## 2019-01-28 DIAGNOSIS — E061 Subacute thyroiditis: Secondary | ICD-10-CM | POA: Diagnosis not present

## 2019-01-28 DIAGNOSIS — Q842 Other congenital malformations of hair: Secondary | ICD-10-CM | POA: Diagnosis not present

## 2019-01-28 DIAGNOSIS — N959 Unspecified menopausal and perimenopausal disorder: Secondary | ICD-10-CM | POA: Diagnosis not present

## 2019-01-28 DIAGNOSIS — E6609 Other obesity due to excess calories: Secondary | ICD-10-CM | POA: Diagnosis not present

## 2019-01-28 DIAGNOSIS — E042 Nontoxic multinodular goiter: Secondary | ICD-10-CM | POA: Diagnosis not present

## 2019-02-01 ENCOUNTER — Telehealth: Payer: Self-pay | Admitting: Family Medicine

## 2019-02-01 DIAGNOSIS — E7800 Pure hypercholesterolemia, unspecified: Secondary | ICD-10-CM

## 2019-02-01 DIAGNOSIS — Z0000 Encounter for general adult medical examination without abnormal findings: Secondary | ICD-10-CM

## 2019-02-01 DIAGNOSIS — E039 Hypothyroidism, unspecified: Secondary | ICD-10-CM

## 2019-02-01 NOTE — Telephone Encounter (Signed)
-----   Message from Cloyd Stagers, RT sent at 01/29/2019 10:15 AM EDT ----- Regarding: Lab orders for Tuesday 7.14.2020 Please place lab orders for Tuesday 7.14.2020, office visit for physical on Tuesday 7.21.2020 Thank you, Dyke Maes RT(R)

## 2019-02-02 ENCOUNTER — Ambulatory Visit: Payer: PPO

## 2019-02-02 ENCOUNTER — Other Ambulatory Visit (INDEPENDENT_AMBULATORY_CARE_PROVIDER_SITE_OTHER): Payer: PPO

## 2019-02-02 DIAGNOSIS — Z0000 Encounter for general adult medical examination without abnormal findings: Secondary | ICD-10-CM | POA: Diagnosis not present

## 2019-02-02 DIAGNOSIS — E039 Hypothyroidism, unspecified: Secondary | ICD-10-CM

## 2019-02-02 DIAGNOSIS — E7800 Pure hypercholesterolemia, unspecified: Secondary | ICD-10-CM

## 2019-02-02 LAB — COMPREHENSIVE METABOLIC PANEL
ALT: 16 U/L (ref 0–35)
AST: 20 U/L (ref 0–37)
Albumin: 4.3 g/dL (ref 3.5–5.2)
Alkaline Phosphatase: 90 U/L (ref 39–117)
BUN: 14 mg/dL (ref 6–23)
CO2: 30 mEq/L (ref 19–32)
Calcium: 9.2 mg/dL (ref 8.4–10.5)
Chloride: 106 mEq/L (ref 96–112)
Creatinine, Ser: 0.7 mg/dL (ref 0.40–1.20)
GFR: 82.14 mL/min (ref >60.00)
Glucose, Bld: 82 mg/dL (ref 70–99)
Potassium: 4.6 mEq/L (ref 3.5–5.1)
Sodium: 141 mEq/L (ref 135–145)
Total Bilirubin: 0.5 mg/dL (ref 0.2–1.2)
Total Protein: 6.4 g/dL (ref 6.0–8.3)

## 2019-02-02 LAB — LIPID PANEL
Cholesterol: 202 mg/dL — ABNORMAL HIGH (ref 0–200)
HDL: 50.3 mg/dL (ref >39.00)
LDL Cholesterol: 135 mg/dL — ABNORMAL HIGH (ref 0–99)
NonHDL: 151.95
Total CHOL/HDL Ratio: 4
Triglycerides: 86 mg/dL (ref 0.0–149.0)
VLDL: 17.2 mg/dL (ref 0.0–40.0)

## 2019-02-02 LAB — CBC WITH DIFFERENTIAL/PLATELET
Basophils Absolute: 0 10*3/uL (ref 0.0–0.1)
Basophils Relative: 0.2 % (ref 0.0–3.0)
Eosinophils Absolute: 0.1 10*3/uL (ref 0.0–0.7)
Eosinophils Relative: 2.2 % (ref 0.0–5.0)
HCT: 38.7 % (ref 36.0–46.0)
Hemoglobin: 13 g/dL (ref 12.0–15.0)
Lymphocytes Relative: 34 % (ref 12.0–46.0)
Lymphs Abs: 1.6 10*3/uL (ref 0.7–4.0)
MCHC: 33.5 g/dL (ref 30.0–36.0)
MCV: 90.3 fl (ref 78.0–100.0)
Monocytes Absolute: 0.7 10*3/uL (ref 0.1–1.0)
Monocytes Relative: 15.6 % — ABNORMAL HIGH (ref 3.0–12.0)
Neutro Abs: 2.2 10*3/uL (ref 1.4–7.7)
Neutrophils Relative %: 48 % (ref 43.0–77.0)
Platelets: 216 10*3/uL (ref 150.0–400.0)
RBC: 4.29 Mil/uL (ref 3.87–5.11)
RDW: 13.3 % (ref 11.5–15.5)
WBC: 4.6 10*3/uL (ref 4.0–10.5)

## 2019-02-02 LAB — TSH: TSH: 0.92 u[IU]/mL (ref 0.35–4.50)

## 2019-02-03 DIAGNOSIS — E039 Hypothyroidism, unspecified: Secondary | ICD-10-CM | POA: Diagnosis not present

## 2019-02-03 DIAGNOSIS — E061 Subacute thyroiditis: Secondary | ICD-10-CM | POA: Diagnosis not present

## 2019-02-03 DIAGNOSIS — E6609 Other obesity due to excess calories: Secondary | ICD-10-CM | POA: Diagnosis not present

## 2019-02-03 DIAGNOSIS — Q842 Other congenital malformations of hair: Secondary | ICD-10-CM | POA: Diagnosis not present

## 2019-02-03 DIAGNOSIS — Z6830 Body mass index (BMI) 30.0-30.9, adult: Secondary | ICD-10-CM | POA: Diagnosis not present

## 2019-02-03 DIAGNOSIS — E042 Nontoxic multinodular goiter: Secondary | ICD-10-CM | POA: Diagnosis not present

## 2019-02-03 DIAGNOSIS — N959 Unspecified menopausal and perimenopausal disorder: Secondary | ICD-10-CM | POA: Diagnosis not present

## 2019-02-09 ENCOUNTER — Ambulatory Visit (INDEPENDENT_AMBULATORY_CARE_PROVIDER_SITE_OTHER): Payer: PPO | Admitting: Family Medicine

## 2019-02-09 ENCOUNTER — Encounter: Payer: Self-pay | Admitting: Family Medicine

## 2019-02-09 VITALS — Wt 180.0 lb

## 2019-02-09 DIAGNOSIS — E7800 Pure hypercholesterolemia, unspecified: Secondary | ICD-10-CM | POA: Diagnosis not present

## 2019-02-09 DIAGNOSIS — Z6830 Body mass index (BMI) 30.0-30.9, adult: Secondary | ICD-10-CM

## 2019-02-09 DIAGNOSIS — E039 Hypothyroidism, unspecified: Secondary | ICD-10-CM

## 2019-02-09 DIAGNOSIS — E6609 Other obesity due to excess calories: Secondary | ICD-10-CM

## 2019-02-09 DIAGNOSIS — E042 Nontoxic multinodular goiter: Secondary | ICD-10-CM | POA: Diagnosis not present

## 2019-02-09 DIAGNOSIS — Z1211 Encounter for screening for malignant neoplasm of colon: Secondary | ICD-10-CM | POA: Diagnosis not present

## 2019-02-09 DIAGNOSIS — M1991 Primary osteoarthritis, unspecified site: Secondary | ICD-10-CM

## 2019-02-09 DIAGNOSIS — Z0000 Encounter for general adult medical examination without abnormal findings: Secondary | ICD-10-CM

## 2019-02-09 NOTE — Assessment & Plan Note (Signed)
Discussed how this problem influences overall health and the risks it imposes  Reviewed plan for weight loss with lower calorie diet (via better food choices and also portion control or program like weight watchers) and exercise building up to or more than 30 minutes 5 days per week including some aerobic activity   Enc more exercise within the house if possible

## 2019-02-09 NOTE — Assessment & Plan Note (Signed)
Sees rheumatology (formerly diagnosed with mixed CT dz that that was d/c when labs came back nl)

## 2019-02-09 NOTE — Patient Instructions (Addendum)
Don't forget to get a flu shot in the fall   The office will call you to set up your colonoscopy   Try to stay active/exercise more within your home  Avoid crowds during this pandemic

## 2019-02-09 NOTE — Assessment & Plan Note (Signed)
Disc goals for lipids and reasons to control them Rev last labs with pt Rev low sat fat diet in detail LDL is still in 130s HDL good at 50 Statin intolerant  Enc good diet

## 2019-02-09 NOTE — Assessment & Plan Note (Signed)
Pt continues to see endocrinology/ Dr Felipa Evener  Lab Results  Component Value Date   TSH 0.92 02/02/2019   No clinical changes  Due for needle bx of some nodules as well

## 2019-02-09 NOTE — Assessment & Plan Note (Signed)
Reviewed health habits including diet and exercise and skin cancer prevention Reviewed appropriate screening tests for age  Also reviewed health mt list, fam hx and immunization status , as well as social and family history   She has amw from ins co-visiting nurse I ref for colonoscopy for adenoma  She will get flu shot in the fall See HPI Labs rev  Enc exercise Continue f/u endo for thyroid and needle bx

## 2019-02-09 NOTE — Assessment & Plan Note (Signed)
Due for 5 y recall for adenoma  Will place referral

## 2019-02-09 NOTE — Progress Notes (Signed)
Virtual Visit via Video Note  I connected with Morgan [name] on 02/09/19 at 11:30 AM EDT by a video enabled telemedicine application and verified that I am speaking with the correct person using two identifiers.  Location: Patient: home Provider: office    I discussed the limitations of evaluation and management by telemedicine and the availability of in person appointments. The patient expressed understanding and agreed to proceed.  History of Present Illness: Here for health maintenance exam and to review chronic medical problems Dealing well with the pandemic  Misses socialization   Feeling good  Taking good care of herself  Cooking/cleaning/ the usual schedule -wants to stay at home as long as she can  No big projects   Wt at home 180 lb Wt Readings from Last 3 Encounters:  01/05/19 181 lb (82.1 kg)  01/19/18 182 lb 12 oz (82.9 kg)  01/06/18 179 lb 4 oz (81.3 kg)  stable  Exercise- cleaning/housework -none more  Does eat very healthy   Her vitals at endocrine office did vitals -all ok/normal  Usual low HR   Colonoscopy 9/13 with 5y recall for adenoma  Wants to get that scheduled  Wants to see female doctor if possible   Flu vaccine 1/19 Tdap 11/18 zostavax 2/08 PNA vaccines complete  Mammogram 10/19  Self breast exam -no lumps or changes   dexa 11/18 -normal BMD No falls  No fractures  She takes vit D3 for bone health   (not ca due to h/o kidney stones) Her rheumatologist checks her levels - normal most recently  She also takes a pnv-has a little calcium in it    PHQ -2 score is 0  Mood is good overall    Hypothyroidism  Pt has no clinical changes No change in energy level/ hair or skin/ edema and no tremor Lab Results  Component Value Date   TSH 0.92 02/02/2019    Managed by endocrinology Also h/o multiple thyroid nodules - will be having needle bx soon  (last was 7 y ago)  No dose changes    Hyperlipidemia  Lab Results  Component Value  Date   CHOL 202 (H) 02/02/2019   CHOL 209 (H) 01/06/2018   CHOL 224 (H) 12/04/2016   Lab Results  Component Value Date   HDL 50.30 02/02/2019   HDL 50.00 01/06/2018   HDL 48.00 12/04/2016   Lab Results  Component Value Date   LDLCALC 135 (H) 02/02/2019   LDLCALC 136 (H) 01/06/2018   LDLCALC 149 (H) 12/04/2016   Lab Results  Component Value Date   TRIG 86.0 02/02/2019   TRIG 113.0 01/06/2018   TRIG 134.0 12/04/2016   Lab Results  Component Value Date   CHOLHDL 4 02/02/2019   CHOLHDL 4 01/06/2018   CHOLHDL 5 12/04/2016   Lab Results  Component Value Date   LDLDIRECT 148.8 10/09/2010   LDLDIRECT 166.4 08/09/2010   LDLDIRECT 144.5 01/29/2008    She is intolerant of statins  Stable profile No fried foods  Beef occasionally  She has occasional ice cream   Lab Results  Component Value Date   CREATININE 0.70 02/02/2019   BUN 14 02/02/2019   NA 141 02/02/2019   K 4.6 02/02/2019   CL 106 02/02/2019   CO2 30 02/02/2019   Lab Results  Component Value Date   ALT 16 02/02/2019   AST 20 02/02/2019   ALKPHOS 90 02/02/2019   BILITOT 0.5 02/02/2019    Lab Results  Component Value Date  WBC 4.6 02/02/2019   HGB 13.0 02/02/2019   HCT 38.7 02/02/2019   MCV 90.3 02/02/2019   PLT 216.0 02/02/2019   glucose 82    Rheumatology took the dx of mixed CT dz off her list She still has osteoarthritis of multiple sites  Patient Active Problem List   Diagnosis Date Noted  . Colon cancer screening 02/09/2019  . Snoring 08/06/2017  . Nasal congestion 08/06/2017  . Paresthesia of foot, bilateral 08/12/2016  . Encounter for Medicare annual wellness exam 04/13/2014  . Allergic rhinitis 04/13/2014  . Routine general medical examination at a health care facility 04/07/2014  . Multiple thyroid nodules 01/24/2014  . History of small bowel obstruction 01/20/2014  . Low back strain 01/07/2014  . DDD (degenerative disc disease), cervical   . DDD (degenerative disc disease),  lumbar   . Spinal stenosis   . Obese   . TINNITUS 06/08/2010  . NONSPECIFIC ABNORMAL TOXICOLOGICAL FINDINGS 05/11/2010  . Hypothyroidism 04/18/2010  . BRADYCARDIA 05/31/2008  . GERD 05/03/2008  . DYSPEPSIA 05/03/2008  . DIVERTICULOSIS OF COLON 04/26/2008  . HEPATITIS, HX OF 04/26/2008  . COLONIC POLYPS, HYPERPLASTIC, HX OF 04/26/2008  . HEMORRHOIDS, INTERNAL 01/11/2008  . DERMATITIS, SEBORRHEIC NEC 01/28/2007  . Hyperlipidemia 01/19/2007  . IBS 01/19/2007  . OVERACTIVE BLADDER 01/19/2007  . POSTMENOPAUSAL STATUS 01/19/2007  . ECZEMA 01/19/2007  . Osteoarthritis 01/19/2007  . ALLERGY 01/19/2007   Past Medical History:  Diagnosis Date  . Allergy   . Bradycardia   . Connective tissue disease ([place])   . DDD (degenerative disc disease), cervical   . DDD (degenerative disc disease), lumbar   . Endometrial polyp    not resolved with D&C (following)  . GERD (gastroesophageal reflux disease)    gas/belching issues-was on protonix for short time-not since 6 months  . HLD (hyperlipidemia)   . Mixed connective tissue disease ([place]) diagnosed 5 years ago   no problems since diagnosis  . Multiple thyroid nodules 01/24/2014  . Obesity   . Osteoarthritis of cervical spine   . PONV (postoperative nausea and vomiting)   . Renal calculi   . Spinal stenosis    Past Surgical History:  Procedure Laterality Date  . APPENDECTOMY    . COLONOSCOPY    . DILATION AND CURETTAGE OF UTERUS     fibroids  . DILATION AND CURETTAGE OF UTERUS  01/2005   endometrial polyp  . ENDOMETRIAL BIOPSY  2008   fibroid  . HAND SURGERY     carpal tunnel release bilateral Dr. Daylene Katayama  . hysteroscopic resection      polyps of PMB  . TONSILLECTOMY    . TUBAL LIGATION     1973  . UPPER GASTROINTESTINAL ENDOSCOPY     Social History   Tobacco Use  . Smoking status: Former Smoker    Types: Cigarettes    Quit date: 07/22/1965    Years since quitting: 53.5  . Smokeless tobacco: Never Used  Substance Use Topics   . Alcohol use: No  . Drug use: No   Family History  Problem Relation Age of Onset  . Coronary artery disease Father   . Heart attack Father        deceased at 76  . Dementia Mother   . Diabetes Maternal Grandmother   . Goiter Maternal Grandmother   . Diabetes Cousin   . Cancer Grandchild        rare type (dying at age 32)  . Colon cancer Neg Hx   .  Esophageal cancer Neg Hx   . Stomach cancer Neg Hx   . Rectal cancer Neg Hx    Allergies  Allergen Reactions  . Codeine Nausea And Vomiting  . Hydrocodone-Acetaminophen     REACTION: reaction not known  . Penicillins Hives   Current Outpatient Medications on File Prior to Visit  Medication Sig Dispense Refill  . azelastine (ASTELIN) 0.1 % nasal spray     . Cholecalciferol (VITAMIN D-3) 1000 units CAPS Take daily by mouth.    . Clobetasol Propionate Emulsion 0.05 % topical foam Apply 1 application topically 2 (two) times a week.     . diclofenac sodium (VOLTAREN) 1 % GEL 3 grams to 3 large joints up to 3 times daily 3 Tube 3  . fluticasone (FLONASE) 50 MCG/ACT nasal spray     . ibuprofen (ADVIL,MOTRIN) 200 MG tablet Take 400 mg by mouth as needed.    . Lactobacillus Rhamnosus, GG, (CULTURELLE PO) Take 1 tablet by mouth daily. Culturelle    . levothyroxine (SYNTHROID, LEVOTHROID) 50 MCG tablet Take 50 mcg 3 times a week (Mon, Wed, Fri),25 mcg 4 times a week (Tues, [place], Sat, Sun)    . polyethylene glycol (MIRALAX / GLYCOLAX) packet You may use this as needed for more acute constipation.  Follow label instructions. 14 each 0  . Prenatal Vit-Fe Fumarate-FA (PRENATAL PO) Take 1 tablet by mouth daily.    . valACYclovir (VALTREX) 1000 MG tablet 2 tablets twice daily for 1 day for fever blisters 30 tablet 6   No current facility-administered medications on file prior to visit.    Review of Systems  Constitutional: Negative for chills, fever, malaise/fatigue and weight loss.  HENT: Positive for tinnitus. Negative for congestion and  sore throat.   Eyes: Negative for blurred vision, discharge and redness.  Respiratory: Negative for cough and shortness of breath.   Cardiovascular: Negative for chest pain and palpitations.  Gastrointestinal: Negative for abdominal pain, heartburn, nausea and vomiting.  Genitourinary: Negative for hematuria.  Musculoskeletal: Positive for joint pain. Negative for myalgias.  Skin: Negative for itching and rash.  Neurological: Negative for dizziness and headaches.  Endo/Heme/Allergies: Negative for polydipsia.  Psychiatric/Behavioral: Negative for depression and memory loss. The patient is not nervous/anxious.      Observations/Objective: Patient appears well, in no distress Weight is baseline (obese)  No facial swelling or asymmetry Normal voice-not hoarse and no slurred speech No obvious tremor or mobility impairment Moving neck and UEs normally Able to hear the call well  No cough or shortness of breath during interview  Talkative and mentally sharp with no cognitive changes No skin changes on face or neck , no rash or pallor (fair complexion) Affect is normal (cheerful and talkative)    Assessment and Plan: Problem List Items Addressed This Visit      Endocrine   Hypothyroidism    Pt continues to see endocrinology/ Dr Felipa Evener  Lab Results  Component Value Date   TSH 0.92 02/02/2019   No clinical changes  Due for needle bx of some nodules as well      Multiple thyroid nodules    Pt is currently planning some needle bx of these with Dr Ronnald Collum        Musculoskeletal and Integument   Osteoarthritis    Sees rheumatology (formerly diagnosed with mixed CT dz that that was d/c when labs came back nl)        Other   Hyperlipidemia    Disc goals for lipids  and reasons to control them Rev last labs with pt Rev low sat fat diet in detail LDL is still in 130s HDL good at 50 Statin intolerant  Enc good diet      Obese    Discussed how this problem influences  overall health and the risks it imposes  Reviewed plan for weight loss with lower calorie diet (via better food choices and also portion control or program like weight watchers) and exercise building up to or more than 30 minutes 5 days per week including some aerobic activity   Enc more exercise within the house if possible      Routine general medical examination at a health care facility - Primary    Reviewed health habits including diet and exercise and skin cancer prevention Reviewed appropriate screening tests for age  Also reviewed health mt list, fam hx and immunization status , as well as social and family history   She has amw from ins co-visiting nurse I ref for colonoscopy for adenoma  She will get flu shot in the fall See HPI Labs rev  Enc exercise Continue f/u endo for thyroid and needle bx      Colon cancer screening    Due for 5 y recall for adenoma  Will place referral       Relevant Orders   Ambulatory referral to Gastroenterology       Follow Up Instructions: Don't forget to get a flu shot in the fall   The office will call you to set up your colonoscopy   Try to stay active/exercise more within your home  Avoid crowds during this pandemic    I discussed the assessment and treatment plan with the patient. The patient was provided an opportunity to ask questions and all were answered. The patient agreed with the plan and demonstrated an understanding of the instructions.   The patient was advised to call back or seek an in-person evaluation if the symptoms worsen or if the condition fails to improve as anticipated.     Loura Pardon, MD

## 2019-02-09 NOTE — Assessment & Plan Note (Signed)
Pt is currently planning some needle bx of these with Dr Ronnald Collum

## 2019-03-01 ENCOUNTER — Encounter: Payer: Self-pay | Admitting: Gastroenterology

## 2019-03-02 ENCOUNTER — Ambulatory Visit: Payer: PPO | Admitting: Family Medicine

## 2019-03-02 ENCOUNTER — Ambulatory Visit (INDEPENDENT_AMBULATORY_CARE_PROVIDER_SITE_OTHER): Payer: PPO | Admitting: Family Medicine

## 2019-03-02 ENCOUNTER — Encounter: Payer: Self-pay | Admitting: Family Medicine

## 2019-03-02 ENCOUNTER — Other Ambulatory Visit: Payer: Self-pay

## 2019-03-02 DIAGNOSIS — M542 Cervicalgia: Secondary | ICD-10-CM | POA: Diagnosis not present

## 2019-03-02 NOTE — Progress Notes (Signed)
Virtual Visit via Video Note  I connected with Morgan [name] on 03/02/19 at 12:00 PM EDT by a video enabled telemedicine application and verified that I am speaking with the correct person using two identifiers.  Location: Patient: home Provider: office    I discussed the limitations of evaluation and management by telemedicine and the availability of in person appointments. The patient expressed understanding and agreed to proceed.  Failed video today  Visit conducted by phone   History of Present Illness: Pt presents with neck pain   More problems lately  Went to orthopedics - Dr Phylliss Bob- did cervical films (a year ago_ Told she has arthritis in her CS Not surgical   Wants to know what is going on before she does any PT   Has not tried diclofenac gel Heat helps a bit  She has a cervical support pillow    7-10 days ago her neck started hurting worse  Radiates up into her head  Middle of neck=no worse on either side  Also some pain in TS- radiating up  She tried to push through it for several days    She used an upright vacuum on Friday for over 2 hours  Neck started hurting a lot worse  Took an ibuprofen- it helped some   No numbness or tingling or weakness in upper extremities   H/o cervical disc dz  (lumbar as well)-unsure when her last MRI was   She sees Dr Garen Grams for rheumatology for joint pain  She has widespread OA (hands and knees worst)   Patient Active Problem List   Diagnosis Date Noted  . Neck pain 03/02/2019  . Colon cancer screening 02/09/2019  . Snoring 08/06/2017  . Nasal congestion 08/06/2017  . Paresthesia of foot, bilateral 08/12/2016  . Encounter for Medicare annual wellness exam 04/13/2014  . Allergic rhinitis 04/13/2014  . Routine general medical examination at a health care facility 04/07/2014  . Multiple thyroid nodules 01/24/2014  . History of small bowel obstruction 01/20/2014  . Low back strain 01/07/2014  . DDD  (degenerative disc disease), cervical   . DDD (degenerative disc disease), lumbar   . Spinal stenosis   . Obese   . TINNITUS 06/08/2010  . NONSPECIFIC ABNORMAL TOXICOLOGICAL FINDINGS 05/11/2010  . Hypothyroidism 04/18/2010  . BRADYCARDIA 05/31/2008  . GERD 05/03/2008  . DYSPEPSIA 05/03/2008  . DIVERTICULOSIS OF COLON 04/26/2008  . HEPATITIS, HX OF 04/26/2008  . COLONIC POLYPS, HYPERPLASTIC, HX OF 04/26/2008  . HEMORRHOIDS, INTERNAL 01/11/2008  . DERMATITIS, SEBORRHEIC NEC 01/28/2007  . Hyperlipidemia 01/19/2007  . IBS 01/19/2007  . OVERACTIVE BLADDER 01/19/2007  . POSTMENOPAUSAL STATUS 01/19/2007  . ECZEMA 01/19/2007  . Osteoarthritis 01/19/2007  . ALLERGY 01/19/2007   Past Medical History:  Diagnosis Date  . Allergy   . Bradycardia   . Connective tissue disease ([place])   . DDD (degenerative disc disease), cervical   . DDD (degenerative disc disease), lumbar   . Endometrial polyp    not resolved with D&C (following)  . GERD (gastroesophageal reflux disease)    gas/belching issues-was on protonix for short time-not since 6 months  . HLD (hyperlipidemia)   . Mixed connective tissue disease ([place]) diagnosed 5 years ago   no problems since diagnosis  . Multiple thyroid nodules 01/24/2014  . Obesity   . Osteoarthritis of cervical spine   . PONV (postoperative nausea and vomiting)   . Renal calculi   . Spinal stenosis    Past Surgical History:  Procedure  Laterality Date  . APPENDECTOMY    . COLONOSCOPY    . DILATION AND CURETTAGE OF UTERUS     fibroids  . DILATION AND CURETTAGE OF UTERUS  01/2005   endometrial polyp  . ENDOMETRIAL BIOPSY  2008   fibroid  . HAND SURGERY     carpal tunnel release bilateral Dr. Daylene Katayama  . hysteroscopic resection      polyps of PMB  . TONSILLECTOMY    . TUBAL LIGATION     1973  . UPPER GASTROINTESTINAL ENDOSCOPY     Social History   Tobacco Use  . Smoking status: Former Smoker    Types: Cigarettes    Quit date: 07/22/1965    Years  since quitting: 53.6  . Smokeless tobacco: Never Used  Substance Use Topics  . Alcohol use: No  . Drug use: No   Family History  Problem Relation Age of Onset  . Coronary artery disease Father   . Heart attack Father        deceased at 80  . Dementia Mother   . Diabetes Maternal Grandmother   . Goiter Maternal Grandmother   . Diabetes Cousin   . Cancer Grandchild        rare type (dying at age 21)  . Colon cancer Neg Hx   . Esophageal cancer Neg Hx   . Stomach cancer Neg Hx   . Rectal cancer Neg Hx    Allergies  Allergen Reactions  . Codeine Nausea And Vomiting  . Hydrocodone-Acetaminophen     REACTION: reaction not known  . Penicillins Hives   Current Outpatient Medications on File Prior to Visit  Medication Sig Dispense Refill  . azelastine (ASTELIN) 0.1 % nasal spray     . Cholecalciferol (VITAMIN D-3) 1000 units CAPS Take daily by mouth.    . Clobetasol Propionate Emulsion 0.05 % topical foam Apply 1 application topically 2 (two) times a week.     . diclofenac sodium (VOLTAREN) 1 % GEL 3 grams to 3 large joints up to 3 times daily 3 Tube 3  . fluticasone (FLONASE) 50 MCG/ACT nasal spray     . ibuprofen (ADVIL,MOTRIN) 200 MG tablet Take 400 mg by mouth as needed.    . Lactobacillus Rhamnosus, GG, (CULTURELLE PO) Take 1 tablet by mouth daily. Culturelle    . levothyroxine (SYNTHROID, LEVOTHROID) 50 MCG tablet Take 50 mcg 3 times a week (Mon, Wed, Fri),25 mcg 4 times a week (Tues, [place], Sat, Sun)    . polyethylene glycol (MIRALAX / GLYCOLAX) packet You may use this as needed for more acute constipation.  Follow label instructions. 14 each 0  . Prenatal Vit-Fe Fumarate-FA (PRENATAL PO) Take 1 tablet by mouth daily.    . valACYclovir (VALTREX) 1000 MG tablet 2 tablets twice daily for 1 day for fever blisters 30 tablet 6   No current facility-administered medications on file prior to visit.    Review of Systems  Constitutional: Negative for chills, fever, malaise/fatigue  and weight loss.  Respiratory: Negative for cough and shortness of breath.   Cardiovascular: Negative for chest pain and palpitations.  Gastrointestinal: Negative for nausea and vomiting.  Musculoskeletal: Positive for myalgias and neck pain.  Skin: Negative for rash.  Neurological: Positive for headaches. Negative for dizziness.    Observations/Objective: Pt sounded like her usual self  Not in distress  Not hoarse [place] Distance is clear  She hears well on the phone  No cognitive changes Nl affect   Assessment and Plan: Problem  List Items Addressed This Visit      Other   Neck pain    Suspect from combination of OA changes/facet joint and DDD  She plans to f/u with orthopedics to see if MRI is needed No neuro symptoms Disc use of good pillow with cervical support and heat Offered PT ref-she would rather see orthopedics first  inst to update if she needs referral          Follow Up Instructions: Call your orthopedic office to get an appointment for neck pain  I suspect you have a combination of arthritis (facet joint) as well as degenerative discs If you want to see PT while waiting for an appt let us know  Try the voltaren gel Ice or heat Cervical support pillow  If any numbness/weakness please let us know    I discussed the assessment and treatment plan with the patient. The patient was provided an opportunity to ask questions and all were answered. The patient agreed with the plan and demonstrated an understanding of the instructions.   The patient was advised to call back or seek an in-person evaluation if the symptoms worsen or if the condition fails to improve as anticipated.  I provided 13 minutes of non-face-to-face time during this encounter.   Loura Pardon, MD

## 2019-03-02 NOTE — Patient Instructions (Signed)
Call your orthopedic office to get an appointment for neck pain  I suspect you have a combination of arthritis (facet joint) as well as degenerative discs If you want to see PT while waiting for an appt let us know  Try the voltaren gel Ice or heat Cervical support pillow  If any numbness/weakness please let us know

## 2019-03-02 NOTE — Assessment & Plan Note (Signed)
Suspect from combination of OA changes/facet joint and DDD  She plans to f/u with orthopedics to see if MRI is needed No neuro symptoms Disc use of good pillow with cervical support and heat Offered PT ref-she would rather see orthopedics first  inst to update if she needs referral

## 2019-03-09 DIAGNOSIS — Z6830 Body mass index (BMI) 30.0-30.9, adult: Secondary | ICD-10-CM | POA: Diagnosis not present

## 2019-03-09 DIAGNOSIS — E061 Subacute thyroiditis: Secondary | ICD-10-CM | POA: Diagnosis not present

## 2019-03-09 DIAGNOSIS — E039 Hypothyroidism, unspecified: Secondary | ICD-10-CM | POA: Diagnosis not present

## 2019-03-09 DIAGNOSIS — E042 Nontoxic multinodular goiter: Secondary | ICD-10-CM | POA: Diagnosis not present

## 2019-03-09 DIAGNOSIS — N959 Unspecified menopausal and perimenopausal disorder: Secondary | ICD-10-CM | POA: Diagnosis not present

## 2019-03-09 DIAGNOSIS — Q842 Other congenital malformations of hair: Secondary | ICD-10-CM | POA: Diagnosis not present

## 2019-03-09 DIAGNOSIS — D440 Neoplasm of uncertain behavior of thyroid gland: Secondary | ICD-10-CM | POA: Diagnosis not present

## 2019-03-09 DIAGNOSIS — E6609 Other obesity due to excess calories: Secondary | ICD-10-CM | POA: Diagnosis not present

## 2019-03-10 DIAGNOSIS — L309 Dermatitis, unspecified: Secondary | ICD-10-CM | POA: Diagnosis not present

## 2019-03-10 DIAGNOSIS — L814 Other melanin hyperpigmentation: Secondary | ICD-10-CM | POA: Diagnosis not present

## 2019-03-10 DIAGNOSIS — L820 Inflamed seborrheic keratosis: Secondary | ICD-10-CM | POA: Diagnosis not present

## 2019-03-10 DIAGNOSIS — L821 Other seborrheic keratosis: Secondary | ICD-10-CM | POA: Diagnosis not present

## 2019-03-10 DIAGNOSIS — Q828 Other specified congenital malformations of skin: Secondary | ICD-10-CM | POA: Diagnosis not present

## 2019-03-10 DIAGNOSIS — L570 Actinic keratosis: Secondary | ICD-10-CM | POA: Diagnosis not present

## 2019-03-16 ENCOUNTER — Other Ambulatory Visit: Payer: Self-pay

## 2019-03-16 ENCOUNTER — Ambulatory Visit (AMBULATORY_SURGERY_CENTER): Payer: Self-pay

## 2019-03-16 VITALS — Ht 64.0 in | Wt 183.2 lb

## 2019-03-16 DIAGNOSIS — Z86010 Personal history of colonic polyps: Secondary | ICD-10-CM

## 2019-03-16 MED ORDER — PEG 3350-KCL-NA BICARB-NACL 420 G PO SOLR: 4000.0000 mL | Freq: Once | ORAL | 0 refills | Status: AC

## 2019-03-16 NOTE — Progress Notes (Signed)
Denies allergies to eggs or soy products. Denies complication of anesthesia or sedation. Denies use of weight loss medication. Denies use of O2.   Emmi instructions given for colonoscopy.  

## 2019-03-18 DIAGNOSIS — E039 Hypothyroidism, unspecified: Secondary | ICD-10-CM | POA: Diagnosis not present

## 2019-03-18 DIAGNOSIS — E041 Nontoxic single thyroid nodule: Secondary | ICD-10-CM | POA: Diagnosis not present

## 2019-03-18 DIAGNOSIS — D440 Neoplasm of uncertain behavior of thyroid gland: Secondary | ICD-10-CM | POA: Diagnosis not present

## 2019-03-18 DIAGNOSIS — Q842 Other congenital malformations of hair: Secondary | ICD-10-CM | POA: Diagnosis not present

## 2019-03-18 DIAGNOSIS — E6609 Other obesity due to excess calories: Secondary | ICD-10-CM | POA: Diagnosis not present

## 2019-03-18 DIAGNOSIS — E061 Subacute thyroiditis: Secondary | ICD-10-CM | POA: Diagnosis not present

## 2019-03-18 DIAGNOSIS — N959 Unspecified menopausal and perimenopausal disorder: Secondary | ICD-10-CM | POA: Diagnosis not present

## 2019-03-18 DIAGNOSIS — E042 Nontoxic multinodular goiter: Secondary | ICD-10-CM | POA: Diagnosis not present

## 2019-03-30 ENCOUNTER — Encounter: Payer: Self-pay | Admitting: Gastroenterology

## 2019-04-12 ENCOUNTER — Telehealth: Payer: Self-pay

## 2019-04-12 NOTE — Telephone Encounter (Signed)
Pt answered "NO" to all Covid Questions °

## 2019-04-12 NOTE — Telephone Encounter (Signed)
Covid-19 screening questions   Do you now or have you had a fever in the last 14 days?  Do you have any respiratory symptoms of shortness of breath or cough now or in the last 14 days?  Do you have any family members or close contacts with diagnosed or suspected Covid-19 in the past 14 days?  Have you been tested for Covid-19 and found to be positive?       

## 2019-04-13 ENCOUNTER — Other Ambulatory Visit: Payer: Self-pay

## 2019-04-13 ENCOUNTER — Encounter: Payer: Self-pay | Admitting: Gastroenterology

## 2019-04-13 ENCOUNTER — Ambulatory Visit (AMBULATORY_SURGERY_CENTER): Payer: PPO | Admitting: Gastroenterology

## 2019-04-13 VITALS — BP 136/43 | HR 45 | Temp 98.0°F | Resp 15 | Ht 64.0 in | Wt 183.0 lb

## 2019-04-13 DIAGNOSIS — Z86010 Personal history of colonic polyps: Secondary | ICD-10-CM

## 2019-04-13 DIAGNOSIS — E7800 Pure hypercholesterolemia, unspecified: Secondary | ICD-10-CM | POA: Diagnosis not present

## 2019-04-13 MED ORDER — SODIUM CHLORIDE 0.9 % IV SOLN: 500.0000 mL | Freq: Once | INTRAVENOUS | Status: DC

## 2019-04-13 NOTE — Progress Notes (Signed)
Report given to PACU, vss 

## 2019-04-13 NOTE — Op Note (Signed)
[facility] Patient Name: Morgan [name] Procedure Date: 04/13/2019 1:09 PM MRN: [id number] Endoscopist: Mauri Pole , MD Age: 73 Referring MD:  Date of Birth: 1945/11/30 Gender: Female Account #: [id number] Procedure:                Colonoscopy Indications:              High risk colon cancer surveillance: Personal                            history of colonic polyps, High risk colon cancer                            surveillance: Personal history of adenoma (10 mm or                            greater in size) Medicines:                Monitored Anesthesia Care Procedure:                Pre-Anesthesia Assessment:                           - Prior to the procedure, a History and Physical                            was performed, and patient medications and                            allergies were reviewed. The patient's tolerance of                            previous anesthesia was also reviewed. The risks                            and benefits of the procedure and the sedation                            options and risks were discussed with the patient.                            All questions were answered, and informed consent                            was obtained. Prior Anticoagulants: The patient has                            taken no previous anticoagulant or antiplatelet                            agents. ASA Grade Assessment: II - A patient with                            mild systemic disease. After reviewing the risks  and benefits, the patient was deemed in                            satisfactory condition to undergo the procedure.                           After obtaining informed consent, the colonoscope                            was passed under direct vision. Throughout the                            procedure, the patient's blood pressure, pulse, and                            oxygen saturations were monitored  continuously. The                            Colonoscope was introduced through the anus and                            advanced to the the cecum, identified by                            appendiceal orifice and ileocecal valve. The                            colonoscopy was performed without difficulty. The                            patient tolerated the procedure well. The quality                            of the bowel preparation was excellent. The                            ileocecal valve, appendiceal orifice, and rectum                            were photographed. Scope In: 1:14:30 PM Scope Out: 1:35:11 PM Scope Withdrawal Time: 0 hours 10 minutes 26 seconds  Total Procedure Duration: 0 hours 20 minutes 41 seconds  Findings:                 The perianal and digital rectal examinations were                            normal.                           Scattered small-mouthed diverticula were found in                            the sigmoid colon and descending colon.  Non-bleeding internal hemorrhoids were found during                            retroflexion. The hemorrhoids were small. Complications:            No immediate complications. Estimated Blood Loss:     Estimated blood loss was minimal. Impression:               - Mild diverticulosis in the sigmoid colon and in                            the descending colon.                           - Non-bleeding internal hemorrhoids.                           - No specimens collected. Recommendation:           - Patient has a contact number available for                            emergencies. The signs and symptoms of potential                            delayed complications were discussed with the                            patient. Return to normal activities tomorrow.                            Written discharge instructions were provided to the                            patient.                            - Resume previous diet.                           - Continue present medications.                           - Repeat colonoscopy in 5 years for surveillance. Mauri Pole, MD 04/13/2019 1:41:25 PM This report has been signed electronically.

## 2019-04-13 NOTE — Patient Instructions (Signed)
Please read handouts provided. Continue present medications.     YOU HAD AN ENDOSCOPIC PROCEDURE TODAY AT THE [facility]:   Refer to the procedure report that was given to you for any specific questions about what was found during the examination.  If the procedure report does not answer your questions, please call your gastroenterologist to clarify.  If you requested that your care partner not be given the details of your procedure findings, then the procedure report has been included in a sealed envelope for you to review at your convenience later.  YOU SHOULD EXPECT: Some feelings of bloating in the abdomen. Passage of more gas than usual.  Walking can help get rid of the air that was put into your GI tract during the procedure and reduce the bloating. If you had a lower endoscopy (such as a colonoscopy or flexible sigmoidoscopy) you may notice spotting of blood in your stool or on the toilet paper. If you underwent a bowel prep for your procedure, you may not have a normal bowel movement for a few days.  Please Note:  You might notice some irritation and congestion in your nose or some drainage.  This is from the oxygen used during your procedure.  There is no need for concern and it should clear up in a day or so.  SYMPTOMS TO REPORT IMMEDIATELY:   Following lower endoscopy (colonoscopy or flexible sigmoidoscopy):  Excessive amounts of blood in the stool  Significant tenderness or worsening of abdominal pains  Swelling of the abdomen that is new, acute  Fever of 100F or higher   For urgent or emergent issues, a gastroenterologist can be reached at any hour by calling [phone].   DIET:  We do recommend a small meal at first, but then you may proceed to your regular diet.  Drink plenty of fluids but you should avoid alcoholic beverages for 24 hours.  ACTIVITY:  You should plan to take it easy for the rest of today and you should NOT DRIVE or use heavy machinery  until tomorrow (because of the sedation medicines used during the test).    FOLLOW UP: Our staff will call the number listed on your records 48-72 hours following your procedure to check on you and address any questions or concerns that you may have regarding the information given to you following your procedure. If we do not reach you, we will leave a message.  We will attempt to reach you two times.  During this call, we will ask if you have developed any symptoms of COVID 19. If you develop any symptoms (ie: fever, flu-like symptoms, shortness of breath, cough etc.) before then, please call [phone].  If you test positive for Covid 19 in the 2 weeks post procedure, please call and report this information to us.    If any biopsies were taken you will be contacted by phone or by letter within the next 1-3 weeks.  Please call us at [phone] if you have not heard about the biopsies in 3 weeks.    SIGNATURES/CONFIDENTIALITY: You and/or your care partner have signed paperwork which will be entered into your electronic medical record.  These signatures attest to the fact that that the information above on your After Visit Summary has been reviewed and is understood.  Full responsibility of the confidentiality of this discharge information lies with you and/or your care-partner. 

## 2019-04-13 NOTE — Progress Notes (Signed)
Temp check by JB/vital check by CW.  No changes in medical or surgical history since pre-visit screening on 03/16/19.

## 2019-04-15 ENCOUNTER — Telehealth: Payer: Self-pay | Admitting: *Deleted

## 2019-04-15 DIAGNOSIS — D692 Other nonthrombocytopenic purpura: Secondary | ICD-10-CM | POA: Diagnosis not present

## 2019-04-15 DIAGNOSIS — L820 Inflamed seborrheic keratosis: Secondary | ICD-10-CM | POA: Diagnosis not present

## 2019-04-15 DIAGNOSIS — I781 Nevus, non-neoplastic: Secondary | ICD-10-CM | POA: Diagnosis not present

## 2019-04-15 DIAGNOSIS — L300 Nummular dermatitis: Secondary | ICD-10-CM | POA: Diagnosis not present

## 2019-04-15 NOTE — Telephone Encounter (Signed)
  Follow up Call-  Call back number 04/13/2019  Post procedure Call Back phone  # [phone]  Permission to leave phone message Yes  Some recent data might be hidden     Patient questions:  Do you have a fever, pain , or abdominal swelling? No. Pain Score  0 *  Have you tolerated food without any problems? Yes.    Have you been able to return to your normal activities? Yes.    Do you have any questions about your discharge instructions: Diet   No. Medications  No. Follow up visit  No.  Do you have questions or concerns about your Care? No.  Actions: * If pain score is 4 or above: No action needed, pain <4.  1. Have you developed a fever since your procedure? no  2.   Have you had an respiratory symptoms (SOB or cough) since your procedure? no  3.   Have you tested positive for COVID 19 since your procedure no  4.   Have you had any family members/close contacts diagnosed with the COVID 19 since your procedure?  no   If yes to any of these questions please route to Joylene John, RN and Alphonsa Gin, Therapist, sports.

## 2019-05-10 ENCOUNTER — Telehealth: Payer: Self-pay | Admitting: Family Medicine

## 2019-05-10 NOTE — Telephone Encounter (Signed)
Patient called today stating that she is being charged for her physical visit on 02/09/2019. She spoke with our billing department and they advised her to contact us because the visit was coded wrong. This visit was a virtual visit with Dr Glori Bickers.

## 2019-05-11 ENCOUNTER — Encounter: Payer: Self-pay | Admitting: Family Medicine

## 2019-05-11 DIAGNOSIS — Z1231 Encounter for screening mammogram for malignant neoplasm of breast: Secondary | ICD-10-CM | POA: Diagnosis not present

## 2019-05-17 NOTE — Telephone Encounter (Signed)
I spoke with the patient and let her know that [employer] is working to get her claim corrected and that her bill has been placed in contested status.

## 2019-05-19 DIAGNOSIS — H5203 Hypermetropia, bilateral: Secondary | ICD-10-CM | POA: Diagnosis not present

## 2019-05-19 DIAGNOSIS — H25099 Other age-related incipient cataract, unspecified eye: Secondary | ICD-10-CM | POA: Diagnosis not present

## 2019-05-19 DIAGNOSIS — H524 Presbyopia: Secondary | ICD-10-CM | POA: Diagnosis not present

## 2019-06-16 ENCOUNTER — Other Ambulatory Visit: Payer: Self-pay

## 2019-07-29 ENCOUNTER — Other Ambulatory Visit: Payer: Self-pay

## 2019-07-29 ENCOUNTER — Ambulatory Visit (INDEPENDENT_AMBULATORY_CARE_PROVIDER_SITE_OTHER): Payer: PPO | Admitting: Internal Medicine

## 2019-07-29 ENCOUNTER — Encounter: Payer: Self-pay | Admitting: Internal Medicine

## 2019-07-29 VITALS — Temp 97.1°F

## 2019-07-29 DIAGNOSIS — M545 Low back pain, unspecified: Secondary | ICD-10-CM

## 2019-07-29 DIAGNOSIS — R1084 Generalized abdominal pain: Secondary | ICD-10-CM

## 2019-07-29 DIAGNOSIS — R05 Cough: Secondary | ICD-10-CM | POA: Diagnosis not present

## 2019-07-29 DIAGNOSIS — R059 Cough, unspecified: Secondary | ICD-10-CM

## 2019-07-29 DIAGNOSIS — R509 Fever, unspecified: Secondary | ICD-10-CM | POA: Diagnosis not present

## 2019-07-29 DIAGNOSIS — R52 Pain, unspecified: Secondary | ICD-10-CM

## 2019-07-29 NOTE — Progress Notes (Signed)
Virtual Visit via Video Note  I connected with [name] on 07/29/19 at 10:30 AM EST by a video enabled telemedicine application and verified that I am speaking with the correct person using two identifiers.  Location: Patient: Home Provider: Office   I discussed the limitations of evaluation and management by telemedicine and the availability of in person appointments. The patient expressed understanding and agreed to proceed.  History of Present Illness:  Pt reports fever, cough, low back pain and abdominal pain. This started last night. She reports fever up to 100.3. She has had some chills but denies body aches. The cough is dry and non productive. She denies headache, runny nose, nasal congestion, ear pain, sore throat, loss of taste/smell or SOB. She abdominal pain is generalized. She denies nausea, vomiting, constipation, diarrhea or blood in the stool. She denies urinary or vaginal complaints. She describes the back pain in the middle of her lower back, feels like pressure. The back pain does not radiate. She reports history of chronic back pain but this feels different. She has had exposure to COVID, and has not been tested.  Past Medical History:  Diagnosis Date  . Allergy   . Bradycardia   . Connective tissue disease ([place])   . DDD (degenerative disc disease), cervical   . DDD (degenerative disc disease), lumbar   . Endometrial polyp    not resolved with D&C (following)  . GERD (gastroesophageal reflux disease)    gas/belching issues-was on protonix for short time-not since 6 months  . HLD (hyperlipidemia)   . Mixed connective tissue disease ([place]) diagnosed 5 years ago   no problems since diagnosis  . Multiple thyroid nodules 01/24/2014  . Obesity   . Osteoarthritis of cervical spine   . PONV (postoperative nausea and vomiting)   . Renal calculi   . Spinal stenosis     Current Outpatient Medications  Medication Sig Dispense Refill  . Ascorbic Acid (VITAMIN C) 1000  MG tablet Take 1,000 mg by mouth daily.    Marland Kitchen azelastine (ASTELIN) 0.1 % nasal spray     . Cholecalciferol (VITAMIN D-3) 1000 units CAPS Take daily by mouth.    . Clobetasol Propionate Emulsion 0.05 % topical foam Apply 1 application topically 2 (two) times a week.     . diclofenac sodium (VOLTAREN) 1 % GEL 3 grams to 3 large joints up to 3 times daily 3 Tube 3  . fluticasone (FLONASE) 50 MCG/ACT nasal spray     . ibuprofen (ADVIL,MOTRIN) 200 MG tablet Take 400 mg by mouth as needed.    . Lactobacillus Rhamnosus, GG, (CULTURELLE PO) Take 1 tablet by mouth daily. Culturelle    . levothyroxine (SYNTHROID, LEVOTHROID) 50 MCG tablet Take 50 mcg 3 times a week (Mon, Wed, Fri),25 mcg 4 times a week (Tues, [place], Sat, Sun)    . LYSINE PO Take by mouth.    . polyethylene glycol (MIRALAX / GLYCOLAX) packet You may use this as needed for more acute constipation.  Follow label instructions. 14 each 0  . Prenatal Vit-Fe Fumarate-FA (PRENATAL PO) Take 1 tablet by mouth daily.    . valACYclovir (VALTREX) 1000 MG tablet 2 tablets twice daily for 1 day for fever blisters 30 tablet 6  . Zinc 50 MG TABS Take by mouth.     No current facility-administered medications for this visit.    Allergies  Allergen Reactions  . Codeine Nausea And Vomiting  . Hydrocodone-Acetaminophen     REACTION: reaction not  known  . Penicillins Hives    Family History  Problem Relation Age of Onset  . Coronary artery disease Father   . Heart attack Father        deceased at 17  . Dementia Mother   . Diabetes Maternal Grandmother   . Goiter Maternal Grandmother   . Diabetes Cousin   . Cancer Grandchild        rare type (dying at age 68)  . Colon cancer Neg Hx   . Esophageal cancer Neg Hx   . Stomach cancer Neg Hx   . Rectal cancer Neg Hx     Social History   Socioeconomic History  . Marital status: Married    Spouse name: Not on file  . Number of children: 4  . Years of education: HS  . Highest education level:  Not on file  Occupational History  . Occupation: Retired   Tobacco Use  . Smoking status: Former Smoker    Types: Cigarettes    Quit date: 07/22/1965    Years since quitting: 54.0  . Smokeless tobacco: Never Used  Substance and Sexual Activity  . Alcohol use: No  . Drug use: No  . Sexual activity: Yes    Birth control/protection: Surgical    Comment: BTL  Other Topics Concern  . Not on file  Social History Narrative   + caffeine use     Social Determinants of Health   Financial Resource Strain:   . Difficulty of Paying Living Expenses: Not on file  Food Insecurity:   . Worried About Charity fundraiser in the Last Year: Not on file  . Ran Out of Food in the Last Year: Not on file  Transportation Needs:   . Lack of Transportation (Medical): Not on file  . Lack of Transportation (Non-Medical): Not on file  Physical Activity:   . Days of Exercise per Week: Not on file  . Minutes of Exercise per Session: Not on file  Stress:   . Feeling of Stress : Not on file  Social Connections:   . Frequency of Communication with Friends and Family: Not on file  . Frequency of Social Gatherings with Friends and Family: Not on file  . Attends Religious Services: Not on file  . Active Member of Clubs or Organizations: Not on file  . Attends Archivist Meetings: Not on file  . Marital Status: Not on file  Intimate Partner Violence:   . Fear of Current or Ex-Partner: Not on file  . Emotionally Abused: Not on file  . Physically Abused: Not on file  . Sexually Abused: Not on file     Constitutional: Pt reports fever. Denies  malaise, fatigue, headache or abrupt weight changes.  HEENT: Denies eye pain, eye redness, ear pain, ringing in the ears, wax buildup, runny nose, nasal congestion, bloody nose, or sore throat. Respiratory: Pt reports cough. Denies difficulty breathing, shortness of breath, or sputum production.   Cardiovascular: Denies chest pain, chest tightness,  palpitations or swelling in the hands or feet.  Gastrointestinal: Pt reports abdominal pain. Denies bloating, constipation, diarrhea or blood in the stool.  GU: Denies urgency, frequency, pain with urination, burning sensation, blood in urine, odor or discharge. Musculoskeletal: Pt reports low back pain. Denies decrease in range of motion, difficulty with gait, or joint pain and swelling.  Skin: Denies redness, rashes, lesions or ulcercations.    No other specific complaints in a complete review of systems (except as listed in  HPI above).  Observations/Objective:  Temp (!) 97.1 F (36.2 C) (Oral)   LMP 07/22/1989 (Approximate)  Wt Readings from Last 3 Encounters:  04/13/19 183 lb (83 kg)  03/16/19 183 lb 3.2 oz (83.1 kg)  02/09/19 180 lb (81.6 kg)    General: Appears her stated age, well developed,  well nourished in NAD. HEENT: Head: normal shape and size; Eyes:  EOMs intact;  Nose: no congestion noted; Throat/Mouth: No hoarseness noted.  Pulmonary/Chest: Normal effort. No respiratory distress.  Neurological: Alert and oriented.    BMET    Component Value Date/Time   NA 141 02/02/2019 1039   K 4.6 02/02/2019 1039   CL 106 02/02/2019 1039   CO2 30 02/02/2019 1039   GLUCOSE 82 02/02/2019 1039   BUN 14 02/02/2019 1039   CREATININE 0.70 02/02/2019 1039   CREATININE 0.76 12/29/2018 1526   CALCIUM 9.2 02/02/2019 1039   GFRNONAA 78 12/29/2018 1526   GFRAA 91 12/29/2018 1526    Lipid Panel     Component Value Date/Time   CHOL 202 (H) 02/02/2019 1039   TRIG 86.0 02/02/2019 1039   HDL 50.30 02/02/2019 1039   CHOLHDL 4 02/02/2019 1039   VLDL 17.2 02/02/2019 1039   LDLCALC 135 (H) 02/02/2019 1039    CBC    Component Value Date/Time   WBC 4.6 02/02/2019 1039   RBC 4.29 02/02/2019 1039   HGB 13.0 02/02/2019 1039   HCT 38.7 02/02/2019 1039   PLT 216.0 02/02/2019 1039   MCV 90.3 02/02/2019 1039   MCH 29.8 12/29/2018 1526   MCHC 33.5 02/02/2019 1039   RDW 13.3  02/02/2019 1039   LYMPHSABS 1.6 02/02/2019 1039   MONOABS 0.7 02/02/2019 1039   EOSABS 0.1 02/02/2019 1039   BASOSABS 0.0 02/02/2019 1039    Hgb A1C Lab Results  Component Value Date   HGBA1C 5.3 08/12/2016        Assessment and Plan:  Fever, Body Aches, Cough, Abdominal Pain, Low Back Pain:  Information given on how to schedule an appt for COVID testing Discussed symptomatic care: rest and fluids Can take Tylenol/Ibuprofen as needed for body aches Can take Delsym as needed for cough Encouraged heating pad for back pain Encouraged self quarantine x 10 days Discussed the importance of masking, frequent handwashing and social distancing in the home  ER precautions discussed  Follow Up Instructions:    I discussed the assessment and treatment plan with the patient. The patient was provided an opportunity to ask questions and all were answered. The patient agreed with the plan and demonstrated an understanding of the instructions.   The patient was advised to call back or seek an in-person evaluation if the symptoms worsen or if the condition fails to improve as anticipated.     Webb Silversmith, NP

## 2019-07-29 NOTE — Patient Instructions (Signed)

## 2019-07-30 ENCOUNTER — Ambulatory Visit: Payer: PPO | Attending: Internal Medicine

## 2019-07-30 DIAGNOSIS — Z20822 Contact with and (suspected) exposure to covid-19: Secondary | ICD-10-CM

## 2019-08-01 LAB — NOVEL CORONAVIRUS, NAA: SARS-CoV-2, NAA: NOT DETECTED

## 2019-08-02 ENCOUNTER — Telehealth: Payer: Self-pay | Admitting: Family Medicine

## 2019-08-02 NOTE — Telephone Encounter (Signed)
What doctor's office?  When is appointment?  Her test was negative thankfully How are her symptoms ?

## 2019-08-02 NOTE — Telephone Encounter (Signed)
Per Leafy Ro if pt was exposed and had covid test done to early then we can recommend her to have covid test done 5-7 days after 1st test to verify she is truly negative, pt advised and she will schedule 2nd test

## 2019-08-02 NOTE — Telephone Encounter (Signed)
Patient stated that she had a covid test done last Tuesday She was advised that in order for her to return to the doctors office she would have to get another test done. She would like to know when she should go have this test done  Please advise

## 2019-08-02 NOTE — Telephone Encounter (Signed)
Sh[place]- is the rule that a pt has to have a 2nd neg test to come back or just be symptom free?  Thanks

## 2019-08-02 NOTE — Telephone Encounter (Signed)
Pt said it was Webb Silversmith, NP that told her she has to have 2 negative covid test in order to be seen in our office. Pt said that she doesn't have any appts scheduled with [place] but she wanted to make sure she has the 2 negative covid test incase she needs an appt here. Pt said that she was standing out in the cold for over 30 min waiting for her turn to get her covid test done and it was 30 degrees outside so after she came home from getting tested she has developed a ST. Pt said the ST is the only issue she is dealing with. She has no fever, no cough, no congestion, no GI issues, no abd pain or anything. Pt said she just didn't want to get rested to early but doesn't want to wait to late so she needs to know the date that Dr. Glori Bickers recommends that she get re-tested so she can call and make an appt

## 2019-08-04 ENCOUNTER — Ambulatory Visit: Payer: PPO | Attending: Internal Medicine

## 2019-08-04 DIAGNOSIS — Z20822 Contact with and (suspected) exposure to covid-19: Secondary | ICD-10-CM

## 2019-08-05 LAB — NOVEL CORONAVIRUS, NAA: SARS-CoV-2, NAA: NOT DETECTED

## 2019-10-05 ENCOUNTER — Encounter: Payer: Self-pay | Admitting: Family Medicine

## 2019-10-05 ENCOUNTER — Ambulatory Visit (INDEPENDENT_AMBULATORY_CARE_PROVIDER_SITE_OTHER): Payer: PPO | Admitting: Family Medicine

## 2019-10-05 ENCOUNTER — Other Ambulatory Visit: Payer: Self-pay

## 2019-10-05 VITALS — BP 128/80 | HR 64 | Temp 97.0°F | Ht 65.0 in | Wt 182.2 lb

## 2019-10-05 DIAGNOSIS — R202 Paresthesia of skin: Secondary | ICD-10-CM

## 2019-10-05 DIAGNOSIS — E559 Vitamin D deficiency, unspecified: Secondary | ICD-10-CM | POA: Insufficient documentation

## 2019-10-05 NOTE — Patient Instructions (Addendum)
I suspect you may have some peripheral neuropathy in your feet   Stay as active as you can be and eat a balanced diet   I placed a referral to neurology- our office will call you in the next 2 weeks to set you up

## 2019-10-05 NOTE — Progress Notes (Signed)
Subjective:    Patient ID: Morgan [name], female    DOB: 06-Mar-1946, 74 y.o.   MRN: [id number]  HPI Pt presents with foot and leg problems    Wt Readings from Last 3 Encounters:  10/05/19 182 lb 4 oz (82.7 kg)  04/13/19 183 lb (83 kg)  03/16/19 183 lb 3.2 oz (83.1 kg)   30.33 kg/m   In feb 2007 one of her toes went numb in R foot (2nd)  By Carilyn Goodpasture more toes turning numb - all 10 of them  (sensation is less than she would expect)  No tingling or burning   Now it is starting to move up the back of her legs (very slightly)   Does not tend to trip or loose balance   Has spinal stenosis at S1 for years    Lab Results  Component Value Date   HGBA1C 5.3 08/12/2016   Lab Results  Component Value Date   ESRSEDRATE 2 12/29/2018   Lab Results  Component Value Date   VITAMINB12 517 08/12/2016   Vit D level 54 in June  Deficient in the past-now supplementing   Lab Results  Component Value Date   TSH 0.92 02/02/2019    Patient Active Problem List   Diagnosis Date Noted  . Vitamin D deficiency 10/05/2019  . Neck pain 03/02/2019  . Colon cancer screening 02/09/2019  . Snoring 08/06/2017  . Nasal congestion 08/06/2017  . Paresthesia of foot, bilateral 08/12/2016  . Encounter for Medicare annual wellness exam 04/13/2014  . Allergic rhinitis 04/13/2014  . Routine general medical examination at a health care facility 04/07/2014  . Multiple thyroid nodules 01/24/2014  . History of small bowel obstruction 01/20/2014  . Low back strain 01/07/2014  . DDD (degenerative disc disease), cervical   . DDD (degenerative disc disease), lumbar   . Spinal stenosis   . Obese   . TINNITUS 06/08/2010  . NONSPECIFIC ABNORMAL TOXICOLOGICAL FINDINGS 05/11/2010  . Hypothyroidism 04/18/2010  . BRADYCARDIA 05/31/2008  . GERD 05/03/2008  . DYSPEPSIA 05/03/2008  . DIVERTICULOSIS OF COLON 04/26/2008  . HEPATITIS, HX OF 04/26/2008  . COLONIC POLYPS, HYPERPLASTIC, HX OF 04/26/2008  .  HEMORRHOIDS, INTERNAL 01/11/2008  . DERMATITIS, SEBORRHEIC NEC 01/28/2007  . Hyperlipidemia 01/19/2007  . IBS 01/19/2007  . OVERACTIVE BLADDER 01/19/2007  . POSTMENOPAUSAL STATUS 01/19/2007  . ECZEMA 01/19/2007  . Osteoarthritis 01/19/2007  . ALLERGY 01/19/2007   Past Medical History:  Diagnosis Date  . Allergy   . Bradycardia   . Connective tissue disease ([place])   . DDD (degenerative disc disease), cervical   . DDD (degenerative disc disease), lumbar   . Endometrial polyp    not resolved with D&C (following)  . GERD (gastroesophageal reflux disease)    gas/belching issues-was on protonix for short time-not since 6 months  . HLD (hyperlipidemia)   . Mixed connective tissue disease ([place]) diagnosed 5 years ago   no problems since diagnosis  . Multiple thyroid nodules 01/24/2014  . Obesity   . Osteoarthritis of cervical spine   . PONV (postoperative nausea and vomiting)   . Renal calculi   . Spinal stenosis    Past Surgical History:  Procedure Laterality Date  . APPENDECTOMY    . COLONOSCOPY    . DILATION AND CURETTAGE OF UTERUS     fibroids  . DILATION AND CURETTAGE OF UTERUS  01/2005   endometrial polyp  . ENDOMETRIAL BIOPSY  2008   fibroid  . HAND SURGERY  carpal tunnel release bilateral Dr. Daylene Katayama  . hysteroscopic resection      polyps of PMB  . TONSILLECTOMY    . TUBAL LIGATION     1973  . UPPER GASTROINTESTINAL ENDOSCOPY     Social History   Tobacco Use  . Smoking status: Former Smoker    Types: Cigarettes    Quit date: 07/22/1965    Years since quitting: 54.2  . Smokeless tobacco: Never Used  Substance Use Topics  . Alcohol use: No  . Drug use: No   Family History  Problem Relation Age of Onset  . Coronary artery disease Father   . Heart attack Father        deceased at 31  . Dementia Mother   . Diabetes Maternal Grandmother   . Goiter Maternal Grandmother   . Diabetes Cousin   . Cancer Grandchild        rare type (dying at age 79)  . Colon  cancer Neg Hx   . Esophageal cancer Neg Hx   . Stomach cancer Neg Hx   . Rectal cancer Neg Hx    Allergies  Allergen Reactions  . Codeine Nausea And Vomiting  . Hydrocodone-Acetaminophen     REACTION: reaction not known  . Penicillins Hives   Current Outpatient Medications on File Prior to Visit  Medication Sig Dispense Refill  . Ascorbic Acid (VITAMIN C) 1000 MG tablet Take 1,000 mg by mouth daily.    Marland Kitchen azelastine (ASTELIN) 0.1 % nasal spray     . Cholecalciferol (VITAMIN D-3) 1000 units CAPS Take daily by mouth.    . Clobetasol Propionate Emulsion 0.05 % topical foam Apply 1 application topically 2 (two) times a week.     . diclofenac sodium (VOLTAREN) 1 % GEL 3 grams to 3 large joints up to 3 times daily 3 Tube 3  . fluticasone (FLONASE) 50 MCG/ACT nasal spray     . ibuprofen (ADVIL,MOTRIN) 200 MG tablet Take 400 mg by mouth as needed.    . Lactobacillus Rhamnosus, GG, (CULTURELLE PO) Take 1 tablet by mouth daily. Culturelle    . levothyroxine (SYNTHROID, LEVOTHROID) 50 MCG tablet Take 50 mcg 3 times a week (Mon, Wed, Fri),25 mcg 4 times a week (Tues, [place], Sat, Sun)    . LYSINE PO Take 50 mg by mouth daily.     . polyethylene glycol (MIRALAX / GLYCOLAX) packet You may use this as needed for more acute constipation.  Follow label instructions. 14 each 0  . Prenatal Vit-Fe Fumarate-FA (PRENATAL PO) Take 1 tablet by mouth daily.    . valACYclovir (VALTREX) 1000 MG tablet 2 tablets twice daily for 1 day for fever blisters 30 tablet 6  . Zinc 50 MG TABS Take by mouth.     No current facility-administered medications on file prior to visit.    Review of Systems  Constitutional: Negative for activity change, appetite change, fatigue, fever and unexpected weight change.  HENT: Negative for congestion, ear pain, rhinorrhea, sinus pressure and sore throat.   Eyes: Negative for pain, redness and visual disturbance.  Respiratory: Negative for cough, shortness of breath and wheezing.     Cardiovascular: Negative for chest pain and palpitations.  Gastrointestinal: Negative for abdominal pain, blood in stool, constipation and diarrhea.  Endocrine: Negative for polydipsia and polyuria.  Genitourinary: Negative for dysuria, frequency and urgency.  Musculoskeletal: Negative for arthralgias, back pain and myalgias.  Skin: Negative for pallor and rash.  Allergic/Immunologic: Negative for environmental allergies.  Neurological:  Positive for numbness. Negative for dizziness, tremors, syncope, facial asymmetry, weakness, light-headedness and headaches.  Hematological: Negative for adenopathy. Does not bruise/bleed easily.  Psychiatric/Behavioral: Negative for decreased concentration and dysphoric mood. The patient is not nervous/anxious.        Objective:   Physical Exam Constitutional:      General: She is not in acute distress.    Appearance: Normal appearance. She is obese. She is not ill-appearing.  HENT:     Head: Normocephalic and atraumatic.     Mouth/Throat:     Mouth: Mucous membranes are moist.  Eyes:     Extraocular Movements: Extraocular movements intact.     Conjunctiva/sclera: Conjunctivae normal.     Pupils: Pupils are equal, round, and reactive to light.  Neck:     Vascular: No carotid bruit.  Cardiovascular:     Rate and Rhythm: Normal rate and regular rhythm.     Pulses: Normal pulses.     Heart sounds: Normal heart sounds.  Pulmonary:     Effort: Pulmonary effort is normal. No respiratory distress.     Breath sounds: Normal breath sounds. No wheezing or rales.  Musculoskeletal:     Cervical back: Normal range of motion and neck supple.  Skin:    General: Skin is warm and dry.     Coloration: Skin is not pale.     Findings: No erythema or rash.     Comments: No color change in feet Some spider veins in ankle  Neurological:     Mental Status: She is alert.     Sensory: Sensory deficit present.     Motor: No weakness.     Coordination:  Coordination normal.     Gait: Gait normal.     Deep Tendon Reflexes: Reflexes normal.     Comments: Decreased sensation to soft touch in toes and anterior foot  Nl sens to sharp/pressure /temperature  No muscular /strength changes   Psychiatric:        Mood and Affect: Mood normal.           Assessment & Plan:   Problem List Items Addressed This Visit      Other   Paresthesia of foot, bilateral - Primary    Since 2007- equal in all toes (not hands)  Now feet- to ankles  No falls or injuries Strongly suspect peripheral neuropathy  In the past nl esr and B12 and A1C Nl TSH and vit D  Ref to neurology for eval and likely NCV/emg if needed       Relevant Orders   Ambulatory referral to Neurology

## 2019-10-05 NOTE — Assessment & Plan Note (Signed)
Since 2007- equal in all toes (not hands)  Now feet- to ankles  No falls or injuries Strongly suspect peripheral neuropathy  In the past nl esr and B12 and A1C Nl TSH and vit D  Ref to neurology for eval and likely NCV/emg if needed

## 2019-10-07 ENCOUNTER — Ambulatory Visit: Payer: PPO

## 2019-10-12 ENCOUNTER — Encounter: Payer: Self-pay | Admitting: Certified Nurse Midwife

## 2019-10-16 ENCOUNTER — Ambulatory Visit: Payer: PPO

## 2019-11-10 ENCOUNTER — Ambulatory Visit: Payer: PPO | Admitting: Neurology

## 2019-11-10 ENCOUNTER — Encounter: Payer: Self-pay | Admitting: Neurology

## 2019-11-10 VITALS — BP 98/68 | HR 66 | Temp 97.0°F | Ht 65.0 in | Wt 179.0 lb

## 2019-11-10 DIAGNOSIS — E039 Hypothyroidism, unspecified: Secondary | ICD-10-CM

## 2019-11-10 DIAGNOSIS — L218 Other seborrheic dermatitis: Secondary | ICD-10-CM

## 2019-11-10 DIAGNOSIS — G629 Polyneuropathy, unspecified: Secondary | ICD-10-CM

## 2019-11-10 DIAGNOSIS — K581 Irritable bowel syndrome with constipation: Secondary | ICD-10-CM

## 2019-11-10 DIAGNOSIS — G603 Idiopathic progressive neuropathy: Secondary | ICD-10-CM

## 2019-11-10 NOTE — Progress Notes (Signed)
Provider:  Larey Seat, M D  Referring Provider: Abner Greenspan, MD Primary Care Physician:  [name], Morgan Fanny, MD  Chief Complaint  Patient presents with  . New Patient (Initial Visit)    pt with husband, rm 24. pt states Feb 2007 developed numbness in 2nd toe RT foot. 6 mths later they were all numb leading into both feet. now she has developed numbness sensation in bottom bilateral feet and in the back of legs. when she first developed this ncv/emg was completed 2007. she states never had a diagnosis from that.     HPI:  Morgan [name] is a 74 y.o. female , and seen in a new visit after more that 3 year- Interval. She has been released from Dr Morgan [name] , who had seen her for connective tissue disease.  Morgan [name] had been seen here a little bit over 3 years ago and at the time she had decreased sensation to soft touch in her toes and the anterior foot originally it only affected to toes but now has progressed to all 10 and both feet.  She has a newer development which is a numbness and in the back of the gastrocnemius muscle with these paresthesias are not just foot paresthesias that actually ascended towards the knee they have not crossed the knee and there is no such abnormal sensation in her hands.  Dr Morgan [name] wanted her to have NCV and EMG.      08-2016, here as a referral from Dr. Jerilynn [name]. [name] for a neuropathy work up. She is the spouse of an established patient and requested this referral.   Chief complaint according to patient :  Patient reports that she started having numbness in her toes starting in 2007. Now has numbness in the bottom of her feet, coldness in toes, and now has coolness in her hands.     Morgan [name] reports progressive numbness beginning about 10 years ago. It has affected the most peripheral body parts at first her toes. Since then it has progressed slightly but the numbness has become more profound. She does not experience pain she does not experience pin and  needle dysesthesias for example. She has been checked for endocrine disorders and has  hypothyroidism , but not  Diabetes.  Nodular thyroid is followed endocrinology for the last 5 years.  When she was pregnant with her twin boys she also did not have neuropathic symptoms yet. 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  She has followed her rheumatologist, Dr. Estanislado [name].  She has undergone repeated antinuclear antibody testing, protein electrophoresis, and metabolic panels to check if any endocrine disorder had developed meanwhile. I have no doubt that she has a sensory neuropathy. Her hands have become cold , too.  She ost hair on her legs and toes. Her skin has become shiny . She is  sweating on either palm or soles.    Social history:  Married, twin sons  58 years old. , another 3 rd son 69  and a daughter 59  Review of Systems: Out of a complete 14 system review, the patient complains of only the following symptoms, and all other reviewed systems are negative.  Numbness in toes , with loss of vibration but only decreased sense of pin prick and fine touch.  She now has a bit of pain -    Social History   Socioeconomic History  . Marital status: Married  Spouse name: Not on file  . Number of children: 4  . Years of education: HS  . Highest education level: Not on file  Occupational History  . Occupation: Retired   Tobacco Use  . Smoking status: Former Smoker    Types: Cigarettes    Quit date: 07/22/1965    Years since quitting: 54.3  . Smokeless tobacco: Never Used  Substance and Sexual Activity  . Alcohol use: No  . Drug use: No  . Sexual activity: Yes    Birth control/protection: Surgical    Comment: BTL  Other Topics Concern  . Not on file  Social History Narrative   + caffeine use     Social Determinants of Health   Financial Resource Strain:   . Difficulty of Paying Living Expenses:   Food  Insecurity:   . Worried About Charity fundraiser in the Last Year:   . Arboriculturist in the Last Year:   Transportation Needs:   . Film/video editor (Medical):   Marland Kitchen Lack of Transportation (Non-Medical):   Physical Activity:   . Days of Exercise per Week:   . Minutes of Exercise per Session:   Stress:   . Feeling of Stress :   Social Connections:   . Frequency of Communication with Friends and Family:   . Frequency of Social Gatherings with Friends and Family:   . Attends Religious Services:   . Active Member of Clubs or Organizations:   . Attends Archivist Meetings:   Marland Kitchen Marital Status:   Intimate Partner Violence:   . Fear of Current or Ex-Partner:   . Emotionally Abused:   Marland Kitchen Physically Abused:   . Sexually Abused:     Family History  Problem Relation Age of Onset  . Coronary artery disease Father   . Heart attack Father        deceased at 22  . Dementia Mother   . Diabetes Maternal Grandmother   . Goiter Maternal Grandmother   . Diabetes Cousin   . Cancer Grandchild        rare type (dying at age 92)  . Colon cancer Neg Hx   . Esophageal cancer Neg Hx   . Stomach cancer Neg Hx   . Rectal cancer Neg Hx     Past Medical History:  Diagnosis Date  . Allergy   . Bradycardia   . Connective tissue disease ([place])   . DDD (degenerative disc disease), cervical   . DDD (degenerative disc disease), lumbar   . Endometrial polyp    not resolved with D&C (following)  . GERD (gastroesophageal reflux disease)    gas/belching issues-was on protonix for short time-not since 6 months  . HLD (hyperlipidemia)   . Mixed connective tissue disease ([place]) diagnosed 5 years ago   no problems since diagnosis  . Multiple thyroid nodules 01/24/2014  . Obesity   . Osteoarthritis of cervical spine   . PONV (postoperative nausea and vomiting)   . Renal calculi   . Spinal stenosis     Past Surgical History:  Procedure Laterality Date  . APPENDECTOMY    . COLONOSCOPY      . DILATION AND CURETTAGE OF UTERUS     fibroids  . DILATION AND CURETTAGE OF UTERUS  01/2005   endometrial polyp  . ENDOMETRIAL BIOPSY  2008   fibroid  . HAND SURGERY     carpal tunnel release bilateral Dr. Daylene Katayama  . hysteroscopic resection  polyps of PMB  . TONSILLECTOMY    . TUBAL LIGATION     1973  . UPPER GASTROINTESTINAL ENDOSCOPY      Current Outpatient Medications  Medication Sig Dispense Refill  . Ascorbic Acid (VITAMIN C) 1000 MG tablet Take 1,000 mg by mouth daily.    Marland Kitchen azelastine (ASTELIN) 0.1 % nasal spray     . Cholecalciferol (VITAMIN D-3) 1000 units CAPS Take daily by mouth.    . Clobetasol Propionate Emulsion 0.05 % topical foam Apply 1 application topically 2 (two) times a week.     . diclofenac sodium (VOLTAREN) 1 % GEL 3 grams to 3 large joints up to 3 times daily 3 Tube 3  . fluticasone (FLONASE) 50 MCG/ACT nasal spray     . ibuprofen (ADVIL,MOTRIN) 200 MG tablet Take 400 mg by mouth as needed.    . Lactobacillus Rhamnosus, GG, (CULTURELLE PO) Take 1 tablet by mouth daily. Culturelle    . levothyroxine (SYNTHROID, LEVOTHROID) 50 MCG tablet Take 50 mcg 3 times a week (Mon, Wed, Fri),25 mcg 4 times a week (Tues, [place], Sat, Sun)    . LYSINE PO Take 50 mg by mouth daily.     . polyethylene glycol (MIRALAX / GLYCOLAX) packet You may use this as needed for more acute constipation.  Follow label instructions. 14 each 0  . Prenatal Vit-Fe Fumarate-FA (PRENATAL PO) Take 1 tablet by mouth daily.    . valACYclovir (VALTREX) 1000 MG tablet 2 tablets twice daily for 1 day for fever blisters 30 tablet 6  . Zinc 50 MG TABS Take by mouth.     No current facility-administered medications for this visit.    Allergies as of 11/10/2019 - Review Complete 10/05/2019  Allergen Reaction Noted  . Codeine Nausea And Vomiting 01/19/2007  . Hydrocodone-acetaminophen  01/19/2007  . Penicillins Hives 01/19/2007    Vitals: BP 98/68   Pulse 66   Temp (!) 97 F (36.1 C)   Ht  5\' 5"  (1.651 m)   Wt 179 lb (81.2 kg)   LMP 07/22/1989 (Approximate)   BMI 29.79 kg/m  Last Weight:  Wt Readings from Last 1 Encounters:  11/10/19 179 lb (81.2 kg)   [id number] mass index is 29.79 kg/m.     Last Height:   Ht Readings from Last 1 Encounters:  11/10/19 5\' 5"  (1.651 m)    Physical exam:  General: The patient is awake, alert and appears not in acute distress. The patient is well groomed. Head: Normocephalic, atraumatic.  Cardiovascular:  Regular rate and rhythm , without  murmurs or carotid bruit, and without distended neck veins. Respiratory: Lungs are clear to auscultation. Skin:  Without evidence of edema, or rash Trunk: BMI is 29.  The patient's posture is erect   Neurologic exam : The patient is awake and alert, oriented to place and time.    MOCA:No flowsheet data found. MMSE: MMSE - Mini Mental State Exam 01/06/2018 12/04/2016 09/22/2015  Orientation to time 5 5 5   Orientation to Place 5 5 5   Registration 3 3 3   Attention/ Calculation 0 0 5  Recall 3 3 3   Language- name 2 objects 0 0 0  Language- repeat 1 1 1   Language- follow 3 step command 3 3 3   Language- read & follow direction 0 0 1  Write a sentence 0 0 0  Copy design 0 0 0  Total score 20 20 26    Attention span & concentration ability appears normal.  Speech is fluent,  without dysarthria, dysphonia or aphasia.  Mood and affect are appropriate.  Cranial nerves: Pupils are equal and briskly reactive to light. Funduscopic exam withoutevidence of pallor or edema. Extraocular movements  in vertical and horizontal planes intact and without nystagmus. Visual fields by finger perimetry are intact. Hearing to finger rub intact. Facial sensation intact to fine touch.Facial motor strength is symmetric and tongue and uvula move midline. Shoulder shrug was symmetrical.   Motor exam: Normal tone, muscle bulk and symmetric strength in all extremities. Sensory:   Fine touch, pinprick and vibration were tested  in all extremities- and her feet are definitely numb.  Proprioception tested in the upper extremities was normal. Coordination: Rapid alternating movements in the fingers/hands was normal.  Finger-to-nose maneuver  normal without evidence of ataxia, dysmetria or tremor. Gait and station: Patient walks without assistive device and is able unassisted to climb up to the exam table. Strength within normal limits.Stance is stable and normal.  Toe and heel stand were tested .Tandem gait is unfragmented.  Turns with 3 Steps.  Romberg testing is negative.  Deep tendon reflexes: in the upper and lower extremities are symmetric and intact. Babinski maneuver response is downgoing.  The patient was advised of the nature of the diagnosed sleep disorder , the treatment options and risks for general a health and wellness arising from not treating the condition.  I spent more than 30 minutes of face to face time with the patient. Greater than 50% of time was spent in counseling and coordination of care. We have discussed the diagnosis and differential and I answered the patient's questions.     Assessment:  After physical and neurologic examination, review of laboratory studies,  Personal review of imaging studies, reports of testing and pre-existing records as far as provided in visit., my assessment is   1) slow progression of a peripheral sensory  Neuropathy. There may be a slight dysautonomic component as she has lost hair growth on her legs , the skin is more shiny. She developed the first symptoms over 10 years ago , 5 years before she was diagnosed with hypothyroidism. She was diagnosed with an unspecified connective tissue disorder and has been followed by rheumatology since 2007. These neuropathies are small fiber related, and cannot be diagnosed by NCV,   Plan:  Treatment plan and additional workup :  I recommended a Vit B complex, Methylated B 12, probiotic, Vit D and Biotin.   If she should  develop pain , we can treat with Lyrica or Gabapentin. PRN RV   Asencion Partridge Maimuna Leaman MD  11/10/2019   CC: [address] [place], [place],  [place] [address]

## 2019-11-14 LAB — PROTEIN ELECTROPHORESIS, SERUM
A/G Ratio: 1.7 (ref 0.7–1.7)
Albumin ELP: 4 g/dL (ref 2.9–4.4)
Alpha 1: 0.2 g/dL (ref 0.0–0.4)
Alpha 2: 0.7 g/dL (ref 0.4–1.0)
Beta: 0.9 g/dL (ref 0.7–1.3)
Gamma Globulin: 0.5 g/dL (ref 0.4–1.8)
Globulin, Total: 2.4 g/dL (ref 2.2–3.9)
Total Protein: 6.4 g/dL (ref 6.0–8.5)

## 2019-11-14 LAB — ANA W/REFLEX IF POSITIVE: Anti Nuclear Antibody (ANA): NEGATIVE

## 2019-11-14 LAB — C-REACTIVE PROTEIN: CRP: 2 mg/L (ref 0–10)

## 2019-11-14 LAB — ANGIOTENSIN CONVERTING ENZYME: Angio Convert Enzyme: 59 U/L (ref 14–82)

## 2019-11-14 LAB — SEDIMENTATION RATE: Sed Rate: 2 mm/hr (ref 0–40)

## 2019-11-14 NOTE — Progress Notes (Signed)
Very good, normal results for neuropathy relevant labs. Inflammatory markers were negative. CD

## 2019-11-15 ENCOUNTER — Telehealth: Payer: Self-pay | Admitting: Neurology

## 2019-11-15 ENCOUNTER — Encounter: Payer: Self-pay | Admitting: Neurology

## 2019-11-15 NOTE — Telephone Encounter (Signed)
-----   Message from Larey Seat, MD sent at 11/14/2019 12:51 PM EDT ----- Very good, normal results for neuropathy relevant labs. Inflammatory markers were negative. CD

## 2019-11-15 NOTE — Telephone Encounter (Signed)
Called the patient to discuss. There was no answer. LVM informing the patient of the normal lab results. Advised I would also send a mychart message and if the patient had any questions, she could contact our office.

## 2019-11-24 NOTE — Telephone Encounter (Signed)
Pt returned call and I was able to advise of the normal lab results. Patient was appreciative.

## 2019-12-06 ENCOUNTER — Other Ambulatory Visit: Payer: Self-pay

## 2019-12-06 ENCOUNTER — Encounter: Payer: PPO | Admitting: Neurology

## 2019-12-06 ENCOUNTER — Ambulatory Visit (INDEPENDENT_AMBULATORY_CARE_PROVIDER_SITE_OTHER): Payer: PPO | Admitting: Neurology

## 2019-12-06 ENCOUNTER — Ambulatory Visit: Payer: PPO | Admitting: Neurology

## 2019-12-06 DIAGNOSIS — G629 Polyneuropathy, unspecified: Secondary | ICD-10-CM

## 2019-12-06 DIAGNOSIS — K581 Irritable bowel syndrome with constipation: Secondary | ICD-10-CM

## 2019-12-06 DIAGNOSIS — G603 Idiopathic progressive neuropathy: Secondary | ICD-10-CM | POA: Diagnosis not present

## 2019-12-06 DIAGNOSIS — L218 Other seborrheic dermatitis: Secondary | ICD-10-CM

## 2019-12-06 DIAGNOSIS — Z0289 Encounter for other administrative examinations: Secondary | ICD-10-CM

## 2019-12-06 DIAGNOSIS — E039 Hypothyroidism, unspecified: Secondary | ICD-10-CM

## 2019-12-06 NOTE — Procedures (Signed)
       Full Name: Renie "[name]" Farrel Conners Gender: Female MRN #: [id number] Date of Birth: 1946-01-08    Visit Date: 12/06/2019 14:55 Age: 74 Years Examining Physician: Sarina Ill, MD  Referring Physician: Larey Seat, MD Height: 5 feet 5 inch    History: Idiopathic progressive lower extremity symmetrical sensory changes in the toes and feet; neuropathy.  Summary: EMG/NCS was performed on the right lower extremity. All nerves and muscles (as indicated in the following tables) were within normal limits.    Conclusion: This is a normal study of the right lower extremity. No evidence of mononeuropathy, large-fiber neuropathy or radiculopathy. However, a small-fiber neuropathy could cause patient's symptoms and evade detection by this exam.  ------------------------------- Sarina Ill, M.D.  [facility] [address] [place], [place] [address] Tel: [phone] Fax: [phone]  Verbal informed consent was obtained from the patient, patient was informed of potential risk of procedure, including bruising, bleeding, hematoma formation, infection, muscle weakness, muscle pain, numbness, among others.        [place]    Nerve / Sites Muscle Latency Ref. Amplitude Ref. Rel Amp Segments Distance Velocity Ref. Area    ms ms mV mV %  cm m/s m/s mVms  R Peroneal - EDB     Ankle EDB 4.6 ?6.5 4.3 ?2.0 100 Ankle - EDB 9   14.0     Fib head EDB 10.4  3.4  79.9 Fib head - Ankle 28 49 ?44 15.5     Pop fossa EDB 12.5  3.5  102 Pop fossa - Fib head 10 46 ?44 16.5         Pop fossa - Ankle      R Tibial - AH     Ankle AH 4.5 ?5.8 6.3 ?4.0 100 Ankle - AH 9   19.4     Pop fossa AH 13.1  3.2  49.9 Pop fossa - Ankle 38 44 ?41 13.3         SNC    Nerve / Sites Rec. Site Peak Lat Ref.  Amp Ref. Segments Distance    ms ms V V  cm  R Sural - Ankle (Calf)     Calf Ankle 3.8 ?4.4 7 ?6 Calf - Ankle 14  R Superficial peroneal - Ankle     Lat leg Ankle 4.0 ?4.4 7 ?6 Lat leg  - Ankle 14         F  Wave    Nerve F Lat Ref.   ms ms  R Tibial - AH 55.9 ?56.0       EMG Summary Table    Spontaneous MUAP Recruitment  Muscle IA Fib PSW Fasc Other Amp Dur. Poly Pattern  R. Vastus medialis Normal None None None _______ Normal Normal Normal Normal  R. Gastrocnemius (Medial head) Normal None None None _______ Normal Normal Normal Normal  R. Tibialis anterior Normal None None None _______ Normal Normal Normal Normal  R. Extensor hallucis longus Normal None None None _______ Normal Normal Normal Normal  R. Abductor hallucis Normal None None None _______ Normal Normal Normal Normal  R. Biceps femoris (long head) Normal None None None _______ Normal Normal Normal Normal  R. Gluteus maximus Normal None None None _______ Normal Normal Normal Normal  R. Gluteus medius Normal None None None _______ Normal Normal Normal Normal  R. Lumbar paraspinals (low) Normal None None None _______ Normal Normal Normal Normal

## 2019-12-06 NOTE — Progress Notes (Signed)
       Full Name: Zayde "[name]" Farrel Conners Gender: Female MRN #: [id number] Date of Birth: March 07, 1946    Visit Date: 12/06/2019 14:55 Age: 74 Years Examining Physician: Sarina Ill, MD  Referring Physician: Larey Seat, MD Height: 5 feet 5 inch    History: Idiopathic progressive lower extremity symmetrical sensory changes in the toes and feet; neuropathy.  Summary: EMG/NCS was performed on the right lower extremity. All nerves and muscles (as indicated in the following tables) were within normal limits.    Conclusion: This is a normal study of the right lower extremity. No evidence of mononeuropathy, large-fiber neuropathy or radiculopathy. However, a small-fiber neuropathy could cause patient's symptoms and evade detection by this exam.  ------------------------------- Sarina Ill, M.D.  [facility] [address], [place], [place] [address] Tel: [phone] Fax: [phone]  Verbal informed consent was obtained from the patient, patient was informed of potential risk of procedure, including bruising, bleeding, hematoma formation, infection, muscle weakness, muscle pain, numbness, among others.        [place]    Nerve / Sites Muscle Latency Ref. Amplitude Ref. Rel Amp Segments Distance Velocity Ref. Area    ms ms mV mV %  cm m/s m/s mVms  R Peroneal - EDB     Ankle EDB 4.6 ?6.5 4.3 ?2.0 100 Ankle - EDB 9   14.0     Fib head EDB 10.4  3.4  79.9 Fib head - Ankle 28 49 ?44 15.5     Pop fossa EDB 12.5  3.5  102 Pop fossa - Fib head 10 46 ?44 16.5         Pop fossa - Ankle      R Tibial - AH     Ankle AH 4.5 ?5.8 6.3 ?4.0 100 Ankle - AH 9   19.4     Pop fossa AH 13.1  3.2  49.9 Pop fossa - Ankle 38 44 ?41 13.3         SNC    Nerve / Sites Rec. Site Peak Lat Ref.  Amp Ref. Segments Distance    ms ms V V  cm  R Sural - Ankle (Calf)     Calf Ankle 3.8 ?4.4 7 ?6 Calf - Ankle 14  R Superficial peroneal - Ankle     Lat leg Ankle 4.0 ?4.4 7 ?6 Lat leg  - Ankle 14         F  Wave    Nerve F Lat Ref.   ms ms  R Tibial - AH 55.9 ?56.0       EMG Summary Table    Spontaneous MUAP Recruitment  Muscle IA Fib PSW Fasc Other Amp Dur. Poly Pattern  R. Vastus medialis Normal None None None _______ Normal Normal Normal Normal  R. Gastrocnemius (Medial head) Normal None None None _______ Normal Normal Normal Normal  R. Tibialis anterior Normal None None None _______ Normal Normal Normal Normal  R. Extensor hallucis longus Normal None None None _______ Normal Normal Normal Normal  R. Abductor hallucis Normal None None None _______ Normal Normal Normal Normal  R. Biceps femoris (long head) Normal None None None _______ Normal Normal Normal Normal  R. Gluteus maximus Normal None None None _______ Normal Normal Normal Normal  R. Gluteus medius Normal None None None _______ Normal Normal Normal Normal  R. Lumbar paraspinals (low) Normal None None None _______ Normal Normal Normal Normal

## 2019-12-06 NOTE — Progress Notes (Signed)
See procedure note.

## 2019-12-07 NOTE — Progress Notes (Signed)
History: Idiopathic progressive lower extremity symmetrical  sensory changes in the toes and feet; neuropathy.   Summary: EMG/NCS was performed on the right lower extremity. All  nerves and muscles (as indicated in the following tables) were  within normal limits.    Conclusion: This is a normal study of the right lower extremity.  No evidence of mononeuropathy, large-fiber neuropathy or  radiculopathy. However, a small-fiber neuropathy could cause  patient's symptoms and evade detection by this exam.

## 2019-12-08 ENCOUNTER — Encounter: Payer: Self-pay | Admitting: Neurology

## 2019-12-08 ENCOUNTER — Telehealth: Payer: Self-pay | Admitting: Neurology

## 2019-12-08 NOTE — Telephone Encounter (Signed)
Called the patient to review her NCV and EMG  Results. There was no answer. Will send a mychart message.

## 2019-12-08 NOTE — Telephone Encounter (Signed)
-----   Message from Larey Seat, MD sent at 12/07/2019  9:08 AM EDT ----- History: Idiopathic progressive lower extremity symmetrical  sensory changes in the toes and feet; neuropathy.   Summary: EMG/NCS was performed on the right lower extremity. All  nerves and muscles (as indicated in the following tables) were  within normal limits.    Conclusion: This is a normal study of the right lower extremity.  No evidence of mononeuropathy, large-fiber neuropathy or  radiculopathy. However, a small-fiber neuropathy could cause  patient's symptoms and evade detection by this exam.

## 2019-12-09 NOTE — Telephone Encounter (Signed)
Pt returned call and I was able to review the NCV/EMG finding. Pt verbalized understanding. Pt had no questions at this time but was encouraged to call back if questions arise. Pt at this time will follow with the neuro surgery office to discuss in more detail.

## 2019-12-27 ENCOUNTER — Other Ambulatory Visit: Payer: Self-pay | Admitting: Dermatology

## 2019-12-27 ENCOUNTER — Other Ambulatory Visit: Payer: Self-pay

## 2019-12-27 MED ORDER — TRIAMCINOLONE ACETONIDE 0.1 % EX CREA: 1.0000 "application " | TOPICAL_CREAM | Freq: Two times a day (BID) | CUTANEOUS | 2 refills | Status: DC | PRN

## 2019-12-27 MED ORDER — CLOBETASOL PROPIONATE 0.05 % EX FOAM: Freq: Two times a day (BID) | CUTANEOUS | 3 refills | Status: DC

## 2019-12-27 MED ORDER — HYDROCORTISONE VALERATE 0.2 % EX CREA: TOPICAL_CREAM | Freq: Two times a day (BID) | CUTANEOUS | 3 refills | Status: DC

## 2019-12-27 MED ORDER — HALOBETASOL PROPIONATE 0.05 % EX CREA: TOPICAL_CREAM | Freq: Two times a day (BID) | CUTANEOUS | 2 refills | Status: AC | PRN

## 2019-12-27 NOTE — Telephone Encounter (Signed)
Pt called requesting refills Clobetasol foam she uses on her scalp for dermatitis, Triamcinolone cream she spot treat on her legs, Halobetasol cream she uses when she have a bad flare on her legs, Hydrocortisone  Valerate cream she uses for flares on her face, pt request small tubes   OK erx'd to [place] Triamcinolone cream 15 gm  Halobetasol cream 30 gm Hydrocortisone  Valerate cream 15 gm Clobetasol foam 50 gm

## 2019-12-28 ENCOUNTER — Other Ambulatory Visit: Payer: Self-pay

## 2019-12-28 MED ORDER — CLOBETASOL PROPIONATE 0.05 % EX FOAM
Freq: Two times a day (BID) | CUTANEOUS | 3 refills | Status: DC
Start: 2019-12-28 — End: 2020-03-01

## 2020-02-02 DIAGNOSIS — E042 Nontoxic multinodular goiter: Secondary | ICD-10-CM | POA: Diagnosis not present

## 2020-02-02 DIAGNOSIS — E061 Subacute thyroiditis: Secondary | ICD-10-CM | POA: Diagnosis not present

## 2020-02-02 DIAGNOSIS — N959 Unspecified menopausal and perimenopausal disorder: Secondary | ICD-10-CM | POA: Diagnosis not present

## 2020-02-02 DIAGNOSIS — E039 Hypothyroidism, unspecified: Secondary | ICD-10-CM | POA: Diagnosis not present

## 2020-02-02 DIAGNOSIS — Q842 Other congenital malformations of hair: Secondary | ICD-10-CM | POA: Diagnosis not present

## 2020-02-02 DIAGNOSIS — E6609 Other obesity due to excess calories: Secondary | ICD-10-CM | POA: Diagnosis not present

## 2020-02-02 DIAGNOSIS — D440 Neoplasm of uncertain behavior of thyroid gland: Secondary | ICD-10-CM | POA: Diagnosis not present

## 2020-02-06 ENCOUNTER — Encounter (HOSPITAL_BASED_OUTPATIENT_CLINIC_OR_DEPARTMENT_OTHER): Payer: Self-pay | Admitting: Emergency Medicine

## 2020-02-06 ENCOUNTER — Other Ambulatory Visit: Payer: Self-pay

## 2020-02-06 DIAGNOSIS — Z87891 Personal history of nicotine dependence: Secondary | ICD-10-CM | POA: Diagnosis not present

## 2020-02-06 DIAGNOSIS — Y939 Activity, unspecified: Secondary | ICD-10-CM | POA: Insufficient documentation

## 2020-02-06 DIAGNOSIS — Z79899 Other long term (current) drug therapy: Secondary | ICD-10-CM | POA: Diagnosis not present

## 2020-02-06 DIAGNOSIS — E039 Hypothyroidism, unspecified: Secondary | ICD-10-CM | POA: Insufficient documentation

## 2020-02-06 DIAGNOSIS — Y999 Unspecified external cause status: Secondary | ICD-10-CM | POA: Diagnosis not present

## 2020-02-06 DIAGNOSIS — X58XXXA Exposure to other specified factors, initial encounter: Secondary | ICD-10-CM | POA: Insufficient documentation

## 2020-02-06 DIAGNOSIS — S161XXA Strain of muscle, fascia and tendon at neck level, initial encounter: Secondary | ICD-10-CM | POA: Insufficient documentation

## 2020-02-06 DIAGNOSIS — Y929 Unspecified place or not applicable: Secondary | ICD-10-CM | POA: Insufficient documentation

## 2020-02-06 DIAGNOSIS — R141 Gas pain: Secondary | ICD-10-CM | POA: Insufficient documentation

## 2020-02-06 DIAGNOSIS — R001 Bradycardia, unspecified: Secondary | ICD-10-CM | POA: Diagnosis not present

## 2020-02-06 DIAGNOSIS — S199XXA Unspecified injury of neck, initial encounter: Secondary | ICD-10-CM | POA: Diagnosis present

## 2020-02-06 DIAGNOSIS — M542 Cervicalgia: Secondary | ICD-10-CM | POA: Diagnosis not present

## 2020-02-06 NOTE — ED Triage Notes (Signed)
Neck pain and belching for two days

## 2020-02-07 ENCOUNTER — Encounter (HOSPITAL_BASED_OUTPATIENT_CLINIC_OR_DEPARTMENT_OTHER): Payer: Self-pay | Admitting: Emergency Medicine

## 2020-02-07 ENCOUNTER — Emergency Department (HOSPITAL_BASED_OUTPATIENT_CLINIC_OR_DEPARTMENT_OTHER): Payer: PPO

## 2020-02-07 ENCOUNTER — Emergency Department (HOSPITAL_BASED_OUTPATIENT_CLINIC_OR_DEPARTMENT_OTHER)
Admission: EM | Admit: 2020-02-07 | Discharge: 2020-02-07 | Disposition: A | Discharge status: Home / Self Care | Payer: PPO | Attending: Emergency Medicine | Admitting: Emergency Medicine

## 2020-02-07 DIAGNOSIS — S161XXA Strain of muscle, fascia and tendon at neck level, initial encounter: Secondary | ICD-10-CM

## 2020-02-07 DIAGNOSIS — R141 Gas pain: Secondary | ICD-10-CM

## 2020-02-07 DIAGNOSIS — M542 Cervicalgia: Secondary | ICD-10-CM | POA: Diagnosis not present

## 2020-02-07 LAB — BASIC METABOLIC PANEL
Anion gap: 11 (ref 5–15)
BUN: 17 mg/dL (ref 8–23)
CO2: 27 mmol/L (ref 22–32)
Calcium: 9.6 mg/dL (ref 8.9–10.3)
Chloride: 106 mmol/L (ref 98–111)
Creatinine, Ser: 0.7 mg/dL (ref 0.44–1.00)
GFR calc Af Amer: >60 mL/min (ref >60)
GFR calc non Af Amer: >60 mL/min (ref >60)
Glucose, Bld: 102 mg/dL — ABNORMAL HIGH (ref 70–99)
Potassium: 4 mmol/L (ref 3.5–5.1)
Sodium: 144 mmol/L (ref 135–145)

## 2020-02-07 LAB — CBC WITH DIFFERENTIAL/PLATELET
Abs Immature Granulocytes: 0.01 10*3/uL (ref 0.00–0.07)
Basophils Absolute: 0 10*3/uL (ref 0.0–0.1)
Basophils Relative: 1 %
Eosinophils Absolute: 0.1 10*3/uL (ref 0.0–0.5)
Eosinophils Relative: 3 %
HCT: 41.4 % (ref 36.0–46.0)
Hemoglobin: 13.3 g/dL (ref 12.0–15.0)
Immature Granulocytes: 0 %
Lymphocytes Relative: 28 %
Lymphs Abs: 1.5 10*3/uL (ref 0.7–4.0)
MCH: 29.9 pg (ref 26.0–34.0)
MCHC: 32.1 g/dL (ref 30.0–36.0)
MCV: 93 fL (ref 80.0–100.0)
Monocytes Absolute: 0.7 10*3/uL (ref 0.1–1.0)
Monocytes Relative: 12 %
Neutro Abs: 3.1 10*3/uL (ref 1.7–7.7)
Neutrophils Relative %: 56 %
Platelets: 203 10*3/uL (ref 150–400)
RBC: 4.45 MIL/uL (ref 3.87–5.11)
RDW: 12.9 % (ref 11.5–15.5)
WBC: 5.5 10*3/uL (ref 4.0–10.5)
nRBC: 0 % (ref 0.0–0.2)

## 2020-02-07 LAB — TROPONIN I (HIGH SENSITIVITY): Troponin I (High Sensitivity): 5 ng/L (ref <18)

## 2020-02-07 MED ORDER — LIDOCAINE VISCOUS HCL 2 % MT SOLN
15.0000 mL | Freq: Once | OROMUCOSAL | Status: AC
  Administered 2020-02-07: 15 mL via ORAL
  Filled 2020-02-07: qty 15

## 2020-02-07 MED ORDER — LIDOCAINE 5 % EX PTCH
1.0000 | MEDICATED_PATCH | CUTANEOUS | Status: DC
  Administered 2020-02-07: 1 via TRANSDERMAL
  Filled 2020-02-07: qty 1

## 2020-02-07 MED ORDER — ALUM & MAG HYDROXIDE-SIMETH 200-200-20 MG/5ML PO SUSP
30.0000 mL | Freq: Once | ORAL | Status: AC
  Administered 2020-02-07: 30 mL via ORAL
  Filled 2020-02-07: qty 30

## 2020-02-07 MED ORDER — LIDOCAINE 5 % EX PTCH: 1.0000 | MEDICATED_PATCH | CUTANEOUS | 0 refills | Status: DC

## 2020-02-07 MED ORDER — OMEPRAZOLE 20 MG PO CPDR
20.0000 mg | DELAYED_RELEASE_CAPSULE | Freq: Every day | ORAL | 0 refills | Status: DC
Start: 2020-02-07 — End: 2020-03-02

## 2020-02-07 MED ORDER — IBUPROFEN 800 MG PO TABS
800.0000 mg | ORAL_TABLET | Freq: Once | ORAL | Status: AC
  Administered 2020-02-07: 800 mg via ORAL
  Filled 2020-02-07: qty 1

## 2020-02-07 NOTE — ED Provider Notes (Signed)
[facility] Provider Note   CSN: [id number] Arrival date & time: 02/06/20  2333     History Chief Complaint  Patient presents with  . Neck Pain    Morgan [name] is a 74 y.o. female.  The history is provided by the patient.  Neck Pain Pain location:  Occipital region Quality: tightness. Pain radiates to:  Does not radiate Pain severity:  Moderate Pain is:  Same all the time Onset quality:  Gradual Duration:  3 days Timing:  Constant Progression:  Unchanged Chronicity:  New Context: not fall, not jumping from heights and not lifting a heavy object   Relieved by:  Nothing Worsened by:  Nothing Ineffective treatments:  Anxiolytics Associated symptoms: no bladder incontinence, no bowel incontinence, no chest pain, no fever, no headaches, no leg pain, no numbness, no paresis, no photophobia, no syncope, no tingling, no visual change, no weakness and no weight loss   Risk factors: no hx of head and neck radiation   Patient also reports she is gassy and has been belching a lot and wants to get checked out.  No CP, no SOB.  No exertional pain or shortness of breath.  No n/v/d.  No weakness no numbness. No changes in vision or speech.  No changes in cognition.  No trauma.       Past Medical History:  Diagnosis Date  . Allergy   . Bradycardia   . Connective tissue disease ([place])   . DDD (degenerative disc disease), cervical   . DDD (degenerative disc disease), lumbar   . Endometrial polyp    not resolved with D&C (following)  . GERD (gastroesophageal reflux disease)    gas/belching issues-was on protonix for short time-not since 6 months  . HLD (hyperlipidemia)   . Mixed connective tissue disease ([place]) diagnosed 5 years ago   no problems since diagnosis  . Multiple thyroid nodules 01/24/2014  . Obesity   . Osteoarthritis of cervical spine   . PONV (postoperative nausea and vomiting)   . Renal calculi   . Spinal stenosis     Patient Active  Problem List   Diagnosis Date Noted  . Vitamin D deficiency 10/05/2019  . Neck pain 03/02/2019  . Colon cancer screening 02/09/2019  . Snoring 08/06/2017  . Nasal congestion 08/06/2017  . Paresthesia of foot, bilateral 08/12/2016  . Encounter for Medicare annual wellness exam 04/13/2014  . Allergic rhinitis 04/13/2014  . Routine general medical examination at a health care facility 04/07/2014  . Multiple thyroid nodules 01/24/2014  . History of small bowel obstruction 01/20/2014  . Low back strain 01/07/2014  . DDD (degenerative disc disease), cervical   . DDD (degenerative disc disease), lumbar   . Spinal stenosis   . Obese   . TINNITUS 06/08/2010  . NONSPECIFIC ABNORMAL TOXICOLOGICAL FINDINGS 05/11/2010  . Hypothyroidism 04/18/2010  . BRADYCARDIA 05/31/2008  . GERD 05/03/2008  . DYSPEPSIA 05/03/2008  . DIVERTICULOSIS OF COLON 04/26/2008  . HEPATITIS, HX OF 04/26/2008  . COLONIC POLYPS, HYPERPLASTIC, HX OF 04/26/2008  . HEMORRHOIDS, INTERNAL 01/11/2008  . DERMATITIS, SEBORRHEIC NEC 01/28/2007  . Hyperlipidemia 01/19/2007  . IBS 01/19/2007  . OVERACTIVE BLADDER 01/19/2007  . POSTMENOPAUSAL STATUS 01/19/2007  . ECZEMA 01/19/2007  . Osteoarthritis 01/19/2007  . ALLERGY 01/19/2007    Past Surgical History:  Procedure Laterality Date  . APPENDECTOMY    . COLONOSCOPY    . DILATION AND CURETTAGE OF UTERUS     fibroids  . DILATION AND CURETTAGE  OF UTERUS  01/2005   endometrial polyp  . ENDOMETRIAL BIOPSY  2008   fibroid  . HAND SURGERY     carpal tunnel release bilateral Dr. Daylene Katayama  . hysteroscopic resection      polyps of PMB  . TONSILLECTOMY    . TUBAL LIGATION     1973  . UPPER GASTROINTESTINAL ENDOSCOPY       OB History    Gravida  3   Para  3   Term  3   Preterm  0   AB  0   Living  4     SAB  0   TAB  0   Ectopic  0   Multiple  1   Live Births  4           Family History  Problem Relation Age of Onset  . Coronary artery disease  Father   . Heart attack Father        deceased at 56  . Dementia Mother   . Diabetes Maternal Grandmother   . Goiter Maternal Grandmother   . Diabetes Cousin   . Cancer Grandchild        rare type (dying at age 36)  . Colon cancer Neg Hx   . Esophageal cancer Neg Hx   . Stomach cancer Neg Hx   . Rectal cancer Neg Hx     Social History   Tobacco Use  . Smoking status: Former Smoker    Types: Cigarettes    Quit date: 07/22/1965    Years since quitting: 54.5  . Smokeless tobacco: Never Used  Vaping Use  . Vaping Use: Never used  Substance Use Topics  . Alcohol use: No  . Drug use: No    Home Medications Prior to Admission medications   Medication Sig Start Date End Date Taking? Authorizing Provider  Ascorbic Acid (VITAMIN C) 1000 MG tablet Take 1,000 mg by mouth daily.    [provider]  azelastine (ASTELIN) 0.1 % nasal spray  09/22/17   [provider]  Cholecalciferol (VITAMIN D-3) 1000 units CAPS Take daily by mouth.    [provider]  clobetasol (OLUX) 0.05 % topical foam APPLY TOPICALLY 2 TIMES DAILY. AVOID FACE, GROIN, AND AXILLA. 12/28/19   Moye, [place], MD  clobetasol (OLUX) 0.05 % topical foam Apply topically 2 (two) times daily. 12/28/19   Moye, [place], MD  Clobetasol Propionate Emulsion 0.05 % topical foam Apply 1 application topically 2 (two) times a week.  12/31/12   [provider]  diclofenac sodium (VOLTAREN) 1 % GEL 3 grams to 3 large joints up to 3 times daily 01/05/19   Bo Merino, MD  fluticasone Asencion Islam) 50 MCG/ACT nasal spray  09/22/17   [provider]  halobetasol (ULTRAVATE) 0.05 % cream APPLY A SMALL AMOUNT TO THE AFFECTED AREA TWICE DAILY AS NEEDED FOR FLARES. AVOID THE FACE/GROIN/ARMPITS 12/27/19   Moye, [place], MD  hydrocortisone valerate cream (WESTCORT) 0.2 % Apply topically 2 (two) times daily. 12/27/19   Moye, [place], MD  ibuprofen (ADVIL,MOTRIN) 200 MG tablet Take 400 mg by mouth as needed.     [provider]  Lactobacillus Rhamnosus, GG, (CULTURELLE PO) Take 1 tablet by mouth daily. Culturelle    [provider]  levothyroxine (SYNTHROID, LEVOTHROID) 50 MCG tablet Take 50 mcg 3 times a week (Mon, Wed, Fri),25 mcg 4 times a week (Tues, [place], Sat, Sun)    [provider]  lidocaine (LIDODERM) 5 % Place 1  patch onto the skin daily. Remove & Discard patch within 12 hours or as directed by MD 02/07/20   Randal Buba, Tanvir Hipple, MD  LYSINE PO Take 50 mg by mouth daily.     [provider]  omeprazole (PRILOSEC) 20 MG capsule Take 1 capsule (20 mg total) by mouth daily. 02/07/20   Danamarie Minami, MD  polyethylene glycol (MIRALAX / GLYCOLAX) packet You may use this as needed for more acute constipation.  Follow label instructions. 01/24/14   [name], PA-C  Prenatal Vit-Fe Fumarate-FA (PRENATAL PO) Take 1 tablet by mouth daily.    [provider]  triamcinolone cream (KENALOG) 0.1 % Apply 1 application topically 2 (two) times daily as needed (Rash). 12/27/19   Moye, [place], MD  valACYclovir (VALTREX) 1000 MG tablet 2 tablets twice daily for 1 day for fever blisters 06/05/17   Regina Eck, CNM  Zinc 50 MG TABS Take by mouth.    [provider]    Allergies    Codeine, Hydrocodone-acetaminophen, and Penicillins  Review of Systems   Review of Systems  Constitutional: Negative for diaphoresis, fever and weight loss.  HENT: Negative for congestion.   Eyes: Negative for photophobia and visual disturbance.  Respiratory: Negative for chest tightness and shortness of breath.   Cardiovascular: Negative for chest pain, leg swelling and syncope.  Gastrointestinal: Negative for abdominal pain and bowel incontinence.  Genitourinary: Negative for bladder incontinence and difficulty urinating.  Musculoskeletal: Positive for neck pain. Negative for arthralgias and neck stiffness.  Skin: Negative for rash.  Neurological: Negative for tingling,  weakness, numbness and headaches.  All other systems reviewed and are negative.   Physical Exam Updated Vital Signs BP 127/70   Pulse (!) 47   Temp 98.4 F (36.9 C)   Resp 20   Wt 80.9 kg   LMP 07/22/1989 (Approximate)   SpO2 99%   BMI 29.67 kg/m   Physical Exam Vitals and nursing note reviewed.  Constitutional:      General: She is not in acute distress.    Appearance: Normal appearance.  HENT:     Head: Normocephalic and atraumatic.     Nose: Nose normal.     Mouth/Throat:     Mouth: Mucous membranes are moist.  Eyes:     Conjunctiva/sclera: Conjunctivae normal.     Pupils: Pupils are equal, round, and reactive to light.  Neck:     Comments: Tight posterior neck muscles  Cardiovascular:     Rate and Rhythm: Normal rate and regular rhythm.     Pulses: Normal pulses.     Heart sounds: Normal heart sounds.  Pulmonary:     Effort: Pulmonary effort is normal.     Breath sounds: Normal breath sounds.  Abdominal:     Palpations: Abdomen is soft.     Tenderness: There is no abdominal tenderness. There is no guarding or rebound.     Comments: Gassy in the abdomen and into the chest cavity   Musculoskeletal:        General: Normal range of motion.     Cervical back: Normal range of motion. No rigidity.  Lymphadenopathy:     Cervical: No cervical adenopathy.     Right cervical: No deep or posterior cervical adenopathy.    Left cervical: No deep or posterior cervical adenopathy.  Skin:    General: Skin is warm and dry.     Capillary Refill: Capillary refill takes less than 2 seconds.  Neurological:     General:  No focal deficit present.     Mental Status: She is alert and oriented to person, place, and time.  Psychiatric:        Mood and Affect: Mood normal.     ED Results / Procedures / Treatments   Labs (all labs ordered are listed, but only abnormal results are displayed) Labs Reviewed  BASIC METABOLIC PANEL - Abnormal; Notable for the following components:        Result Value   Glucose, Bld 102 (*)    All other components within normal limits  CBC WITH DIFFERENTIAL/PLATELET  TROPONIN I (HIGH SENSITIVITY)  TROPONIN I (HIGH SENSITIVITY)    EKG EKG Interpretation  Date/Time:  Monday February 07 2020 01:03:09 EDT Ventricular Rate:  55 PR Interval:  154 QRS Duration: 82 QT Interval:  454 QTC Calculation: 434 R Axis:   -41 Text Interpretation: Sinus bradycardia Left axis deviation Confirmed by Randal Buba, Jaz Mallick ([id number]) on 02/07/2020 1:23:38 AM   Radiology DG Chest Portable 1 View  Result Date: 02/07/2020 CLINICAL DATA:  Neck pain x3 days. EXAM: PORTABLE CHEST 1 VIEW COMPARISON:  None. FINDINGS: There is no evidence of acute infiltrate, pleural effusion or pneumothorax. The heart size and mediastinal contours are within normal limits. The visualized skeletal structures are unremarkable. IMPRESSION: No active disease. Electronically Signed   By: Virgina Norfolk M.D.   On: 02/07/2020 01:52    Procedures Procedures (including critical care time)  Medications Ordered in ED Medications  ibuprofen (ADVIL) tablet 800 mg (has no administration in time range)  lidocaine (LIDODERM) 5 % 1 patch (has no administration in time range)  alum & mag hydroxide-simeth (MAALOX/MYLANTA) 200-200-20 MG/5ML suspension 30 mL (30 mLs Oral Given 02/07/20 0158)    And  lidocaine (XYLOCAINE) 2 % viscous mouth solution 15 mL (15 mLs Oral Given 02/07/20 0158)    ED Course  I have reviewed the triage vital signs and the nursing notes.  Pertinent labs & imaging results that were available during my care of the patient were reviewed by me and considered in my medical decision making (see chart for details).    Given the time course of symptoms (3 days continuously) patient has ruled out for MI in the ED.  HEARt score is 2 low risk for MACE.  She is clearly gassy and I believe this is some of what she feels.  Have advised diet change and will start PPI.  She has tight  muscles in the posterior neck and I will start lidoderm and advised tylenol.    Morgan [name] was evaluated in Emergency Department on 02/07/2020 for the symptoms described in the history of present illness. She was evaluated in the context of the global COVID-19 pandemic, which necessitated consideration that the patient might be at risk for infection with the SARS-CoV-2 virus that causes COVID-19. Institutional protocols and algorithms that pertain to the evaluation of patients at risk for COVID-19 are in a state of rapid change based on information released by regulatory bodies including the CDC and federal and state organizations. These policies and algorithms were followed during the patient's care in the ED.  Final Clinical Impression(s) / ED Diagnoses Final diagnoses:  Acute strain of neck muscle, initial encounter  Gas pain   Return for intractable cough, coughing up blood,fevers >100.4 unrelieved by medication, shortness of breath, intractable vomiting, chest pain, shortness of breath, weakness,numbness, changes in speech, facial asymmetry,abdominal pain, passing out,Inability to tolerate liquids or food, cough, altered mental status or any concerns. No  signs of systemic illness or infection. The patient is nontoxic-appearing on exam and vital signs are within normal limits.   I have reviewed the triage vital signs and the nursing notes. Pertinent labs &imaging results that were available during my care of the patient were reviewed by me and considered in my medical decision making (see chart for details).After history, exam, and medical workup I feel the patient has beenappropriately medically screened and is safe for discharge home. Pertinent diagnoses were discussed with the patient. Patient was given return precautions. Rx / DC Orders ED Discharge Orders         Ordered    omeprazole (PRILOSEC) 20 MG capsule  Daily     Discontinue  Reprint     02/07/20 0317    lidocaine  (LIDODERM) 5 %  Every 24 hours     Discontinue  Reprint     02/07/20 0317           Kiwanna Spraker, MD 02/07/20 [id number]

## 2020-02-08 ENCOUNTER — Ambulatory Visit: Payer: PPO | Admitting: Dermatology

## 2020-02-09 ENCOUNTER — Encounter: Payer: Self-pay | Admitting: Family Medicine

## 2020-02-09 ENCOUNTER — Other Ambulatory Visit: Payer: Self-pay

## 2020-02-09 ENCOUNTER — Ambulatory Visit (INDEPENDENT_AMBULATORY_CARE_PROVIDER_SITE_OTHER): Payer: PPO | Admitting: Family Medicine

## 2020-02-09 VITALS — BP 124/68 | HR 57 | Temp 96.9°F | Ht 65.0 in | Wt 178.6 lb

## 2020-02-09 DIAGNOSIS — R0789 Other chest pain: Secondary | ICD-10-CM | POA: Diagnosis not present

## 2020-02-09 DIAGNOSIS — K219 Gastro-esophageal reflux disease without esophagitis: Secondary | ICD-10-CM | POA: Diagnosis not present

## 2020-02-09 DIAGNOSIS — R142 Eructation: Secondary | ICD-10-CM | POA: Diagnosis not present

## 2020-02-09 DIAGNOSIS — R1013 Epigastric pain: Secondary | ICD-10-CM

## 2020-02-09 NOTE — Progress Notes (Signed)
Subjective:    Patient ID: Morgan [name], female    DOB: 1945-09-14, 74 y.o.   MRN: [id number]  This visit occurred during the SARS-CoV-2 public health emergency.  Safety protocols were in place, including screening questions prior to the visit, additional usage of staff PPE, and extensive cleaning of exam room while observing appropriate contact time as indicated for disinfecting solutions.    HPI Pt presents for f/u of ER visit   Wt Readings from Last 3 Encounters:  02/09/20 178 lb 9 oz (81 kg)  02/06/20 178 lb 4.8 oz (80.9 kg)  11/10/19 179 lb (81.2 kg)   29.71 kg/m   She was seen in [facility] for acute neck pain and also ? Upper GI symptoms   A/P from ER note:  Given the time course of symptoms (3 days continuously) patient has ruled out for MI in the ED.  HEARt score is 2 low risk for MACE.  She is clearly gassy and I believe this is some of what she feels.  Have advised diet change and will start PPI.  She has tight muscles in the posterior neck and I will start lidoderm and advised tylenol  Nl bp of 127/70  She was px prilosec and lidoderm patch ekg showed sinus bradycardia with rate of 55 and L axis deviation   DG Chest Portable 1 View  Result Date: 02/07/2020 CLINICAL DATA:  Neck pain x3 days. EXAM: PORTABLE CHEST 1 VIEW COMPARISON:  None. FINDINGS: There is no evidence of acute infiltrate, pleural effusion or pneumothorax. The heart size and mediastinal contours are within normal limits. The visualized skeletal structures are unremarkable. IMPRESSION: No active disease. Electronically Signed   By: Virgina Norfolk M.D.   On: 02/07/2020 01:52    Labs: Results for orders placed or performed during the hospital encounter of 02/07/20  CBC with Differential/Platelet  Result Value Ref Range   WBC 5.5 4.0 - 10.5 K/uL   RBC 4.45 3.87 - 5.11 MIL/uL   Hemoglobin 13.3 12.0 - 15.0 g/dL   HCT 41.4 36 - 46 %   MCV 93.0 80.0 - 100.0 fL   MCH 29.9 26.0 -  34.0 pg   MCHC 32.1 30.0 - 36.0 g/dL   RDW 12.9 11.5 - 15.5 %   Platelets 203 150 - 400 K/uL   nRBC 0.0 0.0 - 0.2 %   Neutrophils Relative % 56 %   Neutro Abs 3.1 1.7 - 7.7 K/uL   Lymphocytes Relative 28 %   Lymphs Abs 1.5 0.7 - 4.0 K/uL   Monocytes Relative 12 %   Monocytes Absolute 0.7 0 - 1 K/uL   Eosinophils Relative 3 %   Eosinophils Absolute 0.1 0 - 0 K/uL   Basophils Relative 1 %   Basophils Absolute 0.0 0 - 0 K/uL   Immature Granulocytes 0 %   Abs Immature Granulocytes 0.01 0.00 - 0.07 K/uL  Basic metabolic panel  Result Value Ref Range   Sodium 144 135 - 145 mmol/L   Potassium 4.0 3.5 - 5.1 mmol/L   Chloride 106 98 - 111 mmol/L   CO2 27 22 - 32 mmol/L   Glucose, Bld 102 (H) 70 - 99 mg/dL   BUN 17 8 - 23 mg/dL   Creatinine, Ser 0.70 0.44 - 1.00 mg/dL   Calcium 9.6 8.9 - 10.3 mg/dL   GFR calc non Af Amer >60 >60 mL/min   GFR calc Af Amer >60 >60 mL/min   Anion gap  11 5 - 15  Troponin I (High Sensitivity)  Result Value Ref Range   Troponin I (High Sensitivity) 5 <18 ng/L     Pt has been diagnosed with arthritis of neck in the past by orthopedics   Spells are still occurring Tightness in neck  Feels dizzy and develops pain in anterior neck - a pulling/drawing sensation  Symmetric on both sides  Then it radiates down to her upper chest   (last spell last night at 9:30) Last 5 minutes  Not exertional  No sob or nausea or sweating   Has gas/belching since her 30s Probiotics helped   09- had egd some gastritis and neg H pylori   prilosec - today is the first day she has taken it  Does not like taking medication    Lab Results  Component Value Date   CHOL 202 (H) 02/02/2019   HDL 50.30 02/02/2019   LDLCALC 135 (H) 02/02/2019   LDLDIRECT 148.8 10/09/2010   TRIG 86.0 02/02/2019   CHOLHDL 4 02/02/2019   Diet has not changed recently   Stress level- she cares for her husband (has balance issues and MCI)-he retired and now is home full time  She cannot  leave him alone any more   Father died of MI at 83 He did not take care of himself   Patient Active Problem List   Diagnosis Date Noted  . Belching 02/09/2020  . Chest pain 02/09/2020  . Vitamin D deficiency 10/05/2019  . Neck pain 03/02/2019  . Colon cancer screening 02/09/2019  . Snoring 08/06/2017  . Nasal congestion 08/06/2017  . Paresthesia of foot, bilateral 08/12/2016  . Encounter for Medicare annual wellness exam 04/13/2014  . Allergic rhinitis 04/13/2014  . Routine general medical examination at a health care facility 04/07/2014  . Multiple thyroid nodules 01/24/2014  . History of small bowel obstruction 01/20/2014  . Low back strain 01/07/2014  . DDD (degenerative disc disease), cervical   . DDD (degenerative disc disease), lumbar   . Spinal stenosis   . Obese   . TINNITUS 06/08/2010  . NONSPECIFIC ABNORMAL TOXICOLOGICAL FINDINGS 05/11/2010  . Hypothyroidism 04/18/2010  . BRADYCARDIA 05/31/2008  . GERD 05/03/2008  . Dyspepsia 05/03/2008  . DIVERTICULOSIS OF COLON 04/26/2008  . HEPATITIS, HX OF 04/26/2008  . COLONIC POLYPS, HYPERPLASTIC, HX OF 04/26/2008  . HEMORRHOIDS, INTERNAL 01/11/2008  . DERMATITIS, SEBORRHEIC NEC 01/28/2007  . Hyperlipidemia 01/19/2007  . IBS 01/19/2007  . OVERACTIVE BLADDER 01/19/2007  . POSTMENOPAUSAL STATUS 01/19/2007  . ECZEMA 01/19/2007  . Osteoarthritis 01/19/2007  . ALLERGY 01/19/2007   Past Medical History:  Diagnosis Date  . Allergy   . Bradycardia   . Connective tissue disease ([place])   . DDD (degenerative disc disease), cervical   . DDD (degenerative disc disease), lumbar   . Endometrial polyp    not resolved with D&C (following)  . GERD (gastroesophageal reflux disease)    gas/belching issues-was on protonix for short time-not since 6 months  . HLD (hyperlipidemia)   . Mixed connective tissue disease ([place]) diagnosed 5 years ago   no problems since diagnosis  . Multiple thyroid nodules 01/24/2014  . Obesity   .  Osteoarthritis of cervical spine   . PONV (postoperative nausea and vomiting)   . Renal calculi   . Spinal stenosis    Past Surgical History:  Procedure Laterality Date  . APPENDECTOMY    . COLONOSCOPY    . DILATION AND CURETTAGE OF UTERUS     fibroids  .  DILATION AND CURETTAGE OF UTERUS  01/2005   endometrial polyp  . ENDOMETRIAL BIOPSY  2008   fibroid  . HAND SURGERY     carpal tunnel release bilateral Dr. Daylene Katayama  . hysteroscopic resection      polyps of PMB  . TONSILLECTOMY    . TUBAL LIGATION     1973  . UPPER GASTROINTESTINAL ENDOSCOPY     Social History   Tobacco Use  . Smoking status: Former Smoker    Types: Cigarettes    Quit date: 07/22/1965    Years since quitting: 54.5  . Smokeless tobacco: Never Used  Vaping Use  . Vaping Use: Never used  Substance Use Topics  . Alcohol use: No  . Drug use: No   Family History  Problem Relation Age of Onset  . Coronary artery disease Father   . Heart attack Father        deceased at 72  . Dementia Mother   . Diabetes Maternal Grandmother   . Goiter Maternal Grandmother   . Diabetes Cousin   . Cancer Grandchild        rare type (dying at age 31)  . Colon cancer Neg Hx   . Esophageal cancer Neg Hx   . Stomach cancer Neg Hx   . Rectal cancer Neg Hx    Allergies  Allergen Reactions  . Codeine Nausea And Vomiting  . Hydrocodone-Acetaminophen     REACTION: reaction not known  . Penicillins Hives   Current Outpatient Medications on File Prior to Visit  Medication Sig Dispense Refill  . Ascorbic Acid (VITAMIN C) 1000 MG tablet Take 1,000 mg by mouth daily.    Marland Kitchen azelastine (ASTELIN) 0.1 % nasal spray     . Cholecalciferol (VITAMIN D-3) 1000 units CAPS Take daily by mouth.    . clobetasol (OLUX) 0.05 % topical foam APPLY TOPICALLY 2 TIMES DAILY. AVOID FACE, GROIN, AND AXILLA. 50 g 3  . clobetasol (OLUX) 0.05 % topical foam Apply topically 2 (two) times daily. 100 g 3  . Clobetasol Propionate Emulsion 0.05 % topical  foam Apply 1 application topically 2 (two) times a week.     . diclofenac sodium (VOLTAREN) 1 % GEL 3 grams to 3 large joints up to 3 times daily 3 Tube 3  . fluticasone (FLONASE) 50 MCG/ACT nasal spray     . halobetasol (ULTRAVATE) 0.05 % cream APPLY A SMALL AMOUNT TO THE AFFECTED AREA TWICE DAILY AS NEEDED FOR FLARES. AVOID THE FACE/GROIN/ARMPITS 50 g 0  . hydrocortisone valerate cream (WESTCORT) 0.2 % Apply topically 2 (two) times daily. 15 g 3  . ibuprofen (ADVIL,MOTRIN) 200 MG tablet Take 400 mg by mouth as needed.    . Lactobacillus Rhamnosus, GG, (CULTURELLE PO) Take 1 tablet by mouth daily. Culturelle    . levothyroxine (SYNTHROID, LEVOTHROID) 50 MCG tablet Take 50 mcg 3 times a week (Mon, Wed, Fri),25 mcg 4 times a week (Tues, [place], Sat, Sun)    . lidocaine (LIDODERM) 5 % Place 1 patch onto the skin daily. Remove & Discard patch within 12 hours or as directed by MD 30 patch 0  . LYSINE PO Take 50 mg by mouth daily.     Marland Kitchen omeprazole (PRILOSEC) 20 MG capsule Take 1 capsule (20 mg total) by mouth daily. 30 capsule 0  . polyethylene glycol (MIRALAX / GLYCOLAX) packet You may use this as needed for more acute constipation.  Follow label instructions. 14 each 0  . Prenatal Vit-Fe Fumarate-FA (PRENATAL  PO) Take 1 tablet by mouth daily.    Marland Kitchen triamcinolone cream (KENALOG) 0.1 % Apply 1 application topically 2 (two) times daily as needed (Rash). 15 g 2  . valACYclovir (VALTREX) 1000 MG tablet 2 tablets twice daily for 1 day for fever blisters 30 tablet 6  . Zinc 50 MG TABS Take by mouth.     No current facility-administered medications on file prior to visit.    Review of Systems  Constitutional: Negative for activity change, appetite change, fatigue, fever and unexpected weight change.  HENT: Negative for congestion, ear pain, rhinorrhea, sinus pressure and sore throat.   Eyes: Negative for pain, redness and visual disturbance.  Respiratory: Negative for cough, shortness of breath and  wheezing.   Cardiovascular: Positive for chest pain. Negative for palpitations and leg swelling.  Gastrointestinal: Negative for abdominal pain, blood in stool, constipation, diarrhea, nausea and vomiting.       Belching   Endocrine: Negative for polydipsia and polyuria.  Genitourinary: Negative for dysuria, frequency and urgency.  Musculoskeletal: Negative for arthralgias, back pain and myalgias.  Skin: Negative for pallor and rash.  Allergic/Immunologic: Negative for environmental allergies.  Neurological: Negative for dizziness, syncope and headaches.  Hematological: Negative for adenopathy. Does not bruise/bleed easily.  Psychiatric/Behavioral: Negative for decreased concentration and dysphoric mood. The patient is not nervous/anxious.        Objective:   Physical Exam Constitutional:      General: She is not in acute distress.    Appearance: Normal appearance. She is well-developed. She is obese. She is not ill-appearing or diaphoretic.  HENT:     Head: Normocephalic and atraumatic.     Mouth/Throat:     Mouth: Mucous membranes are moist.  Eyes:     General: No scleral icterus.    Conjunctiva/sclera: Conjunctivae normal.     Pupils: Pupils are equal, round, and reactive to light.  Cardiovascular:     Rate and Rhythm: Normal rate and regular rhythm.     Pulses: Normal pulses.     Heart sounds: Normal heart sounds.  Pulmonary:     Effort: Pulmonary effort is normal. No respiratory distress.     Breath sounds: Normal breath sounds. No wheezing or rales.  Abdominal:     General: Bowel sounds are normal. There is no distension.     Palpations: Abdomen is soft. There is no mass.     Tenderness: There is no abdominal tenderness. There is no right CVA tenderness, left CVA tenderness, guarding or rebound.     Hernia: No hernia is present.  Musculoskeletal:     Cervical back: Normal range of motion and neck supple.     Right lower leg: No edema.     Left lower leg: No edema.      Comments: No tenderness or swelling of legs  Lymphadenopathy:     Cervical: No cervical adenopathy.  Skin:    General: Skin is warm and dry.     Coloration: Skin is not pale.     Findings: No erythema.  Neurological:     Mental Status: She is alert.     Coordination: Coordination normal.     Deep Tendon Reflexes: Reflexes normal.  Psychiatric:        Mood and Affect: Mood normal.           Assessment & Plan:   Problem List Items Addressed This Visit      Digestive   GERD    Spells of neck/chest discomfort  may be due to this.   Reviewed ER note  Reviewed hospital records, lab results and studies in detail  Pt did improve initially with GI cocktail and had reassuring w/u  Adv to go ahead and continue the prilosec and update         Other   Dyspepsia    ? If recent spells of neck and chest discomfort may be due to this. High stress lately caring for husband  Had reassuring w/u in ER  Reviewed hospital records, lab results and studies in detail  She will proceed with plan to try prilosec and update me  Rev EGD in 09 showing gastritis      Belching    Maybe related to dyspepsia  Trial of prilosec  Has seen GI in the past      Chest pain - Primary    Atypical pressure feeling in anterior neck and upper chest - coming in spells with belching  Reassuring ER work up with neg cardiac enzymes and improved with gi cocktail  Suspect GI in nature and will start trial of omeprazole and update  If no improvement would consider GI referral (she has seen Dr Harrington Challenger in the past)

## 2020-02-09 NOTE — Patient Instructions (Signed)
Try the prilosec daily for at least 2 weeks  I think this will be both diagnostic or therapeutic - if it stops the spells then we know you have an acid problem   Stress can increase problems as well (even if you do not feel anxious)   You have a re assuring exam   If the spells persist-let me know  We can set you up with cardiology to check in

## 2020-02-09 NOTE — Assessment & Plan Note (Signed)
?   If recent spells of neck and chest discomfort may be due to this. High stress lately caring for husband  Had reassuring w/u in ER  Reviewed hospital records, lab results and studies in detail  She will proceed with plan to try prilosec and update me  Rev EGD in 09 showing gastritis

## 2020-02-09 NOTE — Assessment & Plan Note (Signed)
Maybe related to dyspepsia  Trial of prilosec  Has seen GI in the past

## 2020-02-09 NOTE — Assessment & Plan Note (Signed)
Atypical pressure feeling in anterior neck and upper chest - coming in spells with belching  Reassuring ER work up with neg cardiac enzymes and improved with gi cocktail  Suspect GI in nature and will start trial of omeprazole and update  If no improvement would consider GI referral (she has seen Dr Harrington Challenger in the past)

## 2020-02-09 NOTE — Assessment & Plan Note (Signed)
Spells of neck/chest discomfort may be due to this.   Reviewed ER note  Reviewed hospital records, lab results and studies in detail  Pt did improve initially with GI cocktail and had reassuring w/u  Adv to go ahead and continue the prilosec and update

## 2020-02-10 ENCOUNTER — Telehealth: Payer: Self-pay | Admitting: Family Medicine

## 2020-02-10 DIAGNOSIS — R0789 Other chest pain: Secondary | ICD-10-CM

## 2020-02-10 DIAGNOSIS — M542 Cervicalgia: Secondary | ICD-10-CM

## 2020-02-10 NOTE — Telephone Encounter (Signed)
Patient called and wanted to get a referral sent to cardiologist Fay Records so she can get a appointment asap. She stated she had 2 more sensations this morning and wants to go ahead and get seen by her. Please call patient once referral is sent over.

## 2020-02-10 NOTE — Telephone Encounter (Signed)
I placed a referral  Let her know that if Dr Harrington Challenger does not have availability soon she may have to see an extender first Thanks

## 2020-02-10 NOTE — Telephone Encounter (Signed)
Spoke with patient and notified of Dr Alba Cory advice, patient was on the other line with [facility] scheduling her appointment.

## 2020-02-14 NOTE — Telephone Encounter (Signed)
I would change asa to 81 mg and take with food  [place]-any chance she could get in earlier?    Thanks

## 2020-02-14 NOTE — Telephone Encounter (Signed)
Called Cardiology, no sooner appointments and she cant see PA as she is a New patient last seen 2009. Told patient to take 81 MG aspirin with food once a day

## 2020-02-14 NOTE — Telephone Encounter (Signed)
Thanks for checking-sounds like she is aware  If symptoms worsen/become severe do go to ER (if new symptoms please let us know)

## 2020-02-14 NOTE — Telephone Encounter (Signed)
Pt calling has tightness in chest that goes into the throat 1 - 2 times a day which started 01/30/20. The tightness in chest that goes into throat is the same with no improvement but also has not worsened. Pt has not had chest pain and does not have SOB. No H/A but has lightheadedness for brief period of 4 - 5 mins when has episode of tightness in chest. Pt does not have appt with card until 03/01/20. Pt has been taking prilosec for 6 days and does not think her symptoms are coming from GERD. Yesterday pt took ASA 325 mg and did not have an episode of tightness in her chest. Pt wants to know she should take ASA 325 mg daily until can see a cardiologist and pt wants to know if can get sooner appt with cardiology. Pt is willing to see a PA if that will get her seen sooner. [place].

## 2020-02-15 ENCOUNTER — Ambulatory Visit: Payer: PPO | Admitting: Neurology

## 2020-02-15 ENCOUNTER — Telehealth: Payer: Self-pay | Admitting: Gastroenterology

## 2020-02-15 DIAGNOSIS — E039 Hypothyroidism, unspecified: Secondary | ICD-10-CM | POA: Diagnosis not present

## 2020-02-15 DIAGNOSIS — Q842 Other congenital malformations of hair: Secondary | ICD-10-CM | POA: Diagnosis not present

## 2020-02-15 DIAGNOSIS — D440 Neoplasm of uncertain behavior of thyroid gland: Secondary | ICD-10-CM | POA: Diagnosis not present

## 2020-02-15 DIAGNOSIS — E042 Nontoxic multinodular goiter: Secondary | ICD-10-CM | POA: Diagnosis not present

## 2020-02-15 DIAGNOSIS — E061 Subacute thyroiditis: Secondary | ICD-10-CM | POA: Diagnosis not present

## 2020-02-15 DIAGNOSIS — N959 Unspecified menopausal and perimenopausal disorder: Secondary | ICD-10-CM | POA: Diagnosis not present

## 2020-02-15 DIAGNOSIS — E6609 Other obesity due to excess calories: Secondary | ICD-10-CM | POA: Diagnosis not present

## 2020-02-15 NOTE — Telephone Encounter (Signed)
Pt states that she has been dealing with chest pain. Pt reported that she went to the ED and they r/o that it is related to her heart. Pt states that she feels very blaoted, gassy and has pain in her stomach that radiates to her chest, neck and sometimes ear. Sxs began two weeks ago. Pt is wondering if it could be related to GI. Pt wants to be seen soom. Nothing available until 8/30 with APP.

## 2020-02-15 NOTE — Telephone Encounter (Signed)
Daughter called other nurse Koren Shiver. Patient is scheduled for 02/25/20 with an APP

## 2020-02-17 ENCOUNTER — Ambulatory Visit: Payer: PPO | Admitting: Neurology

## 2020-02-20 DIAGNOSIS — R079 Chest pain, unspecified: Secondary | ICD-10-CM | POA: Diagnosis not present

## 2020-02-20 DIAGNOSIS — R0789 Other chest pain: Secondary | ICD-10-CM | POA: Diagnosis not present

## 2020-02-24 ENCOUNTER — Telehealth: Payer: Self-pay | Admitting: Gastroenterology

## 2020-02-24 NOTE — Telephone Encounter (Signed)
Called the patient back. No answer. She has successfully gotten an appointment with Dr Silvio Pate.  Left her a voicemail with an apology for having to cancel the appointment.

## 2020-02-25 ENCOUNTER — Other Ambulatory Visit: Payer: Self-pay

## 2020-02-25 ENCOUNTER — Encounter: Payer: Self-pay | Admitting: Internal Medicine

## 2020-02-25 ENCOUNTER — Telehealth: Payer: Self-pay

## 2020-02-25 ENCOUNTER — Ambulatory Visit: Payer: PPO | Admitting: Nurse Practitioner

## 2020-02-25 ENCOUNTER — Ambulatory Visit (INDEPENDENT_AMBULATORY_CARE_PROVIDER_SITE_OTHER): Payer: PPO | Admitting: Internal Medicine

## 2020-02-25 DIAGNOSIS — R1013 Epigastric pain: Secondary | ICD-10-CM | POA: Diagnosis not present

## 2020-02-25 DIAGNOSIS — R0789 Other chest pain: Secondary | ICD-10-CM | POA: Diagnosis not present

## 2020-02-25 NOTE — Patient Instructions (Signed)
Please stop all the oral vitamins and supplements. Only continue the levothyroxine. Please increase the omeprazole to twice a day---morning and bedtime.

## 2020-02-25 NOTE — Telephone Encounter (Signed)
I will assess but sounds like she will need cardiac work up---just in case

## 2020-02-25 NOTE — Assessment & Plan Note (Signed)
Severe spells go back about 1 month ER evaluation negative and then EMS on 8/1---that EKG is unchanged and only shows non specific T wave flattening in anterolateral leads  History not really most consistent with coronary ischemia--but this needs to be considered

## 2020-02-25 NOTE — Progress Notes (Signed)
Subjective:    Patient ID: Morgan [name], female    DOB: [birth year], 74 y.o.   MRN: [id number]  HPI Here due to chest pain This visit occurred during the SARS-CoV-2 public health emergency.  Safety protocols were in place, including screening questions prior to the visit, additional usage of staff PPE, and extensive cleaning of exam room while observing appropriate contact time as indicated for disinfecting solutions.   Started around 7/9 At first, it would be in throat---felt tight and very painful (dull) It would even make it hard to speak Would finally subside after a few minutes  10 days later started with dull tightening pain in upper chest--to her neck Happens at different times of the day---might be 2 AM, during the day Not clearly related to food  ER in [facility] 7/18 Nurse heard gas EKG/labs benign---started prilosec  Then saw Dr Glori Bickers after A few days later---had more attacks Was referred to cardiology  No apparent change on the prilosec Still had 2 this morning EKG from EMS done 8/1--no change from 7/18  Does have gas and belching Attacks are not with activity Did cut out tomatoes and corn--in case that was a stimulus No dysphagia--but does have voice changes  Current Outpatient Medications on File Prior to Visit  Medication Sig Dispense Refill  . Ascorbic Acid (VITAMIN C) 1000 MG tablet Take 1,000 mg by mouth daily.    . Cholecalciferol (VITAMIN D-3) 1000 units CAPS Take daily by mouth.    . clobetasol (OLUX) 0.05 % topical foam APPLY TOPICALLY 2 TIMES DAILY. AVOID FACE, GROIN, AND AXILLA. 50 g 3  . clobetasol (OLUX) 0.05 % topical foam Apply topically 2 (two) times daily. 100 g 3  . Clobetasol Propionate Emulsion 0.05 % topical foam Apply 1 application topically 2 (two) times a week.     . halobetasol (ULTRAVATE) 0.05 % cream APPLY A SMALL AMOUNT TO THE AFFECTED AREA TWICE DAILY AS NEEDED FOR FLARES. AVOID THE FACE/GROIN/ARMPITS 50 g 0  . hydrocortisone  valerate cream (WESTCORT) 0.2 % Apply topically 2 (two) times daily. 15 g 3  . ibuprofen (ADVIL,MOTRIN) 200 MG tablet Take 400 mg by mouth as needed.    . Lactobacillus Rhamnosus, GG, (CULTURELLE PO) Take 1 tablet by mouth daily. Culturelle    . levothyroxine (SYNTHROID, LEVOTHROID) 50 MCG tablet Take 50 mcg 3 times a week (Mon, Wed, Fri),25 mcg 4 times a week (Tues, [place], Sat, Sun)    . lidocaine (LIDODERM) 5 % Place 1 patch onto the skin daily. Remove & Discard patch within 12 hours or as directed by MD 30 patch 0  . omeprazole (PRILOSEC) 20 MG capsule Take 1 capsule (20 mg total) by mouth daily. 30 capsule 0  . polyethylene glycol (MIRALAX / GLYCOLAX) packet You may use this as needed for more acute constipation.  Follow label instructions. 14 each 0  . Prenatal Vit-Fe Fumarate-FA (PRENATAL PO) Take 1 tablet by mouth daily.    Marland Kitchen triamcinolone cream (KENALOG) 0.1 % Apply 1 application topically 2 (two) times daily as needed (Rash). 15 g 2  . valACYclovir (VALTREX) 1000 MG tablet 2 tablets twice daily for 1 day for fever blisters 30 tablet 6  . Zinc 50 MG TABS Take by mouth.     No current facility-administered medications on file prior to visit.    Allergies  Allergen Reactions  . Codeine Nausea And Vomiting  . Hydrocodone-Acetaminophen     REACTION: reaction not known  . Penicillins Hives  Past Medical History:  Diagnosis Date  . Allergy   . Bradycardia   . Connective tissue disease ([place])   . DDD (degenerative disc disease), cervical   . DDD (degenerative disc disease), lumbar   . Endometrial polyp    not resolved with D&C (following)  . GERD (gastroesophageal reflux disease)    gas/belching issues-was on protonix for short time-not since 6 months  . HLD (hyperlipidemia)   . Mixed connective tissue disease ([place]) diagnosed 5 years ago   no problems since diagnosis  . Multiple thyroid nodules 01/24/2014  . Obesity   . Osteoarthritis of cervical spine   . PONV (postoperative  nausea and vomiting)   . Renal calculi   . Spinal stenosis     Past Surgical History:  Procedure Laterality Date  . APPENDECTOMY    . COLONOSCOPY    . DILATION AND CURETTAGE OF UTERUS     fibroids  . DILATION AND CURETTAGE OF UTERUS  01/2005   endometrial polyp  . ENDOMETRIAL BIOPSY  2008   fibroid  . HAND SURGERY     carpal tunnel release bilateral Dr. Daylene Katayama  . hysteroscopic resection      polyps of PMB  . TONSILLECTOMY    . TUBAL LIGATION     1973  . UPPER GASTROINTESTINAL ENDOSCOPY      Family History  Problem Relation Age of Onset  . Coronary artery disease Father   . Heart attack Father        deceased at 95  . Dementia Mother   . Diabetes Maternal Grandmother   . Goiter Maternal Grandmother   . Diabetes Cousin   . Cancer Grandchild        rare type (dying at age 80)  . Colon cancer Neg Hx   . Esophageal cancer Neg Hx   . Stomach cancer Neg Hx   . Rectal cancer Neg Hx     Social History   Socioeconomic History  . Marital status: Married    Spouse name: Not on file  . Number of children: 4  . Years of education: HS  . Highest education level: Not on file  Occupational History  . Occupation: Retired   Tobacco Use  . Smoking status: Former Smoker    Types: Cigarettes    Quit date: 07/22/1965    Years since quitting: 54.6  . Smokeless tobacco: Never Used  Vaping Use  . Vaping Use: Never used  Substance and Sexual Activity  . Alcohol use: No  . Drug use: No  . Sexual activity: Yes    Birth control/protection: Surgical    Comment: BTL  Other Topics Concern  . Not on file  Social History Narrative   + caffeine use     Social Determinants of Health   Financial Resource Strain:   . Difficulty of Paying Living Expenses:   Food Insecurity:   . Worried About Charity fundraiser in the Last Year:   . Arboriculturist in the Last Year:   Transportation Needs:   . Film/video editor (Medical):   Marland Kitchen Lack of Transportation (Non-Medical):   Physical  Activity:   . Days of Exercise per Week:   . Minutes of Exercise per Session:   Stress:   . Feeling of Stress :   Social Connections:   . Frequency of Communication with Friends and Family:   . Frequency of Social Gatherings with Friends and Family:   . Attends Religious Services:   .  Active Member of Clubs or Organizations:   . Attends Archivist Meetings:   Marland Kitchen Marital Status:   Intimate Partner Violence:   . Fear of Current or Ex-Partner:   . Emotionally Abused:   Marland Kitchen Physically Abused:   . Sexually Abused:    Review of Systems Chronic GI gas issues--better with probiotic No regular acid reflux symptoms Had GI appt---was just canceled Bowels are regular --but using stool softener. Not taking miralax now    Objective:   Physical Exam Constitutional:      Appearance: She is well-developed.  Cardiovascular:     Rate and Rhythm: Normal rate and regular rhythm.     Heart sounds: Normal heart sounds. No murmur heard.  No S3 sounds.   Pulmonary:     Effort: Pulmonary effort is normal.     Breath sounds: Normal breath sounds.  Chest:     Chest wall: No tenderness.  Abdominal:     Palpations: Abdomen is soft. There is no mass.     Tenderness: There is no abdominal tenderness.  Musculoskeletal:     Cervical back: Normal range of motion.  Lymphadenopathy:     Cervical: No cervical adenopathy.  Neurological:     Mental Status: She is alert.  Psychiatric:        Mood and Affect: Mood normal.        Behavior: Behavior normal.            Assessment & Plan:

## 2020-02-25 NOTE — Telephone Encounter (Signed)
[facility] - Client TELEPHONE ADVICE RECORD [facility] Patient Name: Morgan [name] Gender: Female DOB: [birth year] Age: 74 Y 60 M 18 D Return Phone Number: [phone] (Primary) Address: City/State/Zip: [place] Client [facility] - Client Client Site [facility], [name] - MD Contact Type Call Who Is Calling Patient / Member / Family / Caregiver Call Type Triage / Clinical Relationship To Patient Self Return Phone Number [phone] (Primary) Chief Complaint CHEST PAIN (>=21 years) - pain, pressure, heaviness or tightness Reason for Call Symptomatic / Request for Health Information Initial Comment Morgan [name] FXTKW[birth year] [phone] pt of tower, states she has tightness in her chest, saw tower on 7-21 started prilosec, rxed at er, episodes of this tightness, has called ems on a previous day when she had 7 episodes, and had an ekg and showed no signifcant findings Translation No Nurse Assessment Nurse: Earley Favor, RN, Suanne Marker Date/Time (Eastern Time): 02/24/2020 3:43:52 PM Confirm and document reason for call. If symptomatic, describe symptoms. ---Caller states she has been having episodes of pain under chin, into neck and chest. The episodes last about 3 min, and pain is unbearable. These episodes have been happening since 7/9. Was seen in the ER on 7/12 EKG/cardiac workup was neg, was given prilosec. Was seen by PCP on 7/21 and was advised to cont Prilosec. EMS came to see pt on 8/1 but wasn't transported. Had an appointment w/GI doctor tomorrow but doctor had to reschedule for Sept. Patient is very frustrated that this has been going on for past 4 weeks - no answers. No "attack" today, last night had a very bad episode. There is no relation to meals. Has the patient had close contact with a person known or suspected to have the novel coronavirus illness OR traveled / lives in  area with major community spread (including international travel) in the last 14 days from the onset of symptoms? * If Asymptomatic, screen for exposure and travel within the last 14 days. ---No Does the patient have any new or worsening symptoms? ---Yes Will a triage be completed? ---Yes Related visit to physician within the last 2 weeks? ---Yes Does the PT have any chronic conditions? (i.e. diabetes, asthma, this includes High risk factors for pregnancy, etc.) ---Yes List chronic conditions. ---hypothyroidism Is this a behavioral health or substance abuse call? ---No PLEASE NOTE: All timestamps contained within this report are represented as [place] Standard Time. CONFIDENTIALTY NOTICE: This fax transmission is intended only for the addressee. It contains information that is legally privileged, confidential or otherwise protected from use or disclosure. If you are not the intended recipient, you are strictly prohibited from reviewing, disclosing, copying using or disseminating any of this information or taking any action in reliance on or regarding this information. If you have received this fax in error, please notify us immediately by telephone so that we can arrange for its return to [place]. Phone: [phone], Toll-Free: [phone], Fax: [phone] Page: 2 of 2 Call Id: [id number] Nurse Assessment Guidelines Guideline Title Affirmed Question Affirmed Notes Nurse Date/Time Eilene Ghazi Time) Chest Pain [1] Chest pain lasting < 5 minutes AND [2] NO chest pain or cardiac symptoms (e.g., breathing difficulty, sweating) now (Exception: chest pains that last only a few seconds) Earley Favor, RN, Rhonda 02/24/2020 3:53:35 PM Disp. Time Eilene Ghazi Time) Disposition Final User 02/24/2020 3:42:16 PM Send to Urgent Cameron Sprang 02/24/2020 3:57:54 PM See PCP within 24 Hours Yes Earley Favor, RN, Rosalyn Charters Disagree/Comply  Comply Caller Understands Yes PreDisposition Did not know what to do Care  Advice Given Per Guideline SEE PCP WITHIN 24 HOURS: * IF OFFICE WILL BE OPEN: You need to be examined within the next 24 hours. Call your doctor (or NP/PA) when the office opens and make an appointment. CALL BACK IF: * Difficulty breathing occurs * Chest pain increases in frequency, duration or severity * Chest pain lasts over 5 minutes * You become worse. CARE ADVICE given per Chest Pain (Adult) guideline. Referrals REFERRED TO PCP OFFICE

## 2020-02-25 NOTE — Telephone Encounter (Signed)
Pt already has appt scheduled 02/25/20 at 11:15 with Dr Silvio Pate.

## 2020-02-25 NOTE — Assessment & Plan Note (Signed)
Attacks are most consistent with esophageal etiology Will increase omeprazole to bid Consider Rx for spasm--- if cardiac work up is negative Will have her stop all supplements/vitamins/probiotic for now

## 2020-02-28 ENCOUNTER — Telehealth: Payer: Self-pay | Admitting: Gastroenterology

## 2020-02-28 ENCOUNTER — Telehealth: Payer: Self-pay

## 2020-02-28 NOTE — Telephone Encounter (Signed)
Pt called requesting advise concerning GI and cardiology apts. She reports she has apt with both on 8/11 and cannot go to both and which she should go to. Advised pt she should go to cardiologist first due to not know if her symptoms are cardiac related. Per Dr. Alla German last office note.  Severe spells go back about 1 month ER evaluation negative and then EMS on 8/1---that EKG is unchanged and only shows non specific T wave flattening in anterolateral leads  History not really most consistent with coronary ischemia--but this needs to be considered  Pt was upset because she said GI told her it would be a month before they could get her in and she needs to get be seen so something can be figured out. She needs to know what is going on. She quotes, "I know there is something wrong with me." Advised pt to go to cardiology and contact GI and see if they could move apt to later or earlier time of the same day. She said she would call them and if they couldn't move it she would try at cardiology.  Advised if pt needed any help to contact this office. Pt verbalized understanding.

## 2020-02-28 NOTE — Telephone Encounter (Signed)
Patient called states she is having really bad chest pains and she had already seen PCP and had two EKG's and they advise to see GI. Patient is seeking advise

## 2020-02-28 NOTE — Telephone Encounter (Signed)
Aware, thank you.

## 2020-02-28 NOTE — Telephone Encounter (Signed)
Called back regarding her appointment. I rescheduled to 03-02-20 at 1:30pm w/Jessica Zehr.

## 2020-03-01 ENCOUNTER — Ambulatory Visit: Payer: PPO | Admitting: Gastroenterology

## 2020-03-01 ENCOUNTER — Ambulatory Visit: Payer: PPO | Admitting: Cardiology

## 2020-03-01 ENCOUNTER — Encounter: Payer: Self-pay | Admitting: Cardiology

## 2020-03-01 ENCOUNTER — Other Ambulatory Visit: Payer: Self-pay

## 2020-03-01 VITALS — BP 128/82 | HR 54 | Ht 65.0 in | Wt 176.8 lb

## 2020-03-01 DIAGNOSIS — R072 Precordial pain: Secondary | ICD-10-CM

## 2020-03-01 DIAGNOSIS — Z8249 Family history of ischemic heart disease and other diseases of the circulatory system: Secondary | ICD-10-CM

## 2020-03-01 DIAGNOSIS — M542 Cervicalgia: Secondary | ICD-10-CM | POA: Diagnosis not present

## 2020-03-01 DIAGNOSIS — Z01812 Encounter for preprocedural laboratory examination: Secondary | ICD-10-CM

## 2020-03-01 DIAGNOSIS — Z7189 Other specified counseling: Secondary | ICD-10-CM | POA: Diagnosis not present

## 2020-03-01 DIAGNOSIS — Z01818 Encounter for other preprocedural examination: Secondary | ICD-10-CM | POA: Diagnosis not present

## 2020-03-01 NOTE — Patient Instructions (Signed)
Medication Instructions:  Your Physician recommend you continue on your current medication as directed.    *If you need a refill on your cardiac medications before your next appointment, please call your pharmacy*   Lab Work: Your physician recommends that you return for lab work 1 week prior to procedure ( BMP).  If you have labs (blood work) drawn today and your tests are completely normal, you will receive your results only by: . MyChart Message (if you have MyChart) OR . A paper copy in the mail If you have any lab test that is abnormal or we need to change your treatment, we will call you to review the results.   Testing/Procedures: Cardiac CT Angiography (CTA), is a special type of CT scan that uses a computer to produce multi-dimensional views of major blood vessels throughout the body. In CT angiography, a contrast material is injected through an IV to help visualize the blood vessels [facility]   Follow-Up: At [facility], you and your health needs are our priority.  As part of our continuing mission to provide you with exceptional heart care, we have created designated Provider Care Teams.  These Care Teams include your primary Cardiologist (physician) and Advanced Practice Providers (APPs -  Physician Assistants and Nurse Practitioners) who all work together to provide you with the care you need, when you need it.  We recommend signing up for the patient portal called "MyChart".  Sign up information is provided on this After Visit Summary.  MyChart is used to connect with patients for Virtual Visits (Telemedicine).  Patients are able to view lab/test results, encounter notes, upcoming appointments, etc.  Non-urgent messages can be sent to your provider as well.   To learn more about what you can do with MyChart, go to [email or web address].    Your next appointment:   As needed  The format for your next appointment:   In Person  Provider:   Bridgette  Christopher, MD  Your cardiac CT will be scheduled at one of the below locations:   [facility] [address] [place], [place] [address] [phone]   If scheduled at [facility], please arrive at the [facility] main entrance [facility] 30 minutes prior to test start time. Proceed to the [place] [facility] Department (first floor) to check-in and test prep.  If scheduled at [facility], please arrive 15 mins early for check-in and test prep.  Please follow these instructions carefully (unless otherwise directed):  On the Night Before the Test: . Be sure to Drink plenty of water. . Do not consume any caffeinated/decaffeinated beverages or chocolate 12 hours prior to your test. . Do not take any antihistamines 12 hours prior to your test.  On the Day of the Test: . Drink plenty of water. Do not drink any water within one hour of the test. . Do not eat any food 4 hours prior to the test. . You may take your regular medications prior to the test.  . FEMALES- please wear underwire-free bra if available       After the Test: . Drink plenty of water. . After receiving IV contrast, you may experience a mild flushed feeling. This is normal. . On occasion, you may experience a mild rash up to 24 hours after the test. This is not dangerous. If this occurs, you can take Benadryl 25 mg and increase your fluid intake. . If you experience trouble breathing, this   can be serious. If it is severe call 911 IMMEDIATELY. If it is mild, please call our office. . If you take any of these medications: Glipizide/Metformin, Avandament, Glucavance, please do not take 48 hours after completing test unless otherwise instructed.   Once we have confirmed authorization from your insurance company, we will call you to set up a date and time for your test. Based on how quickly your insurance processes prior authorizations requests, please allow up  to 4 weeks to be contacted for scheduling your Cardiac CT appointment. Be advised that routine Cardiac CT appointments could be scheduled as many as 8 weeks after your provider has ordered it.  For non-scheduling related questions, please contact the cardiac imaging nurse navigator should you have any questions/concerns: Sara Wallace, Cardiac Imaging Nurse Navigator Merle Tai, Interim Cardiac Imaging Nurse Navigator [facility] Direct Office Dial: [phone]   For scheduling needs, including cancellations and rescheduling, please call Toni at [phone], option 3.    

## 2020-03-01 NOTE — Progress Notes (Signed)
Cardiology Office Note:    Date:  03/01/2020   ID:  Morgan [name], DOB 17-Jan-1946, MRN [id number]  PCP:  Abner Greenspan, MD  Cardiologist:  Buford Dresser, MD  Referring MD: Abner Greenspan, MD   CC: new patient consultation for chest and neck pain  History of Present Illness:    Morgan BRANDENBURGER is a 74 y.o. female with a hx of hypothyroidism, hyperlipidemia who is seen as a new consult at the request of Tower, Wynelle Fanny, MD for the evaluation and management of chest pain. She was remotely seen by Dr. Harrington Challenger but has not been seen since 2009.  Notes reviewed. Seen by Dr. Silvio Pate 02/25/20, per notes her chest discomfort attacks were thought to be due to esophageal etiology and her PPI was increased. Recommended to rule out cardiac etiology.  Patient history: Started having symptoms 01/28/20. Had never had prior. Felt like a tightness/terrible pain in her neck and throat. Couldn't speak for about 3 minutes. Happening up to 3 times/day. No clear aggravating/alleviating factors. Ten days later, started to begin in the upper chest and then move up her neck to her ears. A week after that, started in low chest and worked its way up. Most common first thing in the AM, usually before she ate or drank anything.   One day she had 7 events, went to ER. This was 02/07/20. hsTnI 5 (nl). ECG unremarkable. Started on prilosec. Notes that on this day, she had fresh corn and fresh tomatoes before her severe symptoms.   On 8/7 at 5 pm, had very severe chest pain that then became more mild. Has gotten slightly better with increasing prilosec to twice a day. Not exertional, no associated symptoms. Worse with drinking coffee, has completely cut this out of her diet because of the severe burning. Belches frequently, doesn't feel relief after she belches. Never occurs after she goes to bed at night. Most in the morning before she eats. No shortness of breath with these, though occasionally dizzy or sweaty with severe  episodes. By the end of the day, she feels hoarse, and her throat hurts.  Has been told for 20 years she has issues with gas, but has not received treatment for it in the past.  -Prior cardiac history: none -Prior ECG: sinus -Prior workup: no results available to me. Reports prior treadmill stress test was normal about 20 years ago. -Prior treatment: started PPI with some improvement -Alcohol: none -Tobacco: former, remote (quit age 76 after 2 years). Husband was a former smoker, had some secondhand exposure from him. -Exercise level: stays active, no limitations.  -Cardiac ROS: no shortness of breath, no PND, no orthopnea, no LE edema, no syncope -Family history: father died of massive MI at age 32.   Past Medical History:  Diagnosis Date  . Allergy   . Bradycardia   . Connective tissue disease ([place])   . DDD (degenerative disc disease), cervical   . DDD (degenerative disc disease), lumbar   . Endometrial polyp    not resolved with D&C (following)  . GERD (gastroesophageal reflux disease)    gas/belching issues-was on protonix for short time-not since 6 months  . HLD (hyperlipidemia)   . Mixed connective tissue disease ([place]) diagnosed 5 years ago   no problems since diagnosis  . Multiple thyroid nodules 01/24/2014  . Obesity   . Osteoarthritis of cervical spine   . PONV (postoperative nausea and vomiting)   . Renal calculi   . Spinal stenosis  Past Surgical History:  Procedure Laterality Date  . APPENDECTOMY    . COLONOSCOPY    . DILATION AND CURETTAGE OF UTERUS     fibroids  . DILATION AND CURETTAGE OF UTERUS  01/2005   endometrial polyp  . ENDOMETRIAL BIOPSY  2008   fibroid  . HAND SURGERY     carpal tunnel release bilateral Dr. Daylene Katayama  . hysteroscopic resection      polyps of PMB  . TONSILLECTOMY    . TUBAL LIGATION     1973  . UPPER GASTROINTESTINAL ENDOSCOPY      Current Medications: Current Outpatient Medications on File Prior to Visit  Medication Sig    . levothyroxine (SYNTHROID, LEVOTHROID) 50 MCG tablet Take 50 mcg 3 times a week (Mon, Wed, Fri),25 mcg 4 times a week (Tues, [place], Sat, Sun)  . omeprazole (PRILOSEC) 20 MG capsule Take 1 capsule (20 mg total) by mouth daily.  Marland Kitchen OVER THE COUNTER MEDICATION STOOL SOFTNER   No current facility-administered medications on file prior to visit.     Allergies:   Codeine, Hydrocodone-acetaminophen, and Penicillins   Social History   Tobacco Use  . Smoking status: Former Smoker    Types: Cigarettes    Quit date: 07/22/1965    Years since quitting: 54.6  . Smokeless tobacco: Never Used  Vaping Use  . Vaping Use: Never used  Substance Use Topics  . Alcohol use: No  . Drug use: No    Family History: family history includes Cancer in her grandchild; Coronary artery disease in her father; Dementia in her mother; Diabetes in her cousin and maternal grandmother; Goiter in her maternal grandmother; Heart attack in her father. There is no history of Colon cancer, Esophageal cancer, Stomach cancer, or Rectal cancer.  ROS:   Please see the history of present illness.  Additional pertinent ROS: Constitutional: Negative for chills, fever, night sweats, unintentional weight loss  HENT: Negative for ear pain and hearing loss.   Eyes: Negative for loss of vision and eye pain.  Respiratory: Negative for cough, sputum, wheezing.   Cardiovascular: See HPI. Gastrointestinal: Negative for melena and hematochezia.  Genitourinary: Negative for dysuria and hematuria.  Musculoskeletal: Negative for falls and myalgias.  Skin: Negative for itching and rash.  Neurological: Negative for focal weakness, focal sensory changes and loss of consciousness.  Endo/Heme/Allergies: Does not bruise/bleed easily.     EKGs/Labs/Other Studies Reviewed:    The following studies were reviewed today: No prior cardiac studies  EKG:  EKG is personally reviewed.  The ekg ordered today demonstrates sinus bradycardia at 54  bpm.  Recent Labs: 02/07/2020: BUN 17; Creatinine, Ser 0.70; Hemoglobin 13.3; Platelets 203; Potassium 4.0; Sodium 144  Recent Lipid Panel    Component Value Date/Time   CHOL 202 (H) 02/02/2019 1039   TRIG 86.0 02/02/2019 1039   HDL 50.30 02/02/2019 1039   CHOLHDL 4 02/02/2019 1039   VLDL 17.2 02/02/2019 1039   LDLCALC 135 (H) 02/02/2019 1039   LDLDIRECT 148.8 10/09/2010 1148    Physical Exam:    VS:  BP 128/82 (BP Location: Left Arm, Patient Position: Sitting, Cuff Size: Normal)   Pulse (!) 54   Ht _0  (1.651 m)   Wt 176 lb 12.8 oz (80.2 kg)   LMP 07/22/1989 (Approximate)   BMI 29.42 kg/m     Wt Readings from Last 3 Encounters:  03/01/20 176 lb 12.8 oz (80.2 kg)  02/25/20 175 lb (79.4 kg)  02/09/20 178 lb 9 oz (81  kg)    GEN: Well nourished, well developed in no acute distress HEENT: Normal, moist mucous membranes NECK: No JVD CARDIAC: regular rhythm, normal S1 and S2, no rubs or gallops. No murmurs. VASCULAR: Radial and DP pulses 2+ bilaterally. No carotid bruits RESPIRATORY:  Clear to auscultation without rales, wheezing or rhonchi  ABDOMEN: Soft, non-tender, non-distended MUSCULOSKELETAL:  Ambulates independently SKIN: Warm and dry, no edema NEUROLOGIC:  Alert and oriented x 3. No focal neuro deficits noted. PSYCHIATRIC:  Normal affect    ASSESSMENT:    1. Precordial pain   2. Pre-procedure lab exam   3. Neck pain   4. Family history of heart disease   5. Cardiac risk counseling   6. Counseling on health promotion and disease prevention    PLAN:    Neck/chest pain: -not typical symptoms for cardiac etiology. However, suspect she may need endoscopy. Though lower suspicion for cardiac cause, will rule out ischemia as well. From my standpoint, she can have the endoscopy performed at low risk prior to the completion of coronary CT if that is how the timing works out. -very reassuring that hsTnI normal after several days of active symptoms. We discussed this  test at length and how it measures damage to the heart. Essentially excludes MI as acute cause of symptoms. -doesn't think she can walk on the treadmill long enough due to leg and back issues. -discussed treadmill stress, nuclear stress/lexiscan, and CT coronary angiography. Discussed pros and cons of each, including but not limited to false positive/false negative risk, radiation risk, and risk of IV contrast dye. Based on shared decision making, decision was made to pursue CT coronary angiography. -no metoprolol given her baseline sinus bradycardia -counseled on need to get BMET prior to test -counseled on use of sublingual nitroglycerin and its importance to a good test  Cardiac risk counseling and prevention recommendations: has family history of heart disease, strongest in her father -recommend heart healthy/Mediterranean diet, with whole grains, fruits, vegetable, fish, lean meats, nuts, and olive oil. Limit salt. -recommend moderate walking, 3-5 times/week for 30-50 minutes each session. Aim for at least 150 minutes.week. Goal should be pace of 3 miles/hours, or walking 1.5 miles in 30 minutes -recommend avoidance of tobacco products. Avoid excess alcohol. -ASCVD risk score: The 10-year ASCVD risk score Mikey Bussing DC Brooke Bonito., et al., 2013) is: 23.8%   Values used to calculate the score:     Age: 18 years     Sex: Female     Is Non-Hispanic African American: No     Diabetic: Yes     Tobacco smoker: No     Systolic Blood Pressure: 010 mmHg     Is BP treated: No     HDL Cholesterol: 50.3 mg/dL     Total Cholesterol: 202 mg/dL    Plan for follow up: TBD based on results of testing. If CT unremarkable, can follow up as needed. If there is need to discuss CAD secondary prevention due to CT results, we will bring her back to review this.  Buford Dresser, MD, PhD [place]  [facility]    Medication Adjustments/Labs and Tests Ordered: Current medicines are reviewed at length with the  patient today.  Concerns regarding medicines are outlined above.  Orders Placed This Encounter  Procedures  . CT CORONARY MORPH W/CTA COR W/SCORE W/CA W/CM &/OR WO/CM  . CT CORONARY FRACTIONAL FLOW RESERVE DATA PREP  . CT CORONARY FRACTIONAL FLOW RESERVE FLUID ANALYSIS  . Basic metabolic panel  .  EKG 12-Lead   No orders of the defined types were placed in this encounter.   Patient Instructions  Medication Instructions:  Your Physician recommend you continue on your current medication as directed.    *If you need a refill on your cardiac medications before your next appointment, please call your pharmacy*   Lab Work: Your physician recommends that you return for lab work 1 week prior to procedure ( BMP).   If you have labs (blood work) drawn today and your tests are completely normal, you will receive your results only by: Marland Kitchen MyChart Message (if you have MyChart) OR . A paper copy in the mail If you have any lab test that is abnormal or we need to change your treatment, we will call you to review the results.   Testing/Procedures: Cardiac CT Angiography (CTA), is a special type of CT scan that uses a computer to produce multi-dimensional views of major blood vessels throughout the body. In CT angiography, a contrast material is injected through an IV to help visualize the blood vessels [facility]  Follow-Up: At [facility], you and your health needs are our priority.  As part of our continuing mission to provide you with exceptional heart care, we have created designated Provider Care Teams.  These Care Teams include your primary Cardiologist (physician) and Advanced Practice Providers (APPs -  Physician Assistants and Nurse Practitioners) who all work together to provide you with the care you need, when you need it.  We recommend signing up for the patient portal called "MyChart".  Sign up information is provided on this After Visit Summary.  MyChart is used to connect  with patients for Virtual Visits (Telemedicine).  Patients are able to view lab/test results, encounter notes, upcoming appointments, etc.  Non-urgent messages can be sent to your provider as well.   To learn more about what you can do with MyChart, go to [email or web address].    Your next appointment:   As needed  The format for your next appointment:   In Person  Provider:   Buford Dresser, MD  Your cardiac CT will be scheduled at one of the below locations:   [facility] [address] [place],  [address] [phone]   If scheduled at [facility], please arrive at the [facility] main entrance of [name] 30 minutes prior to test start time. Proceed to the [facility] Radiology Department (first floor) to check-in and test prep.  If scheduled at [facility], please arrive 15 mins early for check-in and test prep.  Please follow these instructions carefully (unless otherwise directed):   On the Night Before the Test: . Be sure to Drink plenty of water. . Do not consume any caffeinated/decaffeinated beverages or chocolate 12 hours prior to your test. . Do not take any antihistamines 12 hours prior to your test.   On the Day of the Test: . Drink plenty of water. Do not drink any water within one hour of the test. . Do not eat any food 4 hours prior to the test. . You may take your regular medications prior to the test.  . FEMALES- please wear underwire-free bra if available        After the Test: . Drink plenty of water. . After receiving IV contrast, you may experience a mild flushed feeling. This is normal. . On occasion, you may experience a mild rash up to 24 hours after the test. This is  not dangerous. If this occurs, you can take Benadryl 25 mg and increase your fluid intake. . If you experience trouble breathing, this can be serious. If it is severe call 911 IMMEDIATELY. If it is mild,  please call our office. . If you take any of these medications: Glipizide/Metformin, Avandament, Glucavance, please do not take 48 hours after completing test unless otherwise instructed.   Once we have confirmed authorization from your insurance company, we will call you to set up a date and time for your test. Based on how quickly your insurance processes prior authorizations requests, please allow up to 4 weeks to be contacted for scheduling your Cardiac CT appointment. Be advised that routine Cardiac CT appointments could be scheduled as many as 8 weeks after your provider has ordered it.  For non-scheduling related questions, please contact the cardiac imaging nurse navigator should you have any questions/concerns: Marchia Bond, Cardiac Imaging Nurse Navigator Burley Saver, Interim Cardiac Imaging Nurse [place] [facility] Direct Office Dial: [phone]   For scheduling needs, including cancellations and rescheduling, please call Vivien Rota at [phone], option 3.      Signed, Buford Dresser, MD PhD 03/01/2020 1:11 PM    [place]

## 2020-03-02 ENCOUNTER — Encounter: Payer: Self-pay | Admitting: Gastroenterology

## 2020-03-02 ENCOUNTER — Ambulatory Visit: Payer: PPO | Admitting: Gastroenterology

## 2020-03-02 VITALS — BP 120/78 | HR 52 | Ht 65.0 in | Wt 175.5 lb

## 2020-03-02 DIAGNOSIS — K219 Gastro-esophageal reflux disease without esophagitis: Secondary | ICD-10-CM | POA: Diagnosis not present

## 2020-03-02 DIAGNOSIS — R0789 Other chest pain: Secondary | ICD-10-CM

## 2020-03-02 MED ORDER — OMEPRAZOLE 40 MG PO CPDR
40.0000 mg | DELAYED_RELEASE_CAPSULE | Freq: Two times a day (BID) | ORAL | 5 refills | Status: DC
Start: 2020-03-02 — End: 2020-04-04

## 2020-03-02 NOTE — Patient Instructions (Addendum)
If you are age 73 or older, your body mass index should be between 23-30. Your Body mass index is 29.2 kg/m. If this is out of the aforementioned range listed, please consider follow up with your Primary Care Provider.  If you are age 52 or younger, your body mass index should be between 19-25. Your Body mass index is 29.2 kg/m. If this is out of the aformentioned range listed, please consider follow up with your Primary Care Provider.   We have sent the following medications to your pharmacy for you to pick up at your convenience: Omeprazole 40 mg twice daily.   You have been scheduled for an endoscopy. Please follow written instructions given to you at your visit today. If you use inhalers (even only as needed), please bring them with you on the day of your procedure.  Due to recent changes in healthcare laws, you may see the results of your imaging and laboratory studies on MyChart before your provider has had a chance to review them.  We understand that in some cases there may be results that are confusing or concerning to you. Not all laboratory results come back in the same time frame and the provider may be waiting for multiple results in order to interpret others.  Please give [place] 48 hours in order for your provider to thoroughly review all the results before contacting the office for clarification of your results.

## 2020-03-02 NOTE — Progress Notes (Signed)
03/02/2020 Morgan [name] [id number] 10-06-1945   HISTORY OF PRESENT ILLNESS: This is a pleasant 74 year old female who is a patient of Dr. Woodward Ku, known to her really only for colonoscopy last year.  She is here today with complaints of atypical chest pain.  She says that this distinctly started on July 9.  She says that she initially got this tight horrible pain in her throat that caused her to hardly be able to speak.  Then it went down onto her chest and then all the way through her chest and up into her ears.  These symptoms come in episodes and usually only last about 3 minutes at a time.  It has been up to 7 times a day that this has occurred.  She describes it as excruciating pain.  She had gone to the emergency department an EKG and labs were unremarkable.  They placed her on omeprazole 20 mg daily without much relief.  About 6 days ago her PCP increased the omeprazole to 20 mg twice daily.  She says that since then the episodes seem to be less severe/intense.  She says that she has never had any reflux type symptoms in the past.  She has had longstanding issues with a lot of belching and upper abdominal gas, but no reflux and certainly nothing similar to this.  She says that on very rare occasion she would have an episode while eating that felt like her esophagus just twisted like a rope and will not allow the food to go down.  It then will relax and things will be fine.  This happens only on rare occasion.   Past Medical History:  Diagnosis Date  . Allergy   . Bradycardia   . Connective tissue disease ([place])   . DDD (degenerative disc disease), cervical   . DDD (degenerative disc disease), lumbar   . Endometrial polyp    not resolved with D&C (following)  . GERD (gastroesophageal reflux disease)    gas/belching issues-was on protonix for short time-not since 6 months  . HLD (hyperlipidemia)   . Mixed connective tissue disease ([place]) diagnosed 5 years ago   no problems since  diagnosis  . Multiple thyroid nodules 01/24/2014  . Obesity   . Osteoarthritis of cervical spine   . PONV (postoperative nausea and vomiting)   . Renal calculi   . Spinal stenosis    Past Surgical History:  Procedure Laterality Date  . APPENDECTOMY    . COLONOSCOPY    . DILATION AND CURETTAGE OF UTERUS     fibroids  . DILATION AND CURETTAGE OF UTERUS  01/2005   endometrial polyp  . ENDOMETRIAL BIOPSY  2008   fibroid  . HAND SURGERY     carpal tunnel release bilateral Dr. Daylene Katayama  . hysteroscopic resection      polyps of PMB  . TONSILLECTOMY    . TUBAL LIGATION     1973  . UPPER GASTROINTESTINAL ENDOSCOPY      reports that she quit smoking about 54 years ago. Her smoking use included cigarettes. She has never used smokeless tobacco. She reports that she does not drink alcohol and does not use drugs. family history includes Cancer in her grandchild; Coronary artery disease in her father; Dementia in her mother; Diabetes in her cousin and maternal grandmother; Goiter in her maternal grandmother; Heart attack in her father. Allergies  Allergen Reactions  . Codeine Nausea And Vomiting  . Hydrocodone-Acetaminophen     REACTION: reaction  not known  . Penicillins Hives      Outpatient Encounter Medications as of 03/02/2020  Medication Sig  . levothyroxine (SYNTHROID, LEVOTHROID) 50 MCG tablet Take 50 mcg 3 times a week (Mon, Wed, Fri),25 mcg 4 times a week (Tues, [place], Sat, Sun)  . omeprazole (PRILOSEC) 20 MG capsule Take 1 capsule (20 mg total) by mouth daily. (Patient taking differently: Take 20 mg by mouth daily. 2 times daily)  . OVER THE COUNTER MEDICATION STOOL SOFTNER   No facility-administered encounter medications on file as of 03/02/2020.    REVIEW OF SYSTEMS  : All other systems reviewed and negative except where noted in the History of Present Illness.   PHYSICAL EXAM: BP 120/78   Pulse (!) 52   Ht 5\' 5"  (1.651 m)   Wt 175 lb 8 oz (79.6 kg)   LMP 07/22/1989  (Approximate)   BMI 29.20 kg/m  General: Well developed white female in no acute distress Head: Normocephalic and atraumatic Eyes:  Sclerae anicteric, conjunctiva pink. Ears: Normal auditory acuity Lungs: Clear throughout to auscultation; no increased WOB. Heart: Regular rate and rhythm; no M/R/G. Abdomen: Soft, non-distended.  BS present.  Non-tender. Musculoskeletal: Symmetrical with no gross deformities  Skin: No lesions on visible extremities Extremities: No edema  Neurological: Alert oriented x 4, grossly non-focal Psychological:  Alert and cooperative. Normal mood and affect  ASSESSMENT AND PLAN: *Atypical chest pain: Cardiology does not think that this is necessarily cardiac related and are clearing her to have EGD prior to her cardiac CT that has been ordered.  It actually sounds like this could be esophageal spasms.  It has improved somewhat with the slight increase in her acid reflux regimen.  I am going to increase her omeprazole further to 40 mg twice daily.  New prescription was sent to the pharmacy.  We will schedule her for EGD with Dr. Silverio Decamp.  Pending results of EGD and her response to medication may need esophageal manometry.  The risks, benefits, and alternatives to EGD were discussed with the patient and she consents to proceed.   CC:  Tower, Wynelle Fanny, MD

## 2020-03-06 NOTE — Progress Notes (Signed)
Reviewed and agree with documentation and assessment and plan. K. Veena Bear Osten , MD   

## 2020-03-07 ENCOUNTER — Encounter: Payer: Self-pay | Admitting: Gastroenterology

## 2020-03-13 DIAGNOSIS — Z01812 Encounter for preprocedural laboratory examination: Secondary | ICD-10-CM | POA: Diagnosis not present

## 2020-03-14 ENCOUNTER — Telehealth (HOSPITAL_COMMUNITY): Payer: Self-pay | Admitting: Emergency Medicine

## 2020-03-14 LAB — BASIC METABOLIC PANEL
BUN/Creatinine Ratio: 22 (ref 12–28)
BUN: 16 mg/dL (ref 8–27)
CO2: 29 mmol/L (ref 20–29)
Calcium: 9.9 mg/dL (ref 8.7–10.3)
Chloride: 103 mmol/L (ref 96–106)
Creatinine, Ser: 0.72 mg/dL (ref 0.57–1.00)
GFR calc Af Amer: 96 mL/min/{1.73_m2} (ref >59)
GFR calc non Af Amer: 83 mL/min/{1.73_m2} (ref >59)
Glucose: 67 mg/dL (ref 65–99)
Potassium: 4.7 mmol/L (ref 3.5–5.2)
Sodium: 141 mmol/L (ref 134–144)

## 2020-03-14 NOTE — Telephone Encounter (Signed)
Attempted to call patient regarding upcoming cardiac CT appointment. °Left message on voicemail with name and callback number °Tyerra Loretto RN Navigator Cardiac Imaging °[facility] °[phone] Office °[phone] Cell ° °

## 2020-03-14 NOTE — Telephone Encounter (Signed)
Pt returning phone call regarding upcoming cardiac imaging study; pt verbalizes understanding of appt date/time, parking situation and where to check in, pre-test NPO status and medications ordered, and verified current allergies; name and call back number provided for further questions should they arise Emalynn Clewis RN Navigator Cardiac Imaging [facility] [phone] office [phone] cell   

## 2020-03-15 ENCOUNTER — Other Ambulatory Visit: Payer: Self-pay | Admitting: Gastroenterology

## 2020-03-15 ENCOUNTER — Ambulatory Visit (INDEPENDENT_AMBULATORY_CARE_PROVIDER_SITE_OTHER): Payer: PPO

## 2020-03-15 DIAGNOSIS — Z1159 Encounter for screening for other viral diseases: Secondary | ICD-10-CM

## 2020-03-16 ENCOUNTER — Other Ambulatory Visit: Payer: Self-pay

## 2020-03-16 ENCOUNTER — Ambulatory Visit
Admission: RE | Admit: 2020-03-16 | Discharge: 2020-03-16 | Disposition: A | Discharge status: Home / Self Care | Payer: PPO | Source: Ambulatory Visit | Attending: Cardiology | Admitting: Cardiology

## 2020-03-16 DIAGNOSIS — R072 Precordial pain: Secondary | ICD-10-CM | POA: Insufficient documentation

## 2020-03-16 HISTORY — DX: Systemic involvement of connective tissue, unspecified: M35.9

## 2020-03-16 LAB — SARS CORONAVIRUS 2 (TAT 6-24 HRS): SARS Coronavirus 2: NEGATIVE

## 2020-03-16 MED ORDER — IOHEXOL 350 MG/ML SOLN
75.0000 mL | Freq: Once | INTRAVENOUS | Status: AC | PRN
  Administered 2020-03-16: 75 mL via INTRAVENOUS

## 2020-03-16 MED ORDER — NITROGLYCERIN 0.4 MG SL SUBL
0.8000 mg | SUBLINGUAL_TABLET | Freq: Once | SUBLINGUAL | Status: AC
  Administered 2020-03-16: 0.8 mg via SUBLINGUAL

## 2020-03-16 NOTE — Progress Notes (Signed)
Patient tolerated CT well. Gave water to patient to drink. Ambulatory steady gait to exit.

## 2020-03-17 ENCOUNTER — Encounter: Payer: Self-pay | Admitting: Gastroenterology

## 2020-03-17 ENCOUNTER — Ambulatory Visit (AMBULATORY_SURGERY_CENTER): Payer: PPO | Admitting: Gastroenterology

## 2020-03-17 VITALS — BP 134/57 | HR 45 | Temp 96.9°F | Resp 14 | Ht 65.0 in | Wt 175.0 lb

## 2020-03-17 DIAGNOSIS — K2100 Gastro-esophageal reflux disease with esophagitis, without bleeding: Secondary | ICD-10-CM | POA: Diagnosis not present

## 2020-03-17 DIAGNOSIS — K219 Gastro-esophageal reflux disease without esophagitis: Secondary | ICD-10-CM | POA: Diagnosis not present

## 2020-03-17 MED ORDER — SODIUM CHLORIDE 0.9 % IV SOLN: 500.0000 mL | Freq: Once | INTRAVENOUS | Status: DC

## 2020-03-17 MED ORDER — DEXILANT 60 MG PO CPDR: 60.0000 mg | DELAYED_RELEASE_CAPSULE | Freq: Every day | ORAL | 6 refills | Status: DC

## 2020-03-17 NOTE — Progress Notes (Signed)
pt tolerated well. VSS. awake and to recovery. Report given to RN. Bite block placed while awake no compalint no trauma. No trauma when gently removed.

## 2020-03-17 NOTE — Op Note (Addendum)
[name] Patient Name: Morgan [name] Procedure Date: 03/17/2020 1:29 PM MRN: [id number] Endoscopist: Mauri Pole , MD Age: 74 Referring MD:  Date of Birth: 04/06/1946 Gender: Female Account #: [id number] Procedure:                Upper GI endoscopy Indications:              Odynophagia, Heartburn, Unexplained chest pain,                            Chest pain (non cardiac) Medicines:                Monitored Anesthesia Care Procedure:                Pre-Anesthesia Assessment:                           - Prior to the procedure, a History and Physical                            was performed, and patient medications and                            allergies were reviewed. The patient's tolerance of                            previous anesthesia was also reviewed. The risks                            and benefits of the procedure and the sedation                            options and risks were discussed with the patient.                            All questions were answered, and informed consent                            was obtained. Prior Anticoagulants: The patient has                            taken no previous anticoagulant or antiplatelet                            agents. ASA Grade Assessment: II - A patient with                            mild systemic disease. After reviewing the risks                            and benefits, the patient was deemed in                            satisfactory condition to undergo the procedure.  After obtaining informed consent, the endoscope was                            passed under direct vision. Throughout the                            procedure, the patient's blood pressure, pulse, and                            oxygen saturations were monitored continuously. The                            Endoscope was introduced through the mouth, and                            advanced to the second part  of duodenum. The upper                            GI endoscopy was accomplished without difficulty.                            The patient tolerated the procedure well. Scope In: Scope Out: Findings:                 LA Grade B (one or more mucosal breaks greater than                            5 mm, not extending between the tops of two mucosal                            folds) esophagitis with no bleeding was found 34 to                            35 cm from the incisors.                           The exam of the esophagus was otherwise normal.                           The gastroesophageal flap valve was visualized                            endoscopically and classified as Hill Grade III                            (minimal fold, loose to endoscope, hiatal hernia                            likely).                           The stomach was normal.                           The examined  duodenum was normal. Complications:            No immediate complications. Estimated Blood Loss:     Estimated blood loss was minimal. Impression:               - LA Grade B reflux esophagitis with no bleeding.                           - Gastroesophageal flap valve classified as Hill                            Grade III (minimal fold, loose to endoscope, hiatal                            hernia likely).                           - Normal stomach.                           - Normal examined duodenum.                           - No specimens collected. Recommendation:           - Patient has a contact number available for                            emergencies. The signs and symptoms of potential                            delayed complications were discussed with the                            patient. Return to normal activities tomorrow.                            Written discharge instructions were provided to the                            patient.                           - Resume previous  diet.                           - Continue present medications.                           - Follow an antireflux regimen.                           - Dexilant 60mg  daily                           - Return to GI office in 3-4 months, please call  [phone] to schedule office visit. Mauri Pole, MD 03/17/2020 1:43:56 PM This report has been signed electronically.

## 2020-03-17 NOTE — Progress Notes (Signed)
Vitals by JK

## 2020-03-17 NOTE — Patient Instructions (Signed)
handout given:  GERD Resume previous diet  continue present medications Follow an antireflux regimen Start dexilant 60mg  once daily. Take with a full glass of water 30 minutes before you eat a meal Return to GI office in 3-4 months :  Please call [phone]  YOU HAD AN ENDOSCOPIC PROCEDURE TODAY AT [facility]:   Refer to the procedure report that was given to you for any specific questions about what was found during the examination.  If the procedure report does not answer your questions, please call your gastroenterologist to clarify.  If you requested that your care partner not be given the details of your procedure findings, then the procedure report has been included in a sealed envelope for you to review at your convenience later.  YOU SHOULD EXPECT: Some feelings of bloating in the abdomen. Passage of more gas than usual.  Walking can help get rid of the air that was put into your GI tract during the procedure and reduce the bloating. If you had a lower endoscopy (such as a colonoscopy or flexible sigmoidoscopy) you may notice spotting of blood in your stool or on the toilet paper. If you underwent a bowel prep for your procedure, you may not have a normal bowel movement for a few days.  Please Note:  You might notice some irritation and congestion in your nose or some drainage.  This is from the oxygen used during your procedure.  There is no need for concern and it should clear up in a day or so.  SYMPTOMS TO REPORT IMMEDIATELY:   Following upper endoscopy (EGD)  Vomiting of blood or coffee ground material  New chest pain or pain under the shoulder blades  Painful or persistently difficult swallowing  New shortness of breath  Fever of 100F or higher  Black, tarry-looking stools  For urgent or emergent issues, a gastroenterologist can be reached at any hour by calling [phone]. Do not use MyChart messaging for urgent concerns.    DIET:  We do recommend a  small meal at first, but then you may proceed to your regular diet.  Drink plenty of fluids but you should avoid alcoholic beverages for 24 hours.  ACTIVITY:  You should plan to take it easy for the rest of today and you should NOT DRIVE or use heavy machinery until tomorrow (because of the sedation medicines used during the test).    FOLLOW UP: Our staff will call the number listed on your records 48-72 hours following your procedure to check on you and address any questions or concerns that you may have regarding the information given to you following your procedure. If we do not reach you, we will leave a message.  We will attempt to reach you two times.  During this call, we will ask if you have developed any symptoms of COVID 19. If you develop any symptoms (ie: fever, flu-like symptoms, shortness of breath, cough etc.) before then, please call [phone].  If you test positive for Covid 19 in the 2 weeks post procedure, please call and report this information to [place].    If any biopsies were taken you will be contacted by phone or by letter within the next 1-3 weeks.  Please call us at [phone] if you have not heard about the biopsies in 3 weeks.    SIGNATURES/CONFIDENTIALITY: You and/or your care partner have signed paperwork which will be entered into your electronic medical record.  These signatures attest to the  fact that that the information above on your After Visit Summary has been reviewed and is understood.  Full responsibility of the confidentiality of this discharge information lies with you and/or your care-partner.

## 2020-03-20 ENCOUNTER — Telehealth: Payer: Self-pay | Admitting: Cardiology

## 2020-03-20 NOTE — Telephone Encounter (Signed)
The patient has been notified of the result and verbalized understanding.  All questions (if any) were answered. Wilma Flavin, RN 03/20/2020 3:40 PM

## 2020-03-20 NOTE — Telephone Encounter (Signed)
Patient is requesting to discuss results from CT completed on 03/16/20. Please call.

## 2020-03-21 ENCOUNTER — Telehealth: Payer: Self-pay | Admitting: *Deleted

## 2020-03-21 ENCOUNTER — Other Ambulatory Visit: Payer: Self-pay | Admitting: Family

## 2020-03-21 ENCOUNTER — Telehealth: Payer: Self-pay | Admitting: Family

## 2020-03-21 DIAGNOSIS — Z6829 Body mass index (BMI) 29.0-29.9, adult: Secondary | ICD-10-CM

## 2020-03-21 DIAGNOSIS — E7800 Pure hypercholesterolemia, unspecified: Secondary | ICD-10-CM

## 2020-03-21 DIAGNOSIS — U071 COVID-19: Secondary | ICD-10-CM

## 2020-03-21 NOTE — Telephone Encounter (Signed)
Ok, please advise her to keep [place] informed how she is doing and covid test results. Thanks

## 2020-03-21 NOTE — Telephone Encounter (Signed)
Based on her age, she would qualify for the monoclonal antibody infusion (which I think is a good idea) to prevent more severe symptoms  If agreeable I can sent her info to the infusion team  Let me know if she wants to do this  Thanks

## 2020-03-21 NOTE — Progress Notes (Signed)
I connected by phone with Morgan [name] on 03/21/2020 at 3:27 PM to discuss the potential use of a new treatment for mild to moderate COVID-19 viral infection in non-hospitalized patients.  This patient is a 74 y.o. female that meets the FDA criteria for Emergency Use Authorization of COVID monoclonal antibody casirivimab/imdevimab.  Has a (+) direct SARS-CoV-2 viral test result  Has mild or moderate COVID-19   Is NOT hospitalized due to COVID-19  Is within 10 days of symptom onset  Has at least one of the high risk factor(s) for progression to severe COVID-19 and/or hospitalization as defined in EUA.  Specific high risk criteria : BMI > 25 and Cardiovascular disease or hypertension   I have spoken and communicated the following to the patient or parent/caregiver regarding COVID monoclonal antibody treatment:  1. FDA has authorized the emergency use for the treatment of mild to moderate COVID-19 in adults and pediatric patients with positive results of direct SARS-CoV-2 viral testing who are 59 years of age and older weighing at least 40 kg, and who are at high risk for progressing to severe COVID-19 and/or hospitalization.  2. The significant known and potential risks and benefits of COVID monoclonal antibody, and the extent to which such potential risks and benefits are unknown.  3. Information on available alternative treatments and the risks and benefits of those alternatives, including clinical trials.  4. Patients treated with COVID monoclonal antibody should continue to self-isolate and use infection control measures (e.g., wear mask, isolate, social distance, avoid sharing personal items, clean and disinfect "high touch" surfaces, and frequent handwashing) according to CDC guidelines.   5. The patient or parent/caregiver has the option to accept or refuse COVID monoclonal antibody treatment.  After reviewing this information with the patient, The patient agreed to proceed with  receiving casirivimab\imdevimab infusion and will be provided a copy of the Fact sheet prior to receiving the infusion.   Mauricio Po, NP 03/21/2020 3:27 PM

## 2020-03-21 NOTE — Telephone Encounter (Signed)
Patient called stating that she had called earlier today and spoke to acces nurse. Patient stated she was advised that she needed to be seen within the next 4 hours. Patient stated that she disagrees with that. Patient stated that she had a negative covid test done Wednesday prior to having a procedure on Friday. Patient stated she started with a fever, dry cough, body aches, head congestion and no appetite yesterday. Patient stated that her temperature has been over a 100.0. Patient stated that her eye sockets are real sore. Patient denies SOB, difficulty breathing, or diarrhea. Patient stated that her husband just got home with a home covid test. Advised patient to go ahead and do the test and I will call her back in about 30 minutes to get the results from her.

## 2020-03-21 NOTE — Telephone Encounter (Signed)
Patient notified as instructed by telephone and verbalized understanding. Patient stated that she is interested in the infusion if she qualifies.

## 2020-03-21 NOTE — Telephone Encounter (Signed)
Called to Discuss with patient about Covid symptoms and the use of the monoclonal antibody infusion for those with mild to moderate Covid symptoms and at a high risk of hospitalization.     Pt appears to qualify for this infusion due to co-morbid conditions and/or a member of an at-risk group in accordance with the FDA Emergency Use Authorization.   Currently experiencing fevers, dry cough, congestion, and sore eyes. Symptom onset 8/29. Risk factor includes BMI >25 and history of hyperlipidemia.  At home test is positive on 8/31 and will bring picture.  Ms. Bruck is wishing to proceed with treatment.   [place],   You have been scheduled to receive Regeneron (the monoclonal antibody we discussed) on : 03/22/20 at 5pm   If you have been tested outside of a [facility] - you MUST bring a copy of your positive test with you the morning of your appointment. You may take a photo of this and upload to your MyChart portal or have the testing facility fax the result to [phone]    The address for the infusion clinic site is:  --GPS address is [place] - the parking is located near [employer] building where you will see  COVID19 Infusion feather banner marking the entrance to parking.   (see photos below)            --Enter into the 2nd entrance where the "wave, flag banner" is at the road. Turn into this 2nd entrance and immediately turn left to park in 1 of the 5 parking spots.   --Please stay in your car and call the desk for assistance inside [phone].   --Average time in department is roughly 2 hours for Regeneron treatment - this includes preparation of the medication, IV start and the required 1 hour monitoring after the infusion.    Should you develop worsening shortness of breath, chest pain or severe breathing problems please do not wait for this appointment and go to the Emergency room for evaluation and treatment. You will undergo another  oxygen screen before your infusion to ensure this is the best treatment option for you. There is a chance that the best decision may be to send you to the Emergency Room for evaluation at the time of your appointment.   The day of your visit you should: Marland Kitchen Get plenty of rest the night before and drink plenty of water . Eat a light meal/snack before coming and take your medications as prescribed  . Wear warm, comfortable clothes with a shirt that can roll-up over the elbow (will need IV start).  . Wear a mask  . Consider bringing some activity to help pass the time  Many commercial insurers are waiving bills related to [facility] treatment however some have ranged from $300-640. We are starting to see some insurers send bills to patients later for the administration of the medication - we are learning more information but you may receive a bill after your appointment.  Please contact your insurance agent to discuss prior to your appointment if you would like further details about billing specific to your policy.    Terri Piedra, NP

## 2020-03-21 NOTE — Telephone Encounter (Signed)
Talked to patient and was advised that her home covid test is positive. Patient stated that she does not feel at this point that she needs a appointment or needs to be seen. Patient was advised that she needs to rest, drink plenty of fluids, treat her symptoms and eat well balanced meals. Patient was given ER precautions and urgent care information and she verbalized understanding. Patient was advised that she and her husband need to be in quarantine. Patient was advised that she needs to reach out to anyone that she has been in contact with and let them know that she has tested positive for covid. Patient stated that she will contact her friend that she spent time with over the weekend and the GI doctor's office.

## 2020-03-21 NOTE — Telephone Encounter (Signed)
NOTE: This is report of Access Nurse Call from patient earlier this morning: You can see that our office has already addressed below before receiving this fax report.       [place] Day - Client TELEPHONE ADVICE RECORD [facility] Patient Name: Morgan [name] Gender: Female DOB: 06-25-1946 Age: 74 Y 69 M 13 D Return Phone Number: [phone] (Primary), [phone] (Secondary) Address[place] [address] Client [place] Day - Client Client Site Tye Glori Bickers, [name] - MD Contact Type Call Who Is Calling Patient / Member / Family / Caregiver Call Type Triage / Clinical Relationship To Patient Self Return Phone Number [phone] (Primary) Chief Complaint Weakness, Generalized Reason for Call Symptomatic / Request for [place] has covid symptoms she feels lethargic, has body aches, stuffy nose, eye pain, and has a fever. [place] Not Listed Per office no appointments available, patient was advised to head to nearby urgent care. Patient verbalized understanding Translation No Nurse Assessment Nurse: Sherrell Puller, RN, Amy Date/Time Eilene Ghazi Time): 03/21/2020 9:48:50 AM Confirm and document reason for call. If symptomatic, describe symptoms. ---Caller states she feels lethargic, cough, body aches, nasal congestion, eye pain, temperature last night was 101.8 and this morning is 100.6. Had Covid test last Wednesday because she had an endoscopy on Friday. Started feeling bad on Sunday. Has the patient had close contact with a person known or suspected to have the novel coronavirus illness OR traveled / lives in area with major community spread (including international travel) in the last 14 days from the onset of symptoms? * If Asymptomatic, screen for exposure and travel within the last 14 days. ---No Does the patient have any new  or worsening symptoms? ---Yes Will a triage be completed? ---Yes Related visit to physician within the last 2 weeks? ---No Does the PT have any chronic conditions? (i.e. diabetes, asthma, this includes High risk factors for pregnancy, etc.) ---Yes List chronic conditions. ---GERD, hypothyroidism Is this a behavioral health or substance abuse call? ---No Guidelines Guideline Title Affirmed Question Affirmed Notes Nurse Date/Time (Eastern Time) COVID-19 - Diagnosed or Suspected [1] Fever > 101 F (38.3 C) AND [2] age > 67 years Sherrell Puller, RN, Amy 03/21/2020 9:52:12 AM PLEASE NOTE: All timestamps contained within this report are represented as [place] Standard Time. CONFIDENTIALTY NOTICE: This fax transmission is intended only for the addressee. It contains information that is legally privileged, confidential or otherwise protected from use or disclosure. If you are not the intended recipient, you are strictly prohibited from reviewing, disclosing, copying using or disseminating any of this information or taking any action in reliance on or regarding this information. If you have received this fax in error, please notify us immediately by telephone so that we can arrange for its return to [place]. Phone: [phone], Toll-Free: [phone], Fax: [phone] Page: 2 of 2 Call Id: [id number] [place]. Time Eilene Ghazi Time) Disposition Final User 03/21/2020 9:39:05 AM Attempt made - message left Sherrell Puller, RN, Amy 03/21/2020 9:39:47 AM Attempt made - no message left Sherrell Puller, RN, Amy 03/21/2020 10:02:09 AM See HCP within 4 Hours (or PCP triage) Yes Sherrell Puller, RN, Amy Caller Disagree/Comply Comply Caller Understands Yes PreDisposition Did not know what to do Care Advice Given Per Guideline SEE HCP WITHIN 4 HOURS (OR PCP TRIAGE): * IF OFFICE WILL BE OPEN: You need to be seen within the next 3 or 4 hours. Call your doctor (or NP/PA) now or  as soon as the office opens. GENERAL CARE ADVICE FOR COVID-19 SYMPTOMS: *  The treatment is the same whether you have COVID-19, influenza or some other respiratory virus. * Cough: Use cough drops. * Feeling dehydrated: Drink extra liquids. If the air in your home is dry, use a humidifier. * Fever: For fever over 101 F (38.3 C), take acetaminophen every 4 to 6 hours (Adults 650 mg) OR ibuprofen every 6-8 hours (Adults 400 mg). Before taking any medicine, read all the instructions on the package. Do not take aspirin unless your doctor has prescribed it for you. * Muscle aches, headache, and other pains: Often this comes and goes with the fever. Take acetaminophen every 4-6 hours (Adults 650 mg) OR ibuprofen every 6 to 8 hours (Adults 400 mg). Before taking any medicine, read all the instructions on the package. FEVER MEDICINES: * For fevers above 101 F (38.3 C) take either acetaminophen or ibuprofen. CALL BACK IF: * You become worse. CARE ADVICE given per COVID-19 - DIAGNOSED OR SUSPECTED (Adult) guideline. * RESPONSE: The CDC, WHO, and other experts continue to support the use of ibuprofen (if needed) for patients with COVID-19. They found no scientific evidence to support the claim that ibuprofen made COVID-19 worse. * Discuss only if caller brings up concerns about ibuprofen. NOTE TO TRIAGER - IBUPROFEN CONCERNS: Referrals GO TO FACILITY OTHER - SPECIFY Warm transfer to backline

## 2020-03-21 NOTE — Telephone Encounter (Signed)
  Follow up Call-  Call back number 03/17/2020 04/13/2019  Post procedure Call Back phone  # [phone]  Permission to leave phone message Yes Yes  Some recent data might be hidden     Patient questions:  Do you have a fever, pain , or abdominal swelling? Yes.   101.8 Pain Score  3 *  Have you tolerated food without any problems? Yes.    Have you been able to return to your normal activities? Yes.    Do you have any questions about your discharge instructions: Diet   No. Medications  No. Follow up visit  No.  Do you have questions or concerns about your Care? Yes.   States that she needs another covid test. Will get today. Has history of sinus infections. Will call us with covid results.  Actions: * If pain score is 4 or above: No action needed, pain <4.  1. Have you developed a fever since your procedure? yes  2.   Have you had an respiratory symptoms (SOB or cough) since your procedure? yes  3.   Have you tested positive for COVID 19 since your procedure. no  4.   Have you had any family members/close contacts diagnosed with the COVID 19 since your procedure?  no   If yes to any of these questions please route to Joylene John, RN and Joella Prince, RN  Forwarded to Dr Silverio Decamp, Joella Prince, Joylene John.

## 2020-03-22 ENCOUNTER — Ambulatory Visit (HOSPITAL_COMMUNITY)
Admission: RE | Admit: 2020-03-22 | Discharge: 2020-03-22 | Disposition: A | Discharge status: Home / Self Care | Payer: Medicare Other | Source: Ambulatory Visit | Attending: Pulmonary Disease | Admitting: Pulmonary Disease

## 2020-03-22 ENCOUNTER — Ambulatory Visit: Payer: PPO | Admitting: Family Medicine

## 2020-03-22 DIAGNOSIS — Z23 Encounter for immunization: Secondary | ICD-10-CM | POA: Diagnosis not present

## 2020-03-22 DIAGNOSIS — U071 COVID-19: Secondary | ICD-10-CM | POA: Diagnosis present

## 2020-03-22 DIAGNOSIS — Z6829 Body mass index (BMI) 29.0-29.9, adult: Secondary | ICD-10-CM | POA: Diagnosis present

## 2020-03-22 DIAGNOSIS — E7800 Pure hypercholesterolemia, unspecified: Secondary | ICD-10-CM | POA: Insufficient documentation

## 2020-03-22 MED ORDER — SODIUM CHLORIDE 0.9 % IV SOLN: Freq: Once | INTRAVENOUS | Status: AC

## 2020-03-22 MED ORDER — SODIUM CHLORIDE 0.9 % IV SOLN
1200.0000 mg | Freq: Once | INTRAVENOUS | Status: AC
  Administered 2020-03-22: 1200 mg via INTRAVENOUS

## 2020-03-22 MED ORDER — ALBUTEROL SULFATE HFA 108 (90 BASE) MCG/ACT IN AERS: 2.0000 | INHALATION_SPRAY | Freq: Once | RESPIRATORY_TRACT | Status: DC | PRN

## 2020-03-22 MED ORDER — FAMOTIDINE IN NACL 20-0.9 MG/50ML-% IV SOLN: 20.0000 mg | Freq: Once | INTRAVENOUS | Status: DC | PRN

## 2020-03-22 MED ORDER — METHYLPREDNISOLONE SODIUM SUCC 125 MG IJ SOLR: 125.0000 mg | Freq: Once | INTRAMUSCULAR | Status: DC | PRN

## 2020-03-22 MED ORDER — SODIUM CHLORIDE 0.9 % IV SOLN: INTRAVENOUS | Status: DC | PRN

## 2020-03-22 MED ORDER — DIPHENHYDRAMINE HCL 50 MG/ML IJ SOLN: 50.0000 mg | Freq: Once | INTRAMUSCULAR | Status: DC | PRN

## 2020-03-22 MED ORDER — EPINEPHRINE 0.3 MG/0.3ML IJ SOAJ: 0.3000 mg | Freq: Once | INTRAMUSCULAR | Status: DC | PRN

## 2020-03-22 NOTE — Discharge Instructions (Signed)

## 2020-03-22 NOTE — Progress Notes (Signed)
  Diagnosis: COVID-19  Physician: Dr Asencion Noble  Procedure: Covid Infusion Clinic Med: casirivimab\imdevimab infusion - Provided patient with casirivimab\imdevimab fact sheet for patients, parents and caregivers prior to infusion.  Complications: No immediate complications noted. Pt HR 45 NP ordered NS 552mL bolus. Bolus given HR 50. Lindsey,NP notified. OK to discharge pt. Pt states her HR is usually below 60's.  Discharge: Discharged home   Dorene Sorrow 03/22/2020

## 2020-03-28 NOTE — Telephone Encounter (Signed)
I called and spoke with the patient and informed her that the provider stated she needed lots of fluid and rest.  I informed her to purchase Mucinex DM OVC and to keep an eye on her O2 and that 95-96 % is good I informed her that if it drops below 90% she needed to go to the ER or urgent care but preferred ER and to keep [place] posted on how she is feeling.  She understood.  Morgan [name],cma

## 2020-03-28 NOTE — Telephone Encounter (Signed)
Lots of fluids and rest  Mucinex DM is good for congestion and cough  Treat individual symptoms  Reassuring pulse ox  Keep [place] posted

## 2020-03-28 NOTE — Telephone Encounter (Signed)
Pt called back with an update. 9th day with Covid. No fever. O2 95-96%. Wet cough. Had infusion 03-22-20. Feeling weak today. Has to push herself. Drinking Pedialyte. Asking if that is good for her. Lost taste and smell. Asking what else she should be doing. Call [phone].

## 2020-03-29 ENCOUNTER — Ambulatory Visit: Payer: PPO | Admitting: Dermatology

## 2020-03-29 ENCOUNTER — Telehealth: Payer: Self-pay | Admitting: Family Medicine

## 2020-03-29 NOTE — Telephone Encounter (Signed)
Pt notified of Dr. Marliss Coots comments and instructions and verbalized understanding. Pt did get some mucinex yesterday so she will keep taking it. Pt said overall she feels better. She has no other sxs except the productive cough. Pt will keep taking mucinex and if any new sxs or worsening sxs or if she has any other questions she will call us back

## 2020-03-29 NOTE — Telephone Encounter (Signed)
Patient called in stating she is positive for covid and that she has been having a wet cough. Patient is wondering if an antibiotic can be called in for her. Tested positive 1 week ago and no other symptoms other than wet cough, and previous symptoms she was having. Please advise.

## 2020-03-29 NOTE — Telephone Encounter (Signed)
It is common to still have a wet cough at this stage of the virus and an antibiotic won't help.  An expectorant like mucinex can help loosen phlegm so she can cough it up.  Watch for wheezing or shortness of breath or worsening fever.  How are her other symptoms?

## 2020-03-29 NOTE — Telephone Encounter (Signed)
  Follow up Call-  Call back number 03/17/2020 04/13/2019  Post procedure Call Back phone  # [phone]  Permission to leave phone message Yes Yes  Some recent data might be hidden     Patient questions:  Message left to call  If necessary.

## 2020-04-04 ENCOUNTER — Telehealth: Payer: Self-pay | Admitting: Gastroenterology

## 2020-04-04 MED ORDER — METOCLOPRAMIDE HCL 5 MG PO TABS: 5.0000 mg | ORAL_TABLET | Freq: Three times a day (TID) | ORAL | 1 refills | Status: DC

## 2020-04-04 NOTE — Telephone Encounter (Signed)
Called patient with Dr De Hollingshead recommendations , she will keep her appointment with Dr Silverio Decamp as scheduled

## 2020-04-04 NOTE — Telephone Encounter (Signed)
Ok to trial course of Reglan 5 mg PO AC and HS (QID). Please give 30 day supply with Rf1 and to keep f/u appt with Dr. Silverio Decamp as scheduled. To resume Dexilant. If no improvement, will defer to Dr. Silverio Decamp whether or not to pursue EM and pH/Impedance testing (off PPI) +/- GES based on symptomatology at that time.

## 2020-04-04 NOTE — Telephone Encounter (Signed)
Doc of the day Cirigliano .........  Please advise if patient can take reglan and how do you want to prescribe it   Thanks  Patient is taking dexilant 60 mg and its not helping her completley  she is having acid reflux with non cardiac chest pain  The pharmacist suggest she take reglan with the dexilant   Severe gas all times of the day   Tested positive  for covid on  Aug 31st   Had a CT Heart scan that was normal

## 2020-04-06 ENCOUNTER — Telehealth: Payer: Self-pay | Admitting: Neurology

## 2020-04-06 NOTE — Telephone Encounter (Signed)
Called the pt back to advise that as long as she is past the 14 day window and asymptomatic then this would be fine. She was appreciative for the call.

## 2020-04-06 NOTE — Telephone Encounter (Signed)
Confirming upcoming apt for 9/22 - both she and her husband have recently had COVID but will be 16 and 18 days out from it by apt time next Wednesday. They want to make sure they can be seen. Best call back  [phone]

## 2020-04-12 ENCOUNTER — Encounter: Payer: Self-pay | Admitting: Neurology

## 2020-04-12 ENCOUNTER — Other Ambulatory Visit: Payer: Self-pay

## 2020-04-12 ENCOUNTER — Ambulatory Visit: Payer: PPO | Admitting: Neurology

## 2020-04-12 VITALS — BP 135/74 | HR 50 | Ht 65.0 in | Wt 169.5 lb

## 2020-04-12 DIAGNOSIS — G608 Other hereditary and idiopathic neuropathies: Secondary | ICD-10-CM

## 2020-04-12 NOTE — Patient Instructions (Signed)
Vitamin B12 and Folate Test  Why am I having this test? Vitamin B12 and folate (folic acid) are both B vitamins that are needed to make red blood cells and to keep your nervous system healthy. Vitamin B12 is found in foods such as meats, eggs, dairy products, and fish. Folate is found in fruits, beans, and leafy green vegetables. These vitamins also get added to some foods, such as grains and cereals. You may have a lack (deficiency) of these B vitamins in your body if you do not get enough of them in your diet. Low levels can also be caused by digestive system diseases that interfere with your ability to absorb the vitamins from your food. The most common cause of a vitamin B12 deficiency is the inability to absorb it (pernicious anemia). You may have a vitamin B12 and folate test if:  You have symptoms of vitamin B12 or folate deficiency, such as fatigue, headache, confusion, poor balance, or tingling and numbness.  You are pregnant or breastfeeding. Women who are pregnant or breastfeeding need more folate and may need to take supplements.  Your red blood cell count is low (anemia).  You are an older person and have mental confusion.  You have a disease or condition that may lead to a deficiency of these B vitamins. What is being tested? This test measures the amount of vitamin B12 and folate in your blood. The tests for vitamin B12 and folate may be done together or separately. What kind of sample is taken?  A blood sample is required for this test. It is usually collected by inserting a needle into a blood vessel. How do I prepare for this test? Follow instructions from your health care provider about eating and drinking before the test. Tell a health care provider about:  All medicines you are taking, including vitamins, herbs, eye drops, creams, and over-the-counter medicines.  Any medical conditions you have.  Whether you are pregnant or may be pregnant.  How often you drink  alcohol. How are the results reported? Your test results will be reported as values that identify the amount of vitamin B12 and folate in your blood. Your health care provider will compare your results to normal ranges that were established after testing a large group of people (reference ranges). Reference ranges may vary among labs and hospitals. For this test, common reference ranges are:  Vitamin B12: 160-950 pg/mL or 118-701 pmol/L (SI units).  Folate: 5-25 ng/mL or 11-57 nmol/L (SI units). What do the results mean? Results within the reference range are considered normal. Vitamin B12 or folate levels that are lower than the reference range may be caused by various conditions, including:  Anemia.  Poor nutrition.  Alcoholism.  Liver disease.  Digestive disease. High levels of vitamin B12 are rare, but they may happen if you have:  Cancer.  Diabetes.  Heart failure.  Obesity.  Liver disease.  Human immunodeficiency virus (HIV). High levels of folate may happen if:  You have pernicious anemia.  You are vegetarian.  You have had a recent blood transfusion. Talk with your health care provider about what your results mean. Questions to ask your health care provider Ask your health care provider, or the department that is doing the test:  When will my results be ready?  How will I get my results?  What are my treatment options?  What other tests do I need?  What are my next steps? Summary  Vitamin B12 and folate (folic acid)   are both B vitamins that are needed to make red blood cells and to keep your nervous system healthy.  You may have a lack (deficiency) of these B vitamins in your body if you do not get enough of them in your diet or if you have a digestive system disease.  This test measures the amount of vitamin B12 and folate in your blood. A blood sample is required for the test.  Talk with your health care provider about what your results mean.  This information is not intended to replace advice given to you by your health care provider. Make sure you discuss any questions you have with your health care provider. Document Revised: 02/05/2019 Document Reviewed: 03/03/2017 Elsevier Patient Education  2020 Elsevier Inc.  

## 2020-04-12 NOTE — Progress Notes (Signed)
Provider:  Larey Seat, M D  Referring Provider: Abner Greenspan, MD Primary Care Physician:  Tower, Wynelle Fanny, MD  Chief Complaint  Patient presents with  . Follow-up    Room 11. Pt here with husband, Morgan [name].     HPI:  Morgan [name] is a 74 year-old female , and seen here in tandem with her husband, Morgan [name], MDM the patient noticed first in 2007 that the second toe of her right foot developed numbness from their arm the numbness has progressed to all 10 toes at the bottom of the feet.  Recent nerve conduction study was EMG was negative , but a small fiber neuropathy cannot be ruled out and it does seem to be a neuropathy of this kind.  It is symmetric and it is peripheral ascending slowly which is a typical small fiber distribution.  The patient does have spinal stenosis with some exit foraminal stenosis for the S1 nerve but the area between her big toe and the fourth toe is not affected selectively.  She also has not developed a foot drop or gait instability.  I offered that should she develop discomfort or pain we can use medications that none these unpleasant sensations but they also do not restore normal sensation.  We can also do a biopsy for sensory neuropathy but usually this is also chosen for patients that have pain with sensory neuropathy.  There are very few sensory neuropathies that are reversible / treatable: sometimes with IVIG in autoimmune related NP and often we have to prevent or treat thyroid disease, diabetes,  sometimes peripheral neuropathy as a side effect of medications used in chemotherapy or cardiac arrhythmia or alcohol use.  She has thyroid nodules and is on medication. They have not grown. She has GERD.       and seen in a new visit after more that 3 year- Interval. She has been released from Dr Morgan [name] , who had seen her for connective tissue disease.  Mrs. Morgan [name] had been seen here a little bit over 3 years ago and at the time she had decreased  sensation to soft touch in her toes and the anterior foot originally it only affected to toes but now has progressed to all 10 and both feet.  She has a newer development which is a numbness and in the back of the gastrocnemius muscle with these paresthesias are not just foot paresthesias that actually ascended towards the knee they have not crossed the knee and there is no such abnormal sensation in her hands. Dr Glori Bickers wanted her to have NCV and EMG.      08-2016, here as a referral from Dr. Jerilynn Mages. Tower for a neuropathy work up. She is the spouse of an established patient and requested this referral.   Chief complaint according to patient :  Patient reports that she started having numbness in her toes starting in 2007. Now has numbness in the bottom of her feet, coldness in toes, and now has coolness in her hands.     Mrs. Morgan [name] reports progressive numbness beginning about 10 years ago. It has affected the most peripheral body parts at first her toes. Since then it has progressed slightly but the numbness has become more profound. She does not experience pain she does not experience pin and needle dysesthesias for example. She has been checked for endocrine disorders and has  hypothyroidism , but not  Diabetes.  Nodular thyroid is followed endocrinology for the last 5  years.  When she was pregnant with her twin boys she also did not have neuropathic symptoms yet. 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  She has followed her rheumatologist, Dr. Estanislado [name].  She has undergone repeated antinuclear antibody testing, protein electrophoresis, and metabolic panels to check if any endocrine disorder had developed meanwhile. I have no doubt that she has a sensory neuropathy. Her hands have become cold , too.  She ost hair on her legs and toes. Her skin has become shiny . She is  sweating on either palm or soles.    Social history:  Married, twin  sons  18 years old. , another 3 rd son 52  and a daughter 34  Review of Systems: Out of a complete 14 system review, the patient complains of only the following symptoms, and all other reviewed systems are negative.  Numbness in toes , with loss of vibration but only decreased sense of pin prick and fine touch.  She now has a bit of pain -    Social History   Socioeconomic History  . Marital status: Married    Spouse name: Not on file  . Number of children: 4  . Years of education: HS  . Highest education level: Not on file  Occupational History  . Occupation: Retired   Tobacco Use  . Smoking status: Former Smoker    Types: Cigarettes    Quit date: 07/22/1965    Years since quitting: 54.7  . Smokeless tobacco: Never Used  Vaping Use  . Vaping Use: Never used  Substance and Sexual Activity  . Alcohol use: No  . Drug use: No  . Sexual activity: Yes    Birth control/protection: Surgical    Comment: BTL  Other Topics Concern  . Not on file  Social History Narrative   + caffeine use     Social Determinants of Health   Financial Resource Strain:   . Difficulty of Paying Living Expenses: Not on file  Food Insecurity:   . Worried About Charity fundraiser in the Last Year: Not on file  . Ran Out of Food in the Last Year: Not on file  Transportation Needs:   . Lack of Transportation (Medical): Not on file  . Lack of Transportation (Non-Medical): Not on file  Physical Activity:   . Days of Exercise per Week: Not on file  . Minutes of Exercise per Session: Not on file  Stress:   . Feeling of Stress : Not on file  Social Connections:   . Frequency of Communication with Friends and Family: Not on file  . Frequency of Social Gatherings with Friends and Family: Not on file  . Attends Religious Services: Not on file  . Active Member of Clubs or Organizations: Not on file  . Attends Archivist Meetings: Not on file  . Marital Status: Not on file  Intimate Partner  Violence:   . Fear of Current or Ex-Partner: Not on file  . Emotionally Abused: Not on file  . Physically Abused: Not on file  . Sexually Abused: Not on file    Family History  Problem Relation Age of Onset  . Coronary artery disease Father   . Heart attack Father        deceased at 48  . Dementia Mother   . Diabetes Maternal Grandmother   . Goiter Maternal Grandmother   . Diabetes Cousin   . Cancer  Grandchild        rare type (dying at age 54)  . Colon cancer Neg Hx   . Esophageal cancer Neg Hx   . Stomach cancer Neg Hx   . Rectal cancer Neg Hx   . Colon polyps Neg Hx     Past Medical History:  Diagnosis Date  . Allergy   . Bradycardia   . Collagen vascular disease ([place])   . Connective tissue disease ([place])   . DDD (degenerative disc disease), cervical   . DDD (degenerative disc disease), lumbar   . Endometrial polyp    not resolved with D&C (following)  . GERD (gastroesophageal reflux disease)    gas/belching issues-was on protonix for short time-not since 6 months  . HLD (hyperlipidemia)   . Mixed connective tissue disease ([place]) diagnosed 5 years ago   no problems since diagnosis  . Multiple thyroid nodules 01/24/2014  . Obesity   . Osteoarthritis of cervical spine   . PONV (postoperative nausea and vomiting)   . Renal calculi   . Spinal stenosis     Past Surgical History:  Procedure Laterality Date  . APPENDECTOMY    . COLONOSCOPY    . DILATION AND CURETTAGE OF UTERUS     fibroids  . DILATION AND CURETTAGE OF UTERUS  01/2005   endometrial polyp  . ENDOMETRIAL BIOPSY  2008   fibroid  . HAND SURGERY     carpal tunnel release bilateral Dr. Daylene Katayama  . hysteroscopic resection      polyps of PMB  . TONSILLECTOMY    . TUBAL LIGATION     1973  . UPPER GASTROINTESTINAL ENDOSCOPY      Current Outpatient Medications  Medication Sig Dispense Refill  . dexlansoprazole (DEXILANT) 60 MG capsule Take 1 capsule (60 mg total) by mouth daily. 30 capsule 6  .  levothyroxine (SYNTHROID, LEVOTHROID) 50 MCG tablet Take 50 mcg 3 times a week (Mon, Wed, Fri),25 mcg 4 times a week (Tues, Rudy, Sat, Sun)    . metoCLOPramide (REGLAN) 5 MG tablet Take 1 tablet (5 mg total) by mouth 4 (four) times daily -  before meals and at bedtime. 120 tablet 1  . OVER THE COUNTER MEDICATION STOOL SOFTNER     No current facility-administered medications for this visit.    Allergies as of 04/12/2020 - Review Complete 04/12/2020  Allergen Reaction Noted  . Codeine Nausea And Vomiting 01/19/2007  . Hydrocodone-acetaminophen  01/19/2007  . Penicillins Hives 01/19/2007    Vitals: BP 135/74   Pulse (!) 50   Ht 5\' 5"  (1.651 m)   Wt 169 lb 8 oz (76.9 kg)   LMP 07/22/1989 (Approximate)   BMI 28.21 kg/m  Last Weight:  Wt Readings from Last 1 Encounters:  04/12/20 169 lb 8 oz (76.9 kg)   CHE:NIDP mass index is 28.21 kg/m.     Last Height:   Ht Readings from Last 1 Encounters:  04/12/20 5\' 5"  (1.651 m)    Physical exam:  General: The patient is awake, alert and appears not in acute distress. The patient is well groomed. Head: Normocephalic, atraumatic.  Cardiovascular:  Regular rate and rhythm , without  murmurs or carotid bruit, and without distended neck veins. Respiratory: Lungs are clear to auscultation. Skin:  Without evidence of edema, or rash Trunk: BMI is 29.  The patient's posture is erect   Neurologic exam : The patient is awake and alert, oriented to place and time.    MOCA:No flowsheet data found.  MMSE: MMSE - Mini Mental State Exam 01/06/2018 12/04/2016 09/22/2015  Orientation to time 5 5 5   Orientation to Place 5 5 5   Registration 3 3 3   Attention/ Calculation 0 0 5  Recall 3 3 3   Language- name 2 objects 0 0 0  Language- repeat 1 1 1   Language- follow 3 step command 3 3 3   Language- read & follow direction 0 0 1  Write a sentence 0 0 0  Copy design 0 0 0  Total score 20 20 26    Attention span & concentration ability appears normal.    Speech is fluent, without dysarthria, dysphonia or aphasia.  Mood and affect are appropriate.  Cranial nerves: Pupils are equal and briskly reactive to light. Funduscopic exam withoutevidence of pallor or edema. Extraocular movements  in vertical and horizontal planes intact and without nystagmus. Visual fields by finger perimetry are intact. Hearing to finger rub intact. Facial sensation intact to fine touch.Facial motor strength is symmetric and tongue and uvula move midline. Shoulder shrug was symmetrical.   Motor exam: Normal tone, muscle bulk and symmetric strength in all extremities. Sensory:   Fine touch, pinprick and vibration were tested in all extremities- and her feet are definitely numb.  Proprioception tested in the upper extremities was normal. Coordination: Rapid alternating movements in the fingers/hands was normal.  Finger-to-nose maneuver  normal without evidence of ataxia, dysmetria or tremor. Gait and station: Patient walks without assistive device and is able unassisted to climb up to the exam table. Strength within normal limits.Stance is stable and normal.  Toe and heel stand were tested .Tandem gait is unfragmented.  Turns with 3 Steps.  Romberg testing is negative.  Deep tendon reflexes: in the upper and lower extremities are symmetric and intact. Babinski maneuver response is downgoing.  The patient was advised of the nature of the diagnosed sleep disorder , the treatment options and risks for general a health and wellness arising from not treating the condition.  I spent more than 30 minutes of face to face time with the patient. Greater than 50% of time was spent in counseling and coordination of care. We have discussed the diagnosis and differential and I answered the patient's questions.     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 Small fiber neuropathy-  There may be a slight dysautonomic component as she has lost hair growth on her legs , the skin is more shiny. She developed the first symptoms over 12 years ago , 5 years before she was diagnosed with hypothyroidism. She was diagnosed with an unspecified connective tissue disorder and has been followed by rheumatology since 2007. These neuropathies are small fiber related, and cannot be diagnosed by NCV,   Plan:  Treatment plan and additional workup :  I continue to recommended a Vit B complex, Methylated B 12, probiotic, Vit D and Biotin.  If she should develop pain , we can treat with Lyrica or Gabapentin. PRN RV   Asencion Partridge Ottilia Pippenger MD  04/12/2020   CC: [address], [place], [place],  [place] [address]

## 2020-05-11 ENCOUNTER — Encounter: Payer: Self-pay | Admitting: Family Medicine

## 2020-05-11 DIAGNOSIS — Z1231 Encounter for screening mammogram for malignant neoplasm of breast: Secondary | ICD-10-CM | POA: Diagnosis not present

## 2020-05-11 DIAGNOSIS — Z780 Asymptomatic menopausal state: Secondary | ICD-10-CM | POA: Diagnosis not present

## 2020-05-16 DIAGNOSIS — Z20828 Contact with and (suspected) exposure to other viral communicable diseases: Secondary | ICD-10-CM | POA: Diagnosis not present

## 2020-05-17 ENCOUNTER — Other Ambulatory Visit: Payer: Self-pay

## 2020-05-17 ENCOUNTER — Ambulatory Visit: Payer: PPO | Admitting: Dermatology

## 2020-05-17 ENCOUNTER — Encounter: Payer: Self-pay | Admitting: Dermatology

## 2020-05-17 DIAGNOSIS — L570 Actinic keratosis: Secondary | ICD-10-CM | POA: Diagnosis not present

## 2020-05-17 DIAGNOSIS — T148XXA Other injury of unspecified body region, initial encounter: Secondary | ICD-10-CM

## 2020-05-17 DIAGNOSIS — S91102A Unspecified open wound of left great toe without damage to nail, initial encounter: Secondary | ICD-10-CM | POA: Diagnosis not present

## 2020-05-17 DIAGNOSIS — B078 Other viral warts: Secondary | ICD-10-CM | POA: Diagnosis not present

## 2020-05-17 DIAGNOSIS — D171 Benign lipomatous neoplasm of skin and subcutaneous tissue of trunk: Secondary | ICD-10-CM

## 2020-05-17 DIAGNOSIS — L820 Inflamed seborrheic keratosis: Secondary | ICD-10-CM

## 2020-05-17 DIAGNOSIS — L219 Seborrheic dermatitis, unspecified: Secondary | ICD-10-CM

## 2020-05-17 DIAGNOSIS — Z8616 Personal history of COVID-19: Secondary | ICD-10-CM

## 2020-05-17 DIAGNOSIS — L821 Other seborrheic keratosis: Secondary | ICD-10-CM | POA: Diagnosis not present

## 2020-05-17 DIAGNOSIS — L309 Dermatitis, unspecified: Secondary | ICD-10-CM | POA: Diagnosis not present

## 2020-05-17 MED ORDER — MUPIROCIN 2 % EX OINT: 1.0000 "application " | TOPICAL_OINTMENT | Freq: Every day | CUTANEOUS | 0 refills | Status: DC

## 2020-05-17 MED ORDER — CLOBETASOL PROPIONATE 0.05 % EX FOAM: Freq: Two times a day (BID) | CUTANEOUS | 5 refills | Status: DC

## 2020-05-17 MED ORDER — HALOBETASOL PROPIONATE 0.05 % EX CREA: TOPICAL_CREAM | Freq: Two times a day (BID) | CUTANEOUS | 2 refills | Status: AC | PRN

## 2020-05-17 NOTE — Patient Instructions (Addendum)
Cryotherapy Aftercare  . Wash gently with soap and water everyday.   Marland Kitchen Apply Vaseline and Band-Aid daily until healed.  Prior to procedure, discussed risks of blister formation, small wound, skin dyspigmentation, or rare scar following cryotherapy.   Topical steroids (such as triamcinolone, fluocinolone, fluocinonide, mometasone, clobetasol, halobetasol, betamethasone, hydrocortisone) can cause thinning and lightening of the skin if they are used for too long in the same area. Your physician has selected the right strength medicine for your problem and area affected on the body. Please use your medication only as directed by your physician to prevent side effects.   Recommend Gold Bond Rapid Relief Anti-Itch cream up to 3 times per day to areas that are itchy.

## 2020-05-17 NOTE — Progress Notes (Signed)
Follow-Up Visit   Subjective  Morgan [name] is a 74 y.o. female who presents for the following: Skin Problem (Patient here today for some spots at arms and legs that are rough and itching. No history of skin cancer. ).  Patient also would like refills for clobetasol foam and HC 2.5% which she uses for seborrheic dermatitis. She also wants refills for TMC and halobetasol. She uses halobetasol cream for her chronic, recurrent dermatitis of unclear etiology.  She says it is the only thing that works but that it works well.  Patient had Covid in September and had horrible itching, she thinks it could be what she heard is Covid rash.   She notes that her scalp is active currently and very itchy.  She notes there is a lump at her right side.  She notes that there is a spot at her left elbow that has not gone away with creams.   A spot at her mid forehead also did not go away after cryotherapy.  The following portions of the chart were reviewed this encounter and updated as appropriate:  Tobacco   Allergies   Meds   Problems   Med Hx   Surg Hx   Fam Hx       Review of Systems:  No other skin or systemic complaints except as noted in HPI or Assessment and Plan.  Objective  Well appearing patient in no apparent distress; mood and affect are within normal limits.  A focused examination was performed including face, neck, chest and back and arms, legs, scalp. Relevant physical exam findings are noted in the Assessment and Plan.  Objective  Scalp: Scale and erythema  Objective  Left antecubital fossa x 1, mid forehead x 1 (2): Erythematous thin papules/macules with gritty scale.   Objective  Right Upper Arm x 1, left pretibia x 1 (2): Verrucous papules  Objective  Right Flank: Subcutaneous nodule  Objective  Left Forearm x 1, left upper arm x 1 (2): Erythematous keratotic or waxy stuck-on papule or plaque.   Objective  Left great toe: Open wound  Objective  Legs and  arms: Thin scaly pink plaques   Assessment & Plan  Seborrheic dermatitis Scalp  Chronic, currently flared   Discussed adding ketoconazole shampoo but she defers at this time.    Cont clobetasol foam to affected areas at scalp twice a day as needed. May spot treat areas at ears as needed. Avoid applying to face, groin, and axilla. Use as directed. Risk of skin atrophy with long-term use reviewed.    Topical steroids (such as triamcinolone, fluocinolone, fluocinonide, mometasone, clobetasol, halobetasol, betamethasone, hydrocortisone) can cause thinning and lightening of the skin if they are used for too long in the same area. Your physician has selected the right strength medicine for your problem and area affected on the body. Please use your medication only as directed by your physician to prevent side effects.    Ordered Medications: clobetasol (OLUX) 0.05 % topical foam  AK (actinic keratosis) (2) Left antecubital fossa x 1, mid forehead x 1  Prior to procedure, discussed risks of blister formation, small wound, skin dyspigmentation, or rare scar following cryotherapy.   We will reevaluate forehead in clinic in follow-up.  Consider biopsy if indicated.  She deferred biopsy today.  Destruction of lesion - Left antecubital fossa x 1, mid forehead x 1 Complexity: simple   Destruction method: cryotherapy   Informed consent: discussed and consent obtained   Lesion destroyed  using liquid nitrogen: Yes   Cryotherapy cycles:  2 Outcome: patient tolerated procedure well with no complications   Post-procedure details: wound care instructions given    Other viral warts (2) Right Upper Arm x 1, left pretibia x 1  Discussed viral etiology and risk of spread.    Prior to procedure, discussed risks of blister formation, small wound, skin dyspigmentation, or rare scar following cryotherapy.    Destruction of lesion - Right Upper Arm x 1, left pretibia x 1 Complexity: simple    Destruction method: cryotherapy   Informed consent: discussed and consent obtained   Lesion destroyed using liquid nitrogen: Yes   Cryotherapy cycles:  2 Outcome: patient tolerated procedure well with no complications   Post-procedure details: wound care instructions given    Lipoma of torso Right Flank  Benign-appearing.  Observation.  Call clinic for new or changing lesions.   Inflamed seborrheic keratosis (2) Left Forearm x 1, left upper arm x 1  Prior to procedure, discussed risks of blister formation, small wound, skin dyspigmentation, or rare scar following cryotherapy.    Destruction of lesion - Left Forearm x 1, left upper arm x 1 Complexity: simple   Destruction method: cryotherapy   Informed consent: discussed and consent obtained   Lesion destroyed using liquid nitrogen: Yes   Cryotherapy cycles:  2 Outcome: patient tolerated procedure well with no complications   Post-procedure details: wound care instructions given    Open wound Left great toe  Start mupirocin daily and cover with band aid.   Ordered Medications: mupirocin ointment (BACTROBAN) 2 %  Dermatitis Legs and arms  Cont halobetasol cream twice a day up to 2 weeks as needed. Avoid applying to face, groin, and axilla. Use as directed. Risk of skin atrophy with long-term use reviewed.   Topical steroids (such as triamcinolone, fluocinolone, fluocinonide, mometasone, clobetasol, halobetasol, betamethasone, hydrocortisone) can cause thinning and lightening of the skin if they are used for too long in the same area. Your physician has selected the right strength medicine for your problem and area affected on the body. Please use your medication only as directed by your physician to prevent side effects.   Ordered Medications: halobetasol (ULTRAVATE) 0.05 % cream  Seborrheic Keratoses - Stuck-on, waxy, tan-brown papules and plaques  - Discussed benign etiology and prognosis. - Observe - Call for any  changes   Return in about 2 months (around 07/17/2020) for AK follow up, recheck erosion at right forearm.  Graciella Belton, RMA, am acting as scribe for Forest Gleason, MD .  Documentation: I have reviewed the above documentation for accuracy and completeness, and I agree with the above.  Forest Gleason, MD

## 2020-05-18 ENCOUNTER — Encounter: Payer: Self-pay | Admitting: Family Medicine

## 2020-06-02 ENCOUNTER — Encounter: Payer: Self-pay | Admitting: Gastroenterology

## 2020-06-02 ENCOUNTER — Ambulatory Visit: Payer: PPO | Admitting: Gastroenterology

## 2020-06-02 VITALS — BP 142/80 | HR 74 | Ht 64.25 in | Wt 166.0 lb

## 2020-06-02 DIAGNOSIS — K219 Gastro-esophageal reflux disease without esophagitis: Secondary | ICD-10-CM | POA: Diagnosis not present

## 2020-06-02 DIAGNOSIS — R0789 Other chest pain: Secondary | ICD-10-CM | POA: Diagnosis not present

## 2020-06-02 DIAGNOSIS — K5900 Constipation, unspecified: Secondary | ICD-10-CM

## 2020-06-02 NOTE — Patient Instructions (Signed)
Continue Dexilant 60 mg daily  Take Gaviscon after meals three times a day as needed  Take Benefiber 1 tablespoon twice daily  Continue the stool softeners as needed  Follow up in 4 months  If you are age 73 or older, your body mass index should be between 23-30. Your Body mass index is 28.27 kg/m. If this is out of the aforementioned range listed, please consider follow up with your Primary Care Provider.  If you are age 20 or younger, your body mass index should be between 19-25. Your Body mass index is 28.27 kg/m. If this is out of the aformentioned range listed, please consider follow up with your Primary Care Provider.    I appreciate the  opportunity to care for you  Thank You   Harl Bowie , MD

## 2020-06-02 NOTE — Progress Notes (Signed)
Morgan [name]    [id number]    1946-03-30  Primary Care Physician:Tower, Wynelle Fanny, MD  Referring Physician: Tower, Wynelle Fanny, MD [place],  [place] [address]   Chief complaint: GERD, atypical chest pain, constipation  HPI: 74 year old very pleasant female here for follow-up visit for GERD and constipation She has not had any severe chest pain episodes since she started taking Dexilant, very infrequent breakthrough symptoms of GERD once every 1 to 2 weeks.  Denies any dysphagia, odynophagia, vomiting or abdominal pain. She is having constipation with pencil thin stool and also has noticed change in color is more bright yellow.  She had an episode of small bowel obstruction few years ago from adhesions from previous surgery of tubal ligation and she is s/p appendectomy She is taking daily stool softener with daily bowel movement.  Denies any rectal bleeding or melena.  EGD March 17, 2020: LA grade B reflux esophagitis, gastroesophageal flap valve Hill grade 3, small hiatal hernia. Colonoscopy April 13, 2019: Left-sided diverticulosis and hemorrhoids otherwise unremarkable exam.  Outpatient Encounter Medications as of 06/02/2020  Medication Sig  . clobetasol (OLUX) 0.05 % topical foam Apply topically 2 (two) times daily.  Marland Kitchen dexlansoprazole (DEXILANT) 60 MG capsule Take 1 capsule (60 mg total) by mouth daily.  . halobetasol (ULTRAVATE) 0.05 % cream Apply topically.  . hydrocortisone valerate cream (WESTCORT) 0.2 % Apply topically.  Marland Kitchen levothyroxine (SYNTHROID, LEVOTHROID) 50 MCG tablet Take 50 mcg 3 times a week (Mon, Wed, Fri),25 mcg 4 times a week (Tues, [place], Sat, Sun)  . metoCLOPramide (REGLAN) 5 MG tablet Take 1 tablet (5 mg total) by mouth 4 (four) times daily -  before meals and at bedtime.  . mupirocin ointment (BACTROBAN) 2 % Apply 1 application topically daily.  Marland Kitchen OVER THE COUNTER MEDICATION STOOL SOFTNER  . triamcinolone cream (KENALOG) 0.1  % Apply topically.   No facility-administered encounter medications on file as of 06/02/2020.    Allergies as of 06/02/2020 - Review Complete 06/02/2020  Allergen Reaction Noted  . Codeine Nausea And Vomiting 01/19/2007  . Hydrocodone-acetaminophen  01/19/2007  . Penicillins Hives 01/19/2007    Past Medical History:  Diagnosis Date  . Allergy   . Bradycardia   . Collagen vascular disease ([place])   . Connective tissue disease ([place])   . DDD (degenerative disc disease), cervical   . DDD (degenerative disc disease), lumbar   . Endometrial polyp    not resolved with D&C (following)  . GERD (gastroesophageal reflux disease)    gas/belching issues-was on protonix for short time-not since 6 months  . HLD (hyperlipidemia)   . Mixed connective tissue disease ([place]) diagnosed 5 years ago   no problems since diagnosis  . Multiple thyroid nodules 01/24/2014  . Obesity   . Osteoarthritis of cervical spine   . PONV (postoperative nausea and vomiting)   . Renal calculi   . Spinal stenosis     Past Surgical History:  Procedure Laterality Date  . APPENDECTOMY    . COLONOSCOPY    . DILATION AND CURETTAGE OF UTERUS     fibroids  . DILATION AND CURETTAGE OF UTERUS  01/2005   endometrial polyp  . ENDOMETRIAL BIOPSY  2008   fibroid  . HAND SURGERY     carpal tunnel release bilateral Dr. Daylene Katayama  . hysteroscopic resection      polyps of PMB  . TONSILLECTOMY    . TUBAL  LIGATION     1973  . UPPER GASTROINTESTINAL ENDOSCOPY      Family History  Problem Relation Age of Onset  . Coronary artery disease Father   . Heart attack Father        deceased at 43  . Dementia Mother   . Diabetes Maternal Grandmother   . Goiter Maternal Grandmother   . Diabetes Cousin   . Cancer Grandchild        rare type (dying at age 24)  . Colon cancer Neg Hx   . Esophageal cancer Neg Hx   . Stomach cancer Neg Hx   . Rectal cancer Neg Hx   . Colon polyps Neg Hx     Social History   Socioeconomic  History  . Marital status: Married    Spouse name: Not on file  . Number of children: 4  . Years of education: HS  . Highest education level: Not on file  Occupational History  . Occupation: Retired   Tobacco Use  . Smoking status: Former Smoker    Types: Cigarettes    Quit date: 07/22/1965    Years since quitting: 54.9  . Smokeless tobacco: Never Used  Vaping Use  . Vaping Use: Never used  Substance and Sexual Activity  . Alcohol use: No  . Drug use: No  . Sexual activity: Yes    Birth control/protection: Surgical    Comment: BTL  Other Topics Concern  . Not on file  Social History Narrative   + caffeine use     Social Determinants of Health   Financial Resource Strain:   . Difficulty of Paying Living Expenses: Not on file  Food Insecurity:   . Worried About Charity fundraiser in the Last Year: Not on file  . Ran Out of Food in the Last Year: Not on file  Transportation Needs:   . Lack of Transportation (Medical): Not on file  . Lack of Transportation (Non-Medical): Not on file  Physical Activity:   . Days of Exercise per Week: Not on file  . Minutes of Exercise per Session: Not on file  Stress:   . Feeling of Stress : Not on file  Social Connections:   . Frequency of Communication with Friends and Family: Not on file  . Frequency of Social Gatherings with Friends and Family: Not on file  . Attends Religious Services: Not on file  . Active Member of Clubs or Organizations: Not on file  . Attends Archivist Meetings: Not on file  . Marital Status: Not on file  Intimate Partner Violence:   . Fear of Current or Ex-Partner: Not on file  . Emotionally Abused: Not on file  . Physically Abused: Not on file  . Sexually Abused: Not on file      Review of systems: All other review of systems negative except as mentioned in the HPI.   Physical Exam: Vitals:   06/02/20 1320  BP: (!) 142/80  Pulse: 74  SpO2: 98%   Body mass index is 28.27 kg/m. Gen:       No acute distress Psych: normal mood and affect  Data Reviewed:  Reviewed labs, radiology imaging, old records and pertinent past GI work up   Assessment and Plan/Recommendations:  74 year old very pleasant female, appears younger than stated age with GERD and chronic idiopathic constipation  GERD: Continue Dexilant 60 mg daily for additional 3 to 4 months If continues to have good symptom control will plan to decrease  the dose of Dexilant to 30 mg daily and plan to taper it off over the next few months Continue antireflux measures Gaviscon 1 tablet after meals as needed up to 3 times daily  Ok to use 1 capsule of multivitamin daily   Chronic idiopathic constipation: Start Benefiber 1 tablespoon twice daily with meals Continue with increased water intake Continue stool softener or MiraLAX half capful daily as needed  Colorectal cancer screening: Average risk, no polyps on exam in September 2020  Return in 3 to 4 months or sooner if needed  This visit required 30 minutes of patient care (this includes precharting, chart review, review of results, face-to-face time used for counseling as well as treatment plan and follow-up. The patient was provided an opportunity to ask questions and all were answered. The patient agreed with the plan and demonstrated an understanding of the instructions.  Damaris Hippo , MD    CC: Tower, Wynelle Fanny, MD

## 2020-07-04 ENCOUNTER — Other Ambulatory Visit: Payer: Self-pay

## 2020-07-04 ENCOUNTER — Ambulatory Visit (INDEPENDENT_AMBULATORY_CARE_PROVIDER_SITE_OTHER): Payer: PPO | Admitting: Family Medicine

## 2020-07-04 ENCOUNTER — Encounter: Payer: Self-pay | Admitting: Family Medicine

## 2020-07-04 VITALS — BP 124/76 | HR 56 | Temp 96.9°F | Ht 64.25 in | Wt 164.2 lb

## 2020-07-04 DIAGNOSIS — Z23 Encounter for immunization: Secondary | ICD-10-CM | POA: Diagnosis not present

## 2020-07-04 DIAGNOSIS — Z79899 Other long term (current) drug therapy: Secondary | ICD-10-CM | POA: Insufficient documentation

## 2020-07-04 DIAGNOSIS — E039 Hypothyroidism, unspecified: Secondary | ICD-10-CM

## 2020-07-04 DIAGNOSIS — R195 Other fecal abnormalities: Secondary | ICD-10-CM | POA: Diagnosis not present

## 2020-07-04 DIAGNOSIS — R142 Eructation: Secondary | ICD-10-CM | POA: Diagnosis not present

## 2020-07-04 NOTE — Assessment & Plan Note (Signed)
Continues levothyroxine and visits with Dr Donella Stade

## 2020-07-04 NOTE — Assessment & Plan Note (Signed)
Pt is taking dexilant which has helped greatly for severe GERD symptoms  At next lab draw (for PE) we will add vit B12 level She is concerned about long term effects  inst her to keep taking it

## 2020-07-04 NOTE — Patient Instructions (Addendum)
If you think you are constipated- use miralax 2-3 times per day for a week to try and clean things out  Drink 64 oz of fluid daily   Then you can get back on benefiber  You can also use miralax as often as needed   Keep eating lots of fruits and vegetables    Talk to Dr Silverio Decamp about stool caliber and color change   Continue the dexilant   Flu shot today

## 2020-07-04 NOTE — Assessment & Plan Note (Signed)
Pt has had more narrow stools- unsure how long Last colonoscopy 9/20 showed tics and hemorrhoids (5 y recall)  Disc poss of hemorrhoids or constipation causing the problem  Plan made to tx constipation more aggressively with miralax to clean out  Also fiber/fluids(inc fluids to 64 oz per day)  Color change of stool (more yellow)- ? Etiology or significance Will check LFTs at next labs  Pt will also check in with her GI provider

## 2020-07-04 NOTE — Assessment & Plan Note (Signed)
Much improved with dexilant/tx of GERD Continues GI f/u

## 2020-07-04 NOTE — Progress Notes (Signed)
Subjective:    Patient ID: Morgan [name], female    DOB: [birth year], 74 y.o.   MRN: [id number]  This visit occurred during the SARS-CoV-2 public health emergency.  Safety protocols were in place, including screening questions prior to the visit, additional usage of staff PPE, and extensive cleaning of exam room while observing appropriate contact time as indicated for disinfecting solutions.    HPI Pt presents with GI problem Wt Readings from Last 3 Encounters:  07/04/20 164 lb 4 oz (74.5 kg)  06/02/20 166 lb (75.3 kg)  04/12/20 169 lb 8 oz (76.9 kg)   27.97 kg/m  Suffers with constipation  She took a stool softener (one daily)  Then benefiber twice daily   She watches her stool  For months she has had a smaller caliber stool and the color yellow-ish   no blood in stool  No abdominal pain   (her symptoms are from reflux-in chest)  Her heartburn symptoms come and go   Has gas -both belching and flactulance   Lab Results  Component Value Date   ALT 16 02/02/2019   AST 20 02/02/2019   ALKPHOS 90 02/02/2019   BILITOT 0.5 02/02/2019   Lab Results  Component Value Date   WBC 5.5 02/07/2020   HGB 13.3 02/07/2020   HCT 41.4 02/07/2020   MCV 93.0 02/07/2020   PLT 203 02/07/2020     Had covid in aug/sept and had ab infusion  Sees Dr Silverio Decamp for GI  Takes dexilant for gerd/dyspepsia Had severe symptoms and now much improved   Recent endoscopy   Last colonoscopy 9/20-diverticulosis and hemorrhoids with 5 y recall   Has hypothyroidism  Sees Dr Ronnald Collum  Has thyroid nodules and has had needle bx in the past    Patient Active Problem List   Diagnosis Date Noted  . Current use of proton pump inhibitor 07/04/2020  . Change in stool caliber 07/04/2020  . Idiopathic small fiber sensory neuropathy 04/12/2020  . Chest tightness 02/25/2020  . Belching 02/09/2020  . Atypical chest pain 02/09/2020  . Vitamin D deficiency 10/05/2019  . Neck pain 03/02/2019  .  Colon cancer screening 02/09/2019  . Snoring 08/06/2017  . Nasal congestion 08/06/2017  . Paresthesia of foot, bilateral 08/12/2016  . Encounter for Medicare annual wellness exam 04/13/2014  . Allergic rhinitis 04/13/2014  . Routine general medical examination at a health care facility 04/07/2014  . Multiple thyroid nodules 01/24/2014  . History of small bowel obstruction 01/20/2014  . Low back strain 01/07/2014  . DDD (degenerative disc disease), cervical   . DDD (degenerative disc disease), lumbar   . Spinal stenosis   . Obese   . TINNITUS 06/08/2010  . NONSPECIFIC ABNORMAL TOXICOLOGICAL FINDINGS 05/11/2010  . Hypothyroidism 04/18/2010  . BRADYCARDIA 05/31/2008  . GERD 05/03/2008  . Dyspepsia 05/03/2008  . DIVERTICULOSIS OF COLON 04/26/2008  . HEPATITIS, HX OF 04/26/2008  . COLONIC POLYPS, HYPERPLASTIC, HX OF 04/26/2008  . HEMORRHOIDS, INTERNAL 01/11/2008  . DERMATITIS, SEBORRHEIC NEC 01/28/2007  . Hyperlipidemia 01/19/2007  . IBS 01/19/2007  . OVERACTIVE BLADDER 01/19/2007  . POSTMENOPAUSAL STATUS 01/19/2007  . ECZEMA 01/19/2007  . Osteoarthritis 01/19/2007  . ALLERGY 01/19/2007   Past Medical History:  Diagnosis Date  . Allergy   . Bradycardia   . Collagen vascular disease ([place])   . Connective tissue disease ([place])   . DDD (degenerative disc disease), cervical   . DDD (degenerative disc disease), lumbar   . Endometrial polyp  not resolved with D&C (following)  . GERD (gastroesophageal reflux disease)    gas/belching issues-was on protonix for short time-not since 6 months  . HLD (hyperlipidemia)   . Mixed connective tissue disease ([place]) diagnosed 5 years ago   no problems since diagnosis  . Multiple thyroid nodules 01/24/2014  . Obesity   . Osteoarthritis of cervical spine   . PONV (postoperative nausea and vomiting)   . Renal calculi   . Spinal stenosis    Past Surgical History:  Procedure Laterality Date  . APPENDECTOMY    . COLONOSCOPY    . DILATION  AND CURETTAGE OF UTERUS     fibroids  . DILATION AND CURETTAGE OF UTERUS  01/2005   endometrial polyp  . ENDOMETRIAL BIOPSY  2008   fibroid  . HAND SURGERY     carpal tunnel release bilateral Dr. Daylene Katayama  . hysteroscopic resection      polyps of PMB  . TONSILLECTOMY    . TUBAL LIGATION     1973  . UPPER GASTROINTESTINAL ENDOSCOPY     Social History   Tobacco Use  . Smoking status: Former Smoker    Types: Cigarettes    Quit date: 07/22/1965    Years since quitting: 54.9  . Smokeless tobacco: Never Used  Vaping Use  . Vaping Use: Never used  Substance Use Topics  . Alcohol use: No  . Drug use: No   Family History  Problem Relation Age of Onset  . Coronary artery disease Father   . Heart attack Father        deceased at 77  . Dementia Mother   . Diabetes Maternal Grandmother   . Goiter Maternal Grandmother   . Diabetes Cousin   . Cancer Grandchild        rare type (dying at age 25)  . Colon cancer Neg Hx   . Esophageal cancer Neg Hx   . Stomach cancer Neg Hx   . Rectal cancer Neg Hx   . Colon polyps Neg Hx    Allergies  Allergen Reactions  . Codeine Nausea And Vomiting  . Hydrocodone-Acetaminophen     REACTION: reaction not known  . Penicillins Hives   Current Outpatient Medications on File Prior to Visit  Medication Sig Dispense Refill  . clobetasol (OLUX) 0.05 % topical foam Apply topically 2 (two) times daily. 50 g 5  . dexlansoprazole (DEXILANT) 60 MG capsule Take 1 capsule (60 mg total) by mouth daily. 30 capsule 6  . halobetasol (ULTRAVATE) 0.05 % cream Apply topically.    . hydrocortisone valerate cream (WESTCORT) 0.2 % Apply topically.    Marland Kitchen levothyroxine (SYNTHROID, LEVOTHROID) 50 MCG tablet Take 50 mcg 3 times a week (Mon, Wed, Fri),25 mcg 4 times a week (Tues, [place], Sat, Sun)    . metoCLOPramide (REGLAN) 5 MG tablet Take 1 tablet (5 mg total) by mouth 4 (four) times daily -  before meals and at bedtime. 120 tablet 1  . mupirocin ointment (BACTROBAN) 2  % Apply 1 application topically daily. 22 g 0  . triamcinolone cream (KENALOG) 0.1 % Apply topically.    . Wheat Dextrin (BENEFIBER PO) Take 1 tablet by mouth in the morning and at bedtime.     No current facility-administered medications on file prior to visit.    Review of Systems  Constitutional: Negative for activity change, appetite change, fatigue, fever and unexpected weight change.  HENT: Negative for congestion, ear pain, rhinorrhea, sinus pressure and sore throat.  Eyes: Negative for pain, redness and visual disturbance.  Respiratory: Negative for cough, shortness of breath and wheezing.   Cardiovascular: Negative for chest pain and palpitations.  Gastrointestinal: Positive for constipation. Negative for abdominal distention, abdominal pain, anal bleeding, blood in stool, diarrhea, nausea, rectal pain and vomiting.  Endocrine: Negative for polydipsia and polyuria.  Genitourinary: Negative for dysuria, frequency and urgency.  Musculoskeletal: Negative for arthralgias, back pain and myalgias.  Skin: Negative for pallor and rash.  Allergic/Immunologic: Negative for environmental allergies.  Neurological: Negative for dizziness, syncope and headaches.  Hematological: Negative for adenopathy. Does not bruise/bleed easily.  Psychiatric/Behavioral: Negative for decreased concentration and dysphoric mood. The patient is not nervous/anxious.        Objective:   Physical Exam Constitutional:      General: She is not in acute distress.    Appearance: Normal appearance. She is well-developed and well-nourished. She is not ill-appearing or diaphoretic.     Comments: overwt  Well appearing  HENT:     Head: Normocephalic and atraumatic.     Mouth/Throat:     Mouth: Oropharynx is clear and moist.  Eyes:     General: No scleral icterus.    Extraocular Movements: EOM normal.     Conjunctiva/sclera: Conjunctivae normal.     Pupils: Pupils are equal, round, and reactive to light.   Cardiovascular:     Rate and Rhythm: Normal rate and regular rhythm.     Heart sounds: Normal heart sounds.  Pulmonary:     Effort: Pulmonary effort is normal. No respiratory distress.     Breath sounds: Normal breath sounds. No wheezing or rales.  Abdominal:     General: Bowel sounds are normal. There is no distension.     Palpations: Abdomen is soft. There is no mass.     Tenderness: There is no abdominal tenderness. There is no guarding or rebound.     Hernia: No hernia is present.     Comments: No HSM noted   Musculoskeletal:     Cervical back: Normal range of motion and neck supple. No tenderness.     Right lower leg: No edema.     Left lower leg: No edema.  Lymphadenopathy:     Cervical: No cervical adenopathy.  Skin:    General: Skin is warm and dry.     Coloration: Skin is not jaundiced or pale.     Findings: No erythema.  Neurological:     Mental Status: She is alert.  Psychiatric:        Mood and Affect: Mood and affect and mood normal.           Assessment & Plan:   Problem List Items Addressed This Visit      Endocrine   Hypothyroidism    Continues levothyroxine and visits with Dr Donella Stade         Other   Belching    Much improved with dexilant/tx of GERD Continues GI f/u      Current use of proton pump inhibitor    Pt is taking dexilant which has helped greatly for severe GERD symptoms  At next lab draw (for PE) we will add vit B12 level She is concerned about long term effects  inst her to keep taking it      Change in stool caliber    Pt has had more narrow stools- unsure how long Last colonoscopy 9/20 showed tics and hemorrhoids (5 y recall)  Disc poss of hemorrhoids or constipation  causing the problem  Plan made to tx constipation more aggressively with miralax to clean out  Also fiber/fluids(inc fluids to 64 oz per day)  Color change of stool (more yellow)- ? Etiology or significance Will check LFTs at next labs  Pt will also check  in with her GI provider       Other Visit Diagnoses    Need for influenza vaccination    -  Primary   Relevant Orders   Flu Vaccine QUAD High Dose(Fluad) (Completed)

## 2020-07-19 ENCOUNTER — Telehealth: Payer: Self-pay | Admitting: Family Medicine

## 2020-07-19 NOTE — Telephone Encounter (Signed)
Patient is requesting if she has a order for labwork for the following: Vitamin D3 Vitamin b12 Covid Antibodies  Please advise patient EM

## 2020-07-19 NOTE — Telephone Encounter (Signed)
I assume for her upcoming lab appt for her PE in January?  The B12 and D are fine.  I do not routinely check covid ab since we do not have a firm hold on the relationship between antibodies and immunity

## 2020-07-20 NOTE — Telephone Encounter (Signed)
Pt spoke to [facility] and she explained PCP's stand on covid antibodies testing, pt was upset and told her that she's "just going to [facility] and get it done"  FYI to PCP

## 2020-07-20 NOTE — Telephone Encounter (Signed)
That is fine, aware

## 2020-07-27 DIAGNOSIS — Z20828 Contact with and (suspected) exposure to other viral communicable diseases: Secondary | ICD-10-CM | POA: Diagnosis not present

## 2020-07-30 ENCOUNTER — Telehealth: Payer: Self-pay | Admitting: Family Medicine

## 2020-07-30 DIAGNOSIS — Z79899 Other long term (current) drug therapy: Secondary | ICD-10-CM

## 2020-07-30 DIAGNOSIS — E039 Hypothyroidism, unspecified: Secondary | ICD-10-CM

## 2020-07-30 DIAGNOSIS — E559 Vitamin D deficiency, unspecified: Secondary | ICD-10-CM

## 2020-07-30 DIAGNOSIS — Z0000 Encounter for general adult medical examination without abnormal findings: Secondary | ICD-10-CM

## 2020-07-30 DIAGNOSIS — E7800 Pure hypercholesterolemia, unspecified: Secondary | ICD-10-CM

## 2020-07-30 NOTE — Telephone Encounter (Signed)
-----   Message from Cloyd Stagers, RT sent at 07/17/2020  2:08 PM EST ----- Regarding: Lab Orders for Monday 1.10.2022 Please place lab orders for Monday 1.10.2022, office visit for physical on Monday 1.17.2022 Thank you, Dyke Maes RT(R)

## 2020-07-31 ENCOUNTER — Other Ambulatory Visit: Payer: Self-pay

## 2020-07-31 ENCOUNTER — Other Ambulatory Visit (INDEPENDENT_AMBULATORY_CARE_PROVIDER_SITE_OTHER): Payer: PPO

## 2020-07-31 DIAGNOSIS — E039 Hypothyroidism, unspecified: Secondary | ICD-10-CM | POA: Diagnosis not present

## 2020-07-31 DIAGNOSIS — Z79899 Other long term (current) drug therapy: Secondary | ICD-10-CM

## 2020-07-31 DIAGNOSIS — E559 Vitamin D deficiency, unspecified: Secondary | ICD-10-CM

## 2020-07-31 DIAGNOSIS — Z0000 Encounter for general adult medical examination without abnormal findings: Secondary | ICD-10-CM | POA: Diagnosis not present

## 2020-07-31 DIAGNOSIS — E7800 Pure hypercholesterolemia, unspecified: Secondary | ICD-10-CM | POA: Diagnosis not present

## 2020-07-31 LAB — CBC WITH DIFFERENTIAL/PLATELET
Basophils Absolute: 0 10*3/uL (ref 0.0–0.1)
Basophils Relative: 0.5 % (ref 0.0–3.0)
Eosinophils Absolute: 0.1 10*3/uL (ref 0.0–0.7)
Eosinophils Relative: 2.5 % (ref 0.0–5.0)
HCT: 39 % (ref 36.0–46.0)
Hemoglobin: 13 g/dL (ref 12.0–15.0)
Lymphocytes Relative: 23.9 % (ref 12.0–46.0)
Lymphs Abs: 1 10*3/uL (ref 0.7–4.0)
MCHC: 33.4 g/dL (ref 30.0–36.0)
MCV: 89.1 fl (ref 78.0–100.0)
Monocytes Absolute: 0.6 10*3/uL (ref 0.1–1.0)
Monocytes Relative: 13.7 % — ABNORMAL HIGH (ref 3.0–12.0)
Neutro Abs: 2.6 10*3/uL (ref 1.4–7.7)
Neutrophils Relative %: 59.4 % (ref 43.0–77.0)
Platelets: 216 10*3/uL (ref 150.0–400.0)
RBC: 4.38 Mil/uL (ref 3.87–5.11)
RDW: 14 % (ref 11.5–15.5)
WBC: 4.3 10*3/uL (ref 4.0–10.5)

## 2020-07-31 LAB — COMPREHENSIVE METABOLIC PANEL
ALT: 12 U/L (ref 0–35)
AST: 17 U/L (ref 0–37)
Albumin: 4.4 g/dL (ref 3.5–5.2)
Alkaline Phosphatase: 110 U/L (ref 39–117)
BUN: 13 mg/dL (ref 6–23)
CO2: 32 mEq/L (ref 19–32)
Calcium: 9.5 mg/dL (ref 8.4–10.5)
Chloride: 105 mEq/L (ref 96–112)
Creatinine, Ser: 0.73 mg/dL (ref 0.40–1.20)
GFR: 81.27 mL/min (ref >60.00)
Glucose, Bld: 82 mg/dL (ref 70–99)
Potassium: 4.1 mEq/L (ref 3.5–5.1)
Sodium: 140 mEq/L (ref 135–145)
Total Bilirubin: 0.6 mg/dL (ref 0.2–1.2)
Total Protein: 6.2 g/dL (ref 6.0–8.3)

## 2020-07-31 LAB — LIPID PANEL
Cholesterol: 205 mg/dL — ABNORMAL HIGH (ref 0–200)
HDL: 50.9 mg/dL (ref >39.00)
LDL Cholesterol: 132 mg/dL — ABNORMAL HIGH (ref 0–99)
NonHDL: 154.35
Total CHOL/HDL Ratio: 4
Triglycerides: 112 mg/dL (ref 0.0–149.0)
VLDL: 22.4 mg/dL (ref 0.0–40.0)

## 2020-07-31 LAB — VITAMIN D 25 HYDROXY (VIT D DEFICIENCY, FRACTURES): VITD: 69.21 ng/mL (ref 30.00–100.00)

## 2020-07-31 LAB — TSH: TSH: 0.86 u[IU]/mL (ref 0.35–4.50)

## 2020-07-31 LAB — VITAMIN B12: Vitamin B-12: 294 pg/mL (ref 211–911)

## 2020-08-02 ENCOUNTER — Other Ambulatory Visit: Payer: Self-pay

## 2020-08-02 ENCOUNTER — Ambulatory Visit: Payer: PPO | Admitting: Dermatology

## 2020-08-02 DIAGNOSIS — L219 Seborrheic dermatitis, unspecified: Secondary | ICD-10-CM | POA: Diagnosis not present

## 2020-08-02 DIAGNOSIS — T148XXA Other injury of unspecified body region, initial encounter: Secondary | ICD-10-CM | POA: Diagnosis not present

## 2020-08-02 DIAGNOSIS — L578 Other skin changes due to chronic exposure to nonionizing radiation: Secondary | ICD-10-CM

## 2020-08-02 DIAGNOSIS — L821 Other seborrheic keratosis: Secondary | ICD-10-CM | POA: Diagnosis not present

## 2020-08-02 DIAGNOSIS — D485 Neoplasm of uncertain behavior of skin: Secondary | ICD-10-CM

## 2020-08-02 DIAGNOSIS — L570 Actinic keratosis: Secondary | ICD-10-CM | POA: Diagnosis not present

## 2020-08-02 DIAGNOSIS — B078 Other viral warts: Secondary | ICD-10-CM

## 2020-08-02 MED ORDER — KETOCONAZOLE 2 % EX SHAM: MEDICATED_SHAMPOO | CUTANEOUS | 3 refills | Status: DC

## 2020-08-02 NOTE — Progress Notes (Signed)
Follow-Up Visit   Subjective  Morgan [name] is a 75 y.o. female who presents for the following: Actinic Keratosis (Recheck for any persistent skin lesions on the L antecubital fossa and mid forehead), ISK recheck (For any persistent lesions on the L forearm, and L upper arm ), Warts (Recheck for any persistent skin lesions on the R upper arm - still present per patient, and L pretibia. ), and Lesion (R upper eyelid - has been present for years, scaly, and irregular. ).  She reports her scalp is itchy and scaly repeatedly.   The following portions of the chart were reviewed this encounter and updated as appropriate:   Tobacco  Allergies  Meds  Problems  Med Hx  Surg Hx  Fam Hx     Review of Systems:  No other skin or systemic complaints except as noted in HPI or Assessment and Plan.  Objective  Well appearing patient in no apparent distress; mood and affect are within normal limits.  A focused examination was performed including the face, scalp, arms, and legs. Relevant physical exam findings are noted in the Assessment and Plan.  Objective  R upper arm: 0.5 cm scaly pink papule   Objective  R upper eyelid, mid forehead: Erythematous thin papules/macules with gritty scale.   Objective  Scalp: Pink patches with greasy scale.   Objective  L pretibia: Excoriation  Assessment & Plan  Neoplasm of uncertain behavior of skin R upper arm  Skin / nail biopsy Type of biopsy: tangential   Informed consent: discussed and consent obtained   Timeout: patient name, date of birth, surgical site, and procedure verified   Procedure prep:  Patient was prepped and draped in usual sterile fashion Prep type:  Isopropyl alcohol Anesthesia: the lesion was anesthetized in a standard fashion   Anesthetic:  1% lidocaine w/ epinephrine 1-100,000 buffered w/ 8.4% NaHCO3 Instrument used: flexible razor blade   Hemostasis achieved with: pressure, aluminum chloride and electrodesiccation    Outcome: patient tolerated procedure well   Post-procedure details: sterile dressing applied and wound care instructions given   Dressing type: bandage and petrolatum    Specimen 1 - Surgical pathology Differential Diagnosis: D48.5 r/o SCC vs verruca vs ISK  Check Margins: No 0.5 cm scaly pink papule   AK (actinic keratosis) R upper eyelid, mid forehead  Discussed topical chemotherapeutic cream vs LN2 to the R upper eyelid - recommend LN2 due to location.  Destruction of lesion - R upper eyelid, mid forehead Complexity: simple   Destruction method: cryotherapy   Informed consent: discussed and consent obtained   Timeout:  patient name, date of birth, surgical site, and procedure verified Lesion destroyed using liquid nitrogen: Yes   Region frozen until ice ball extended beyond lesion: Yes   Outcome: patient tolerated procedure well with no complications   Post-procedure details: wound care instructions given    Seborrheic dermatitis Scalp  Chronic condition with expected duration over one year. Condition is bothersome to patient. Not currently at goal as patient reports it flares frequently.  Vs psoriasis -   Continue Clobetasol foam to aa's QD PRN flares.   Start Ketoconazole 2% shampoo let sit 5-10 minutes before washing out. Use 2-3 days per week. Ok to use TMC 0.1% cream to aa's QD to the ears. Avoid applying to face, groin, and axilla. Use as directed. Risk of skin atrophy with long-term use reviewed.   Topical steroids (such as triamcinolone, fluocinolone, fluocinonide, mometasone, clobetasol, halobetasol, betamethasone, hydrocortisone) can  cause thinning and lightening of the skin if they are used for too long in the same area. Your physician has selected the right strength medicine for your problem and area affected on the body. Please use your medication only as directed by your physician to prevent side effects.    ketoconazole (NIZORAL) 2 % shampoo - Scalp  Other  Related Medications clobetasol (OLUX) 0.05 % topical foam  Excoriation L pretibia  Likely due to trauma - benign appearing, recommend covering with Vaseline and a band-aid until healed. Call if not resolved.  Seborrheic Keratoses - Stuck-on, waxy, tan-brown papules and plaques  - Discussed benign etiology and prognosis. - Observe - Call for any changes  Actinic Damage - chronic, secondary to cumulative UV radiation exposure/sun exposure over time - diffuse scaly erythematous macules with underlying dyspigmentation - Recommend daily broad spectrum sunscreen SPF 30+ to sun-exposed areas, reapply every 2 hours as needed.  - Call for new or changing lesions.  Return in about 2 months (around 09/30/2020).  Luther Redo, CMA, am acting as scribe for Forest Gleason, MD .  Documentation: I have reviewed the above documentation for accuracy and completeness, and I agree with the above.  Forest Gleason, MD

## 2020-08-02 NOTE — Patient Instructions (Signed)

## 2020-08-07 ENCOUNTER — Encounter: Payer: PPO | Admitting: Family Medicine

## 2020-08-10 ENCOUNTER — Encounter: Payer: Self-pay | Admitting: Nurse Practitioner

## 2020-08-10 ENCOUNTER — Ambulatory Visit: Payer: PPO | Admitting: Nurse Practitioner

## 2020-08-10 ENCOUNTER — Other Ambulatory Visit: Payer: Self-pay

## 2020-08-10 VITALS — BP 126/70 | HR 67 | Ht 65.0 in | Wt 164.0 lb

## 2020-08-10 DIAGNOSIS — R194 Change in bowel habit: Secondary | ICD-10-CM | POA: Diagnosis not present

## 2020-08-10 DIAGNOSIS — R143 Flatulence: Secondary | ICD-10-CM | POA: Diagnosis not present

## 2020-08-10 NOTE — Patient Instructions (Signed)
If you are age 74 or older, your body mass index should be between 23-30. Your Body mass index is 27.29 kg/m. If this is out of the aforementioned range listed, please consider follow up with your Primary Care Provider.  If you are age 48 or younger, your body mass index should be between 19-25. Your Body mass index is 27.29 kg/m. If this is out of the aformentioned range listed, please consider follow up with your Primary Care Provider.   You have been given a testing kit to check for small intestine bacterial overgrowth (SIBO) which is completed by a company named [facility]. Make sure to return your test in the mail using the return mailing label given to you along with the kit. Your demographic and insurance information have already been sent to the company and they should be in contact with you over the next week regarding this test. [facility] will collect an upfront charge of $99.74 for commercial insurance plans and $209.74 is you are paying cash. Make sure to discuss with [facility] PRIOR to having the test if they have gotten informatoin from your insurance company as to how much your testing will cost out of pocket, if any. Please keep in mind that you will be getting a call from phone number [phone] or a similar number. If you do not hear from them within this time frame, please call our office at [phone].   You have been scheduled to follow up with [name], NP for September 13, 2020 at 3:30 pm  Thank you for entrusting me with your care and choosing [facility].  [name], NP-C

## 2020-08-10 NOTE — Progress Notes (Addendum)
ASSESSMENT AND PLAN    # 75 yo female with complaints of a several month history of "massive" gas, also with pencil thin yellow stools. Nothing to explain symptoms on colonoscopy in Sept 2020 and EGD in Aug 2021. She is quite concerned about her symptoms, eager to get answers. Food intolerance? SIBO?  --Will arrange for SIBO testing --She has asked to follow up with me to discuss next step if SIBO testing is negative. I'll see her in follow up in a few weeks.     HISTORY OF PRESENT ILLNESS     Primary Gastroenterologist : Harl Bowie, MD  Chief Complaint :  Excessive gas  Morgan [name] is a 75 y.o. female with PMH / [place] significant for, not limited to: remote appendectomy, SBO ( ? Adhesions), tubal ligation, appendectomy, chronic constipation, diverticulosis  Patient was previously followed bv Dr. Olevia Perches.  She established care with Dr. Silverio Decamp in 2020 at time of colonoscopy. She was then seen in the [facility] August 2021 for evaluation of chest pain.and underwent EGD with findings of Grade B esophagitis and a small hiatal hernia. She had a follow up with Dr. Silverio Decamp mid November. Her chest pain had resolved but she complained of pencil thin, yellow stools.  Felt to have idiopathic constipation, she was started on BID Benefiber and continued on miralax as needed.   Interval history:  Patient made this appt because she feels there is something wrong with her. She has had "massive" gas for several months.  She cannot say if flatus if particularly malodorous because her sense of smell hasn't fully recovered from [place] infection in September. She has excessive flatus despite what she eats.  Also she remains concerned about the several month history of yellow,  pencil size stools. She took the Benefiber BID for about 6 weeks as we suggested but didn't notice any improvement. She hasn't had Benefiber for 3 weeks nor is she taking Miralax but doesn't feel constipated  Even though stools  are thin she has several in a day.  She used to take Culturelle probiotics and resumed them a month ago but so far hasn't had any improvement in the gas but belching is better. Her weight is down from 175 pounds in Aug 2021 to 164 pounds today. Initially she was trying to lose and did lose a few pounds. She lost more weight with COVID19. Weight stable over las couple of months.   Patient inquires about further testing to find out why she is having these symptoms.   Previous Endoscopic Evaluations / Pertinent Studies:  Colonoscopy April 13, 2019:  -Left-sided diverticulosis and hemorrhoids otherwise unremarkable exam.  Aug 2021 EGD for chest pain  -LA Grade B reflux esophagitis with no bleeding. - Gastroesophageal flap valve classified as Hill Grade III (minimal fold, loose to endoscope, hiatal hernia likely). - Normal stomach. - Normal examined duodenum. - No specimens collected.   Past Medical History:  Diagnosis Date  . Allergy   . Bradycardia   . Collagen vascular disease ([place])   . Connective tissue disease ([place])   . DDD (degenerative disc disease), cervical   . DDD (degenerative disc disease), lumbar   . Endometrial polyp    not resolved with D&C (following)  . GERD (gastroesophageal reflux disease)    gas/belching issues-was on protonix for short time-not since 6 months  . HLD (hyperlipidemia)   . Mixed connective tissue disease ([place]) diagnosed 5 years ago   no problems since diagnosis  .  Multiple thyroid nodules 01/24/2014  . Obesity   . Osteoarthritis of cervical spine   . PONV (postoperative nausea and vomiting)   . Renal calculi   . Spinal stenosis     Current Medications, Allergies, Past Surgical History, Family History and Social History were reviewed in [employer] record.   Current Outpatient Medications  Medication Sig Dispense Refill  . clobetasol (OLUX) 0.05 % topical foam Apply topically 2 (two) times daily. 50 g 5  .  dexlansoprazole (DEXILANT) 60 MG capsule Take 1 capsule (60 mg total) by mouth daily. 30 capsule 6  . halobetasol (ULTRAVATE) 0.05 % cream Apply topically.    . hydrocortisone valerate cream (WESTCORT) 0.2 % Apply topically.    Marland Kitchen ketoconazole (NIZORAL) 2 % shampoo Shampoo into the scalp let sit 5-10 minutes then wash out. Use 3 days per week. 120 mL 3  . levothyroxine (SYNTHROID, LEVOTHROID) 50 MCG tablet Take 50 mcg 3 times a week (Mon, Wed, Fri),25 mcg 4 times a week (Tues, [place], Sat, Sun)    . metoCLOPramide (REGLAN) 5 MG tablet Take 1 tablet (5 mg total) by mouth 4 (four) times daily -  before meals and at bedtime. 120 tablet 1  . mupirocin ointment (BACTROBAN) 2 % Apply 1 application topically daily. 22 g 0  . triamcinolone cream (KENALOG) 0.1 % Apply topically.    . Wheat Dextrin (BENEFIBER PO) Take 1 tablet by mouth in the morning and at bedtime.     No current facility-administered medications for this visit.    Review of Systems: No chest pain. No shortness of breath. No urinary complaints.   PHYSICAL EXAM :    Wt Readings from Last 3 Encounters:  07/04/20 164 lb 4 oz (74.5 kg)  06/02/20 166 lb (75.3 kg)  04/12/20 169 lb 8 oz (76.9 kg)    LMP 07/22/1989 (Approximate)  Constitutional:  Pleasant female in no acute distress. Psychiatric: Normal mood and affect. Behavior is normal. EENT: Pupils normal.  Conjunctivae are normal. No scleral icterus. Neck supple.  Cardiovascular: Normal rate, regular rhythm. No edema Pulmonary/chest: Effort normal and breath sounds normal. No wheezing, rales or rhonchi. Abdominal: Soft, nondistended, nontender. Bowel sounds active throughout. There are no masses palpable. No hepatomegaly. Neurological: Alert and oriented to person place and time. Skin: Skin is warm and dry. No rashes noted.  [name], NP  08/10/2020, 2:08 PM

## 2020-08-11 ENCOUNTER — Encounter: Payer: PPO | Admitting: Family Medicine

## 2020-08-16 ENCOUNTER — Encounter: Payer: Self-pay | Admitting: Family Medicine

## 2020-08-16 ENCOUNTER — Other Ambulatory Visit: Payer: Self-pay

## 2020-08-16 ENCOUNTER — Encounter: Payer: Self-pay | Admitting: Dermatology

## 2020-08-16 ENCOUNTER — Ambulatory Visit (INDEPENDENT_AMBULATORY_CARE_PROVIDER_SITE_OTHER): Payer: PPO | Admitting: Family Medicine

## 2020-08-16 VITALS — BP 110/68 | HR 60 | Temp 98.0°F | Ht 64.3 in | Wt 162.0 lb

## 2020-08-16 DIAGNOSIS — E7800 Pure hypercholesterolemia, unspecified: Secondary | ICD-10-CM

## 2020-08-16 DIAGNOSIS — R194 Change in bowel habit: Secondary | ICD-10-CM | POA: Diagnosis not present

## 2020-08-16 DIAGNOSIS — E039 Hypothyroidism, unspecified: Secondary | ICD-10-CM | POA: Diagnosis not present

## 2020-08-16 DIAGNOSIS — Z6830 Body mass index (BMI) 30.0-30.9, adult: Secondary | ICD-10-CM

## 2020-08-16 DIAGNOSIS — Z79899 Other long term (current) drug therapy: Secondary | ICD-10-CM

## 2020-08-16 DIAGNOSIS — E6609 Other obesity due to excess calories: Secondary | ICD-10-CM | POA: Diagnosis not present

## 2020-08-16 DIAGNOSIS — R142 Eructation: Secondary | ICD-10-CM | POA: Diagnosis not present

## 2020-08-16 DIAGNOSIS — R143 Flatulence: Secondary | ICD-10-CM | POA: Diagnosis not present

## 2020-08-16 DIAGNOSIS — E559 Vitamin D deficiency, unspecified: Secondary | ICD-10-CM | POA: Diagnosis not present

## 2020-08-16 DIAGNOSIS — Z0000 Encounter for general adult medical examination without abnormal findings: Secondary | ICD-10-CM | POA: Diagnosis not present

## 2020-08-16 DIAGNOSIS — K219 Gastro-esophageal reflux disease without esophagitis: Secondary | ICD-10-CM

## 2020-08-16 NOTE — Patient Instructions (Addendum)
Get vitamin b12 500 mcg daily  Also eat a balanced diet also   Continue vit D  Take care of yourself   Labs are stable

## 2020-08-16 NOTE — Assessment & Plan Note (Signed)
Reviewed health habits including diet and exercise and skin cancer prevention Reviewed appropriate screening tests for age  Also reviewed health mt list, fam hx and immunization status , as well as social and family history   See HPI Labs reviewed  Cancer screen utd  utd vaccines except covid (declines this or discussion) dexa utd -nl with no falls or fractures  Adv directive utd  No cognitive concerns -cares for husband with dementia Nl hearing screen  utd vision/eye care

## 2020-08-16 NOTE — Assessment & Plan Note (Signed)
Hypothyroidism  Pt has no clinical changes No change in energy level/  skin/ edema and no tremor Lab Results  Component Value Date   TSH 0.86 07/31/2020    Pt c/o hair loss Will see Dr Ronnald Collum

## 2020-08-16 NOTE — Assessment & Plan Note (Signed)
Reviewed health habits including diet and exercise and skin cancer prevention Reviewed appropriate screening tests for age  Also reviewed health mt list, fam hx and immunization status , as well as social and family history   See HPI Labs reviewed  Cancer screen utd  utd vaccines except covid (declines this or discussion) dexa utd -nl with no falls or fractures  Adv directive utd  No cognitive concerns -cares for husband with dementia Nl hearing screen  utd vision/eye care   

## 2020-08-16 NOTE — Assessment & Plan Note (Signed)
Good level of 69  Vitamin D level is therapeutic with current supplementation Disc importance of this to bone and overall health

## 2020-08-16 NOTE — Assessment & Plan Note (Signed)
dexilant  Under GI care

## 2020-08-16 NOTE — Progress Notes (Signed)
Subjective:    Patient ID: Morgan [name], female    DOB: [birth year], 75 y.o.   MRN: [id number]   This visit occurred during the SARS-CoV-2 public health emergency.  Safety protocols were in place, including screening questions prior to the visit, additional usage of staff PPE, and extensive cleaning of exam room while observing appropriate contact time as indicated for disinfecting solutions.    HPI  Pt presents for amw and health mt exam   I have personally reviewed the Medicare Annual Wellness questionnaire and have noted 1. The patient's medical and social history 2. Their use of alcohol, tobacco or illicit drugs 3. Their current medications and supplements 4. The patient's functional ability including ADL's, fall risks, home safety risks and hearing or visual             impairment. 5. Diet and physical activities 6. Evidence for depression or mood disorders  The patients weight, height, BMI have been recorded in the chart and visual acuity is per eye clinic.  I have made referrals, counseling and provided education to the patient based review of the above and I have provided the pt with a written personalized care plan for preventive services. Reviewed and updated provider list, see scanned forms.  See scanned forms.  Routine anticipatory guidance given to patient.  See health maintenance. Colon cancer screening  Colonoscopy 9/20 with 5 y recall  Breast cancer screening 10/21 Self breast exam-no lumps  Flu vaccine 12/21 Tetanus vaccine  11/18 Tdap covid status- no vaccines /not considering  Pneumovax completed Zoster vaccine-had shingrix vaccine  Dexa 10/21- bmd is in the normal range  Falls-none Fractures-none Supplements -takes vit D3  Good D level of 69.2 Exercise - active but no exercise program  (walks all day long)   Advance directive-utd  Cognitive function addressed- see scanned forms- and if abnormal then additional documentation follows.   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   ROS: See HPI.  Otherwise negative.    Weight : Wt Readings from Last 3 Encounters:  08/16/20 162 lb (73.5 kg)  08/10/20 164 lb (74.4 kg)  07/04/20 164 lb 4 oz (74.5 kg)   27.55 kg/m  Diet is fair  Not eating as much as usual due to gas problems    Hearing/vision:  Hearing Screening   125Hz  250Hz  500Hz  1000Hz  2000Hz  3000Hz  4000Hz  6000Hz  8000Hz   Right ear:   20 20 20  20     Left ear:   20 20 20  20      Last eye exam -October  No problems    Care team  Juanda Luba-pcp Christopher-cardiol lomax-derm Morayati-enoc Deveswar-rheum [place]- OD   BP Readings from Last 3 Encounters:  08/16/20 110/68  08/10/20 126/70  07/04/20 124/76   Pulse Readings from Last 3 Encounters:  08/16/20 60  08/10/20 67  07/04/20 (!) 56     Hypothyroidism  Pt has no clinical changes No change in energy level/ hair or skin/ edema and no tremor Lab Results  Component Value Date   TSH 0.86 07/31/2020    Levothyroxine 50 mcg daily  She plans to discuss with Dr Martha Clan -would like to get off of it    Hyperlipidemia Lab Results  Component Value Date   CHOL 205 (H) 07/31/2020   CHOL 202 (H) 02/02/2019   CHOL 209 (H) 01/06/2018   Lab Results  Component Value Date   HDL 50.90 07/31/2020   HDL 50.30 02/02/2019   HDL 50.00 01/06/2018   Lab Results  Component Value Date   LDLCALC 132 (H) 07/31/2020   LDLCALC 135 (H) 02/02/2019   LDLCALC 136 (H) 01/06/2018   Lab Results  Component Value Date   TRIG 112.0 07/31/2020   TRIG 86.0 02/02/2019   TRIG 113.0 01/06/2018   Lab Results  Component Value Date   CHOLHDL 4 07/31/2020   CHOLHDL 4 02/02/2019   CHOLHDL 4 01/06/2018   Lab Results  Component Value Date   LDLDIRECT 148.8 10/09/2010   LDLDIRECT 166.4 08/09/2010   LDLDIRECT 144.5 01/29/2008  statin intolerant  LDL is stable  Tries to eat  healthy   Takes Dexilant for GERD/dyspepsia Seeing GI for gas/flatus and trying to r/o small bowel bact overgrowth  Lab Results  Component Value Date   VITAMINB12 294 07/31/2020    D level of 69  Lab Results  Component Value Date   ALT 12 07/31/2020   AST 17 07/31/2020   ALKPHOS 110 07/31/2020   BILITOT 0.6 07/31/2020   Lab Results  Component Value Date   CREATININE 0.73 07/31/2020   BUN 13 07/31/2020   NA 140 07/31/2020   K 4.1 07/31/2020   CL 105 07/31/2020   CO2 32 07/31/2020   Lab Results  Component Value Date   WBC 4.3 07/31/2020   HGB 13.0 07/31/2020   HCT 39.0 07/31/2020   MCV 89.1 07/31/2020   PLT 216.0 07/31/2020    Patient Active Problem List   Diagnosis Date Noted  . Current use of proton pump inhibitor 07/04/2020  . Change in stool caliber 07/04/2020  . Idiopathic small fiber sensory neuropathy 04/12/2020  . Chest tightness 02/25/2020  . Belching 02/09/2020  . Atypical chest pain 02/09/2020  . Vitamin D deficiency 10/05/2019  . Neck pain 03/02/2019  . Colon cancer screening 02/09/2019  . Snoring 08/06/2017  . Nasal congestion 08/06/2017  . Paresthesia of foot, bilateral 08/12/2016  . Encounter for Medicare annual wellness exam 04/13/2014  . Allergic rhinitis 04/13/2014  . Routine general medical examination at a health care facility 04/07/2014  . Multiple thyroid nodules 01/24/2014  . History of small bowel obstruction 01/20/2014  . Low back strain 01/07/2014  . DDD (degenerative disc disease), cervical   . DDD (degenerative disc disease), lumbar   . Spinal stenosis   . Obese   . TINNITUS 06/08/2010  . NONSPECIFIC ABNORMAL TOXICOLOGICAL FINDINGS 05/11/2010  . Hypothyroidism 04/18/2010  . BRADYCARDIA 05/31/2008  . GERD 05/03/2008  . Dyspepsia 05/03/2008  . DIVERTICULOSIS OF COLON 04/26/2008  . HEPATITIS, HX OF 04/26/2008  . COLONIC POLYPS, HYPERPLASTIC, HX OF 04/26/2008  . HEMORRHOIDS, INTERNAL 01/11/2008  . DERMATITIS, SEBORRHEIC NEC  01/28/2007  . Hyperlipidemia 01/19/2007  . IBS 01/19/2007  . OVERACTIVE BLADDER 01/19/2007  . POSTMENOPAUSAL STATUS 01/19/2007  . ECZEMA 01/19/2007  . Osteoarthritis 01/19/2007  . ALLERGY 01/19/2007   Past Medical History:  Diagnosis Date  . Allergy   . Bradycardia   . Collagen vascular disease (HCC)   . Connective tissue disease (HCC)   . DDD (degenerative disc disease), cervical   . DDD (degenerative disc disease), lumbar   . Endometrial polyp    not resolved with D&C (following)  . GERD (gastroesophageal reflux disease)    gas/belching issues-was on protonix for short time-not since 6 months  . HLD (hyperlipidemia)   . Mixed connective tissue disease (HCC) diagnosed 5 years ago  no problems since diagnosis  . Multiple thyroid nodules 01/24/2014  . Obesity   . Osteoarthritis of cervical spine   . PONV (postoperative nausea and vomiting)   . Renal calculi   . Spinal stenosis    Past Surgical History:  Procedure Laterality Date  . APPENDECTOMY    . COLONOSCOPY    . DILATION AND CURETTAGE OF UTERUS     fibroids  . DILATION AND CURETTAGE OF UTERUS  01/2005   endometrial polyp  . ENDOMETRIAL BIOPSY  2008   fibroid  . HAND SURGERY     carpal tunnel release bilateral Dr. Teressa SenterSypher  . hysteroscopic resection      polyps of PMB  . TONSILLECTOMY    . TUBAL LIGATION     1973  . UPPER GASTROINTESTINAL ENDOSCOPY     Social History   Tobacco Use  . Smoking status: Former Smoker    Types: Cigarettes    Quit date: 07/22/1965    Years since quitting: 55.1  . Smokeless tobacco: Never Used  Vaping Use  . Vaping Use: Never used  Substance Use Topics  . Alcohol use: No  . Drug use: No   Family History  Problem Relation Age of Onset  . Coronary artery disease Father   . Heart attack Father        deceased at [id number]  . Dementia Mother   . Diabetes Maternal Grandmother   . Goiter Maternal Grandmother   . Diabetes Cousin   . Cancer Grandchild        rare type (dying at age 74)   . Colon cancer Neg Hx   . Esophageal cancer Neg Hx   . Stomach cancer Neg Hx   . Rectal cancer Neg Hx   . Colon polyps Neg Hx    Allergies  Allergen Reactions  . Codeine Nausea And Vomiting  . Hydrocodone-Acetaminophen     REACTION: reaction not known  . Penicillins Hives   Current Outpatient Medications on File Prior to Visit  Medication Sig Dispense Refill  . clobetasol (OLUX) 0.05 % topical foam Apply topically 2 (two) times daily. 50 g 5  . dexlansoprazole (DEXILANT) 60 MG capsule Take 1 capsule (60 mg total) by mouth daily. 30 capsule 6  . halobetasol (ULTRAVATE) 0.05 % cream Apply topically.    . hydrocortisone valerate cream (WESTCORT) 0.2 % Apply topically.    Marland Kitchen. ketoconazole (NIZORAL) 2 % shampoo Shampoo into the scalp let sit 5-10 minutes then wash out. Use 3 days per week. 120 mL 3  . levothyroxine (SYNTHROID, LEVOTHROID) 50 MCG tablet Take 50 mcg 3 times a week (Mon, Wed, Fri),25 mcg 4 times a week (Tues, [place], Sat, Sun)    . metoCLOPramide (REGLAN) 5 MG tablet Take 1 tablet (5 mg total) by mouth 4 (four) times daily -  before meals and at bedtime. 120 tablet 1  . mupirocin ointment (BACTROBAN) 2 % Apply 1 application topically daily. 22 g 0  . triamcinolone cream (KENALOG) 0.1 % Apply topically.    . Wheat Dextrin (BENEFIBER PO) Take 1 tablet by mouth in the morning and at bedtime.     No current facility-administered medications on file prior to visit.    Review of Systems  Constitutional: Positive for fatigue. Negative for activity change, appetite change, fever and unexpected weight change.  HENT: Negative for congestion, ear pain, rhinorrhea, sinus pressure and sore throat.   Eyes: Negative for pain, redness and visual disturbance.  Respiratory: Negative for cough, shortness of  breath and wheezing.   Cardiovascular: Negative for chest pain and palpitations.  Gastrointestinal: Positive for constipation. Negative for abdominal pain, blood in stool and diarrhea.   Endocrine: Negative for polydipsia and polyuria.  Genitourinary: Negative for dysuria, frequency and urgency.  Musculoskeletal: Negative for arthralgias, back pain and myalgias.  Skin: Negative for pallor and rash.  Allergic/Immunologic: Negative for environmental allergies.  Neurological: Negative for dizziness, syncope and headaches.  Hematological: Negative for adenopathy. Does not bruise/bleed easily.  Psychiatric/Behavioral: Negative for decreased concentration and dysphoric mood. The patient is not nervous/anxious.        Objective:   Physical Exam Constitutional:      General: She is not in acute distress.    Appearance: Normal appearance. She is well-developed and normal weight. She is not ill-appearing or diaphoretic.  HENT:     Head: Normocephalic and atraumatic.     Right Ear: Tympanic membrane, ear canal and external ear normal.     Left Ear: Tympanic membrane, ear canal and external ear normal.     Nose: Nose normal. No congestion.     Mouth/Throat:     Mouth: Mucous membranes are moist.     Pharynx: Oropharynx is clear. No posterior oropharyngeal erythema.  Eyes:     General: No scleral icterus.    Extraocular Movements: Extraocular movements intact.     Conjunctiva/sclera: Conjunctivae normal.     Pupils: Pupils are equal, round, and reactive to light.  Neck:     Thyroid: No thyromegaly.     Vascular: No carotid bruit or JVD.  Cardiovascular:     Rate and Rhythm: Normal rate and regular rhythm.     Pulses: Normal pulses.     Heart sounds: Normal heart sounds. No gallop.   Pulmonary:     Effort: Pulmonary effort is normal. No respiratory distress.     Breath sounds: Normal breath sounds. No wheezing.     Comments: Good air exch Chest:     Chest wall: No tenderness.  Abdominal:     General: Bowel sounds are normal. There is no distension or abdominal bruit.     Palpations: Abdomen is soft. There is no mass.     Tenderness: There is no abdominal tenderness.      Hernia: No hernia is present.  Genitourinary:    Comments: Breast exam: No mass, nodules, thickening, tenderness, bulging, retraction, inflamation, nipple discharge or skin changes noted.  No axillary or clavicular LA.     Musculoskeletal:        General: No tenderness. Normal range of motion.     Cervical back: Normal range of motion and neck supple. No rigidity. No muscular tenderness.     Right lower leg: No edema.     Left lower leg: No edema.     Comments: ni kyphosis   Lymphadenopathy:     Cervical: No cervical adenopathy.  Skin:    General: Skin is warm and dry.     Coloration: Skin is not pale.     Findings: No erythema or rash.     Comments: Solar lentigines diffusely   Neurological:     Mental Status: She is alert. Mental status is at baseline.     Cranial Nerves: No cranial nerve deficit.     Motor: No abnormal muscle tone.     Coordination: Coordination normal.     Gait: Gait normal.     Deep Tendon Reflexes: Reflexes are normal and symmetric. Reflexes normal.  Psychiatric:  Mood and Affect: Mood normal.        Cognition and Memory: Cognition and memory normal.           Assessment & Plan:   Problem List Items Addressed This Visit      Digestive   GERD    dexilant  Under GI care        Endocrine   Hypothyroidism    Hypothyroidism  Pt has no clinical changes No change in energy level/  skin/ edema and no tremor Lab Results  Component Value Date   TSH 0.86 07/31/2020    Pt c/o hair loss Will see Dr Ronnald Collum        Other   Hyperlipidemia   Obese   Routine general medical examination at a health care facility    Reviewed health habits including diet and exercise and skin cancer prevention Reviewed appropriate screening tests for age  Also reviewed health mt list, fam hx and immunization status , as well as social and family history   See HPI Labs reviewed  Cancer screen utd  utd vaccines except covid (declines this or  discussion) dexa utd -nl with no falls or fractures  Adv directive utd  No cognitive concerns -cares for husband with dementia Nl hearing screen  utd vision/eye care        Encounter for Medicare annual wellness exam - Primary    Reviewed health habits including diet and exercise and skin cancer prevention Reviewed appropriate screening tests for age  Also reviewed health mt list, fam hx and immunization status , as well as social and family history   See HPI Labs reviewed  Cancer screen utd  utd vaccines except covid (declines this or discussion) dexa utd -nl with no falls or fractures  Adv directive utd  No cognitive concerns -cares for husband with dementia Nl hearing screen  utd vision/eye care      Vitamin D deficiency    Good level of 69  Vitamin D level is therapeutic with current supplementation Disc importance of this to bone and overall health       Belching    In midst of GI w/u for this and bloating Possible SIBO      Current use of proton pump inhibitor

## 2020-08-16 NOTE — Assessment & Plan Note (Signed)
In midst of GI w/u for this and bloating Possible SIBO

## 2020-08-23 ENCOUNTER — Telehealth: Payer: Self-pay | Admitting: Nurse Practitioner

## 2020-08-23 ENCOUNTER — Encounter: Payer: Self-pay | Admitting: Family Medicine

## 2020-08-23 ENCOUNTER — Telehealth: Payer: Self-pay

## 2020-08-23 NOTE — Telephone Encounter (Signed)
Inbound call from patient wanting to know if we have received results for breath test.  Please advise.

## 2020-08-23 NOTE — Telephone Encounter (Signed)
Left msg for patient to call for bx results, JS 

## 2020-08-23 NOTE — Telephone Encounter (Signed)
Pt informed of results. She states that she would like to have the remainder of the wart on right upper arm treated with Ln2 at her f/u on 10/11/20.

## 2020-08-24 DIAGNOSIS — Q842 Other congenital malformations of hair: Secondary | ICD-10-CM | POA: Diagnosis not present

## 2020-08-24 DIAGNOSIS — D440 Neoplasm of uncertain behavior of thyroid gland: Secondary | ICD-10-CM | POA: Diagnosis not present

## 2020-08-24 DIAGNOSIS — E039 Hypothyroidism, unspecified: Secondary | ICD-10-CM | POA: Diagnosis not present

## 2020-08-24 DIAGNOSIS — N959 Unspecified menopausal and perimenopausal disorder: Secondary | ICD-10-CM | POA: Diagnosis not present

## 2020-08-24 DIAGNOSIS — E6609 Other obesity due to excess calories: Secondary | ICD-10-CM | POA: Diagnosis not present

## 2020-08-24 DIAGNOSIS — E061 Subacute thyroiditis: Secondary | ICD-10-CM | POA: Diagnosis not present

## 2020-08-24 DIAGNOSIS — E042 Nontoxic multinodular goiter: Secondary | ICD-10-CM | POA: Diagnosis not present

## 2020-08-24 NOTE — Telephone Encounter (Signed)
Ok, please let her know that will be no problem. We can freeze the remainder of the wart at her follow-up. Thank you.

## 2020-08-24 NOTE — Telephone Encounter (Signed)
Spoke with provider [name], NP. Results are back and she is reviewing them. Patient advised.

## 2020-08-25 ENCOUNTER — Telehealth: Payer: Self-pay

## 2020-08-25 ENCOUNTER — Other Ambulatory Visit: Payer: Self-pay

## 2020-08-25 ENCOUNTER — Telehealth: Payer: Self-pay | Admitting: Nurse Practitioner

## 2020-08-25 MED ORDER — RIFAXIMIN 550 MG PO TABS: 550.0000 mg | ORAL_TABLET | Freq: Three times a day (TID) | ORAL | 0 refills | Status: DC

## 2020-08-25 MED ORDER — METRONIDAZOLE 250 MG PO TABS: 250.0000 mg | ORAL_TABLET | Freq: Three times a day (TID) | ORAL | 0 refills | Status: DC

## 2020-08-25 NOTE — Telephone Encounter (Signed)
Patient is allergic to PCN. Per [name], NP, change to Flagyl 250 mg TID x 10 days.  New Rx transmitted to the pharmacy. Voicemail left for the pharmacist. Left a detailed message for the patient advising interaction with alcohol, side effects of metallic taste and queasiness.

## 2020-08-25 NOTE — Telephone Encounter (Signed)
[place] calling in reference to [facility], copay is over $900. Pt would like something different that she can afford. Pls call pharmacy at [phone] or send new prescription.

## 2020-08-25 NOTE — Telephone Encounter (Signed)
Patient contacted with the results from the breath test for SIBO from [facility]. Test is positive. Treatment planned is Xifaxan 550 mg PO TID x 14 days per [name], NP Explained the possibility her insurance will not want to cover the cost.  Patient is allergic to PCN. Follow up appointment is scheduled for 09/13/20.

## 2020-09-01 DIAGNOSIS — N959 Unspecified menopausal and perimenopausal disorder: Secondary | ICD-10-CM | POA: Diagnosis not present

## 2020-09-01 DIAGNOSIS — E061 Subacute thyroiditis: Secondary | ICD-10-CM | POA: Diagnosis not present

## 2020-09-01 DIAGNOSIS — E039 Hypothyroidism, unspecified: Secondary | ICD-10-CM | POA: Diagnosis not present

## 2020-09-01 DIAGNOSIS — Q842 Other congenital malformations of hair: Secondary | ICD-10-CM | POA: Diagnosis not present

## 2020-09-01 DIAGNOSIS — E042 Nontoxic multinodular goiter: Secondary | ICD-10-CM | POA: Diagnosis not present

## 2020-09-06 ENCOUNTER — Telehealth: Payer: Self-pay | Admitting: Family Medicine

## 2020-09-06 NOTE — Telephone Encounter (Signed)
Left VM letting pt know Dr.Tower's comments regarding labs

## 2020-09-06 NOTE — Telephone Encounter (Signed)
Pt called in wanted to know about getting her sugar checked when she had her phyiscal, and she wants to have it due to it runs in her family  and if you cant get her you leave a message

## 2020-09-06 NOTE — Telephone Encounter (Signed)
Glucose level was very good at 82 We check it with almost any lab and that is reassuring  Will continue to monitor  Eating low sugar helps reduce risk Try to get most of your carbohydrates from produce (with the exception of white potatoes)  Eat less bread/pasta/rice/snack foods/cereals/sweets and other items from the middle of the grocery store (processed carbs)

## 2020-09-12 ENCOUNTER — Other Ambulatory Visit: Payer: Self-pay

## 2020-09-12 ENCOUNTER — Ambulatory Visit: Payer: PPO | Admitting: Dermatology

## 2020-09-12 DIAGNOSIS — R21 Rash and other nonspecific skin eruption: Secondary | ICD-10-CM | POA: Diagnosis not present

## 2020-09-12 DIAGNOSIS — D692 Other nonthrombocytopenic purpura: Secondary | ICD-10-CM

## 2020-09-12 MED ORDER — VALACYCLOVIR HCL 1 G PO TABS: 1000.0000 mg | ORAL_TABLET | Freq: Three times a day (TID) | ORAL | 0 refills | Status: AC

## 2020-09-12 NOTE — Progress Notes (Signed)
   Follow-Up Visit   Subjective  Morgan [name] is a 75 y.o. female who presents for the following: Rash.   Rash started 2-3 days ago on right chest, then spread to right upper back. Rash is itchy on chest and back, but back has a deep pain, also. She tried ibuprofen for pain, with no improvement. She tried halobetasol propionate cream a few times, but it wasn't helping. She has had the shingles vaccine.   The following portions of the chart were reviewed this encounter and updated as appropriate:       Review of Systems:  No other skin or systemic complaints except as noted in HPI or Assessment and Plan.  Objective  Well appearing patient in no apparent distress; mood and affect are within normal limits.  A focused examination was performed including chest, back. Relevant physical exam findings are noted in the Assessment and Plan.  Objective  Right Upper Back, right upper breast: Few pink excoriated papules on right upper breast, right upper back.  Tender to touch. No vesicles   Assessment & Plan   Purpura - Chronic; persistent and recurrent.  Treatable, but not curable. - Violaceous macule of the left lower neck - Benign - Related to trauma, age, sun damage and/or use of blood thinners, chronic use of topical and/or oral steroids - Observe - Can use OTC arnica containing moisturizer such as Dermend Bruise Formula if desired - Call for worsening or other concerns   Rash and other nonspecific skin eruption Right Upper Back, right upper breast  Possible Shingles.  Start Valtrex 1 gram take 1 po tid dsp #21 0Rf. Continue halobetasol cream qd/bid prn itch. May take ibuprofen 200mg  up to 3 po prn pain. May take antihistamine qhs prn itch.  Patient may use lidocaine patches to help with pain. Pt has.   Discussed viral etiology (herpes zoster) and risk of contagion. Before blisters dry up and heal, shingles rash can cause chickenpox in people who have never had it or have  never been vaccinated against it, if they are exposed to the virus.  Keep area covered.  Avoid contact with pregnant women. Shingles can also cause significant pain or unusual skin sensations (post-herpetic neuralgia) which may persist after rash has resolved.  Shingles rash may leave dyspigmented scars on skin once healed.   valACYclovir (VALTREX) 1000 MG tablet - Right Upper Back, right upper breast  Return as scheduled with Dr Laurence Ferrari.Lindi Adie, CMA, am acting as scribe for Brendolyn Patty, MD .  Documentation: I have reviewed the above documentation for accuracy and completeness, and I agree with the above.  Brendolyn Patty MD

## 2020-09-12 NOTE — Patient Instructions (Signed)
May use Zostrix cream, Voltaren gel, lidocaine gel or patches as needed to help with pain.

## 2020-09-13 ENCOUNTER — Ambulatory Visit: Payer: PPO | Admitting: Nurse Practitioner

## 2020-09-13 ENCOUNTER — Encounter: Payer: Self-pay | Admitting: Nurse Practitioner

## 2020-09-13 ENCOUNTER — Other Ambulatory Visit (INDEPENDENT_AMBULATORY_CARE_PROVIDER_SITE_OTHER): Payer: PPO

## 2020-09-13 VITALS — BP 130/84 | HR 56 | Ht 64.25 in | Wt 161.5 lb

## 2020-09-13 DIAGNOSIS — R634 Abnormal weight loss: Secondary | ICD-10-CM

## 2020-09-13 DIAGNOSIS — R194 Change in bowel habit: Secondary | ICD-10-CM

## 2020-09-13 DIAGNOSIS — R079 Chest pain, unspecified: Secondary | ICD-10-CM

## 2020-09-13 LAB — BASIC METABOLIC PANEL
BUN: 18 mg/dL (ref 6–23)
CO2: 31 mEq/L (ref 19–32)
Calcium: 9.3 mg/dL (ref 8.4–10.5)
Chloride: 106 mEq/L (ref 96–112)
Creatinine, Ser: 0.69 mg/dL (ref 0.40–1.20)
GFR: 85.69 mL/min (ref >60.00)
Glucose, Bld: 75 mg/dL (ref 70–99)
Potassium: 4 mEq/L (ref 3.5–5.1)
Sodium: 141 mEq/L (ref 135–145)

## 2020-09-13 NOTE — Progress Notes (Signed)
ASSESSMENT AND PLAN    # 75 yo female with excessive intestinal gas, positive SIBO study. Symptoms significantly improved after course of Flagyl   # Bowel changes. Patient very concerned about about having yellow colored, pencil thin caliber stools. Etiology of stool caliber change unclear, ? Colon spasm. Mild left diverticulosis on colonoscopy but no associated luminal narrowing on screening colonoscopy in September 2020  # Unintentional weight loss of 14 pounds since August. She is up to date on colon cancer screening. No major findings on EGD in August 2021.  --Patient very concerned about the unexplained weight loss. Will proceed with CT scan or abdomen and pelvis --Follow up with Dr. Silverio Decamp in clinic after CT scan  # Chronic intermittent chest pain. Negative cardiac evaluation. Mild esophagitis on EGD in Aug 2021. Overall better on Dexilant but still with occasional episodes lasting ~ 3 minutes. ? Esophageal spasms.   HISTORY OF PRESENT ILLNESS     Primary Gastroenterologist : Harl Bowie, MD  Chief Complaint : follow up on gas and bowel changes  Morgan [name] is a 75 y.o. female with PMH / [place] significant for remote appendectomy, SBO ( ? Adhesions), tubal ligation, chronic constipation, diverticulosis  Patient was seen 08/10/2020 for evaluation of excessive gas, and pencil thin yellow stools.  She had previously been started on Benefiber but it caused worsening gas.  She then started MiraLAX but the excessive gas did not improve. She also mentioned an 11 pound weight loss since August 2021. SIBO study ordered and returned positive,  see below. Xifaxan was too costly. She has a PCN allergy. She was treated with Flagyl for 10 days.     INTERVAL HISTORY:  She completed course of antibiotics for SIBO and intestinal gas has SIGNIFICANTLY improved.  However, her stools are stools are still yellow and pencil thin caliber  Patient is taking Miralax qt least every other day is  having 1-2 BMs a day on this regimen. She also takes a probiotic, mainly for management of belching. Morgan [name] is very concerned about ongoing unintentional weight loss in absence of nausea and a decent appetite. Based on weights in Epic she has lost 14 pounds since August. Her smell post COVID has returned but still has metallic taste with a few foods but this doesn't diminish her appetite.     Morgan [name] has chronic, intermittent chest pain but episodes are are much less frequent on Dexilant .She has had a cardiac evaluation and EGD  In the last 7 weeks she has had 2 episodes.  The chest pain does not radiate into her neck, back nor arms.  The episodes generally last about 3 minutes.  She has had a cardiac evaluation and EGD for the chest pain.   Data Reviewed: [facility] SIBO Report        Previous Endoscopic Evaluations / Pertinent Studies:  Colonoscopy April 13, 2019:  -Left-sided diverticulosis and hemorrhoids otherwise unremarkable exam.  Aug 2021 EGD for chest pain  -LA Grade B reflux esophagitis with no bleeding. - Gastroesophageal flap valve classified as Hill Grade III (minimal fold, loose to endoscope, hiatal hernia likely). - Normal stomach. - Normal examined duodenum. - No specimens collected.   Past Medical History:  Diagnosis Date  . Allergy   . Bradycardia   . Collagen vascular disease ([place])   . Connective tissue disease ([place])   . COVID-19   . DDD (degenerative disc disease), cervical   . DDD (degenerative disc disease), lumbar   .  Endometrial polyp    not resolved with D&C (following)  . GERD (gastroesophageal reflux disease)    gas/belching issues-was on protonix for short time-not since 6 months  . HLD (hyperlipidemia)   . Mixed connective tissue disease ([place]) diagnosed 5 years ago   no problems since diagnosis  . Multiple thyroid nodules 01/24/2014  . Obesity   . Osteoarthritis of cervical spine   . PONV (postoperative nausea and vomiting)   . Renal  calculi   . Spinal stenosis     Current Medications, Allergies, Past Surgical History, Family History and Social History were reviewed in [employer] record.   Current Outpatient Medications  Medication Sig Dispense Refill  . Cholecalciferol (VITAMIN D3) 25 MCG (1000 UT) CAPS Take 1 capsule by mouth daily.    . clobetasol (OLUX) 0.05 % topical foam Apply topically 2 (two) times daily. 50 g 5  . Cyanocobalamin (VITAMIN B12) 500 MCG TABS Take 1 tablet by mouth daily.    Marland Kitchen dexlansoprazole (DEXILANT) 60 MG capsule Take 1 capsule (60 mg total) by mouth daily. 30 capsule 6  . halobetasol (ULTRAVATE) 0.05 % cream Apply topically.    . hydrocortisone valerate cream (WESTCORT) 0.2 % Apply topically.    Marland Kitchen ketoconazole (NIZORAL) 2 % shampoo Shampoo into the scalp let sit 5-10 minutes then wash out. Use 3 days per week. 120 mL 3  . Lactobacillus Rhamnosus, GG, (CULTURELLE PO) Take 1 tablet by mouth daily.    Marland Kitchen levothyroxine (SYNTHROID, LEVOTHROID) 50 MCG tablet Take 50 mcg 3 times a week (Mon, Wed, Fri),25 mcg 4 times a week (Tues, [place], Sat, Sun)    . metoCLOPramide (REGLAN) 5 MG tablet Take 1 tablet (5 mg total) by mouth 4 (four) times daily -  before meals and at bedtime. 120 tablet 1  . mupirocin ointment (BACTROBAN) 2 % Apply 1 application topically daily. 22 g 0  . polyethylene glycol powder (GLYCOLAX/MIRALAX) 17 GM/SCOOP powder Take 17 g by mouth every other day.    . triamcinolone cream (KENALOG) 0.1 % Apply topically.    . valACYclovir (VALTREX) 1000 MG tablet Take 1 tablet (1,000 mg total) by mouth 3 (three) times daily for 7 days. 21 tablet 0  . Wheat Dextrin (BENEFIBER PO) Take 1 tablet by mouth in the morning and at bedtime.     No current facility-administered medications for this visit.    Review of Systems: No chest pain. No shortness of breath. No urinary complaints.   PHYSICAL EXAM :    Wt Readings from Last 3 Encounters:  09/13/20 161 lb 8 oz (73.3 kg)   08/16/20 162 lb (73.5 kg)  08/10/20 164 lb (74.4 kg)    BP 130/84 (BP Location: Left Arm, Patient Position: Sitting, Cuff Size: Normal)   Pulse (!) 56   Ht 5' 4.25" (1.632 m) Comment: height measured without shoes  Wt 161 lb 8 oz (73.3 kg)   LMP 07/22/1989 (Approximate)   BMI 27.51 kg/m  Constitutional:  Pleasant female in no acute distress. Psychiatric: Normal mood and affect. Behavior is normal. EENT: Pupils normal.  Conjunctivae are normal. No scleral icterus. Neck supple.  Cardiovascular: Normal rate, regular rhythm. No edema Pulmonary/chest: Effort normal and breath sounds normal. No wheezing, rales or rhonchi. Abdominal: Soft, nondistended, nontender. Bowel sounds active throughout. There are no masses palpable. No hepatomegaly. Neurological: Alert and oriented to person place and time. Skin: Skin is warm and dry. No rashes noted.  [name], NP  09/13/2020, 3:35 PM

## 2020-09-13 NOTE — Patient Instructions (Addendum)
If you are age 74 or older, your body mass index should be between 23-30. Your Body mass index is 27.51 kg/m. If this is out of the aforementioned range listed, please consider follow up with your Primary Care Provider.  If you are age 67 or younger, your body mass index should be between 19-25. Your Body mass index is 27.51 kg/m. If this is out of the aformentioned range listed, please consider follow up with your Primary Care Provider.   Your provider has requested that you go to the basement level for lab work before leaving today. Press "B" on the elevator. The lab is located at the first door on the left as you exit the elevator.  You will be contacted by [facility] in the next 2 days to arrange a Ct Abdomen/Pelvis.  The number on your caller ID will be [phone], please answer when they call.  If you have not heard from them in 2 days please call [phone] to schedule.    You have been scheduled to Follow up with Dr. Silverio Decamp on October 18, 2020 at 11:20 am.  Thank you for entrusting me with your care and choosing [facility].  Wallace Going, NP-C

## 2020-09-17 ENCOUNTER — Encounter: Payer: Self-pay | Admitting: Nurse Practitioner

## 2020-09-18 ENCOUNTER — Telehealth: Payer: Self-pay | Admitting: Nurse Practitioner

## 2020-09-18 ENCOUNTER — Telehealth: Payer: Self-pay

## 2020-09-18 NOTE — Telephone Encounter (Signed)
Can she come in for evaluation?  Especially since the rash seems to have progressed?  Would like to confirm that it is shingles.

## 2020-09-18 NOTE — Progress Notes (Signed)
Reviewed and agree with documentation and assessment and plan. K. Veena Aryn Safran , MD   

## 2020-09-18 NOTE — Telephone Encounter (Signed)
Patient called regarding Shingles treatment. She is on day 6 of treatment. She is not improving. Patient states the days are tolerable but the nights are painful and she has a lot of burning. She states everything is on her right side and she has noticed the burning and tingling has started down patients arm, along with rash. Patient states the back is worse. Please advise.

## 2020-09-18 NOTE — Progress Notes (Signed)
Reviewed and agree with documentation and assessment and plan. K. Veena Nandigam , MD   

## 2020-09-18 NOTE — Telephone Encounter (Signed)
Left message on machine to call back  

## 2020-09-18 NOTE — Telephone Encounter (Signed)
Inbound call from patient requesting a call back from a nurse please.  States since finishing Flagyl medication, her symptoms have come back.  Please advise.

## 2020-09-18 NOTE — Telephone Encounter (Signed)
Patient has been scheduled to come in Wednesday. The front has been advised if they have any openings, if they can please tell me so I can get patient in tomorrow.

## 2020-09-20 ENCOUNTER — Other Ambulatory Visit: Payer: Self-pay

## 2020-09-20 ENCOUNTER — Ambulatory Visit: Payer: PPO | Admitting: Dermatology

## 2020-09-20 DIAGNOSIS — B0229 Other postherpetic nervous system involvement: Secondary | ICD-10-CM | POA: Diagnosis not present

## 2020-09-20 DIAGNOSIS — R21 Rash and other nonspecific skin eruption: Secondary | ICD-10-CM | POA: Diagnosis not present

## 2020-09-20 DIAGNOSIS — B009 Herpesviral infection, unspecified: Secondary | ICD-10-CM | POA: Diagnosis not present

## 2020-09-20 MED ORDER — VALACYCLOVIR HCL 1 G PO TABS: ORAL_TABLET | ORAL | 1 refills | Status: DC

## 2020-09-20 NOTE — Progress Notes (Signed)
   Follow-Up Visit   Subjective  Morgan [name] is a 75 y.o. female who presents for the following: Follow-up (Patient here for 1 week follow-up possible shingles. She finished valacyclovir 1 gram tid x 1 week and is using Sarna lotion to affected areas. Rash has spread some on her back and arm, but improving on chest. She still has pain on right upper back and it has spread some under right arm. She is taking ibuprofen prn pain and an antihistamine at night for itch.). Lidocaine patches didn't help.  She has had the Shingles vaccine.   The following portions of the chart were reviewed this encounter and updated as appropriate:       Review of Systems:  No other skin or systemic complaints except as noted in HPI or Assessment and Plan.  Objective  Well appearing patient in no apparent distress; mood and affect are within normal limits.  A focused examination was performed including face, back, chest. Relevant physical exam findings are noted in the Assessment and Plan.  Objective  Right Upper Back: Pink scaly papules on right upper back, right chest, right axilla, right posterior shoulder and arm.  Objective  Lips: Clear today.   Assessment & Plan  Rash Right Upper Back  Shingles with Post Herpetic Neuralgia, continued pain  Restart Valtrex 1 gram take 1 po tid for additional week.  Discussed low-dose Gabapentin to take before bed for pain. Patient defers for now. She will call for prescription if she wants to start.  Continue Sarna lotion prn Continue OTC Ibuprofen and Benadryl qhs Discussed Zostrix Cream prn pain.  Discussed viral etiology (herpes zoster) and risk of contagion. Before blisters dry up and heal, shingles rash can cause chickenpox in people who have never had it or have never been vaccinated against it, if they are exposed to the virus.  Keep area covered.  Avoid contact with pregnant women. Shingles can also cause significant pain or unusual skin sensations  (post-herpetic neuralgia) which may persist after rash has resolved.  Shingles rash may leave dyspigmented scars on skin once healed.   Herpes simplex Lips  History of HSV-1, chronic condition, no outbreaks today.  Valtrex 1 gram take 2 po at first onset of symptoms, then 2 po 12 hours later dsp #30 1Rf.  valACYclovir (VALTREX) 1000 MG tablet - Lips  Return as scheduled with Dr Laurence Ferrari.   IJamesetta Orleans, CMA, am acting as scribe for Brendolyn Patty, MD .  Documentation: I have reviewed the above documentation for accuracy and completeness, and I agree with the above.  Brendolyn Patty MD

## 2020-09-20 NOTE — Patient Instructions (Addendum)
May try OTC Zostrix Cream - Natural Pain Relief.  Valtrex 1 gram - take 1 pill 3 times a day x 1 week for shingles.  Take 2 tablets by mouth at first onset of symptoms of cold sores, then take 2 tablets by mouth 12 hours later.

## 2020-09-21 ENCOUNTER — Ambulatory Visit
Admission: RE | Admit: 2020-09-21 | Discharge: 2020-09-21 | Disposition: A | Discharge status: Home / Self Care | Payer: PPO | Source: Ambulatory Visit | Attending: Nurse Practitioner | Admitting: Nurse Practitioner

## 2020-09-21 DIAGNOSIS — I700 Atherosclerosis of aorta: Secondary | ICD-10-CM | POA: Diagnosis not present

## 2020-09-21 DIAGNOSIS — Z9049 Acquired absence of other specified parts of digestive tract: Secondary | ICD-10-CM | POA: Diagnosis not present

## 2020-09-21 DIAGNOSIS — R634 Abnormal weight loss: Secondary | ICD-10-CM | POA: Diagnosis not present

## 2020-09-21 MED ORDER — IOHEXOL 300 MG/ML  SOLN
100.0000 mL | Freq: Once | INTRAMUSCULAR | Status: AC | PRN
  Administered 2020-09-21: 100 mL via INTRAVENOUS

## 2020-09-22 NOTE — Telephone Encounter (Signed)
Patient calling for the results of the CT.  Reviewed the results of the CT with her.  All questions answered.  She will keep her follow up with Dr. Silverio Decamp on 3/30

## 2020-09-28 ENCOUNTER — Telehealth: Payer: Self-pay | Admitting: Gastroenterology

## 2020-09-28 NOTE — Telephone Encounter (Signed)
Nevin Bloodgood do you want to repeat SIBO tx before she comes in to see Dr. Silverio Decamp on 3/30?

## 2020-09-29 NOTE — Telephone Encounter (Signed)
Patient notified.  She will keep the appointment with Dr. Silverio Decamp on 3/11

## 2020-09-29 NOTE — Telephone Encounter (Signed)
She is likely going to need a different antibiotic regimen since symptoms came back so quickly after flagyl. If I recall, her insurance wouldn't pay for Xifaxan. I think she is allergic to penicillin. Let's wait until she sees Dr. Silverio Decamp. Thanks

## 2020-10-05 ENCOUNTER — Other Ambulatory Visit: Payer: Self-pay | Admitting: Gastroenterology

## 2020-10-11 ENCOUNTER — Other Ambulatory Visit: Payer: Self-pay

## 2020-10-11 ENCOUNTER — Ambulatory Visit (INDEPENDENT_AMBULATORY_CARE_PROVIDER_SITE_OTHER): Payer: PPO | Admitting: Dermatology

## 2020-10-11 DIAGNOSIS — L304 Erythema intertrigo: Secondary | ICD-10-CM | POA: Diagnosis not present

## 2020-10-11 DIAGNOSIS — L219 Seborrheic dermatitis, unspecified: Secondary | ICD-10-CM

## 2020-10-11 DIAGNOSIS — B079 Viral wart, unspecified: Secondary | ICD-10-CM

## 2020-10-11 DIAGNOSIS — B0229 Other postherpetic nervous system involvement: Secondary | ICD-10-CM | POA: Diagnosis not present

## 2020-10-11 DIAGNOSIS — L570 Actinic keratosis: Secondary | ICD-10-CM

## 2020-10-11 MED ORDER — FLUOROURACIL 5 % EX CREA: TOPICAL_CREAM | CUTANEOUS | 1 refills | Status: DC

## 2020-10-11 NOTE — Progress Notes (Signed)
Follow-Up Visit   Subjective  Morgan [name] is a 75 y.o. female who presents for the following: 2 month follow up (Patient is here today for follow up for biopsy on right upper arm. Biopsy was ruled wart per pathology results. Patient states she recently had shingles and still is having some pain in shoulders. ).  She is bothered by itch at scalp. She does not think shampoo helped.   Also has tender spot beneath right breast.   Also has raised painful spot at toe    The following portions of the chart were reviewed this encounter and updated as appropriate:  Tobacco  Allergies  Meds  Problems  Med Hx  Surg Hx  Fam Hx      Objective  Well appearing patient in no apparent distress; mood and affect are within normal limits.  A focused examination was performed including face, right foot, right abdomen, scalp, upper back. Relevant physical exam findings are noted in the Assessment and Plan.  Objective  Right Upper Back: Skin clear  Objective  forehead/temple: Erythematous thin papules/macules with gritty scale.   Objective  Right 2nd Distal Dorsal Toe: Verrucous papule Right 2nd toe   Objective  Right Abdomen (side) - Upper: Pink macerated patch  Objective  Scalp: Widespread scaly pink plaques on occipital scalp  Assessment & Plan  Neuralgia, post-herpetic Right Upper Back  Patient reports still having some pain from having shingles   Discussed po gabapentin. Pt defers today.  Recommend OTC Gold Bond Rapid Relief Anti-Itch cream (pramoxine + menthol) up to 3 times per day to areas that are itchy.   Actinic keratosis forehead/temple  Chronic condition with expected duration over 1 year. Not at goal.  Discussed cryotherapy vs prescription topical therapy. Pt prefers topical therapy.   Start 5-fluorouracil/calcipotriene cream twice a day for 4 days to affected areas including right forehead, mid forehead and right temple. Prescription sent to [place]   Patient provided with handout reviewing treatment course and side effects and advised to call or message [place] on MyChart with any concerns.   Ordered Medications: fluorouracil (EFUDEX) 5 % cream  Verruca Right 2nd Distal Dorsal Toe  Prior to procedure, discussed risks of blister formation, small wound, skin dyspigmentation, or rare scar following cryotherapy.    Destruction of lesion - Right 2nd Distal Dorsal Toe  Destruction method: cryotherapy   Informed consent: discussed and consent obtained   Lesion destroyed using liquid nitrogen: Yes   Cryotherapy cycles:  2 Outcome: patient tolerated procedure well with no complications   Post-procedure details: wound care instructions given    Erythema intertrigo Right Abdomen (side) - Upper  Start Triamcinolone 0.1% cream (pt has at home) apply to affected areas under breast twice daily for up to 5 days. Avoid applying to face, groin, and axilla. Use as directed. Risk of skin atrophy with long-term use reviewed.    Start ketoconazole shampoo apply daily in shower and leave in for 5 minutes before rinsing off  Topical steroids (such as triamcinolone, fluocinolone, fluocinonide, mometasone, clobetasol, halobetasol, betamethasone, hydrocortisone) can cause thinning and lightening of the skin if they are used for too long in the same area. Your physician has selected the right strength medicine for your problem and area affected on the body. Please use your medication only as directed by your physician to prevent side effects.    Seborrheic dermatitis Scalp  Chronic condition with duration over one year. Condition is bothersome to patient. Currently flared.  Start Neutrogena T-Sal - apply three times per week, massage into scalp and leave in for 10 minutes before rinsing out to help with scale  Continue clobetasol foam to scalp, increasing to twice a day as needed to calm inflammation. Avoid applying to face, groin, and axilla.  Use as directed. Risk of skin atrophy with long-term use reviewed.   Use triamcinolone 0.1% cream twice a day as needed to ears for up to 2 weeks.  Topical steroids (such as triamcinolone, fluocinolone, fluocinonide, mometasone, clobetasol, halobetasol, betamethasone, hydrocortisone) can cause thinning and lightening of the skin if they are used for too long in the same area. Your physician has selected the right strength medicine for your problem and area affected on the body. Please use your medication only as directed by your physician to prevent side effects.    Other Related Medications clobetasol (OLUX) 0.05 % topical foam ketoconazole (NIZORAL) 2 % shampoo  Return in about 3 months (around 01/11/2021) for ak followup, seb derm .  I, Ruthell Rummage, CMA, am acting as scribe for Forest Gleason, MD.  Documentation: I have reviewed the above documentation for accuracy and completeness, and I agree with the above.  Forest Gleason, MD

## 2020-10-11 NOTE — Patient Instructions (Addendum)
Recommend OTC Gold Bond Rapid Relief Anti-Itch cream (pramoxine + menthol) up to 3 times per day to areas that are achey at back.    For Scalp, Neutrogena T-Sal shampoo apply three times per week, massage into scalp and leave in for 10 minutes before rinsing out to help with scale  Continue clobetasol foam to scalp, increasing to twice a day as needed to calm inflammation. Avoid applying to face, groin, and axilla. Use as directed. Risk of skin atrophy with long-term use reviewed.   Use triamcinolone 0.1% cream twice a day as needed to ears for up to 2 weeks.    For under breast, Start Triamcinolone 0.1% cream apply to affected areas under breast twice daily for up to 5 days.   Apply Ketoconazole shampoo daily in shower and leave in for 5 minutes before rinsing off   If you have any questions or concerns for your doctor, please call our main line at [phone] and press option 4 to reach your doctor's medical assistant. If no one answers, please leave a voicemail as directed and we will return your call as soon as possible. Messages left after 4 pm will be answered the following business day.   You may also send [place] a message via [place]. We typically respond to MyChart messages within 1-2 business days.  For prescription refills, please ask your pharmacy to contact our office. Our fax number is [phone].  If you have an urgent issue when the clinic is closed that cannot wait until the next business day, you can page your doctor at the number below.    Please note that while we do our best to be available for urgent issues outside of office hours, we are not available 24/7.   If you have an urgent issue and are unable to reach [place], you may choose to seek medical care at your doctor's office, retail clinic, urgent care center, or emergency room.  If you have a medical emergency, please immediately call 911 or go to the emergency department.  Pager Numbers  - Dr. Nehemiah Massed:  [phone]  - Dr. Laurence Ferrari: [phone]  - Dr. Nicole Kindred: [phone]  In the event of inclement weather, please call our main line at [phone] for an update on the status of any delays or closures.  Dermatology Medication Tips: Please keep the boxes that topical medications come in in order to help keep track of the instructions about where and how to use these. Pharmacies typically print the medication instructions only on the boxes and not directly on the medication tubes.   If your medication is too expensive, please contact our office at [phone] option 4 or send [place] a message through [place].   We are unable to tell what your co-pay for medications will be in advance as this is different depending on your insurance coverage. However, we may be able to find a substitute medication at lower cost or fill out paperwork to get insurance to cover a needed medication.   If a prior authorization is required to get your medication covered by your insurance company, please allow [place] 1-2 business days to complete this process.  Drug prices often vary depending on where the prescription is filled and some pharmacies may offer cheaper prices.  The website www.goodrx.com contains coupons for medications through different pharmacies. The prices here do not account for what the cost may be with help from insurance (it may be cheaper with your insurance), but the website can give you the price  if you did not use any insurance.  - You can print the associated coupon and take it with your prescription to the pharmacy.  - You may also stop by our office during regular business hours and pick up a GoodRx coupon card.  - If you need your prescription sent electronically to a different pharmacy, notify our office through [facility] or by phone at [phone] option 4.   Cryotherapy Aftercare  . Wash gently with soap and water everyday.   Marland Kitchen Apply Vaseline and Band-Aid daily until  healed.  Can use blister bandaid to toe

## 2020-10-12 ENCOUNTER — Encounter: Payer: Self-pay | Admitting: Dermatology

## 2020-10-18 ENCOUNTER — Encounter: Payer: Self-pay | Admitting: Gastroenterology

## 2020-10-18 ENCOUNTER — Ambulatory Visit: Payer: PPO | Admitting: Gastroenterology

## 2020-10-18 VITALS — BP 134/66 | HR 70 | Ht 64.25 in | Wt 156.0 lb

## 2020-10-18 DIAGNOSIS — K219 Gastro-esophageal reflux disease without esophagitis: Secondary | ICD-10-CM | POA: Diagnosis not present

## 2020-10-18 DIAGNOSIS — R140 Abdominal distension (gaseous): Secondary | ICD-10-CM | POA: Diagnosis not present

## 2020-10-18 DIAGNOSIS — K224 Dyskinesia of esophagus: Secondary | ICD-10-CM | POA: Diagnosis not present

## 2020-10-18 DIAGNOSIS — K581 Irritable bowel syndrome with constipation: Secondary | ICD-10-CM | POA: Diagnosis not present

## 2020-10-18 MED ORDER — DEXLANSOPRAZOLE 30 MG PO CPDR: 30.0000 mg | DELAYED_RELEASE_CAPSULE | Freq: Every day | ORAL | 3 refills | Status: DC

## 2020-10-18 MED ORDER — SUCRALFATE 1 G PO TABS: 1.0000 g | ORAL_TABLET | Freq: Two times a day (BID) | ORAL | 3 refills | Status: DC

## 2020-10-18 MED ORDER — HYOSCYAMINE SULFATE SL 0.125 MG SL SUBL: 1.0000 | SUBLINGUAL_TABLET | Freq: Every day | SUBLINGUAL | 3 refills | Status: DC

## 2020-10-18 NOTE — Progress Notes (Signed)
Morgan [name]    [id number]    09/07/1945  Primary Care Physician:[name], Morgan Fanny, MD  Referring Physician: Tower, Morgan Fanny, MD [name],  Morgan [name] [id number]   Chief complaint:  GERD, Abdominal bloating  HPI:  75 year old very pleasant female here for follow-up visit for GERD and abdominal bloating  Since she has been taking Dexilant 60 mg daily she is not having severe reflux episodes or chest discomfort.  She had 2 episodes where she had severe spasm and discomfort in the chest with burning sensation, when it comes on she cannot do anything or drink anything.  She is continue to lose weight, last approximately 25 to 30 pounds in the last 6 months.  She had Covid infection in September 2021 and since then she feels her smell and taste is altered and her appetite is also decreased. Abdominal bloating and gas has improved after using antibiotics for SIBO but she continues to have intermittent constipation and excess gas  CT abdomen and pelvis September 22, 2020: Unremarkable with no acute pathology   She is having constipation with pencil thin stool and also has noticed change in color is more bright yellow.  She had an episode of small bowel obstruction few years ago from adhesions from previous surgery of tubal ligation and she is s/p appendectomy She is taking daily stool softener with daily bowel movement.  Denies any rectal bleeding or melena.    EGD March 17, 2020: LA grade B reflux esophagitis, gastroesophageal flap valve [place] grade 3, small hiatal hernia. Colonoscopy April 13, 2019: Left-sided diverticulosis and hemorrhoids otherwise unremarkable exam.  Outpatient Encounter Medications as of 10/18/2020  Medication Sig  . Cholecalciferol (VITAMIN D3) 25 MCG (1000 UT) CAPS Take 1 capsule by mouth daily.  . clobetasol (OLUX) 0.05 % topical foam Apply topically 2 (two) times daily.  . Cyanocobalamin (VITAMIN B12) 500 MCG TABS Take 1 tablet by  mouth daily.  Marland Kitchen DEXILANT 60 MG capsule TAKE 1 CAPSULE BY MOUTH ONCE DAILY  . fluorouracil (EFUDEX) 5 % cream Apply to mid forehead, right forehead and right temple twice a day for 4 days.  . halobetasol (ULTRAVATE) 0.05 % cream Apply topically.  . hydrocortisone valerate cream (WESTCORT) 0.2 % Apply topically.  Marland Kitchen ketoconazole (NIZORAL) 2 % shampoo Shampoo into the scalp let sit 5-10 minutes then wash out. Use 3 days per week.  . Lactobacillus Rhamnosus, GG, (CULTURELLE PO) Take 1 tablet by mouth daily.  Marland Kitchen levothyroxine (SYNTHROID, LEVOTHROID) 50 MCG tablet Take 50 mcg 3 times a week (Mon, Wed, Fri),25 mcg 4 times a week (Tues, [place], Sat, Sun)  . metoCLOPramide (REGLAN) 5 MG tablet Take 1 tablet (5 mg total) by mouth 4 (four) times daily -  before meals and at bedtime.  . mupirocin ointment (BACTROBAN) 2 % Apply 1 application topically daily.  . polyethylene glycol powder (GLYCOLAX/MIRALAX) 17 GM/SCOOP powder Take 17 g by mouth every other day.  . triamcinolone cream (KENALOG) 0.1 % Apply topically.  . valACYclovir (VALTREX) 1000 MG tablet Take 2 tablets by mouth at first onset of symptoms of cold sores, then take 2 tablets by mouth 12 hours later.  . Wheat Dextrin (BENEFIBER PO) Take 1 tablet by mouth in the morning and at bedtime.   No facility-administered encounter medications on file as of 10/18/2020.    Allergies as of 10/18/2020 - Review Complete 10/12/2020  Allergen Reaction Noted  . Codeine Nausea  And Vomiting 01/19/2007  . Hydrocodone-acetaminophen  01/19/2007  . Penicillins Hives 01/19/2007    Past Medical History:  Diagnosis Date  . Allergy   . Bradycardia   . Collagen vascular disease (Morgan [name])   . Connective tissue disease (Morgan [name])   . COVID-19   . DDD (degenerative disc disease), cervical   . DDD (degenerative disc disease), lumbar   . Endometrial polyp    not resolved with D&C (following)  . GERD (gastroesophageal reflux disease)    gas/belching issues-was on protonix for  short time-not since 6 months  . HLD (hyperlipidemia)   . Mixed connective tissue disease (Morgan [name]) diagnosed 5 years ago   no problems since diagnosis  . Multiple thyroid nodules 01/24/2014  . Obesity   . Osteoarthritis of cervical spine   . PONV (postoperative nausea and vomiting)   . Renal calculi   . Spinal stenosis     Past Surgical History:  Procedure Laterality Date  . APPENDECTOMY    . COLONOSCOPY    . DILATION AND CURETTAGE OF UTERUS     fibroids  . DILATION AND CURETTAGE OF UTERUS  01/2005   endometrial polyp  . ENDOMETRIAL BIOPSY  2008   fibroid  . HAND SURGERY     carpal tunnel release bilateral Dr. Daylene Katayama  . hysteroscopic resection      polyps of PMB  . TONSILLECTOMY    . TUBAL LIGATION     1973  . UPPER GASTROINTESTINAL ENDOSCOPY      Family History  Problem Relation Age of Onset  . Coronary artery disease Father   . Heart attack Father        deceased at 70  . Dementia Mother   . Diabetes Maternal Grandmother   . Goiter Maternal Grandmother   . Diabetes Cousin   . Cancer Grandchild        rare type (dying at age 57)  . Colon cancer Neg Hx   . Esophageal cancer Neg Hx   . Stomach cancer Neg Hx   . Rectal cancer Neg Hx   . Colon polyps Neg Hx     Social History   Socioeconomic History  . Marital status: Married    Spouse name: Not on file  . Number of children: 4  . Years of education: HS  . Highest education level: Not on file  Occupational History  . Occupation: Retired   Tobacco Use  . Smoking status: Former Smoker    Types: Cigarettes    Quit date: 07/22/1965    Years since quitting: 55.2  . Smokeless tobacco: Never Used  Vaping Use  . Vaping Use: Never used  Substance and Sexual Activity  . Alcohol use: No  . Drug use: No  . Sexual activity: Yes    Birth control/protection: Surgical    Comment: BTL  Other Topics Concern  . Not on file  Social History Narrative   + caffeine use     Social Determinants of Health   Financial  Resource Strain: Not on file  Food Insecurity: Not on file  Transportation Needs: Not on file  Physical Activity: Not on file  Stress: Not on file  Social Connections: Not on file  Intimate Partner Violence: Not on file      Review of systems: All other review of systems negative except as mentioned in the HPI.   Physical Exam: Vitals:   10/18/20 1126  BP: 134/66  Pulse: 70   Body mass index is 26.57 kg/m. Gen:  No acute distress HEENT:  sclera anicteric Abd:      soft, non-tender; no palpable masses, no distension Ext:    No edema Neuro: alert and oriented x 3 Psych: normal mood and affect  Data Reviewed:  Reviewed labs, radiology imaging, old records and pertinent past GI work up   Assessment and Plan/Recommendations: 75 year old very pleasant female with chronic GERD, esophageal spasms, IBS constipation and abdominal bloating  GERD: We will plan to taper Dexilant to 30 mg daily Use Carafate 1 g twice daily as needed  Esophageal spasms: Use Levsin sublingual daily as needed  IBS constipation: Continue with increased water intake and MiraLAX as needed  History of partial SBO secondary to adhesions, she is s/p appendectomy and tubal ligation.  No evidence of mass lesion or bowel obstruction on recent CT abdomen and pelvis Advised patient to avoid raw vegetables or fruits with high fiber content, flaxseed or Chia seeds  Colorectal cancer screening: She is up-to-date.  She is average risk, last exam in September 2020 No recall colonoscopy  Abdominal bloating and excess gas: Trial of lactose-free diet for 1 to 2 weeks Reintroduce milk products slowly as tolerated after that, use Lactaid pills as needed  Return in 3 months or sooner if needed  This visit required 40 minutes of patient care (this includes precharting, chart review, review of results, face-to-face time used for counseling as well as treatment plan and follow-up. The patient was provided an  opportunity to ask questions and all were answered. The patient agreed with the plan and demonstrated an understanding of the instructions.  Damaris Hippo , MD    CC: [name], Morgan Fanny, MD

## 2020-10-18 NOTE — Patient Instructions (Addendum)
We will send Dexilant 30 mg , Carafte 1 gm and Levsin to your  pharmacy today  Increase water intake and use Miralax as needed  Use lactaid tablets OTC as needed  Follow a lactose free diet for 1-2 weeks   Lactose-Free Diet, Adult If you have lactose intolerance, you are not able to digest lactose. Lactose is a natural sugar found mainly in dairy milk and dairy products. You may need to avoid all foods and beverages that contain lactose. A lactose-free diet can help you do this. Which foods have lactose? Lactose is found in dairy milk and dairy products, such as:  Yogurt.  Cheese.  Butter.  Margarine.  Sour cream.  Cream.  Whipped toppings and nondairy creamers.  Ice cream and other dairy-based desserts. Lactose is also found in foods or products made with dairy milk or milk ingredients. To find out whether a food contains dairy milk or a milk ingredient, look at the ingredients list. Avoid foods with the statement "May contain milk" and foods that contain:  Milk powder.  Whey.  Curd.  Caseinate.  Lactose.  Lactalbumin.  Lactoglobulin. What are alternatives to dairy milk and foods made with milk products?  Lactose-free milk.  Soy milk with added calcium and vitamin D.  Almond milk, coconut milk, rice milk, or other nondairy milk alternatives with added calcium and vitamin D. Note that these are low in protein.  Soy products, such as soy yogurt, soy cheese, soy ice cream, and soy-based sour cream.  Other nut milk products, such as almond yogurt, almond cheese, cashew yogurt, cashew cheese, cashew ice cream, coconut yogurt, and coconut ice cream. What are tips for following this plan?  Do not consume foods, beverages, vitamins, minerals, or medicines containing lactose. Read ingredient lists carefully.  Look for the words "lactose-free" on labels.  Use lactase enzyme drops or tablets as directed by your health care provider.  Use lactose-free milk or a  milk alternative, such as soy milk or almond milk, for drinking and cooking.  Make sure you get enough calcium and vitamin D in your diet. A lactose-free eating plan can be lacking in these important nutrients.  Take calcium and vitamin D supplements as directed by your health care provider. Talk to your health care provider about supplements if you are not able to get enough calcium and vitamin D from food. What foods can I eat? Fruits All fresh, canned, frozen, or dried fruits that are not processed with lactose. Vegetables All fresh, frozen, and canned vegetables without cheese, cream, or butter sauces. Grains Any that are not made with dairy milk or dairy products. Meats and other proteins Any meat, fish, poultry, and other protein sources that are not made with dairy milk or dairy products. Soy cheese and yogurt. Fats and oils Any that are not made with dairy milk or dairy products. Beverages Lactose-free milk. Soy, rice, or almond milk with added calcium and vitamin D. Fruit and vegetable juices. Sweets and desserts Any that are not made with dairy milk or dairy products. Seasonings and condiments Any that are not made with dairy milk or dairy products. Calcium Calcium is found in many foods that contain lactose and is important for bone health. The amount of calcium you need depends on your age:  Adults younger than 50 years: 1,000 mg of calcium a day.  Adults older than 50 years: 1,200 mg of calcium a day. If you are not getting enough calcium, you may get it from other  sources, including:  Orange juice with calcium added. There are 300-350 mg of calcium in 1 cup of orange juice.  Calcium-fortified soy milk. There are 300-400 mg of calcium in 1 cup of calcium-fortified soy milk.  Calcium-fortified rice or almond milk. There are 300 mg of calcium in 1 cup of calcium-fortified rice or almond milk.  Calcium-fortified breakfast cereals. There are 100-1,000 mg of calcium in  calcium-fortified breakfast cereals.  Spinach, cooked. There are 145 mg of calcium in  cup of cooked spinach.  Edamame, cooked. There are 130 mg of calcium in  cup of cooked edamame.  Collard greens, cooked. There are 125 mg of calcium in  cup of cooked collard greens.  Kale, frozen or cooked. There are 90 mg of calcium in  cup of cooked or frozen kale.  Almonds. There are 95 mg of calcium in  cup of almonds.  Broccoli, cooked. There are 60 mg of calcium in 1 cup of cooked broccoli. The items listed above may not be a complete list of recommended foods and beverages. Contact a dietitian for more options.   What foods are not recommended? Fruits None, unless they are made with dairy milk or dairy products. Vegetables None, unless they are made with dairy milk or dairy products. Grains Any grains that are made with dairy milk or dairy products. Meats and other proteins None, unless they are made with dairy milk or dairy products. Dairy All dairy products, including milk, goat's milk, buttermilk, kefir, acidophilus milk, flavored milk, evaporated milk, condensed milk, dulce de [place], eggnog, yogurt, cheese, and cheese spreads. Fats and oils Any that are made with milk or milk products. Margarines and salad dressings that contain milk or cheese. Cream. Half and half. Cream cheese. Sour cream. Chip dips made with sour cream or yogurt. Beverages Hot chocolate. Cocoa with lactose. Instant iced teas. Powdered fruit drinks. Smoothies made with dairy milk or yogurt. Sweets and desserts Any that are made with milk or milk products. Seasonings and condiments Chewing gum that has lactose. Spice blends if they contain lactose. Artificial sweeteners that contain lactose. Nondairy creamers. The items listed above may not be a complete list of foods and beverages to avoid. Contact a dietitian for more information. Summary  If you are lactose intolerant, it means that you have a hard time  digesting lactose, a natural sugar found in milk and milk products.  Following a lactose-free diet can help you manage this condition.  Calcium is important for bone health and is found in many foods that contain lactose. Talk with your health care provider about other sources of calcium. This information is not intended to replace advice given to you by your health care provider. Make sure you discuss any questions you have with your health care provider. Document Revised: 08/05/2017 Document Reviewed: 08/05/2017 Elsevier Patient Education  2021 [place].   Follow up in 3 months  Due to recent changes in healthcare laws, you may see the results of your imaging and laboratory studies on MyChart before your provider has had a chance to review them.  We understand that in some cases there may be results that are confusing or concerning to you. Not all laboratory results come back in the same time frame and the provider may be waiting for multiple results in order to interpret others.  Please give [place] 48 hours in order for your provider to thoroughly review all the results before contacting the office for clarification of your results.  I appreciate the  opportunity to care for you  Thank You   Harl Bowie , MD

## 2020-11-08 NOTE — Progress Notes (Signed)
Office Visit Note  Patient: Morgan [name]             Date of Birth: 1946/07/12           MRN: [id number]             PCP: Morgan Greenspan, MD Referring: Tower, Wynelle Fanny, MD Visit Date: 11/09/2020 Occupation: @GUAROCC @  Subjective:  Joint stiffness, abdominal pain   History of Present Illness: Morgan [name] is a 75 y.o. female history of osteoarthritis and degenerative disc disease.  [name], feet and knees.  She states in July 2021 she started having abdominal pain.  Her cardiology work-up was negative.  She had GI work-up including endoscopy and CT scan of her abdomen.  She states the entire work-up was negative.  She was diagnosed with gastroesophageal reflux and was placed on Dexilant.  She was also diagnosed with SIBO and was treated with antibiotics.  She states she developed COVID in August 2021.  She lost sense of smell and taste for about 2 months and then developed halitosis.  She states the halitosis persist.  She also has pencil sized stool which are pale in color.  She is concerned that something could be wrong with her liver or her pancreas.  She has lost 25 pounds weight.  She denies any joint swelling.  There is no history of oral ulcers, nasal ulcers, malar rash, Raynaud's phenomenon, photosensitivity, or inflammatory arthritis.  Activities of Daily Living:  Patient reports morning stiffness for 10-15  minutes.   Patient Reports nocturnal pain.  Difficulty dressing/grooming: Denies Difficulty climbing stairs: Denies Difficulty getting out of chair: Denies Difficulty using hands for taps, buttons, cutlery, and/or writing: Denies  Review of Systems  Constitutional: Negative for fatigue, night sweats, weight gain and weight loss.  HENT: Positive for loss of smell and loss of taste. Negative for mouth sores, trouble swallowing, trouble swallowing, mouth dryness and nose dryness.   Eyes: Negative for pain, redness,  itching, visual disturbance and dryness.  Respiratory: Negative for cough, shortness of breath and difficulty breathing.   Cardiovascular: Negative for chest pain, palpitations, hypertension, irregular heartbeat and swelling in legs/feet.  Gastrointestinal: Positive for constipation. Negative for blood in stool and diarrhea.       Patient reports abnormal stools   Endocrine: Negative for increased urination.  Genitourinary: Negative for difficulty urinating and vaginal dryness.  Musculoskeletal: Positive for arthralgias, joint pain and morning stiffness. Negative for joint swelling, myalgias, muscle weakness, muscle tenderness and myalgias.  Skin: Negative for color change, rash, hair loss, redness, skin tightness, ulcers and sensitivity to sunlight.  Allergic/Immunologic: Negative for susceptible to infections.  Neurological: Positive for numbness and headaches. Negative for dizziness, memory loss, night sweats and weakness.  Hematological: Negative for bruising/bleeding tendency and swollen glands.  Psychiatric/Behavioral: Negative for depressed mood, confusion and sleep disturbance. The patient is not nervous/anxious.     PMFS History:  Patient Active Problem List   Diagnosis Date Noted  . Current use of proton pump inhibitor 07/04/2020  . Change in stool caliber 07/04/2020  . Idiopathic small fiber sensory neuropathy 04/12/2020  . Chest tightness 02/25/2020  . Belching 02/09/2020  . Vitamin D deficiency 10/05/2019  . Neck pain 03/02/2019  . Colon cancer screening 02/09/2019  . Snoring 08/06/2017  . Nasal congestion 08/06/2017  . Paresthesia of foot, bilateral 08/12/2016  . Encounter for Medicare annual wellness exam 04/13/2014  . Allergic rhinitis 04/13/2014  .  Routine general medical examination at a health care facility 04/07/2014  . Multiple thyroid nodules 01/24/2014  . History of small bowel obstruction 01/20/2014  . DDD (degenerative disc disease), cervical   . DDD  (degenerative disc disease), lumbar   . Spinal stenosis   . Obese   . TINNITUS 06/08/2010  . NONSPECIFIC ABNORMAL TOXICOLOGICAL FINDINGS 05/11/2010  . Hypothyroidism 04/18/2010  . BRADYCARDIA 05/31/2008  . GERD 05/03/2008  . Dyspepsia 05/03/2008  . DIVERTICULOSIS OF COLON 04/26/2008  . HEPATITIS, HX OF 04/26/2008  . COLONIC POLYPS, HYPERPLASTIC, HX OF 04/26/2008  . HEMORRHOIDS, INTERNAL 01/11/2008  . DERMATITIS, SEBORRHEIC NEC 01/28/2007  . Hyperlipidemia 01/19/2007  . IBS 01/19/2007  . OVERACTIVE BLADDER 01/19/2007  . POSTMENOPAUSAL STATUS 01/19/2007  . ECZEMA 01/19/2007  . Osteoarthritis 01/19/2007  . ALLERGY 01/19/2007    Past Medical History:  Diagnosis Date  . Allergy   . Bradycardia   . Collagen vascular disease ([place])   . Connective tissue disease ([place])   . COVID-19   . DDD (degenerative disc disease), cervical   . DDD (degenerative disc disease), lumbar   . Endometrial polyp    not resolved with D&C (following)  . GERD (gastroesophageal reflux disease)    gas/belching issues-was on protonix for short time-not since 6 months  . HLD (hyperlipidemia)   . Mixed connective tissue disease ([place]) diagnosed 5 years ago   no problems since diagnosis  . Multiple thyroid nodules 01/24/2014  . Obesity   . Osteoarthritis of cervical spine   . PONV (postoperative nausea and vomiting)   . Renal calculi   . Spinal stenosis     Family History  Problem Relation Age of Onset  . Coronary artery disease Father   . Heart attack Father        deceased at 13  . Dementia Mother   . Diabetes Maternal Grandmother   . Goiter Maternal Grandmother   . Diabetes Cousin   . Cancer Grandchild        rare type (dying at age 68)  . Colon cancer Neg Hx   . Esophageal cancer Neg Hx   . Stomach cancer Neg Hx   . Rectal cancer Neg Hx   . Colon polyps Neg Hx    Past Surgical History:  Procedure Laterality Date  . APPENDECTOMY    . COLONOSCOPY    . DILATION AND CURETTAGE OF UTERUS      fibroids  . DILATION AND CURETTAGE OF UTERUS  01/2005   endometrial polyp  . ENDOMETRIAL BIOPSY  2008   fibroid  . HAND SURGERY     carpal tunnel release bilateral Dr. Daylene [name]  . hysteroscopic resection      polyps of PMB  . TONSILLECTOMY    . TUBAL LIGATION     1973  . UPPER GASTROINTESTINAL ENDOSCOPY     Social History   Social History Narrative   + caffeine use     Immunization History  Administered Date(s) Administered  . Fluad Quad(high Dose 65+) 04/03/2019, 04/23/2019, 07/04/2020  . Influenza,inj,Quad PF,6+ Mos 04/13/2014, 09/22/2015, 08/12/2016, 08/06/2017  . Pneumococcal Conjugate-13 04/13/2014  . Pneumococcal Polysaccharide-23 09/15/2012  . Td 11/07/1997, 01/28/2007  . Tdap 05/28/2017  . Zoster 09/09/2006  . Zoster Recombinat (Shingrix) 02/13/2018, 07/20/2018     Objective: Vital Signs: BP 123/76 (BP Location: Left Arm, Patient Position: Sitting, Cuff Size: Normal)   Pulse (!) 47   Resp 14   Ht 5' 4.25" (1.632 m)   Wt 157 lb 3.2 oz (71.3  kg)   LMP 07/22/1989 (Approximate)   BMI 26.77 kg/m    Physical Exam Vitals and nursing note reviewed.  Constitutional:      Appearance: She is well-developed.  HENT:     Head: Normocephalic and atraumatic.  Eyes:     Conjunctiva/sclera: Conjunctivae normal.  Cardiovascular:     Rate and Rhythm: Normal rate and regular rhythm.     Heart sounds: Normal heart sounds.  Pulmonary:     Effort: Pulmonary effort is normal.     Breath sounds: Normal breath sounds.  Abdominal:     General: Bowel sounds are normal.     Palpations: Abdomen is soft.  Musculoskeletal:     Cervical back: Normal range of motion.  Lymphadenopathy:     Cervical: No cervical adenopathy.  Skin:    General: Skin is warm and dry.     Capillary Refill: Capillary refill takes less than 2 seconds.  Neurological:     Mental Status: She is alert and oriented to person, place, and time.  Psychiatric:        Behavior: Behavior normal.       Musculoskeletal Exam: C-spine was in good range of motion.  She had some discomfort range of motion lumbar spine.  Shoulder joints, elbow joints, wrist joints with good range of motion.  She had bilateral PIP and DIP thickening with no synovitis.  Hip joints and knee joints with good range of motion.  She had no tenderness over ankles or MTPs.  CDAI Exam: CDAI Score: -- Patient Global: --; Provider Global: -- Swollen: --; Tender: -- Joint Exam 11/09/2020   No joint exam has been documented for this visit   There is currently no information documented on the homunculus. Go to the Rheumatology activity and complete the homunculus joint exam.  Investigation: No additional findings.  Imaging: No results found.  Recent Labs: Lab Results  Component Value Date   WBC 4.3 07/31/2020   HGB 13.0 07/31/2020   PLT 216.0 07/31/2020   NA 141 09/13/2020   K 4.0 09/13/2020   CL 106 09/13/2020   CO2 31 09/13/2020   GLUCOSE 75 09/13/2020   BUN 18 09/13/2020   CREATININE 0.69 09/13/2020   BILITOT 0.6 07/31/2020   ALKPHOS 110 07/31/2020   AST 17 07/31/2020   ALT 12 07/31/2020   PROT 6.2 07/31/2020   ALBUMIN 4.4 07/31/2020   CALCIUM 9.3 09/13/2020   GFRAA 96 03/13/2020    Speciality Comments: No specialty comments available.  Procedures:  No procedures performed Allergies: Codeine, Hydrocodone-acetaminophen, and Penicillins   Assessment / Plan:     Visit Diagnoses: Primary osteoarthritis of both hands - patient has severe osteoarthritis in her hands.  Joint protection muscle strengthening was discussed.  Primary osteoarthritis of both knees-she has some stiffness in her knee joints.  No warmth swelling or effusion was noted.  Primary osteoarthritis of both feet - she has bilateral pes cavus and osteoarthritis in her feet.    DDD (degenerative disc disease), cervical-she has limited range of motion of her cervical spine without much discomfort.  DDD (degenerative disc disease),  lumbar-she has chronic mild discomfort.  Core strengthening was discussed.  History of vitamin D deficiency-she is on vitamin D supplement.  She states that her bone density has been normal.  Positive ANA (antinuclear antibody) - patient had positive ANA in the past and some other antibodies.  She was diagnosed with mixed connective tissue disease and was on DMARDs in the past by Dr. Earnest Conroy.  There is no history of oral ulcers, nasal ulcers, malar rash, photosensitivity, Raynaud's phenomenon, lymphadenopathy or inflammatory arthritis.  I do not see the need for repeat autoimmune work-up.  I advised her to contact me if she develops new symptoms.  Epigastric pain -she has been experiencing epigastric pain.  She had endoscopy recently which was negative.  She was treated for gastritis and also SIBO.  She is concerned that she may have something going on in her pancreas.  Per her request I will obtain labs.  Plan: Amylase, Lipase  Halitosis-she had COVID-19 infection in August.  She states she lost the sense of taste and smell but she has developed halitosis.  Her symptoms are not improving.  She may consider a second opinion.  History of small bowel obstruction-patient states that recently she has been having pencil sized stools which are pale in color.  She had GI work-up which was negative.  Small intestinal bacterial overgrowth (SIBO)-she was treated with antibiotics.  History of gastroesophageal reflux (GERD)-she was treated with Dexilant.  She states epigastric pain has improved but she still have abnormal stools and halitosis.  I advised her to follow-up with the gastroenterologist.  Lichenoid dermatitis - followed up by dermatologist.  Eczema, unspecified type - followed up by dermatologist.  Multiple thyroid nodules  Orders: Orders Placed This Encounter  Procedures  . Amylase  . Lipase   No orders of the defined types were placed in this encounter.    Follow-Up Instructions: Return  if symptoms worsen or fail to improve, for Osteoarthritis.   Bo Merino, MD  Note - This record has been created using Editor, commissioning.  Chart creation errors have been sought, but may not always  have been located. Such creation errors do not reflect on  the standard of medical care.

## 2020-11-09 ENCOUNTER — Encounter: Payer: Self-pay | Admitting: Rheumatology

## 2020-11-09 ENCOUNTER — Ambulatory Visit: Payer: PPO | Admitting: Rheumatology

## 2020-11-09 ENCOUNTER — Other Ambulatory Visit: Payer: Self-pay

## 2020-11-09 VITALS — BP 123/76 | HR 47 | Resp 14 | Ht 64.25 in | Wt 157.2 lb

## 2020-11-09 DIAGNOSIS — M51369 Other intervertebral disc degeneration, lumbar region without mention of lumbar back pain or lower extremity pain: Secondary | ICD-10-CM

## 2020-11-09 DIAGNOSIS — E042 Nontoxic multinodular goiter: Secondary | ICD-10-CM

## 2020-11-09 DIAGNOSIS — M19071 Primary osteoarthritis, right ankle and foot: Secondary | ICD-10-CM

## 2020-11-09 DIAGNOSIS — K6389 Other specified diseases of intestine: Secondary | ICD-10-CM

## 2020-11-09 DIAGNOSIS — R196 Halitosis: Secondary | ICD-10-CM | POA: Diagnosis not present

## 2020-11-09 DIAGNOSIS — R1013 Epigastric pain: Secondary | ICD-10-CM

## 2020-11-09 DIAGNOSIS — M19041 Primary osteoarthritis, right hand: Secondary | ICD-10-CM

## 2020-11-09 DIAGNOSIS — R7689 Other specified abnormal immunological findings in serum: Secondary | ICD-10-CM

## 2020-11-09 DIAGNOSIS — M170 Bilateral primary osteoarthritis of knee: Secondary | ICD-10-CM

## 2020-11-09 DIAGNOSIS — K638219 Small intestinal bacterial overgrowth, unspecified: Secondary | ICD-10-CM

## 2020-11-09 DIAGNOSIS — M5030 Other cervical disc degeneration, unspecified cervical region: Secondary | ICD-10-CM

## 2020-11-09 DIAGNOSIS — M19042 Primary osteoarthritis, left hand: Secondary | ICD-10-CM

## 2020-11-09 DIAGNOSIS — L309 Dermatitis, unspecified: Secondary | ICD-10-CM

## 2020-11-09 DIAGNOSIS — L280 Lichen simplex chronicus: Secondary | ICD-10-CM | POA: Diagnosis not present

## 2020-11-09 DIAGNOSIS — R768 Other specified abnormal immunological findings in serum: Secondary | ICD-10-CM

## 2020-11-09 DIAGNOSIS — Z8719 Personal history of other diseases of the digestive system: Secondary | ICD-10-CM | POA: Diagnosis not present

## 2020-11-09 DIAGNOSIS — Z8639 Personal history of other endocrine, nutritional and metabolic disease: Secondary | ICD-10-CM | POA: Diagnosis not present

## 2020-11-09 DIAGNOSIS — M5136 Other intervertebral disc degeneration, lumbar region: Secondary | ICD-10-CM

## 2020-11-09 DIAGNOSIS — M19072 Primary osteoarthritis, left ankle and foot: Secondary | ICD-10-CM

## 2020-11-10 LAB — LIPASE: Lipase: 16 U/L (ref 7–60)

## 2020-11-10 LAB — AMYLASE: Amylase: 54 U/L (ref 21–101)

## 2020-11-14 ENCOUNTER — Telehealth: Payer: Self-pay | Admitting: Gastroenterology

## 2020-11-14 NOTE — Telephone Encounter (Signed)
Spoke with the patient.  She report new onset of diarrhea which started 4 days ago. She has had a lot of nocturnal diarrhea. Unable to put a number to the times she has these watery stools. Today before Imodium she had already had 3 watery stools. Denies nausea or abdominal pain. Diarrhea is "explosive." Patient wants to be seen. Appointment scheduled.

## 2020-11-14 NOTE — Telephone Encounter (Signed)
Inbound call from patient stating she has been experiencing diarrhea since this weekend.  Informed patient next availability will be 11/28/20 but she is requesting something sooner.  Please advise.

## 2020-11-15 ENCOUNTER — Other Ambulatory Visit: Payer: PPO

## 2020-11-15 ENCOUNTER — Ambulatory Visit: Payer: PPO | Admitting: Gastroenterology

## 2020-11-15 ENCOUNTER — Encounter: Payer: Self-pay | Admitting: Gastroenterology

## 2020-11-15 VITALS — BP 126/60 | HR 60 | Ht 64.25 in | Wt 156.1 lb

## 2020-11-15 DIAGNOSIS — K219 Gastro-esophageal reflux disease without esophagitis: Secondary | ICD-10-CM

## 2020-11-15 DIAGNOSIS — R197 Diarrhea, unspecified: Secondary | ICD-10-CM

## 2020-11-15 DIAGNOSIS — R195 Other fecal abnormalities: Secondary | ICD-10-CM | POA: Diagnosis not present

## 2020-11-15 NOTE — Patient Instructions (Signed)
Your provider has requested that you go to the basement level for lab work before leaving today. Press "B" on the elevator. The lab is located at the first door on the left as you exit the elevator.  Continue Dexilant 30 mg daily  Eat small frequent meals  Increase diet fiber and water intake   Follow up in 3 months  Due to recent changes in healthcare laws, you may see the results of your imaging and laboratory studies on MyChart before your provider has had a chance to review them.  We understand that in some cases there may be results that are confusing or concerning to you. Not all laboratory results come back in the same time frame and the provider may be waiting for multiple results in order to interpret others.  Please give [place] 48 hours in order for your provider to thoroughly review all the results before contacting the office for clarification of your results.   I appreciate the  opportunity to care for you  Thank You   Harl Bowie , MD

## 2020-11-15 NOTE — Progress Notes (Signed)
Morgan [name]    [id number]    1946-06-22  Primary Care Physician:Tower, Wynelle Fanny, MD  Referring Physician: Tower, Wynelle Fanny, MD [place],  [place] [id number]   Chief complaint:  Diarrhea  HPI:  75 year old very pleasant female here for follow-up visit for change in bowel habits and diarrhea symptoms  She developed acute diarrhea last Sunday night. Multiple bowel movements.  But it has resolved after she  took Imodium on Monday.  She has not had any bowel movement since then  Prior to the acute episode of diarrhea she has noticed change in her stool consistency, is thin like pencil, pasty and light yellow color, with a mal odor  She had an episode of small bowel obstruction few years ago from adhesions from previous surgery of tubal ligation and she is s/p appendectomy Denies any rectal bleeding or melena.  She did not tolerated Benefiber, she usually does not eat much fiber in her diet  Abdominal bloating and gas has improved after using antibiotics for SIBO but she continues to have  excess gas  Since she has been taking Dexilant  daily she is not having severe reflux episodes or chest discomfort.    She is continue to lose weight, last approximately 25 to 30 pounds in the last 6 months.  She had Covid infection in September 2021 and since then she feels her smell and taste is altered and her appetite is also decreased.   CT abdomen and pelvis September 22, 2020: Unremarkable with no acute pathology  EGD August27,2021: LA grade B reflux esophagitis, gastroesophageal flap valve Hill grade 3, small hiatal hernia.  Colonoscopy April 13, 2019: Left-sided diverticulosis and hemorrhoids otherwise unremarkable exam.   Outpatient Encounter Medications as of 11/15/2020  Medication Sig  . Cholecalciferol (VITAMIN D3) 25 MCG (1000 UT) CAPS Take 1 capsule by mouth daily.  . clobetasol (OLUX) 0.05 % topical foam Apply topically 2 (two) times daily.   . Cyanocobalamin (VITAMIN B12) 500 MCG TABS Take 1 tablet by mouth daily.  Marland Kitchen Dexlansoprazole (DEXILANT) 30 MG capsule Take 1 capsule (30 mg total) by mouth daily.  . halobetasol (ULTRAVATE) 0.05 % cream Apply topically.  . hydrocortisone valerate cream (WESTCORT) 0.2 % Apply topically.  Marland Kitchen Hyoscyamine Sulfate SL (LEVSIN/SL) 0.125 MG SUBL Place 1 tablet under the tongue daily. As needed  . ketoconazole (NIZORAL) 2 % shampoo Shampoo into the scalp let sit 5-10 minutes then wash out. Use 3 days per week.  . levothyroxine (SYNTHROID, LEVOTHROID) 50 MCG tablet Take 50 mcg 3 times a week (Mon, Wed, Fri),25 mcg 4 times a week (Tues, [place], Sat, Sun)  . mupirocin ointment (BACTROBAN) 2 % Apply 1 application topically daily.  . polyethylene glycol powder (GLYCOLAX/MIRALAX) 17 GM/SCOOP powder Take 17 g by mouth as needed.  . sucralfate (CARAFATE) 1 g tablet Take 1 tablet (1 g total) by mouth 2 (two) times daily. As needed  . triamcinolone cream (KENALOG) 0.1 % Apply topically.  . valACYclovir (VALTREX) 1000 MG tablet Take 2 tablets by mouth at first onset of symptoms of cold sores, then take 2 tablets by mouth 12 hours later.   No facility-administered encounter medications on file as of 11/15/2020.    Allergies as of 11/15/2020 - Review Complete 11/15/2020  Allergen Reaction Noted  . Codeine Nausea And Vomiting 01/19/2007  . Hydrocodone-acetaminophen  01/19/2007  . Penicillins Hives 01/19/2007    Past Medical History:  Diagnosis Date  . Allergy   . Bradycardia   . Collagen vascular disease ([place])   . Connective tissue disease ([place])   . COVID-19   . DDD (degenerative disc disease), cervical   . DDD (degenerative disc disease), lumbar   . Endometrial polyp    not resolved with D&C (following)  . GERD (gastroesophageal reflux disease)    gas/belching issues-was on protonix for short time-not since 6 months  . HLD (hyperlipidemia)   . Mixed connective tissue disease ([place]) diagnosed 5 years ago    no problems since diagnosis  . Multiple thyroid nodules 01/24/2014  . Obesity   . Osteoarthritis of cervical spine   . PONV (postoperative nausea and vomiting)   . Renal calculi   . Spinal stenosis     Past Surgical History:  Procedure Laterality Date  . APPENDECTOMY    . COLONOSCOPY    . DILATION AND CURETTAGE OF UTERUS     fibroids  . DILATION AND CURETTAGE OF UTERUS  01/2005   endometrial polyp  . ENDOMETRIAL BIOPSY  2008   fibroid  . HAND SURGERY     carpal tunnel release bilateral Dr. Daylene Katayama  . hysteroscopic resection      polyps of PMB  . TONSILLECTOMY    . TUBAL LIGATION     1973  . UPPER GASTROINTESTINAL ENDOSCOPY      Family History  Problem Relation Age of Onset  . Coronary artery disease Father   . Heart attack Father        deceased at 15  . Dementia Mother   . Diabetes Maternal Grandmother   . Goiter Maternal Grandmother   . Diabetes Cousin   . Cancer Grandchild        rare type (dying at age 48)  . Colon cancer Neg Hx   . Esophageal cancer Neg Hx   . Stomach cancer Neg Hx   . Rectal cancer Neg Hx   . Colon polyps Neg Hx     Social History   Socioeconomic History  . Marital status: Married    Spouse name: Not on file  . Number of children: 4  . Years of education: HS  . Highest education level: Not on file  Occupational History  . Occupation: Retired   Tobacco Use  . Smoking status: Former Smoker    Types: Cigarettes    Quit date: 07/22/1965    Years since quitting: 55.3  . Smokeless tobacco: Never Used  Vaping Use  . Vaping Use: Never used  Substance and Sexual Activity  . Alcohol use: No  . Drug use: No  . Sexual activity: Yes    Birth control/protection: Surgical    Comment: BTL  Other Topics Concern  . Not on file  Social History Narrative   + caffeine use     Social Determinants of Health   Financial Resource Strain: Not on file  Food Insecurity: Not on file  Transportation Needs: Not on file  Physical Activity: Not on  file  Stress: Not on file  Social Connections: Not on file  Intimate Partner Violence: Not on file      Review of systems: All other review of systems negative except as mentioned in the HPI.   Physical Exam: Vitals:   11/15/20 1459  BP: 126/60  Pulse: 60   Body mass index is 26.59 kg/m. Gen:      No acute distress HEENT:  sclera anicteric Abd:      soft, non-tender; no  palpable masses, no distension Ext:    No edema Neuro: alert and oriented x 3 Psych: normal mood and affect  Data Reviewed:  Reviewed labs, radiology imaging, old records and pertinent past GI work up   Assessment and Plan/Recommendations:  75 year old very pleasant female with chronic GERD, esophageal spasms, IBS constipation and abdominal bloating  GERD: Continue Dexilant 30 mg daily, will plan to taper if her symptoms continue to be stable with no breakthrough heartburn or spasms Continue antireflux measures  IBS constipation, change in bowel habits: Reassured patient that given she had recent colonoscopy with no polyps she does not need repeat colonoscopy for colorectal cancer screening Encouraged her to increase dietary fiber and water intake  Acute diarrhea likely infectious viral gastroenteritis Maintain hydration Check GI pathogen panel if develops recurrent diarrhea  Decreased appetite with altered taste and smell s/p COVID infection.  She may benefit from evaluation at  [facility].  I am not aware of any specific clinic addressing long-haul COVID in [place] area.  Advised patient to check with PMD  Return in 3 months or sooner if needed   The patient was provided an opportunity to ask questions and all were answered. The patient agreed with the plan and demonstrated an understanding of the instructions.  Damaris Hippo , MD    CC: Tower, Wynelle Fanny, MD

## 2020-11-29 ENCOUNTER — Encounter: Payer: Self-pay | Admitting: Gastroenterology

## 2020-12-05 ENCOUNTER — Other Ambulatory Visit: Payer: PPO

## 2020-12-05 DIAGNOSIS — K219 Gastro-esophageal reflux disease without esophagitis: Secondary | ICD-10-CM | POA: Diagnosis not present

## 2020-12-05 DIAGNOSIS — R195 Other fecal abnormalities: Secondary | ICD-10-CM | POA: Diagnosis not present

## 2020-12-05 DIAGNOSIS — R197 Diarrhea, unspecified: Secondary | ICD-10-CM

## 2020-12-06 LAB — CLOSTRIDIUM DIFFICILE BY PCR: Toxigenic C. Difficile by PCR: NEGATIVE

## 2020-12-09 LAB — GI PROFILE, STOOL, PCR

## 2020-12-10 LAB — FECAL LACTOFERRIN, QUANT
Fecal Lactoferrin: NEGATIVE
MICRO NUMBER:: 11900117
SPECIMEN QUALITY:: ADEQUATE

## 2020-12-10 LAB — PANCREATIC ELASTASE, FECAL: Pancreatic Elastase-1, Stool: >500 mcg/g

## 2021-01-11 ENCOUNTER — Ambulatory Visit: Payer: PPO | Admitting: Family Medicine

## 2021-01-17 ENCOUNTER — Ambulatory Visit: Payer: PPO | Admitting: Dermatology

## 2021-02-12 NOTE — Progress Notes (Deleted)
Office Visit Note  Patient: Morgan [name]             Date of Birth: Oct 30, 1945           MRN: [id number]             PCP: Abner Greenspan, MD Referring: Tower, Wynelle Fanny, MD Visit Date: 02/21/2021 Occupation: '@GUAROCC'$ @  Subjective:  No chief complaint on file.   History of Present Illness: Morgan [name] is a 75 y.o. female ***   Activities of Daily Living:  Patient reports morning stiffness for *** {minute/hour:19697}.   Patient {ACTIONS;DENIES/REPORTS:21021675::"Denies"} nocturnal pain.  Difficulty dressing/grooming: {ACTIONS;DENIES/REPORTS:21021675::"Denies"} Difficulty climbing stairs: {ACTIONS;DENIES/REPORTS:21021675::"Denies"} Difficulty getting out of chair: {ACTIONS;DENIES/REPORTS:21021675::"Denies"} Difficulty using hands for taps, buttons, cutlery, and/or writing: {ACTIONS;DENIES/REPORTS:21021675::"Denies"}  No Rheumatology ROS completed.   PMFS History:  Patient Active Problem List   Diagnosis Date Noted  . Current use of proton pump inhibitor 07/04/2020  . Change in stool caliber 07/04/2020  . Idiopathic small fiber sensory neuropathy 04/12/2020  . Chest tightness 02/25/2020  . Belching 02/09/2020  . Vitamin D deficiency 10/05/2019  . Neck pain 03/02/2019  . Colon cancer screening 02/09/2019  . Snoring 08/06/2017  . Nasal congestion 08/06/2017  . Paresthesia of foot, bilateral 08/12/2016  . Encounter for Medicare annual wellness exam 04/13/2014  . Allergic rhinitis 04/13/2014  . Routine general medical examination at a health care facility 04/07/2014  . Multiple thyroid nodules 01/24/2014  . History of small bowel obstruction 01/20/2014  . DDD (degenerative disc disease), cervical   . DDD (degenerative disc disease), lumbar   . Spinal stenosis   . Obese   . TINNITUS 06/08/2010  . NONSPECIFIC ABNORMAL TOXICOLOGICAL FINDINGS 05/11/2010  . Hypothyroidism 04/18/2010  . BRADYCARDIA 05/31/2008  . GERD 05/03/2008  . Dyspepsia 05/03/2008  . DIVERTICULOSIS  OF COLON 04/26/2008  . HEPATITIS, HX OF 04/26/2008  . COLONIC POLYPS, HYPERPLASTIC, HX OF 04/26/2008  . HEMORRHOIDS, INTERNAL 01/11/2008  . DERMATITIS, SEBORRHEIC NEC 01/28/2007  . Hyperlipidemia 01/19/2007  . IBS 01/19/2007  . OVERACTIVE BLADDER 01/19/2007  . POSTMENOPAUSAL STATUS 01/19/2007  . ECZEMA 01/19/2007  . Osteoarthritis 01/19/2007  . ALLERGY 01/19/2007    Past Medical History:  Diagnosis Date  . Allergy   . Bradycardia   . Collagen vascular disease ([place])   . Connective tissue disease ([place])   . COVID-19   . DDD (degenerative disc disease), cervical   . DDD (degenerative disc disease), lumbar   . Endometrial polyp    not resolved with D&C (following)  . GERD (gastroesophageal reflux disease)    gas/belching issues-was on protonix for short time-not since 6 months  . HLD (hyperlipidemia)   . Mixed connective tissue disease ([place]) diagnosed 5 years ago   no problems since diagnosis  . Multiple thyroid nodules 01/24/2014  . Obesity   . Osteoarthritis of cervical spine   . PONV (postoperative nausea and vomiting)   . Renal calculi   . Spinal stenosis     Family History  Problem Relation Age of Onset  . Coronary artery disease Father   . Heart attack Father        deceased at 35  . Dementia Mother   . Diabetes Maternal Grandmother   . Goiter Maternal Grandmother   . Diabetes Cousin   . Cancer Grandchild        rare type (dying at age 3)  . Colon cancer Neg Hx   . Esophageal cancer Neg Hx   . Stomach cancer Neg  Hx   . Rectal cancer Neg Hx   . Colon polyps Neg Hx    Past Surgical History:  Procedure Laterality Date  . APPENDECTOMY    . COLONOSCOPY    . DILATION AND CURETTAGE OF UTERUS     fibroids  . DILATION AND CURETTAGE OF UTERUS  01/2005   endometrial polyp  . ENDOMETRIAL BIOPSY  2008   fibroid  . HAND SURGERY     carpal tunnel release bilateral Dr. Daylene Katayama  . hysteroscopic resection      polyps of PMB  . TONSILLECTOMY    . TUBAL LIGATION      1973  . UPPER GASTROINTESTINAL ENDOSCOPY     Social History   Social History Narrative   + caffeine use     Immunization History  Administered Date(s) Administered  . Fluad Quad(high Dose 65+) 04/03/2019, 04/23/2019, 07/04/2020  . Influenza,inj,Quad PF,6+ Mos 04/13/2014, 09/22/2015, 08/12/2016, 08/06/2017  . Pneumococcal Conjugate-13 04/13/2014  . Pneumococcal Polysaccharide-23 09/15/2012  . Td 11/07/1997, 01/28/2007  . Tdap 05/28/2017  . Zoster Recombinat (Shingrix) 02/13/2018, 07/20/2018  . Zoster, Live 09/09/2006     Objective: Vital Signs: LMP 07/22/1989 (Approximate)    Physical Exam   Musculoskeletal Exam: ***  CDAI Exam: CDAI Score: -- Patient Global: --; Provider Global: -- Swollen: --; Tender: -- Joint Exam 02/21/2021   No joint exam has been documented for this visit   There is currently no information documented on the homunculus. Go to the Rheumatology activity and complete the homunculus joint exam.  Investigation: No additional findings.  Imaging: No results found.  Recent Labs: Lab Results  Component Value Date   WBC 4.3 07/31/2020   HGB 13.0 07/31/2020   PLT 216.0 07/31/2020   NA 141 09/13/2020   K 4.0 09/13/2020   CL 106 09/13/2020   CO2 31 09/13/2020   GLUCOSE 75 09/13/2020   BUN 18 09/13/2020   CREATININE 0.69 09/13/2020   BILITOT 0.6 07/31/2020   ALKPHOS 110 07/31/2020   AST 17 07/31/2020   ALT 12 07/31/2020   PROT 6.2 07/31/2020   ALBUMIN 4.4 07/31/2020   CALCIUM 9.3 09/13/2020   GFRAA 96 03/13/2020    Speciality Comments: No specialty comments available.  Procedures:  No procedures performed Allergies: Codeine, Hydrocodone-acetaminophen, and Penicillins   Assessment / Plan:     Visit Diagnoses: No diagnosis found.  Orders: No orders of the defined types were placed in this encounter.  No orders of the defined types were placed in this encounter.   Face-to-face time spent with patient was *** minutes. Greater than  50% of time was spent in counseling and coordination of care.  Follow-Up Instructions: No follow-ups on file.   Earnestine Mealing, CMA  Note - This record has been created using Editor, commissioning.  Chart creation errors have been sought, but may not always  have been located. Such creation errors do not reflect on  the standard of medical care.

## 2021-02-20 ENCOUNTER — Telehealth (INDEPENDENT_AMBULATORY_CARE_PROVIDER_SITE_OTHER): Payer: PPO | Admitting: Primary Care

## 2021-02-20 ENCOUNTER — Other Ambulatory Visit: Payer: Self-pay

## 2021-02-20 ENCOUNTER — Telehealth: Payer: Self-pay | Admitting: Family Medicine

## 2021-02-20 VITALS — Temp 100.5°F | Ht 64.25 in | Wt 154.0 lb

## 2021-02-20 DIAGNOSIS — U071 COVID-19: Secondary | ICD-10-CM | POA: Insufficient documentation

## 2021-02-20 MED ORDER — NIRMATRELVIR/RITONAVIR (PAXLOVID)TABLET
3.0000 | ORAL_TABLET | Freq: Two times a day (BID) | ORAL | 0 refills | Status: AC
Start: 2021-02-20 — End: 2021-02-25

## 2021-02-20 NOTE — Patient Instructions (Signed)
Start Paxlovid antiviral medication for Covid-19 infection. Take 3 tablets by mouth twice daily for five days.   Stay hydrated well with water.   Return precautions provided.

## 2021-02-20 NOTE — Assessment & Plan Note (Signed)
Symptom onset 2 days ago, positive home test yesterday. She does not have a copy of her home test.   Discussed options for treatment, she qualifies for Paxlovid. GFR is 85 from February 2022. Rx sent to pharmacy. Discussed instructions with patient.  Return precautions provided.

## 2021-02-20 NOTE — Telephone Encounter (Signed)
Morgan [name] called in and stated that she tested positive for covid yesterday and wanted to know if Dr. Glori Bickers is prescribing the covid medication or just the infusion and wanted to know if she could be prescribed the medication.  Denied access nurse and provided her info to the nurses.

## 2021-02-20 NOTE — Telephone Encounter (Signed)
Patient was seen for virtual today.

## 2021-02-20 NOTE — Progress Notes (Signed)
Patient ID: Morgan [name], female    DOB: 1945-09-02, 75 y.o.   MRN: [id number]  Virtual visit completed through [place], a video enabled telemedicine application. Due to national recommendations of social distancing due to COVID-19, a virtual visit is felt to be most appropriate for this patient at this time. Reviewed limitations, risks, security and privacy concerns of performing a virtual visit and the availability of in person appointments. I also reviewed that there may be a patient responsible charge related to this service. The patient agreed to proceed.   We attempted to connect via video but the connection would not last. We conducted the majority of our visit via phone which lasted 6 min and 32 sec.   Patient location: home Provider location: [place] at [facility], office Persons participating in this virtual visit: patient, provider   If any vitals were documented, they were collected by patient at home unless specified below.    Temp (!) 100.5 F (38.1 C) Comment: before meds  Ht 5' 4.25" (1.632 m)   Wt 154 lb (69.9 kg)   LMP 07/22/1989 (Approximate)   BMI 26.23 kg/m    CC: Positive Covid Subjective:   HPI: Morgan [name] is a 75 y.o. female patient of Dr. Glori Bickers with a history of GERD, hypothyroidism, hyperlipidemia, allergic rhinitis presenting on 02/20/2021 for Covid Positive (Home test 02/19/2021. Symptoms started 7/31. )  Symptom onset two days ago with a dry cough. Her symptoms then progressed to joint aches, nasal congestion, fevers, decreased appetite. Her fever yesterday was 100.5. She took a Covid-19 test yesterday which was positive. She's not had any Covid-19 vaccines.   Today she's feeling worse. She's been taking Ibuprofen with some improvement in aches and headaches. History of Covid-19 infection 11 months ago, had MAB infusion and did well.       Relevant past medical, surgical, family and social history reviewed and updated as indicated. Interim  medical history since our last visit reviewed. Allergies and medications reviewed and updated. Outpatient Medications Prior to Visit  Medication Sig Dispense Refill   Cholecalciferol (VITAMIN D3) 25 MCG (1000 UT) CAPS Take 1 capsule by mouth daily.     clobetasol (OLUX) 0.05 % topical foam Apply topically 2 (two) times daily. 50 g 5   Cyanocobalamin (VITAMIN B12) 500 MCG TABS Take 1 tablet by mouth daily.     halobetasol (ULTRAVATE) 0.05 % cream Apply topically.     hydrocortisone valerate cream (WESTCORT) 0.2 % Apply topically.     Hyoscyamine Sulfate SL (LEVSIN/SL) 0.125 MG SUBL Place 1 tablet under the tongue daily. As needed 30 tablet 3   ketoconazole (NIZORAL) 2 % shampoo Shampoo into the scalp let sit 5-10 minutes then wash out. Use 3 days per week. 120 mL 3   levothyroxine (SYNTHROID, LEVOTHROID) 50 MCG tablet Take 50 mcg 3 times a week (Mon, Wed, Fri),25 mcg 4 times a week (Tues, Thurs, Sat, Sun)     mupirocin ointment (BACTROBAN) 2 % Apply 1 application topically daily. 22 g 0   polyethylene glycol powder (GLYCOLAX/MIRALAX) 17 GM/SCOOP powder Take 17 g by mouth as needed.     sucralfate (CARAFATE) 1 g tablet Take 1 tablet (1 g total) by mouth 2 (two) times daily. As needed 60 tablet 3   triamcinolone cream (KENALOG) 0.1 % Apply topically.     valACYclovir (VALTREX) 1000 MG tablet Take 2 tablets by mouth at first onset of symptoms of cold sores, then take 2 tablets by mouth  12 hours later. 30 tablet 1   Dexlansoprazole (DEXILANT) 30 MG capsule Take 1 capsule (30 mg total) by mouth daily. (Patient not taking: Reported on 02/20/2021) 30 capsule 3   No facility-administered medications prior to visit.     Per HPI unless specifically indicated in ROS section below Review of Systems  Constitutional:  Positive for fatigue and fever.  HENT:  Positive for congestion and sore throat.   Respiratory:  Positive for cough. Negative for shortness of breath.   Neurological:  Positive for headaches.   Objective:  Temp (!) 100.5 F (38.1 C) Comment: before meds  Ht 5' 4.25" (1.632 m)   Wt 154 lb (69.9 kg)   LMP 07/22/1989 (Approximate)   BMI 26.23 kg/m   Wt Readings from Last 3 Encounters:  02/20/21 154 lb (69.9 kg)  11/15/20 156 lb 2 oz (70.8 kg)  11/09/20 157 lb 3.2 oz (71.3 kg)       Physical exam: Gen: alert, NAD, not ill appearing Pulm: speaks in complete sentences without increased work of breathing. Dry cough during exam.  Psych: normal mood, normal thought content      Results for orders placed or performed in visit on 12/05/20  Clostridium Difficile by PCR   Specimen: Stool   Stool  Result Value Ref Range   Toxigenic C. Difficile by PCR Negative Negative  Pancreatic Elastase, Fecal  Result Value Ref Range   Pancreatic Elastase-1, Stool >500 mcg/g  Fecal lactoferrin, quant  Result Value Ref Range   MICRO NUMBER: [id number]    SPECIMEN QUALITY: Adequate    Source STOOL    STATUS: FINAL    Fecal Lactoferrin Negative    COMMENT:      Lactoferrin in the stool is a marker for fecal leukocytes and is a non-specific indicator of intestinal inflammation that may be detected in patients with acute infectious colitis or inflammatory bowel disease. The diagnosis of an acute infectious  process or active IBD cannot be established solely on the basis of a positive result. This test may not be appropriate for immunocompromised persons. In addition, this test is not FDA cleared for patients with a history of HIV and/or Hepatitis B and C,  patients with a history of infectious diarrhea (within 6 months), and patients having had a colostomy and/or ileostomy within 1 month.   GI Profile, Stool, PCR  Result Value Ref Range   Campylobacter Not Detected Not Detected   C difficile toxin A/B Not Detected Not Detected   Plesiomonas shigelloides Not Detected Not Detected   Salmonella Not Detected Not Detected   Vibrio Not Detected Not Detected   Vibrio cholerae Not Detected Not  Detected   Yersinia enterocolitica Not Detected Not Detected   Enteroaggregative E coli Not Detected Not Detected   Enteropathogenic E coli Not Detected Not Detected   Enterotoxigenic E coli Not Detected Not Detected   Shiga-toxin-producing E coli Not Detected Not Detected   E coli [id number] Not applicable Not Detected   Shigella/Enteroinvasive E coli Not Detected Not Detected   Cryptosporidium Not Detected Not Detected   Cyclospora cayetanensis Not Detected Not Detected   Entamoeba histolytica Not Detected Not Detected   Giardia lamblia Not Detected Not Detected   Adenovirus F 40/41 Not Detected Not Detected   Astrovirus Not Detected Not Detected   Norovirus GI/GII Not Detected Not Detected   Rotavirus A Not Detected Not Detected   Sapovirus Not Detected Not Detected   Assessment & Plan:   Problem List Items  Addressed This Visit       Other   COVID-19 virus infection - Primary    Symptom onset 2 days ago, positive home test yesterday. She does not have a copy of her home test.   Discussed options for treatment, she qualifies for Paxlovid. GFR is 85 from February 2022. Rx sent to pharmacy. Discussed instructions with patient.  Return precautions provided.        Relevant Medications   nirmatrelvir/ritonavir EUA (PAXLOVID) TABS     Meds ordered this encounter  Medications   nirmatrelvir/ritonavir EUA (PAXLOVID) TABS    Sig: Take 3 tablets by mouth 2 (two) times daily for 5 days. Patient GFR is 85    Dispense:  30 tablet    Refill:  0    Order Specific Question:   Supervising Provider    Answer:   BEDSOLE, AMY E [[id number]]   No orders of the defined types were placed in this encounter.   I discussed the assessment and treatment plan with the patient. The patient was provided an opportunity to ask questions and all were answered. The patient agreed with the plan and demonstrated an understanding of the instructions. The patient was advised to call back or seek an in-person  evaluation if the symptoms worsen or if the condition fails to improve as anticipated.  Follow up plan:  Start Paxlovid antiviral medication for Covid-19 infection. Take 3 tablets by mouth twice daily for five days.   Stay hydrated well with water.   Return precautions provided.   Pleas Koch, NP

## 2021-02-20 NOTE — Telephone Encounter (Addendum)
I spoke with pt; pt tested + covid with home test on 02/19/21. Pts symptoms started on 02/18/21 with dry cough, no SOB, pt has fever 100.5 last night; has not taken this AM; pt took ibuprofen this morning. Body aches, head congestion, scratchy S/T.pt has H/A with pain level of 4.pt is wondering about taking oral antiviral. Pt said had covid 11 months ago and has not gotten taste or smell back. Pt scheduled video visit today with Gentry Fitz NP at 10:20. UC & ED precautions given and pt voiced understanding. Sending note to Dr Glori Bickers as PCP, Gentry Fitz NP and [facility] CMA. Pt will have wt and temp when CMA calls an may need assistance with connection for appt.

## 2021-02-20 NOTE — Telephone Encounter (Signed)
And she has a fever, body aches, and congestion

## 2021-02-21 ENCOUNTER — Ambulatory Visit: Payer: PPO | Admitting: Rheumatology

## 2021-02-21 DIAGNOSIS — M170 Bilateral primary osteoarthritis of knee: Secondary | ICD-10-CM

## 2021-02-21 DIAGNOSIS — M19071 Primary osteoarthritis, right ankle and foot: Secondary | ICD-10-CM

## 2021-02-21 DIAGNOSIS — R768 Other specified abnormal immunological findings in serum: Secondary | ICD-10-CM

## 2021-02-21 DIAGNOSIS — L280 Lichen simplex chronicus: Secondary | ICD-10-CM

## 2021-02-21 DIAGNOSIS — R196 Halitosis: Secondary | ICD-10-CM

## 2021-02-21 DIAGNOSIS — Z8719 Personal history of other diseases of the digestive system: Secondary | ICD-10-CM

## 2021-02-21 DIAGNOSIS — Z8639 Personal history of other endocrine, nutritional and metabolic disease: Secondary | ICD-10-CM

## 2021-02-21 DIAGNOSIS — E042 Nontoxic multinodular goiter: Secondary | ICD-10-CM

## 2021-02-21 DIAGNOSIS — M19041 Primary osteoarthritis, right hand: Secondary | ICD-10-CM

## 2021-02-21 DIAGNOSIS — L309 Dermatitis, unspecified: Secondary | ICD-10-CM

## 2021-02-21 DIAGNOSIS — K6389 Other specified diseases of intestine: Secondary | ICD-10-CM

## 2021-02-21 DIAGNOSIS — M5030 Other cervical disc degeneration, unspecified cervical region: Secondary | ICD-10-CM

## 2021-02-21 DIAGNOSIS — M5136 Other intervertebral disc degeneration, lumbar region: Secondary | ICD-10-CM

## 2021-02-21 DIAGNOSIS — R1013 Epigastric pain: Secondary | ICD-10-CM

## 2021-03-02 NOTE — Progress Notes (Deleted)
Office Visit Note  Patient: Morgan [name]             Date of Birth: 1946-06-24           MRN: [id number]             PCP: Abner Greenspan, MD Referring: Tower, Wynelle Fanny, MD Visit Date: 03/15/2021 Occupation: '@GUAROCC'$ @  Subjective:  No chief complaint on file.   History of Present Illness: Morgan [name] is a 75 y.o. female ***   Activities of Daily Living:  Patient reports morning stiffness for *** {minute/hour:19697}.   Patient {ACTIONS;DENIES/REPORTS:21021675::"Denies"} nocturnal pain.  Difficulty dressing/grooming: {ACTIONS;DENIES/REPORTS:21021675::"Denies"} Difficulty climbing stairs: {ACTIONS;DENIES/REPORTS:21021675::"Denies"} Difficulty getting out of chair: {ACTIONS;DENIES/REPORTS:21021675::"Denies"} Difficulty using hands for taps, buttons, cutlery, and/or writing: {ACTIONS;DENIES/REPORTS:21021675::"Denies"}  No Rheumatology ROS completed.   PMFS History:  Patient Active Problem List   Diagnosis Date Noted   COVID-19 virus infection 02/20/2021   Current use of proton pump inhibitor 07/04/2020   Change in stool caliber 07/04/2020   Idiopathic small fiber sensory neuropathy 04/12/2020   Chest tightness 02/25/2020   Belching 02/09/2020   Vitamin D deficiency 10/05/2019   Neck pain 03/02/2019   Colon cancer screening 02/09/2019   Snoring 08/06/2017   Nasal congestion 08/06/2017   Paresthesia of foot, bilateral 08/12/2016   Encounter for Medicare annual wellness exam 04/13/2014   Allergic rhinitis 04/13/2014   Routine general medical examination at a health care facility 04/07/2014   Multiple thyroid nodules 01/24/2014   History of small bowel obstruction 01/20/2014   DDD (degenerative disc disease), cervical    DDD (degenerative disc disease), lumbar    Spinal stenosis    Obese    TINNITUS 06/08/2010   NONSPECIFIC ABNORMAL TOXICOLOGICAL FINDINGS 05/11/2010   Hypothyroidism 04/18/2010   BRADYCARDIA 05/31/2008   GERD 05/03/2008   Dyspepsia 05/03/2008    DIVERTICULOSIS OF COLON 04/26/2008   HEPATITIS, HX OF 04/26/2008   COLONIC POLYPS, HYPERPLASTIC, HX OF 04/26/2008   HEMORRHOIDS, INTERNAL 01/11/2008   DERMATITIS, SEBORRHEIC NEC 01/28/2007   Hyperlipidemia 01/19/2007   IBS 01/19/2007   OVERACTIVE BLADDER 01/19/2007   POSTMENOPAUSAL STATUS 01/19/2007   ECZEMA 01/19/2007   Osteoarthritis 01/19/2007   ALLERGY 01/19/2007    Past Medical History:  Diagnosis Date   Allergy    Bradycardia    Collagen vascular disease ([place])    Connective tissue disease ([place])    COVID-19    DDD (degenerative disc disease), cervical    DDD (degenerative disc disease), lumbar    Endometrial polyp    not resolved with D&C (following)   GERD (gastroesophageal reflux disease)    gas/belching issues-was on protonix for short time-not since 6 months   HLD (hyperlipidemia)    Mixed connective tissue disease ([place]) diagnosed 5 years ago   no problems since diagnosis   Multiple thyroid nodules 01/24/2014   Obesity    Osteoarthritis of cervical spine    PONV (postoperative nausea and vomiting)    Renal calculi    Spinal stenosis     Family History  Problem Relation Age of Onset   Coronary artery disease Father    Heart attack Father        deceased at 55   Dementia Mother    Diabetes Maternal Grandmother    Goiter Maternal Grandmother    Diabetes Cousin    Cancer Grandchild        rare type (dying at age 66)   Colon cancer Neg Hx    Esophageal cancer Neg Hx  Stomach cancer Neg Hx    Rectal cancer Neg Hx    Colon polyps Neg Hx    Past Surgical History:  Procedure Laterality Date   APPENDECTOMY     COLONOSCOPY     DILATION AND CURETTAGE OF UTERUS     fibroids   DILATION AND CURETTAGE OF UTERUS  01/2005   endometrial polyp   ENDOMETRIAL BIOPSY  2008   fibroid   HAND SURGERY     carpal tunnel release bilateral Dr. Daylene Katayama   hysteroscopic resection      polyps of PMB   TONSILLECTOMY     TUBAL LIGATION     1973   UPPER GASTROINTESTINAL  ENDOSCOPY     Social History   Social History Narrative   + caffeine use     Immunization History  Administered Date(s) Administered   Fluad Quad(high Dose 65+) 04/03/2019, 04/23/2019, 07/04/2020   Influenza,inj,Quad PF,6+ Mos 04/13/2014, 09/22/2015, 08/12/2016, 08/06/2017   Pneumococcal Conjugate-13 04/13/2014   Pneumococcal Polysaccharide-23 09/15/2012   Td 11/07/1997, 01/28/2007   Tdap 05/28/2017   Zoster Recombinat (Shingrix) 02/13/2018, 07/20/2018   Zoster, Live 09/09/2006     Objective: Vital Signs: LMP 07/22/1989 (Approximate)    Physical Exam   Musculoskeletal Exam: ***  CDAI Exam: CDAI Score: -- Patient Global: --; Provider Global: -- Swollen: --; Tender: -- Joint Exam 03/15/2021   No joint exam has been documented for this visit   There is currently no information documented on the homunculus. Go to the Rheumatology activity and complete the homunculus joint exam.  Investigation: No additional findings.  Imaging: No results found.  Recent Labs: Lab Results  Component Value Date   WBC 4.3 07/31/2020   HGB 13.0 07/31/2020   PLT 216.0 07/31/2020   NA 141 09/13/2020   K 4.0 09/13/2020   CL 106 09/13/2020   CO2 31 09/13/2020   GLUCOSE 75 09/13/2020   BUN 18 09/13/2020   CREATININE 0.69 09/13/2020   BILITOT 0.6 07/31/2020   ALKPHOS 110 07/31/2020   AST 17 07/31/2020   ALT 12 07/31/2020   PROT 6.2 07/31/2020   ALBUMIN 4.4 07/31/2020   CALCIUM 9.3 09/13/2020   GFRAA 96 03/13/2020    Speciality Comments: No specialty comments available.  Procedures:  No procedures performed Allergies: Codeine, Hydrocodone-acetaminophen, and Penicillins   Assessment / Plan:     Visit Diagnoses: No diagnosis found.  Orders: No orders of the defined types were placed in this encounter.  No orders of the defined types were placed in this encounter.   Face-to-face time spent with patient was *** minutes. Greater than 50% of time was spent in counseling and  coordination of care.  Follow-Up Instructions: No follow-ups on file.   Earnestine Mealing, CMA  Note - This record has been created using Editor, commissioning.  Chart creation errors have been sought, but may not always  have been located. Such creation errors do not reflect on  the standard of medical care.

## 2021-03-15 ENCOUNTER — Ambulatory Visit: Payer: PPO | Admitting: Rheumatology

## 2021-03-15 DIAGNOSIS — M5136 Other intervertebral disc degeneration, lumbar region: Secondary | ICD-10-CM

## 2021-03-15 DIAGNOSIS — R768 Other specified abnormal immunological findings in serum: Secondary | ICD-10-CM

## 2021-03-15 DIAGNOSIS — L280 Lichen simplex chronicus: Secondary | ICD-10-CM

## 2021-03-15 DIAGNOSIS — R1013 Epigastric pain: Secondary | ICD-10-CM

## 2021-03-15 DIAGNOSIS — E042 Nontoxic multinodular goiter: Secondary | ICD-10-CM

## 2021-03-15 DIAGNOSIS — M19071 Primary osteoarthritis, right ankle and foot: Secondary | ICD-10-CM

## 2021-03-15 DIAGNOSIS — M5030 Other cervical disc degeneration, unspecified cervical region: Secondary | ICD-10-CM

## 2021-03-15 DIAGNOSIS — Z8639 Personal history of other endocrine, nutritional and metabolic disease: Secondary | ICD-10-CM

## 2021-03-15 DIAGNOSIS — M19041 Primary osteoarthritis, right hand: Secondary | ICD-10-CM

## 2021-03-15 DIAGNOSIS — K6389 Other specified diseases of intestine: Secondary | ICD-10-CM

## 2021-03-15 DIAGNOSIS — R196 Halitosis: Secondary | ICD-10-CM

## 2021-03-15 DIAGNOSIS — M170 Bilateral primary osteoarthritis of knee: Secondary | ICD-10-CM

## 2021-03-15 DIAGNOSIS — L309 Dermatitis, unspecified: Secondary | ICD-10-CM

## 2021-03-15 DIAGNOSIS — Z8719 Personal history of other diseases of the digestive system: Secondary | ICD-10-CM

## 2021-04-23 DIAGNOSIS — M5441 Lumbago with sciatica, right side: Secondary | ICD-10-CM | POA: Diagnosis not present

## 2021-04-23 DIAGNOSIS — M5416 Radiculopathy, lumbar region: Secondary | ICD-10-CM | POA: Diagnosis not present

## 2021-05-15 DIAGNOSIS — Z1231 Encounter for screening mammogram for malignant neoplasm of breast: Secondary | ICD-10-CM | POA: Diagnosis not present

## 2021-05-15 LAB — HM MAMMOGRAPHY

## 2021-05-16 ENCOUNTER — Other Ambulatory Visit: Payer: Self-pay

## 2021-05-16 ENCOUNTER — Other Ambulatory Visit: Payer: Self-pay | Admitting: Neurosurgery

## 2021-05-16 ENCOUNTER — Ambulatory Visit: Payer: PPO | Admitting: Dermatology

## 2021-05-16 DIAGNOSIS — L821 Other seborrheic keratosis: Secondary | ICD-10-CM

## 2021-05-16 DIAGNOSIS — L578 Other skin changes due to chronic exposure to nonionizing radiation: Secondary | ICD-10-CM

## 2021-05-16 DIAGNOSIS — L738 Other specified follicular disorders: Secondary | ICD-10-CM

## 2021-05-16 DIAGNOSIS — L820 Inflamed seborrheic keratosis: Secondary | ICD-10-CM | POA: Diagnosis not present

## 2021-05-16 DIAGNOSIS — L650 Telogen effluvium: Secondary | ICD-10-CM

## 2021-05-16 DIAGNOSIS — L570 Actinic keratosis: Secondary | ICD-10-CM | POA: Diagnosis not present

## 2021-05-16 DIAGNOSIS — M5416 Radiculopathy, lumbar region: Secondary | ICD-10-CM

## 2021-05-16 NOTE — Progress Notes (Signed)
Follow-Up Visit   Subjective  Morgan [name] is a 75 y.o. female who presents for the following: Follow-up (Patient reports a spot at forehead and some spots at chest . Patient reports sk on right upper thigh she would like treated. ).  The following portions of the chart were reviewed this encounter and updated as appropriate:  Tobacco  Allergies  Meds  Problems  Med Hx  Surg Hx  Fam Hx      Review of Systems: No other skin or systemic complaints except as noted in HPI or Assessment and Plan.   Objective  Well appearing patient in no apparent distress; mood and affect are within normal limits.  A focused examination was performed including chest, scalp, face, bilateral arms, right thigh. Relevant physical exam findings are noted in the Assessment and Plan.  low mid chest x 1, right thigh x 1 Erythematous keratotic or waxy stuck-on papule or plaque.   Scalp Diffuse thinning of hair, positive hair pull test.   Assessment & Plan  Inflamed seborrheic keratosis low mid chest x 1, right thigh x 1  Symptomatic  Prior to procedure, discussed risks of blister formation, small wound, skin dyspigmentation, or rare scar following cryotherapy. Recommend Vaseline ointment to treated areas while healing.   Destruction of lesion - low mid chest x 1, right thigh x 1  Destruction method: cryotherapy   Informed consent: discussed and consent obtained   Lesion destroyed using liquid nitrogen: Yes   Region frozen until ice ball extended beyond lesion: Yes   Outcome: patient tolerated procedure well with no complications   Post-procedure details: wound care instructions given    Telogen effluvium Scalp  Telogen effluvium is a benign, self limited condition causing increased hair shedding usually for several months. It does not progress to baldness, and the hair eventually grows back on its own. It can be triggered by recent illness, recent surgery, thyroid disease, low iron stores,  vitamin D deficiency, fad diets or rapid weight loss, hormonal changes such as pregnancy or birth control pills, and some medication. Usually the hair loss starts 2-3 months after the illness or health change. Rarely, it can continue for longer than a year.  Reports history of COVID x2  History of thyroid nodules, on levothyroxine Reviewed labs for thyroid - normal  Reviewed vitamin D lab - normal  Recommend Recommend minoxidil 5% (Rogaine for men) solution or foam to be applied to the scalp and left in. This should ideally be used twice daily for best results but it helps with hair regrowth when used at least three times per week. Rogaine initially can cause increased hair shedding for the first few weeks but this will stop with continued use. In studies, people who used minoxidil (Rogaine) for at least 6 months had thicker hair than people who did not. Minoxidil topical (Rogaine) only works as long as it continues to be used. If if it is no longer used then the hair it has been helping to regrow can fall out. Minoxidil topical (Rogaine) can cause increased facial hair growth which can usually be managed easily with a battery-operated hair trimmer. If facial hair growth is bothersome, switching to the 2% women's version can decrease the risk of unwanted facial hair growth.  Will check iron stores/ferritin  Ferritin - Scalp  Seborrheic Keratoses - Stuck-on, waxy, tan-brown papules and/or plaques at temple - Benign-appearing - Discussed benign etiology and prognosis. - Observe - Call for any changes  Sebaceous Hyperplasia -  Small yellow papules with a central dell - Benign - Observe  Actinic Damage - Severe, confluent actinic changes with pre-cancerous actinic keratoses  - Severe, chronic, not at goal, secondary to cumulative UV radiation exposure over time - diffuse scaly erythematous macules and papules with underlying dyspigmentation - Discussed Prescription "Field Treatment" for  Severe, Chronic Confluent Actinic Changes with Pre-Cancerous Actinic Keratoses Field treatment involves treatment of an entire area of skin that has confluent Actinic Changes (Sun/ Ultraviolet light damage) and PreCancerous Actinic Keratoses by method of PhotoDynamic Therapy (PDT) and/or prescription Topical Chemotherapy agents such as 5-fluorouracil, 5-fluorouracil/calcipotriene, and/or imiquimod.  The purpose is to decrease the number of clinically evident and subclinical PreCancerous lesions to prevent progression to development of skin cancer by chemically destroying early precancer changes that may or may not be visible.  It has been shown to reduce the risk of developing skin cancer in the treated area. As a result of treatment, redness, scaling, crusting, and open sores may occur during treatment course. One or more than one of these methods may be used and may have to be used several times to control, suppress and eliminate the PreCancerous changes. Discussed treatment course, expected reaction, and possible side effects. - Recommend daily broad spectrum sunscreen SPF 30+ to sun-exposed areas, reapply every 2 hours as needed.  - Staying in the shade or wearing long sleeves, sun glasses (UVA+UVB protection) and wide brim hats (4-inch brim around the entire circumference of the hat) are also recommended. - Call for new or changing lesions. Start 5-fluorouracil/calcipotriene cream twice a day for 4 days to affected areas including edge of left eyebrow, mid forehead , and right temple  Prescription sent to [facility]. Patient provided with contact information for pharmacy and advised the pharmacy will mail the prescription to their home. Patient provided with handout reviewing treatment course and side effects and advised to call or message [place] on MyChart with any concerns. - Will schedule photodynamic therapy to the bilateral arms and hands, and chest   Return for  scheduled photodynamic therapy  to the bilateral arms and hands, and chest , february ak follow up. I, Ruthell Rummage, CMA, am acting as scribe for Forest Gleason, MD.  Documentation: I have reviewed the above documentation for accuracy and completeness, and I agree with the above.  Forest Gleason, MD

## 2021-05-16 NOTE — Patient Instructions (Addendum)
Recommend minoxidil 5% (Rogaine for men) solution or foam to be applied to the scalp and left in. This should ideally be used twice daily for best results but it helps with hair regrowth when used at least three times per week. Rogaine initially can cause increased hair shedding for the first few weeks but this will stop with continued use. In studies, people who used minoxidil (Rogaine) for at least 6 months had thicker hair than people who did not. Minoxidil topical (Rogaine) only works as long as it continues to be used. If if it is no longer used then the hair it has been helping to regrow can fall out. Minoxidil topical (Rogaine) can cause increased facial hair growth which can usually be managed easily with a battery-operated hair trimmer. If facial hair growth is bothersome, switching to the 2% women's version can decrease the risk of unwanted facial hair growth.  Telogen effluvium is a benign, self limited condition causing increased hair shedding usually for several months. It does not progress to baldness, and the hair eventually grows back on its own. It can be triggered by recent illness, recent surgery, thyroid disease, low iron stores, vitamin D deficiency, fad diets or rapid weight loss, hormonal changes such as pregnancy or birth control pills, and some medication. Usually the hair loss starts 2-3 months after the illness or health change. Rarely, it can continue for longer than a year.     - Start 5-fluorouracil/calcipotriene cream twice a day for 4 days to affected areas including edge of left eyebrow, mid forehead, and right temple.   Prescription sent to [facility]. Patient advised they will receive an email to purchase the medication online and have it sent to their home. Patient provided with handout reviewing treatment course and side effects and advised to call or message [place] on MyChart with any concerns.  5-Fluorouracil/Calcipotriene Patient Education   Actinic keratoses are  the dry, red scaly spots on the skin caused by sun damage. A portion of these spots can turn into skin cancer with time, and treating them can help prevent development of skin cancer.   Treatment of these spots requires removal of the defective skin cells. There are various ways to remove actinic keratoses, including freezing with liquid nitrogen, treatment with creams, or treatment with a blue light procedure in the office.   5-fluorouracil cream is a topical cream used to treat actinic keratoses. It works by interfering with the growth of abnormal fast-growing skin cells, such as actinic keratoses. These cells peel off and are replaced by healthy ones.   5-fluorouracil/calcipotriene is a combination of the 5-fluorouracil cream with a vitamin D analog cream called calcipotriene. The calcipotriene alone does not treat actinic keratoses. However, when it is combined with 5-fluorouracil, it helps the 5-fluorouracil treat the actinic keratoses much faster so that the same results can be achieved with a much shorter treatment time.  INSTRUCTIONS FOR 5-FLUOROURACIL/CALCIPOTRIENE CREAM:   5-fluorouracil/calcipotriene cream typically only needs to be used for 4-7 days. A thin layer should be applied twice a day to the treatment areas recommended by your physician.   If your physician prescribed you separate tubes of 5-fluourouracil and calcipotriene, apply a thin layer of 5-fluorouracil followed by a thin layer of calcipotriene.   Avoid contact with your eyes, nostrils, and mouth. Do not use 5-fluorouracil/calcipotriene cream on infected or open wounds.   You will develop redness, irritation and some crusting at areas where you have pre-cancer damage/actinic keratoses. IF YOU DEVELOP PAIN,  BLEEDING, OR SIGNIFICANT CRUSTING, STOP THE TREATMENT EARLY - you have already gotten a good response and the actinic keratoses should clear up well.  Wash your hands after applying 5-fluorouracil 5% cream on your skin.    A moisturizer or sunscreen with a minimum SPF 30 should be applied each morning.   Once you have finished the treatment, you can apply a thin layer of Vaseline twice a day to irritated areas to soothe and calm the areas more quickly. If you experience significant discomfort, contact your physician.  For some patients it is necessary to repeat the treatment for best results.  SIDE EFFECTS: When using 5-fluorouracil/calcipotriene cream, you may have mild irritation, such as redness, dryness, swelling, or a mild burning sensation. This usually resolves within 2 weeks. The more actinic keratoses you have, the more redness and inflammation you can expect during treatment. Eye irritation has been reported rarely. If this occurs, please let us know.  If you have any trouble using this cream, please call the office. If you have any other questions about this information, please do not hesitate to ask me before you leave the office.  If you have any questions or concerns for your doctor, please call our main line at [phone] and press option 4 to reach your doctor's medical assistant. If no one answers, please leave a voicemail as directed and we will return your call as soon as possible. Messages left after 4 pm will be answered the following business day.   You may also send [place] a message via [place]. We typically respond to MyChart messages within 1-2 business days.  For prescription refills, please ask your pharmacy to contact our office. Our fax number is [phone].  If you have an urgent issue when the clinic is closed that cannot wait until the next business day, you can page your doctor at the number below.    Please note that while we do our best to be available for urgent issues outside of office hours, we are not available 24/7.   If you have an urgent issue and are unable to reach [place], you may choose to seek medical care at your doctor's office, retail clinic, urgent care center, or  emergency room.  If you have a medical emergency, please immediately call 911 or go to the emergency department.  Pager Numbers  - Dr. Nehemiah Massed: [phone]  - Dr. Laurence Ferrari: [phone]  - Dr. Nicole Kindred: [phone]  In the event of inclement weather, please call our main line at [phone] for an update on the status of any delays or closures.  Dermatology Medication Tips: Please keep the boxes that topical medications come in in order to help keep track of the instructions about where and how to use these. Pharmacies typically print the medication instructions only on the boxes and not directly on the medication tubes.   If your medication is too expensive, please contact our office at [phone] option 4 or send [place] a message through [place].   We are unable to tell what your co-pay for medications will be in advance as this is different depending on your insurance coverage. However, we may be able to find a substitute medication at lower cost or fill out paperwork to get insurance to cover a needed medication.   If a prior authorization is required to get your medication covered by your insurance company, please allow [place] 1-2 business days to complete this process.  Drug prices often vary depending on where the prescription is  filled and some pharmacies may offer cheaper prices.  The website www.goodrx.com contains coupons for medications through different pharmacies. The prices here do not account for what the cost may be with help from insurance (it may be cheaper with your insurance), but the website can give you the price if you did not use any insurance.  - You can print the associated coupon and take it with your prescription to the pharmacy.  - You may also stop by our office during regular business hours and pick up a GoodRx coupon card.  - If you need your prescription sent electronically to a different pharmacy, notify our office through [facility] or by phone at  [phone] option 4.

## 2021-05-17 DIAGNOSIS — L650 Telogen effluvium: Secondary | ICD-10-CM | POA: Diagnosis not present

## 2021-05-18 ENCOUNTER — Telehealth: Payer: Self-pay | Admitting: Family Medicine

## 2021-05-18 LAB — FERRITIN: Ferritin: 473 ng/mL — ABNORMAL HIGH (ref 15–150)

## 2021-05-18 NOTE — Telephone Encounter (Signed)
That is fine, no problem.  She should find out if her insurance covers it first so she knows if she may have to pay.  Thanks

## 2021-05-18 NOTE — Telephone Encounter (Signed)
Pt called stating that she has appt  08/22/20 for physical asking if Dr Glori Bickers would do her PAP at the same time as her physical. Please advise.

## 2021-05-20 ENCOUNTER — Ambulatory Visit
Admission: RE | Admit: 2021-05-20 | Discharge: 2021-05-20 | Disposition: A | Discharge status: Home / Self Care | Payer: PPO | Source: Ambulatory Visit | Attending: Neurosurgery | Admitting: Neurosurgery

## 2021-05-20 ENCOUNTER — Other Ambulatory Visit: Payer: Self-pay

## 2021-05-20 DIAGNOSIS — M5416 Radiculopathy, lumbar region: Secondary | ICD-10-CM

## 2021-05-20 DIAGNOSIS — M545 Low back pain, unspecified: Secondary | ICD-10-CM | POA: Diagnosis not present

## 2021-05-20 DIAGNOSIS — M48061 Spinal stenosis, lumbar region without neurogenic claudication: Secondary | ICD-10-CM | POA: Diagnosis not present

## 2021-05-21 NOTE — Telephone Encounter (Signed)
Left VM letting pt know Dr. Marliss Coots comments regarding pap smears

## 2021-05-22 ENCOUNTER — Telehealth: Payer: Self-pay

## 2021-05-22 NOTE — Telephone Encounter (Signed)
Patient called back to add a little more information to this issue. She has lost about 35 pounds over the last year. Patient has also been on synthroid for the last 7 years  prescribed by a endocrinologist due to two nodules on her thyroid. Patient states her labs starting this medication were not hypo or hyper but on the lower end of the "normal range". She has done a little research finding that long term use of synthroid can elevate the ferritin levels.   She will wait for a return call from [place] regarding situation and additional labs.

## 2021-05-22 NOTE — Telephone Encounter (Signed)
-----   Message from Alfonso Patten, MD sent at 05/22/2021 11:25 AM EDT ----- Ferritin is high. This means there is no issue with low iron stores but it can be high in a few different conditions, often just because of other chronic diseases (like her mixed connective tissue disease). This should not be related to her hair loss. However, we should confirm there's no excess of iron or other issue for her general health. Please call lab to see if we can add on the following:  Serum iron Transferrin saturation TIBC (total iron binding capacity)  If not, would recommend patient go back to lab for these additional tests to better determine why ferritin is high.   However, it would not be causing her hair loss. Suspect hair loss likely related to her repeat COVID infections. Thyroid testing and vitamin D testing previously were normal.   Please confirm she feels well overall with no recent medical changes other than her hair loss. Thank you!

## 2021-05-22 NOTE — Telephone Encounter (Signed)
Called [facility] to have add on testing done for serum iron, transferrin saturation and TIBC. Then called patient to review results and told her we would call once we receive results for additional testing. I asked patient about any recent medical changes and she said in the last week she has started having headaches in the morning. She does have a pinched nerve and sciatica and has been on prednisone and taking ibuprofen. Patient had MRI on Sunday and is meeting with neuro PA on Monday to review results.

## 2021-05-23 NOTE — Telephone Encounter (Signed)
Please thank her for the additional info. For now, I recommend we wait for additional test results. The high ferritin may be related to the synthroid or could be related to underlying inflammation. As long as it is not a sign of iron overload or other issue (which we are checking for), high ferritin by itself is common and is not dangerous.

## 2021-05-24 ENCOUNTER — Encounter: Payer: Self-pay | Admitting: Dermatology

## 2021-05-25 ENCOUNTER — Telehealth: Payer: Self-pay | Admitting: *Deleted

## 2021-05-25 NOTE — Telephone Encounter (Signed)
She may discuss this further with her PCP.

## 2021-05-25 NOTE — Telephone Encounter (Signed)
Patient states she was seen by her dermatologist and had some blood work. The blood work showed her ferritin was elevated at 473. Patient would like to know if she should schedule an appointment in our office or who she should follow up with. Please advise.

## 2021-05-25 NOTE — Telephone Encounter (Signed)
Patient advised she may discuss this further with her PCP.

## 2021-05-26 LAB — TRANSFERRIN: Transferrin: 237 mg/dL (ref 192–364)

## 2021-05-26 LAB — IRON AND TIBC

## 2021-05-26 LAB — SPECIMEN STATUS REPORT

## 2021-05-28 DIAGNOSIS — R030 Elevated blood-pressure reading, without diagnosis of hypertension: Secondary | ICD-10-CM | POA: Diagnosis not present

## 2021-05-28 DIAGNOSIS — M5416 Radiculopathy, lumbar region: Secondary | ICD-10-CM | POA: Diagnosis not present

## 2021-05-29 ENCOUNTER — Emergency Department (HOSPITAL_COMMUNITY): Payer: PPO

## 2021-05-29 ENCOUNTER — Other Ambulatory Visit: Payer: Self-pay

## 2021-05-29 ENCOUNTER — Telehealth: Payer: Self-pay

## 2021-05-29 ENCOUNTER — Other Ambulatory Visit: Payer: Self-pay | Admitting: Dermatology

## 2021-05-29 ENCOUNTER — Emergency Department (HOSPITAL_COMMUNITY)
Admission: EM | Admit: 2021-05-29 | Discharge: 2021-05-29 | Discharge status: Against Medical Advice | Payer: PPO | Attending: Student | Admitting: Student

## 2021-05-29 ENCOUNTER — Encounter (HOSPITAL_COMMUNITY): Payer: Self-pay

## 2021-05-29 ENCOUNTER — Ambulatory Visit: Payer: PPO | Admitting: Nurse Practitioner

## 2021-05-29 ENCOUNTER — Telehealth: Payer: PPO | Admitting: Family Medicine

## 2021-05-29 ENCOUNTER — Encounter: Payer: Self-pay | Admitting: Nurse Practitioner

## 2021-05-29 VITALS — BP 128/67 | HR 67 | Ht 64.0 in | Wt 148.1 lb

## 2021-05-29 DIAGNOSIS — R109 Unspecified abdominal pain: Secondary | ICD-10-CM

## 2021-05-29 DIAGNOSIS — R7989 Other specified abnormal findings of blood chemistry: Secondary | ICD-10-CM

## 2021-05-29 DIAGNOSIS — Z5321 Procedure and treatment not carried out due to patient leaving prior to being seen by health care provider: Secondary | ICD-10-CM | POA: Insufficient documentation

## 2021-05-29 DIAGNOSIS — K5900 Constipation, unspecified: Secondary | ICD-10-CM | POA: Diagnosis not present

## 2021-05-29 DIAGNOSIS — R1084 Generalized abdominal pain: Secondary | ICD-10-CM | POA: Diagnosis not present

## 2021-05-29 DIAGNOSIS — R110 Nausea: Secondary | ICD-10-CM | POA: Diagnosis not present

## 2021-05-29 LAB — COMPREHENSIVE METABOLIC PANEL
ALT: 131 U/L — ABNORMAL HIGH (ref 0–44)
AST: 73 U/L — ABNORMAL HIGH (ref 15–41)
Albumin: 3.8 g/dL (ref 3.5–5.0)
Alkaline Phosphatase: 366 U/L — ABNORMAL HIGH (ref 38–126)
Anion gap: 8 (ref 5–15)
BUN: 9 mg/dL (ref 8–23)
CO2: 26 mmol/L (ref 22–32)
Calcium: 9 mg/dL (ref 8.9–10.3)
Chloride: 101 mmol/L (ref 98–111)
Creatinine, Ser: 0.55 mg/dL (ref 0.44–1.00)
GFR, Estimated: >60 mL/min (ref >60)
Glucose, Bld: 104 mg/dL — ABNORMAL HIGH (ref 70–99)
Potassium: 4.1 mmol/L (ref 3.5–5.1)
Sodium: 135 mmol/L (ref 135–145)
Total Bilirubin: 3.2 mg/dL — ABNORMAL HIGH (ref 0.3–1.2)
Total Protein: 6.4 g/dL — ABNORMAL LOW (ref 6.5–8.1)

## 2021-05-29 LAB — CBC WITH DIFFERENTIAL/PLATELET
Abs Immature Granulocytes: 0.01 10*3/uL (ref 0.00–0.07)
Basophils Absolute: 0 10*3/uL (ref 0.0–0.1)
Basophils Relative: 0 %
Eosinophils Absolute: 0.1 10*3/uL (ref 0.0–0.5)
Eosinophils Relative: 1 %
HCT: 37.3 % (ref 36.0–46.0)
Hemoglobin: 12.3 g/dL (ref 12.0–15.0)
Immature Granulocytes: 0 %
Lymphocytes Relative: 9 %
Lymphs Abs: 0.6 10*3/uL — ABNORMAL LOW (ref 0.7–4.0)
MCH: 30.5 pg (ref 26.0–34.0)
MCHC: 33 g/dL (ref 30.0–36.0)
MCV: 92.6 fL (ref 80.0–100.0)
Monocytes Absolute: 1.3 10*3/uL — ABNORMAL HIGH (ref 0.1–1.0)
Monocytes Relative: 19 %
Neutro Abs: 5 10*3/uL (ref 1.7–7.7)
Neutrophils Relative %: 71 %
Platelets: 199 10*3/uL (ref 150–400)
RBC: 4.03 MIL/uL (ref 3.87–5.11)
RDW: 13.6 % (ref 11.5–15.5)
WBC: 7 10*3/uL (ref 4.0–10.5)
nRBC: 0 % (ref 0.0–0.2)

## 2021-05-29 LAB — LIPASE, BLOOD: Lipase: 24 U/L (ref 11–51)

## 2021-05-29 MED ORDER — IOHEXOL 350 MG/ML SOLN
80.0000 mL | Freq: Once | INTRAVENOUS | Status: AC | PRN
  Administered 2021-05-29: 80 mL via INTRAVENOUS

## 2021-05-29 NOTE — Patient Instructions (Signed)
If you are age 74 or older, your body mass index should be between 23-30. Your Body mass index is 25.43 kg/m. If this is out of the aforementioned range listed, please consider follow up with your Primary Care Provider.  The [place] [facility] providers would like to encourage you to use [facility] to communicate with providers for non-urgent requests or questions.  Due to long hold times on the telephone, sending your provider a message by [facility] may be faster and more efficient way to get a response. Please allow 48 business hours for a response.  Please remember that this is for non-urgent requests/questions.  LABS:  Lab work has been ordered for you to do on Thursday. Our lab is located in the basement. Press "B" on the elevator. The lab is located at the first door on the left as you exit the elevator.  HEALTHCARE LAWS AND MY CHART RESULTS: Due to recent changes in healthcare laws, you may see the results of your imaging and laboratory studies on MyChart before your provider has had a chance to review them.   We understand that in some cases there may be results that are confusing or concerning to you. Not all laboratory results come back in the same time frame and the provider may be waiting for multiple results in order to interpret others.  Please give [place] 48 hours in order for your provider to thoroughly review all the results before contacting the office for clarification of your results.   IMAGING: You will be contacted by [facility] Scheduling (Your caller ID will indicate phone # [phone]) in the next 7 days to schedule your Right upper quadrant ultrasound. If you have not heard from them within 7 business days, please call [facility] at [phone] to follow up on the status of your appointment.    We will contact you with all the test results. It was great seeing you today! Thank you for entrusting me with your care and choosing [facility].  [name], NP

## 2021-05-29 NOTE — Telephone Encounter (Addendum)
Called patient and informed them of need to collect more labs. Patient states she has had some health issue and will plan to stop by as soon as she is able.  Routing to Provider to inform.    ----- Message from Alfonso Patten, MD sent at 05/29/2021 12:21 PM EST ----- Transferrin wnl They were unable to add on Serum Iron or Transferrin Saturation. Will order those and please request patient stop back by lab. Thank you!

## 2021-05-29 NOTE — ED Provider Notes (Signed)
Emergency Medicine Provider Triage Evaluation Note  Morgan [name] , a 75 y.o. female  was evaluated in triage.  Pt complains of abd pain. Intermittent over last week however worse at 4 AM. Does not radiate. No CP, SOB. Urine is dark. Only very small stool without melena or BRBPR. Hx fo SBO feels similar. No emesis however with nausea. No hx of AAA, dissection, chronic NSAID use  Review of Systems  Positive: Abd pain, nausea, constipation Negative: Fever, CP, back pain, numbness  Physical Exam  BP 132/74 (BP Location: Right Arm)   Pulse 71   Temp 98 F (36.7 C) (Oral)   Resp 16   Ht 5\' 4"  (1.626 m)   LMP 07/22/1989 (Approximate)   SpO2 100%   BMI 26.43 kg/m  Gen:   Awake, no distress   Resp:  Normal effort  ABD:  Gen tenderness MSK:   Moves extremities without difficulty  Other:    Medical Decision Making  Medically screening exam initiated at 7:56 AM.  Appropriate orders placed.  Morgan [name] was informed that the remainder of the evaluation will be completed by another provider, this initial triage assessment does not replace that evaluation, and the importance of remaining in the ED until their evaluation is complete.  Abd pain, nausea  Stable VS, workup started    Lubna Stegeman A, PA-C 05/29/21 [place], [place], MD 05/29/21 [phone]

## 2021-05-29 NOTE — ED Triage Notes (Signed)
Pt arrived via POV, c/o diffuse abd pain. States she is concerned she has an intestinal blockage.

## 2021-05-29 NOTE — Progress Notes (Signed)
ASSESSMENT AND PLAN    #75 year old female with history of chronic bloating/gas/ positive SIBO test / SBO in 2015.  Here with severe, generalized abdominal pain and distention and low-grade fevers all preceded by a few weeks of loud intestinal gurgling.  Symptoms resolved after an episode of large volume emesis over the weekend but recurrent distention last night . Seen in ED today. CTAP w/ contrast today was unrevealing.  It sounds like she could be having a low grade intermittent partial small bowel obstruction.  Ileus secondary to an infectious process is also a possibility though it should be noted that there was no obstruction/ileus on CT scan today.  Complicating the picture is a new elevation in her liver chemistries which may be reactive secondary to some infectious process or an indication of a primary hepatobiliary problem not seen on CT scan. She hasn't started any new medications to account for abnormal liver tests.  -- Nontoxic-appearing, abdominal exam is not overly concerning.  Normal WBC in the ED today. --Obtain RUQ to evaluate for cholelithiasis. In the interim will repeat liver chemistries in 2 days.  --When she is feeling a little better I will arrange for an upper GI series with small bowel follow-through to evaluate for low grade SBO.  Right now I do not think she could tolerate the contrast   # Elevated ferritin (473), could be secondary to an infectious/inflammatory process also responsible for current GI symptoms.  Apparently iron studies were obtained by Dermatologist as part of hair loss evaluation. Additional iron studies pending (ordered by Dermatologist).    HISTORY OF PRESENT ILLNESS    Chief Complaint : Abdominal pain, bloating  Morgan [name] is a 75 y.o. female known to Dr. Silverio Decamp with a past medical history chronic GERD, SBO secondary to adhesions,  SIBO, IBS with constipation diverticulosis,.  Additional medical history as listed in [place] .   Morgan [name] has  been seen here several times over the last year or so for evaluation of weight loss , bloating , gas and bowel changes (narrow caliber stool).  She had a positive SIBO test and had significant improvement in symptoms with a course of metronidazole.  Unfortunately the bloating eventually recurred, bowel changes and unintentional weight loss persisted. For further evaluation CTAP was done March 2022 and negative for any acute abnormalities. She was advised to avoid raw vegetables  / high-fiber foods due to her 2015 history of SBO secondary to adhesions.  At one time we suggested that she admit lactose from her diet for a while to see if symptoms would improve.  At the time of her last visit here 11/15/2020 patient was having acute diarrhea felt to be infectious gastroenteritis.  GI pathogen panel including C. difficile was negative.  She also had a fecal elastase which was normal.  Unintentional weight loss previously attributed to altered taste and diminished appetite which started with COVID infection. She has had COVID19 twice.     INTERVAL HISTORY:  Since her last visit her late April Morgan [name] had been doing okay. However, a few weeks ago she began having a lot of loud intestinal gurgling and possibly decreased passage of flatus. Over the last few days she has had ~ 3 episodes of severe, non-radiating generalized abdominal pain with low grade fevers.  On Saturday ( 3 days ago) she suddenly vomited a large amount emesis with immediate resolution of abdominal pain. Last night she had recurrent abdominal distention after eating a small amount of food (  baked potatoes and two vegetables). She laid on her bed pressing abdomen in an attempt to get rid of gas.  She has not been particularly constipated, takes MiraLAX as needed. She decided to go to the ED this morning.  She did get labs and a CT scan but due to extended wait times she ended up leaving before evaluation was complete  ED labs:  Alkaline phosphatase 366,  total bilirubin 3.2, AST 73, ALT 131.  WBC 7, hemoglobin 12.3 CTAP w/ contrast -no acute abdominal/pelvic processes.  No liver abnormalities.  Gallbladder distended with bowel.  No gallbladder wall thickening or evidence of gallstones.  No evidence for cholecystitis.  No biliary duct dilation.  Pancreas unremarkable.   Morgan [name] has not recently started any new medications to account for the abnormal liver test.  No alcohol use.   As a side note, patient mentions an increase ferritin level recently found by her Dermatologist as a part of hair loss evaluation.  Additional iron studies pending.    PREVIOUS ENDOSCOPIC EVALUATIONS / PERTINENT STUDIES:   Colonoscopy September 2020 --Left-sided diverticulosis and  EGD August 2021 --LA grade B reflux esophagitis, small hiatal hernia  CTAP March 2022 --No acute pathology   Current Medications, Allergies, Past Medical History, Past Surgical History, Family History and Social History were reviewed in [employer] record.     Current Outpatient Medications  Medication Sig Dispense Refill   Cholecalciferol (VITAMIN D3) 25 MCG (1000 UT) CAPS Take 1 capsule by mouth daily.     clobetasol (OLUX) 0.05 % topical foam Apply topically 2 (two) times daily. 50 g 5   Cyanocobalamin (VITAMIN B12) 500 MCG TABS Take 1 tablet by mouth daily.     Dexlansoprazole (DEXILANT) 30 MG capsule Take 1 capsule (30 mg total) by mouth daily. 30 capsule 3   halobetasol (ULTRAVATE) 0.05 % cream Apply topically.     hydrocortisone valerate cream (WESTCORT) 0.2 % Apply topically.     Hyoscyamine Sulfate SL (LEVSIN/SL) 0.125 MG SUBL Place 1 tablet under the tongue daily. As needed 30 tablet 3   ketoconazole (NIZORAL) 2 % shampoo Shampoo into the scalp let sit 5-10 minutes then wash out. Use 3 days per week. 120 mL 3   levothyroxine (SYNTHROID, LEVOTHROID) 50 MCG tablet Take 50 mcg 3 times a week (Mon, Wed, Fri),25 mcg 4 times a week (Tues, Thurs, Sat, Sun)      mupirocin ointment (BACTROBAN) 2 % Apply 1 application topically daily. 22 g 0   polyethylene glycol powder (GLYCOLAX/MIRALAX) 17 GM/SCOOP powder Take 17 g by mouth as needed.     sucralfate (CARAFATE) 1 g tablet Take 1 tablet (1 g total) by mouth 2 (two) times daily. As needed 60 tablet 3   triamcinolone cream (KENALOG) 0.1 % Apply topically.     valACYclovir (VALTREX) 1000 MG tablet Take 2 tablets by mouth at first onset of symptoms of cold sores, then take 2 tablets by mouth 12 hours later. 30 tablet 1   No current facility-administered medications for this visit.    Review of Systems: No chest pain. No shortness of breath. No urinary complaints.   PHYSICAL EXAM :    Wt Readings from Last 3 Encounters:  05/29/21 148 lb 2 oz (67.2 kg)  05/29/21 148 lb (67.1 kg)  02/20/21 154 lb (69.9 kg)    BP 128/67   Pulse 67   Ht 5\' 4"  (1.626 m)   Wt 148 lb 2 oz (67.2 kg)   LMP  07/22/1989 (Approximate)   SpO2 97%   BMI 25.43 kg/m  Constitutional:  Generally well appearing female in no acute distress. Psychiatric: Pleasant. Normal mood and affect. Behavior is normal. EENT: Pupils normal.  Conjunctivae are normal. No scleral icterus. Neck supple.  Cardiovascular: Normal rate, regular rhythm. No edema Pulmonary/chest: Effort normal and breath sounds normal. No wheezing, rales or rhonchi. Abdominal: Soft, nondistended, nontender. Bowel sounds active throughout. There are no masses palpable. No hepatomegaly. Neurological: Alert and oriented to person place and time. Skin: Skin is warm and dry. No rashes noted.  [name], NP  05/29/2021, 3:27 PM  Cc:  Tower, Wynelle Fanny, MD

## 2021-05-29 NOTE — Progress Notes (Signed)
Per Hanley Seamen RN ok to place IV in patient for CT scan, patient advised that if she is to leave without being seen to please notify the nursing staff.  Patient stated she understood

## 2021-05-30 ENCOUNTER — Telehealth: Payer: Self-pay | Admitting: Nurse Practitioner

## 2021-05-30 ENCOUNTER — Telehealth: Payer: Self-pay

## 2021-05-30 NOTE — Telephone Encounter (Signed)
Thank you :)

## 2021-05-30 NOTE — Progress Notes (Signed)
Reviewed and agree with documentation and assessment and plan. K. Veena Harnoor Kohles , MD   

## 2021-05-30 NOTE — Telephone Encounter (Signed)
Patient called and wanted to know if her gastroenterologist could place order for labs we wanted to order along with labs they would be ordering tomorrow at her appointment with them. I spoke with patient and gave her the name of the labs needed, as well as telling her where her gastroenterologist could find the order in [place]. Advised patient to let us know if she needed anything further./js

## 2021-05-30 NOTE — Telephone Encounter (Signed)
Transferrin sat and Iron were ordered by Dr Laurence Ferrari following up on the elevated ferritin. You noted this when you saw the patient for abdominal pain on 05/29/21. The patient is coming in tomorrow to have her LFT drawn here at our lab. Can I order the transferrin saturation and iron in your name? Otherwise the patient will have 2 separate labs draws at 2 differnet labs tomorrow. She is asking.

## 2021-05-30 NOTE — Telephone Encounter (Signed)
Inbound call from patient requesting to speak with a nurse please in regards to upcoming labs.

## 2021-05-31 ENCOUNTER — Other Ambulatory Visit (INDEPENDENT_AMBULATORY_CARE_PROVIDER_SITE_OTHER): Payer: PPO

## 2021-05-31 ENCOUNTER — Other Ambulatory Visit: Payer: Self-pay

## 2021-05-31 DIAGNOSIS — R7989 Other specified abnormal findings of blood chemistry: Secondary | ICD-10-CM

## 2021-05-31 DIAGNOSIS — R109 Unspecified abdominal pain: Secondary | ICD-10-CM

## 2021-05-31 LAB — HEPATIC FUNCTION PANEL
ALT: 71 U/L — ABNORMAL HIGH (ref 0–35)
AST: 30 U/L (ref 0–37)
Albumin: 4.1 g/dL (ref 3.5–5.2)
Alkaline Phosphatase: 268 U/L — ABNORMAL HIGH (ref 39–117)
Bilirubin, Direct: 0.5 mg/dL — ABNORMAL HIGH (ref 0.0–0.3)
Total Bilirubin: 1.4 mg/dL — ABNORMAL HIGH (ref 0.2–1.2)
Total Protein: 6.3 g/dL (ref 6.0–8.3)

## 2021-05-31 LAB — IRON: Iron: 63 ug/dL (ref 42–145)

## 2021-05-31 NOTE — Telephone Encounter (Signed)
Labs ordered. Patient will come to the lab this afternoon.

## 2021-06-04 DIAGNOSIS — M5416 Radiculopathy, lumbar region: Secondary | ICD-10-CM | POA: Diagnosis not present

## 2021-06-05 ENCOUNTER — Encounter: Payer: Self-pay | Admitting: Family Medicine

## 2021-06-05 LAB — TRANSFERRIN SATURATION
IRON SATN MFR SERPL: 21 %{saturation}
IRON SERPL-MCNC: 62 ug/dL
TRANSFERRIN SERPL-MCNC: 216 mg/dL

## 2021-06-06 ENCOUNTER — Telehealth: Payer: Self-pay

## 2021-06-06 ENCOUNTER — Other Ambulatory Visit: Payer: Self-pay

## 2021-06-06 ENCOUNTER — Ambulatory Visit
Admission: RE | Admit: 2021-06-06 | Discharge: 2021-06-06 | Disposition: A | Discharge status: Home / Self Care | Payer: PPO | Source: Ambulatory Visit | Attending: Nurse Practitioner | Admitting: Nurse Practitioner

## 2021-06-06 DIAGNOSIS — R7989 Other specified abnormal findings of blood chemistry: Secondary | ICD-10-CM | POA: Diagnosis not present

## 2021-06-06 DIAGNOSIS — K8020 Calculus of gallbladder without cholecystitis without obstruction: Secondary | ICD-10-CM | POA: Diagnosis not present

## 2021-06-06 DIAGNOSIS — R109 Unspecified abdominal pain: Secondary | ICD-10-CM | POA: Insufficient documentation

## 2021-06-06 NOTE — Telephone Encounter (Signed)
Patient did have additional labs done and these are in. They went Morgan [name] instead of you though due to patient being at that office for lab work and they were just added on to keep patient from going multiple places. Patient asked that you please review these and we call her back Thursday.

## 2021-06-07 NOTE — Telephone Encounter (Signed)
Patient has been advised of lab work. She will contact her PCP to advise them of results as well.

## 2021-06-07 NOTE — Telephone Encounter (Signed)
Thank you! I signed off and added a message under her results so it would be linked to her last results.

## 2021-06-08 ENCOUNTER — Telehealth: Payer: Self-pay | Admitting: Nurse Practitioner

## 2021-06-08 NOTE — Telephone Encounter (Signed)
Inbound call from patient requesting a call back with [place] results.

## 2021-06-13 NOTE — Telephone Encounter (Signed)
Patient reports she has had 3 "bad attacks" since she was seen. She is interested in seeing a surgeon if Dr Silverio Decamp agrees.

## 2021-06-13 NOTE — Telephone Encounter (Signed)
Inbound call from patient, requesting [place] results.

## 2021-06-18 ENCOUNTER — Telehealth: Payer: Self-pay | Admitting: Family Medicine

## 2021-06-18 NOTE — Chronic Care Management (AMB) (Signed)
  Chronic Care Management   Outreach Note  06/18/2021 Name: Morgan [name] MRN: [id number] DOB: [birth year]  Referred by: Tower, Wynelle Fanny, MD Reason for referral : No chief complaint on file.   An unsuccessful telephone outreach was attempted today. The patient was referred to the pharmacist for assistance with care management and care coordination.   Follow Up Plan:   Tatjana Dellinger Upstream Scheduler

## 2021-06-19 NOTE — Progress Notes (Signed)
Agree with referral to [facility] for evaluation for possible cholecystectomy.

## 2021-06-19 NOTE — Progress Notes (Signed)
Agree with referral to [facility] or evaluation and possible cholecystectomy

## 2021-06-21 ENCOUNTER — Telehealth: Payer: Self-pay | Admitting: Nurse Practitioner

## 2021-06-21 DIAGNOSIS — M4317 Spondylolisthesis, lumbosacral region: Secondary | ICD-10-CM | POA: Diagnosis not present

## 2021-06-21 DIAGNOSIS — M5416 Radiculopathy, lumbar region: Secondary | ICD-10-CM | POA: Diagnosis not present

## 2021-06-21 DIAGNOSIS — M545 Low back pain, unspecified: Secondary | ICD-10-CM | POA: Diagnosis not present

## 2021-06-21 DIAGNOSIS — M4697 Unspecified inflammatory spondylopathy, lumbosacral region: Secondary | ICD-10-CM | POA: Diagnosis not present

## 2021-06-21 NOTE — Telephone Encounter (Signed)
Left a message with the spouse. Her referral to the surgeon was faxed last night.

## 2021-06-21 NOTE — Telephone Encounter (Signed)
Patient called and states she was waiting on a call from you. She is wanting to know where she needs to go from here after her visit she had with Nevin Bloodgood. Please advise.

## 2021-06-25 ENCOUNTER — Other Ambulatory Visit: Payer: Self-pay

## 2021-06-25 ENCOUNTER — Encounter: Payer: Self-pay | Admitting: Family Medicine

## 2021-06-25 ENCOUNTER — Telehealth (INDEPENDENT_AMBULATORY_CARE_PROVIDER_SITE_OTHER): Payer: PPO | Admitting: Family Medicine

## 2021-06-25 VITALS — Wt 142.0 lb

## 2021-06-25 DIAGNOSIS — K8020 Calculus of gallbladder without cholecystitis without obstruction: Secondary | ICD-10-CM

## 2021-06-25 DIAGNOSIS — R7989 Other specified abnormal findings of blood chemistry: Secondary | ICD-10-CM | POA: Diagnosis not present

## 2021-06-25 DIAGNOSIS — R634 Abnormal weight loss: Secondary | ICD-10-CM

## 2021-06-25 NOTE — Assessment & Plan Note (Signed)
In setting of colicky GI symptoms (and gallstones) with nl cbc and iron  I suspect this is a reaction to inflammation moreso than an iron storage problem  That being said, will want to re check it once the GI/gallstone issues are resolved  Will continue to follow

## 2021-06-25 NOTE — Patient Instructions (Signed)
I think more than likely your ferritin is up from inflammation with GI issues  I agree with plan for general surgical referral to evaluate the gallbladder In the meantime please eat a bland (very low fat) diet  Alert [place] if symptoms return (go to ER if severe)   If there is a long wait for surgical appt we may need to re check labs

## 2021-06-25 NOTE — Progress Notes (Signed)
Virtual Visit via Video Note  I connected with Morgan [name] on 06/25/21 at  4:00 PM EST by a video enabled telemedicine application and verified that I am speaking with the correct person using two identifiers.  Location: Patient: home Provider: office   I discussed the limitations of evaluation and management by telemedicine and the availability of in person appointments. The patient expressed understanding and agreed to proceed.  Parties involved in encounter  Patient:Morgan [name]  Provider:  Loura Pardon MD   History of Present Illness: Pt presents for GI complaints and lab abnormalities  Wt Readings from Last 3 Encounters:  06/25/21 142 lb (64.4 kg)  05/29/21 148 lb 2 oz (67.2 kg)  05/29/21 148 lb (67.1 kg)   24.37 kg/m Has lost 40 lb in the past year because she cannot eat Started with loss of taste/smell with covid Then she felt bad eating -? Due to gallstones  Has seen GI ongoing for abdominal discomfort/bloating and vomiting and IBS ? Small bowel issue  Also weight loss  Had a pos SIBO test and improved with metronidazole  Has an elevated ferritin thought to be 2nd to inflammation  GI panel was negative  Iron    Lab Results  Component Value Date   IRON 63 05/31/2021   FERRITIN 473 (H) 05/17/2021   Her hair is really thinning -derm has been watching it    Had elevated liver enzymes Lab Results  Component Value Date   ALT 71 (H) 05/31/2021   AST 30 05/31/2021   ALKPHOS 268 (H) 05/31/2021   BILITOT 1.4 (H) 05/31/2021   Upper GI series with small bowel follow through and abd [place] were ordered CT was prev neg Lab Results  Component Value Date   CREATININE 0.55 05/29/2021   BUN 9 05/29/2021   NA 135 05/29/2021   K 4.1 05/29/2021   CL 101 05/29/2021   CO2 26 05/29/2021   Lab Results  Component Value Date   WBC 7.0 05/29/2021   HGB 12.3 05/29/2021   HCT 37.3 05/29/2021   MCV 92.6 05/29/2021   PLT 199 05/29/2021      CT ABDOMEN PELVIS W  CONTRAST  Result Date: 05/29/2021 CLINICAL DATA:  Acute abdominal pain, nonlocalized. Constipation, history of small-bowel obstruction EXAM: CT ABDOMEN AND PELVIS WITH CONTRAST TECHNIQUE: Multidetector CT imaging of the abdomen and pelvis was performed using the standard protocol following bolus administration of intravenous contrast. CONTRAST:  86mL OMNIPAQUE IOHEXOL 350 MG/ML SOLN COMPARISON:  CT examination dated September 21, 2020 FINDINGS: Lower chest: No acute abnormality. Hepatobiliary: No focal liver abnormality is seen. Gallbladder is distended with bile. No gallbladder wall thickening or evidence of gallstones. No pericholecystic inflammatory changes. No biliary ductal dilatation. Pancreas: Unremarkable. No pancreatic ductal dilatation or surrounding inflammatory changes. Spleen: Normal in size without focal abnormality. Adrenals/Urinary Tract: Adrenal glands are unremarkable. Kidneys are normal, without renal calculi, focal lesion, or hydronephrosis. Bladder is unremarkable. Stomach/Bowel: Bowel loops are normal in caliber. Scattered colonic diverticuli without evidence of acute diverticulitis. Appendix not visualized. Vascular/Lymphatic: Aortic atherosclerosis. No enlarged abdominal or pelvic lymph nodes. Reproductive: Uterus and bilateral adnexa are unremarkable. Other: Fat containing umbilical hernia. Musculoskeletal: Multilevel degenerative disc disease of the lumbar spine prominent at at L2-L3 and L5-S1 with grade 1 anterolisthesis at L5-S1. IMPRESSION: No CT evidence of acute abdominal/pelvic process or significant interval change. Electronically Signed   By: Keane Police D.O.   On: 05/29/2021 11:03   US ABDOMEN LIMITED RUQ (LIVER/GB)  Result Date:  06/06/2021 CLINICAL DATA:  Elevated liver function tests. EXAM: ULTRASOUND ABDOMEN LIMITED RIGHT UPPER QUADRANT COMPARISON:  None. FINDINGS: Gallbladder: A 4.3 mm mobile echogenic gallstone is seen within the gallbladder lumen. There is no evidence of  gallbladder wall thickening (2.7 mm) no sonographic Murphy sign noted by sonographer. Common bile duct: Diameter: 7.0 mm Liver: No focal lesion identified. Within normal limits in parenchymal echogenicity. Portal vein is patent on color Doppler imaging with normal direction of blood flow towards the liver. Other: None. IMPRESSION: Cholelithiasis without evidence of acute cholecystitis. Electronically Signed   By: Virgina Norfolk M.D.   On: 06/06/2021 21:39    She was referred to gen surg  (she prefers Dr Donne Hazel)  Still waiting on her appt   Also bothered by pinched nerve in back (a shot helped quite a lot)  Has f/u for that   No more attacks since last one Has appt here in February   No etoh  Normally does not take tylenol  Occ ibuprofen    Patient Active Problem List   Diagnosis Date Noted   Elevated ferritin 06/25/2021   Gallstones 06/25/2021   Elevated LFTs 06/25/2021   COVID-19 virus infection 02/20/2021   Current use of proton pump inhibitor 07/04/2020   Change in stool caliber 07/04/2020   Idiopathic small fiber sensory neuropathy 04/12/2020   Chest tightness 02/25/2020   Belching 02/09/2020   Vitamin D deficiency 10/05/2019   Neck pain 03/02/2019   Colon cancer screening 02/09/2019   Snoring 08/06/2017   Nasal congestion 08/06/2017   Paresthesia of foot, bilateral 08/12/2016   Encounter for Medicare annual wellness exam 04/13/2014   Allergic rhinitis 04/13/2014   Routine general medical examination at a health care facility 04/07/2014   Multiple thyroid nodules 01/24/2014   History of small bowel obstruction 01/20/2014   DDD (degenerative disc disease), cervical    DDD (degenerative disc disease), lumbar    Spinal stenosis    Obese    TINNITUS 06/08/2010   NONSPECIFIC ABNORMAL TOXICOLOGICAL FINDINGS 05/11/2010   Hypothyroidism 04/18/2010   BRADYCARDIA 05/31/2008   GERD 05/03/2008   Dyspepsia 05/03/2008   DIVERTICULOSIS OF COLON 04/26/2008   HEPATITIS, HX OF  04/26/2008   COLONIC POLYPS, HYPERPLASTIC, HX OF 04/26/2008   HEMORRHOIDS, INTERNAL 01/11/2008   DERMATITIS, SEBORRHEIC NEC 01/28/2007   Hyperlipidemia 01/19/2007   IBS 01/19/2007   OVERACTIVE BLADDER 01/19/2007   POSTMENOPAUSAL STATUS 01/19/2007   ECZEMA 01/19/2007   Osteoarthritis 01/19/2007   ALLERGY 01/19/2007   Past Medical History:  Diagnosis Date   Allergy    Bradycardia    Collagen vascular disease ([place])    Connective tissue disease ([place])    COVID-19    DDD (degenerative disc disease), cervical    DDD (degenerative disc disease), lumbar    Endometrial polyp    not resolved with D&C (following)   GERD (gastroesophageal reflux disease)    gas/belching issues-was on protonix for short time-not since 6 months   HLD (hyperlipidemia)    Mixed connective tissue disease ([place]) diagnosed 5 years ago   no problems since diagnosis   Multiple thyroid nodules 01/24/2014   Obesity    Osteoarthritis of cervical spine    PONV (postoperative nausea and vomiting)    Renal calculi    Spinal stenosis    Past Surgical History:  Procedure Laterality Date   APPENDECTOMY     COLONOSCOPY     DILATION AND CURETTAGE OF UTERUS     fibroids   DILATION AND CURETTAGE OF  UTERUS  01/2005   endometrial polyp   ENDOMETRIAL BIOPSY  2008   fibroid   HAND SURGERY     carpal tunnel release bilateral Dr. Daylene Katayama   hysteroscopic resection      polyps of PMB   TONSILLECTOMY     TUBAL LIGATION     1973   UPPER GASTROINTESTINAL ENDOSCOPY     Social History   Tobacco Use   Smoking status: Former    Types: Cigarettes    Quit date: 07/22/1965    Years since quitting: 55.9   Smokeless tobacco: Never  Vaping Use   Vaping Use: Never used  Substance Use Topics   Alcohol use: No   Drug use: No   Family History  Problem Relation Age of Onset   Coronary artery disease Father    Heart attack Father        deceased at 28   Dementia Mother    Diabetes Maternal Grandmother    Goiter Maternal  Grandmother    Diabetes Cousin    Cancer Grandchild        rare type (dying at age 21)   Colon cancer Neg Hx    Esophageal cancer Neg Hx    Stomach cancer Neg Hx    Rectal cancer Neg Hx    Colon polyps Neg Hx    Allergies  Allergen Reactions   Codeine Nausea And Vomiting   Hydrocodone-Acetaminophen     REACTION: reaction not known   Penicillins Hives   Current Outpatient Medications on File Prior to Visit  Medication Sig Dispense Refill   Cholecalciferol (VITAMIN D3) 25 MCG (1000 UT) CAPS Take 1 capsule by mouth daily.     clobetasol (OLUX) 0.05 % topical foam Apply topically 2 (two) times daily. (Patient taking differently: Apply topically daily as needed.) 50 g 5   Cyanocobalamin (VITAMIN B12) 500 MCG TABS Take 1 tablet by mouth daily.     halobetasol (ULTRAVATE) 0.05 % cream Apply topically.     hydrocortisone valerate cream (WESTCORT) 0.2 % Apply topically.     ketoconazole (NIZORAL) 2 % shampoo Shampoo into the scalp let sit 5-10 minutes then wash out. Use 3 days per week. 120 mL 3   levothyroxine (SYNTHROID, LEVOTHROID) 50 MCG tablet Take 50 mcg 3 times a week (Mon, Wed, Fri),25 mcg 4 times a week (Tues, Thurs, Sat, Sun)     mupirocin ointment (BACTROBAN) 2 % Apply 1 application topically daily. 22 g 0   polyethylene glycol powder (GLYCOLAX/MIRALAX) 17 GM/SCOOP powder Take 17 g by mouth as needed.     sucralfate (CARAFATE) 1 g tablet Take 1 tablet (1 g total) by mouth 2 (two) times daily. As needed 60 tablet 3   triamcinolone cream (KENALOG) 0.1 % Apply topically.     valACYclovir (VALTREX) 1000 MG tablet Take 2 tablets by mouth at first onset of symptoms of cold sores, then take 2 tablets by mouth 12 hours later. 30 tablet 1   No current facility-administered medications on file prior to visit.    Observations/Objective: Patient appears well, in no distress Weight is down from baseline No facial swelling or asymmetry Normal voice-not hoarse and no slurred speech No  obvious tremor or mobility impairment Moving neck and UEs normally Able to hear the call well  No cough or shortness of breath during interview  Talkative and mentally sharp with no cognitive changes No skin changes on face or neck , no rash or pallor Affect is normal  Assessment and Plan: Problem List Items Addressed This Visit       Digestive   Gallstones - Primary    I suspect this may be the cause of her intermittent colicky GI problems and LFT elevation  Rev GI notes and [place] and CT as well as labs today  She is waiting on word from GI regarding ref to [facility] for surgical opinion  In the meantime I recommend eating a low fat/bland diet and watching closely for symptoms  If symptoms return (ER precautions discussed) she will contact us  If there is a long wait for surgical appt then consider re checking LFTs to make sure they are going down        Other   Elevated ferritin    In setting of colicky GI symptoms (and gallstones) with nl cbc and iron  I suspect this is a reaction to inflammation moreso than an iron storage problem  That being said, will want to re check it once the GI/gallstone issues are resolved  Will continue to follow      Elevated LFTs    Acutely elevated on 11/8 during GI distress then trending down on 11/10 Gallstones found (may have passed one?)  Agree with surgical referral  Will need to follow - plan to re check before appt if it takes a while to get in      Weight loss    In setting of reduced GI intake First- lost taste/smell from covid Now-GI issues (possibly from gallstones) This needs to be monitored after/during GI and surgical eval        Follow Up Instructions:   I think more than likely your ferritin is up from inflammation with GI issues  I agree with plan for general surgical referral to evaluate the gallbladder In the meantime please eat a bland (very low fat) diet  Alert [place] if symptoms return (go to ER if severe)   If there  is a long wait for surgical appt we may need to re check labs  I discussed the assessment and treatment plan with the patient. The patient was provided an opportunity to ask questions and all were answered. The patient agreed with the plan and demonstrated an understanding of the instructions.   The patient was advised to call back or seek an in-person evaluation if the symptoms worsen or if the condition fails to improve as anticipated.    Loura Pardon, MD

## 2021-06-25 NOTE — Assessment & Plan Note (Signed)
Acutely elevated on 11/8 during GI distress then trending down on 11/10 Gallstones found (may have passed one?)  Agree with surgical referral  Will need to follow - plan to re check before appt if it takes a while to get in

## 2021-06-25 NOTE — Assessment & Plan Note (Signed)
I suspect this may be the cause of her intermittent colicky GI problems and LFT elevation  Rev GI notes and [place] and CT as well as labs today  She is waiting on word from GI regarding ref to [facility] for surgical opinion  In the meantime I recommend eating a low fat/bland diet and watching closely for symptoms  If symptoms return (ER precautions discussed) she will contact us  If there is a long wait for surgical appt then consider re checking LFTs to make sure they are going down

## 2021-06-25 NOTE — Assessment & Plan Note (Signed)
In setting of reduced GI intake First- lost taste/smell from covid Now-GI issues (possibly from gallstones) This needs to be monitored after/during GI and surgical eval

## 2021-07-25 ENCOUNTER — Other Ambulatory Visit: Payer: Self-pay | Admitting: Surgery

## 2021-07-25 DIAGNOSIS — K8020 Calculus of gallbladder without cholecystitis without obstruction: Secondary | ICD-10-CM | POA: Diagnosis not present

## 2021-08-07 ENCOUNTER — Other Ambulatory Visit: Payer: Self-pay | Admitting: Dermatology

## 2021-08-13 ENCOUNTER — Encounter (HOSPITAL_BASED_OUTPATIENT_CLINIC_OR_DEPARTMENT_OTHER): Payer: Self-pay | Admitting: Surgery

## 2021-08-13 ENCOUNTER — Other Ambulatory Visit: Payer: Self-pay

## 2021-08-15 ENCOUNTER — Telehealth: Payer: Self-pay | Admitting: Family Medicine

## 2021-08-15 ENCOUNTER — Encounter: Payer: Self-pay | Admitting: Family Medicine

## 2021-08-15 ENCOUNTER — Other Ambulatory Visit: Payer: Self-pay

## 2021-08-15 ENCOUNTER — Telehealth (INDEPENDENT_AMBULATORY_CARE_PROVIDER_SITE_OTHER): Payer: PPO | Admitting: Family Medicine

## 2021-08-15 ENCOUNTER — Ambulatory Visit: Payer: PPO

## 2021-08-15 VITALS — BP 167/84 | HR 58 | Ht 64.0 in | Wt 142.0 lb

## 2021-08-15 DIAGNOSIS — Z79899 Other long term (current) drug therapy: Secondary | ICD-10-CM

## 2021-08-15 DIAGNOSIS — E559 Vitamin D deficiency, unspecified: Secondary | ICD-10-CM

## 2021-08-15 DIAGNOSIS — R7989 Other specified abnormal findings of blood chemistry: Secondary | ICD-10-CM

## 2021-08-15 DIAGNOSIS — E039 Hypothyroidism, unspecified: Secondary | ICD-10-CM | POA: Diagnosis not present

## 2021-08-15 DIAGNOSIS — K8020 Calculus of gallbladder without cholecystitis without obstruction: Secondary | ICD-10-CM

## 2021-08-15 DIAGNOSIS — E7800 Pure hypercholesterolemia, unspecified: Secondary | ICD-10-CM

## 2021-08-15 DIAGNOSIS — Z0000 Encounter for general adult medical examination without abnormal findings: Secondary | ICD-10-CM

## 2021-08-15 NOTE — Assessment & Plan Note (Signed)
With elevated LFTs Planning gallbladder surgery in a week   No abd pain currently  Per pt stools are lighter in color as well  Enc to keep [place] and surgeon posted if any changes  Will want to re check liver tests after surgery

## 2021-08-15 NOTE — Assessment & Plan Note (Signed)
Lab Results  Component Value Date   TSH 0.86 07/31/2020   Pt is troubled with hair loss and wt loss  Enc her to f/u with endocrinology (Dr Ronnald Collum) to discuss once her gallbladder surgery is done

## 2021-08-15 NOTE — Assessment & Plan Note (Signed)
Lab Results  Component Value Date   IRON 63 05/31/2021   FERRITIN 473 (H) 05/17/2021   with nl Hb of 12.3 at last labs Pt is worried about this  Unsure of etiology  ? If possibly related to inflammation Planning gallbladder surgery soon and then would like to re visit it  req a referral to hematology in [place]

## 2021-08-15 NOTE — Progress Notes (Signed)
Virtual Visit via Video Note  I connected with [name] on 08/15/21 at  2:30 PM EST by a video enabled telemedicine application and verified that I am speaking with the correct person using two identifiers.  Location: Patient: home Provider: office    I discussed the limitations of evaluation and management by telemedicine and the availability of in person appointments. The patient expressed understanding and agreed to proceed.  Parties involved in encounter  Patient: Morgan [name]   Provider:  Loura Pardon MD   History of Present Illness: Pt presents to discuss labs/hematology referral and trouble sleeping   Would like to change endorinology care   Planning [facility] on feb 1st  Lab Results  Component Value Date   ALT 71 (H) 05/31/2021   AST 30 05/31/2021   ALKPHOS 268 (H) 05/31/2021   BILITOT 1.4 (H) 05/31/2021   Had to re schedule PE for march  Would like pap/gyn exam at this time    Elevated ferritin  Lab Results  Component Value Date   WBC 7.0 05/29/2021   HGB 12.3 05/29/2021   HCT 37.3 05/29/2021   MCV 92.6 05/29/2021   PLT 199 05/29/2021   Lab Results  Component Value Date   IRON 63 05/31/2021   FERRITIN 473 (H) 05/17/2021    Wants a referral to hematology  She read about it and very worried about it  Hair is very think  Has lost 45 lb    Hypothyroid Lab Results  Component Value Date   TSH 0.86 07/31/2020  Has seen Dr Brayton El -endocrinology  Unsure if she wants to stay on levothyroxine  Hair loss concerns her as well as fatigue and weight loss   Patient Active Problem List   Diagnosis Date Noted   Elevated ferritin 06/25/2021   Gallstones 06/25/2021   Elevated LFTs 06/25/2021   Weight loss 06/25/2021   COVID-19 virus infection 02/20/2021   Current use of proton pump inhibitor 07/04/2020   Change in stool caliber 07/04/2020   Idiopathic small fiber sensory neuropathy 04/12/2020   Chest tightness 02/25/2020   Belching 02/09/2020   Vitamin  D deficiency 10/05/2019   Neck pain 03/02/2019   Colon cancer screening 02/09/2019   Snoring 08/06/2017   Nasal congestion 08/06/2017   Paresthesia of foot, bilateral 08/12/2016   Encounter for Medicare annual wellness exam 04/13/2014   Allergic rhinitis 04/13/2014   Routine general medical examination at a health care facility 04/07/2014   Multiple thyroid nodules 01/24/2014   History of small bowel obstruction 01/20/2014   DDD (degenerative disc disease), cervical    DDD (degenerative disc disease), lumbar    Spinal stenosis    Obese    TINNITUS 06/08/2010   NONSPECIFIC ABNORMAL TOXICOLOGICAL FINDINGS 05/11/2010   Hypothyroidism 04/18/2010   BRADYCARDIA 05/31/2008   GERD 05/03/2008   Dyspepsia 05/03/2008   DIVERTICULOSIS OF COLON 04/26/2008   HEPATITIS, HX OF 04/26/2008   COLONIC POLYPS, HYPERPLASTIC, HX OF 04/26/2008   HEMORRHOIDS, INTERNAL 01/11/2008   DERMATITIS, SEBORRHEIC NEC 01/28/2007   Hyperlipidemia 01/19/2007   IBS 01/19/2007   OVERACTIVE BLADDER 01/19/2007   POSTMENOPAUSAL STATUS 01/19/2007   ECZEMA 01/19/2007   Osteoarthritis 01/19/2007   ALLERGY 01/19/2007   Past Medical History:  Diagnosis Date   Allergy    Bradycardia    Collagen vascular disease ([place])    Connective tissue disease ([place])    COVID-19    DDD (degenerative disc disease), cervical    DDD (degenerative disc disease), lumbar    Endometrial  polyp    not resolved with D&C (following)   GERD (gastroesophageal reflux disease)    gas/belching issues-was on protonix for short time-not since 6 months   HLD (hyperlipidemia)    Mixed connective tissue disease ([place]) diagnosed 5 years ago   no problems since diagnosis   Multiple thyroid nodules 01/24/2014   Obesity    Osteoarthritis of cervical spine    PONV (postoperative nausea and vomiting)    Renal calculi    Spinal stenosis    Past Surgical History:  Procedure Laterality Date   APPENDECTOMY     COLONOSCOPY     DILATION AND CURETTAGE OF  UTERUS     fibroids   DILATION AND CURETTAGE OF UTERUS  01/2005   endometrial polyp   ENDOMETRIAL BIOPSY  2008   fibroid   HAND SURGERY     carpal tunnel release bilateral Dr. Daylene Katayama   hysteroscopic resection      polyps of PMB   TONSILLECTOMY     TUBAL LIGATION     1973   UPPER GASTROINTESTINAL ENDOSCOPY     Social History   Tobacco Use   Smoking status: Former    Types: Cigarettes    Quit date: 07/22/1965    Years since quitting: 56.1   Smokeless tobacco: Never  Vaping Use   Vaping Use: Never used  Substance Use Topics   Alcohol use: No   Drug use: No   Family History  Problem Relation Age of Onset   Coronary artery disease Father    Heart attack Father        deceased at 21   Dementia Mother    Diabetes Maternal Grandmother    Goiter Maternal Grandmother    Diabetes Cousin    Cancer Grandchild        rare type (dying at age 16)   Colon cancer Neg Hx    Esophageal cancer Neg Hx    Stomach cancer Neg Hx    Rectal cancer Neg Hx    Colon polyps Neg Hx    Allergies  Allergen Reactions   Codeine Nausea And Vomiting   Hydrocodone-Acetaminophen     REACTION: reaction not known   Penicillins Hives   Current Outpatient Medications on File Prior to Visit  Medication Sig Dispense Refill   Cholecalciferol (VITAMIN D3) 25 MCG (1000 UT) CAPS Take 1 capsule by mouth daily.     clobetasol (OLUX) 0.05 % topical foam Apply topically 2 (two) times daily. (Patient taking differently: Apply topically daily as needed.) 50 g 5   Cyanocobalamin (VITAMIN B12) 500 MCG TABS Take 1 tablet by mouth daily.     halobetasol (ULTRAVATE) 0.05 % cream APPLY TOPICALLY 2 TIMES DAILY AS NEEDED FOR UP TO 14 DAYS. AVOID FACE, GROIN ANDAXILLA 30 g 0   hydrocortisone valerate cream (WESTCORT) 0.2 % Apply topically.     levothyroxine (SYNTHROID, LEVOTHROID) 50 MCG tablet Take 50 mcg 3 times a week (Mon, Wed, Fri),25 mcg 4 times a week (Tues, Thurs, Sat, Sun)     polyethylene glycol powder  (GLYCOLAX/MIRALAX) 17 GM/SCOOP powder Take 17 g by mouth as needed.     triamcinolone cream (KENALOG) 0.1 % Apply topically.     valACYclovir (VALTREX) 1000 MG tablet Take 2 tablets by mouth at first onset of symptoms of cold sores, then take 2 tablets by mouth 12 hours later. 30 tablet 1   No current facility-administered medications on file prior to visit.   Review of Systems  Constitutional:  Positive for malaise/fatigue. Negative for chills and fever.  HENT:  Negative for congestion, ear pain, sinus pain and sore throat.   Eyes:  Negative for blurred vision, discharge and redness.  Respiratory:  Negative for cough, shortness of breath and stridor.   Cardiovascular:  Negative for chest pain, palpitations and leg swelling.  Gastrointestinal:  Negative for abdominal pain, diarrhea, nausea and vomiting.       No abdominal pain today  Stools are lighter in color lately   Musculoskeletal:  Negative for myalgias.  Skin:  Negative for rash.  Neurological:  Negative for dizziness and headaches.  Endo/Heme/Allergies:  Does not bruise/bleed easily.   Observations/Objective:  Patient appears well, in no distress Weight is down  No facial swelling or asymmetry Normal voice-not hoarse and no slurred speech No obvious tremor or mobility impairment Moving neck and UEs normally Able to hear the call well  No cough or shortness of breath during interview  Talkative and mentally sharp with no cognitive changes No skin changes on face or neck , no rash or pallor Affect is normal   Assessment and Plan: Problem List Items Addressed This Visit       Digestive   Gallstones    With elevated LFTs Planning gallbladder surgery in a week   No abd pain currently  Per pt stools are lighter in color as well  Enc to keep [place] and surgeon posted if any changes  Will want to re check liver tests after surgery         Endocrine   Hypothyroidism    Lab Results  Component Value Date   TSH 0.86  07/31/2020  Pt is troubled with hair loss and wt loss  Enc her to f/u with endocrinology (Dr Ronnald Collum) to discuss once her gallbladder surgery is done        Other   Elevated ferritin - Primary    Lab Results  Component Value Date   IRON 63 05/31/2021   FERRITIN 473 (H) 05/17/2021   with nl Hb of 12.3 at last labs Pt is worried about this  Unsure of etiology  ? If possibly related to inflammation Planning gallbladder surgery soon and then would like to re visit it  req a referral to hematology in [facility]       Relevant Orders   Ambulatory referral to Hematology / Oncology     Follow Up Instructions: I will place a hematology referral for elevated ferritin  You will get a call about that   Go forward with the gallbladder surgery   We can re check liver tests, cbc and ferritin after that /when you have recovered  I recommend touching base with your endocrinologist about thyroid questions    I discussed the assessment and treatment plan with the patient. The patient was provided an opportunity to ask questions and all were answered. The patient agreed with the plan and demonstrated an understanding of the instructions.   The patient was advised to call back or seek an in-person evaluation if the symptoms worsen or if the condition fails to improve as anticipated.     Loura Pardon, MD

## 2021-08-15 NOTE — Patient Instructions (Signed)
I will place a hematology referral for elevated ferritin  You will get a call about that   Go forward with the gallbladder surgery   We can re check liver tests, cbc and ferritin after that /when you have recovered  I recommend touching base with your endocrinologist about thyroid questions

## 2021-08-15 NOTE — Telephone Encounter (Signed)
-----   Message from Ellamae Sia sent at 07/30/2021  8:37 AM EST ----- Regarding: Lab orders for Thursday, 1.26.23 Patient is scheduled for CPX labs, please order future labs, Thanks , Karna Christmas

## 2021-08-16 ENCOUNTER — Telehealth: Payer: Self-pay | Admitting: Family Medicine

## 2021-08-16 ENCOUNTER — Other Ambulatory Visit: Payer: PPO

## 2021-08-16 MED ORDER — ENSURE PRE-SURGERY PO LIQD: 296.0000 mL | Freq: Once | ORAL | Status: DC

## 2021-08-16 NOTE — Progress Notes (Signed)

## 2021-08-16 NOTE — Telephone Encounter (Signed)
Left VM letting pt know Dr. Marliss Coots comments and requested pt call back and schedule an appt with PCP to discuss sleep issues

## 2021-08-16 NOTE — Telephone Encounter (Signed)
Pt called asking for a call back to discuss what medication she can take that is over the counter to help her sleep.  Pt states that if she does not answer you can leave a detailed message. Please advise.

## 2021-08-16 NOTE — Telephone Encounter (Signed)
There are few to no sleep meds safe at her age unfortunately. I would need a detailed visit to trouble shoot.  Please schedule visit

## 2021-08-17 ENCOUNTER — Telehealth: Payer: Self-pay | Admitting: Physician Assistant

## 2021-08-17 NOTE — Telephone Encounter (Signed)
Scheduled appt for new patient on 09/18/21 - patient wanted to wait til after her gall bladder surgery to come in.  Patient is aware of appt date and time

## 2021-08-21 ENCOUNTER — Encounter (HOSPITAL_BASED_OUTPATIENT_CLINIC_OR_DEPARTMENT_OTHER)
Admission: RE | Admit: 2021-08-21 | Discharge: 2021-08-21 | Disposition: A | Discharge status: Home / Self Care | Payer: PPO | Source: Ambulatory Visit | Attending: Surgery | Admitting: Surgery

## 2021-08-21 DIAGNOSIS — K8020 Calculus of gallbladder without cholecystitis without obstruction: Secondary | ICD-10-CM | POA: Diagnosis present

## 2021-08-21 DIAGNOSIS — K8010 Calculus of gallbladder with chronic cholecystitis without obstruction: Secondary | ICD-10-CM | POA: Diagnosis not present

## 2021-08-21 LAB — COMPREHENSIVE METABOLIC PANEL
ALT: 12 U/L (ref 0–44)
AST: 19 U/L (ref 15–41)
Albumin: 3.8 g/dL (ref 3.5–5.0)
Alkaline Phosphatase: 63 U/L (ref 38–126)
Anion gap: 6 (ref 5–15)
BUN: 11 mg/dL (ref 8–23)
CO2: 29 mmol/L (ref 22–32)
Calcium: 9.1 mg/dL (ref 8.9–10.3)
Chloride: 106 mmol/L (ref 98–111)
Creatinine, Ser: 0.9 mg/dL (ref 0.44–1.00)
GFR, Estimated: >60 mL/min (ref >60)
Glucose, Bld: 136 mg/dL — ABNORMAL HIGH (ref 70–99)
Potassium: 3.6 mmol/L (ref 3.5–5.1)
Sodium: 141 mmol/L (ref 135–145)
Total Bilirubin: 0.6 mg/dL (ref 0.3–1.2)
Total Protein: 5.7 g/dL — ABNORMAL LOW (ref 6.5–8.1)

## 2021-08-21 NOTE — H&P (Signed)
REFERRING PHYSICIAN: Ree Shay, MD  PROVIDER: Beverlee Nims, MD  MRN: [id number] DOB: Jan 04, 1946 DATE OF ENCOUNTER: 07/25/2021 Subjective   Chief Complaint: Cholelithiasis   History of Present Illness: Morgan [name] is a 76 y.o. female who is seen today as an office consultation at the request of Dr. Sheppard Penton for evaluation of Cholelithiasis .   This is a pleasant 76 year old female referred for evaluation of possible symptomatic gallstones. Back in November she had an episode of severe mid abdominal pain. She had an episode of nausea and vomiting with this. It resolved and then recurred several days later and then she finally presented to the emergency room. At that time she had a CT scan which was unremarkable. Her bilirubin however was 3.2 and she had an elevated alkaline phosphatase, AST, and ALT. She later underwent an ultrasound showing a solitary 4.3 mm gallstone and the bile duct measuring 7 mm. Repeat liver function tests showed the bilirubin had increased to 1.4.  Since that time, she has been pain-free and doing well. She has had a previous tubal ligation and incidental appendectomy. She has had 1 episode of a bowel infection several years ago. She denies jaundice. She is otherwise healthy without complaints.  Review of Systems: A complete review of systems was obtained from the patient. I have reviewed this information and discussed as appropriate with the patient. See HPI as well for other ROS.  ROS   Medical History: Past Medical History:  Diagnosis Date   Arthritis   GERD (gastroesophageal reflux disease)   Thyroid disease   There is no problem list on file for this patient.  Past Surgical History:  Procedure Laterality Date   APPENDECTOMY   LAPAROSCOPIC TUBAL LIGATION N/A    Allergies  Allergen Reactions   Codeine Nausea and Nausea And Vomiting  Unknown   Hydrocodone-Acetaminophen Nausea  Unknown REACTION: reaction not known    Penicillins Hives  Unknown   Current Outpatient Medications on File Prior to Visit  Medication Sig Dispense Refill   cholecalciferol (VITAMIN D3) 1000 unit capsule Take 1 capsule by mouth once daily   cyanocobalamin (VITAMIN B12) 500 MCG tablet Take 1 tablet by mouth once daily   levothyroxine (SYNTHROID) 50 MCG tablet Take 50 mcg 3 times a week (Mon, Wed, Fri),25 mcg 4 times a week (Tues, Thurs, Sat, Sun)   No current facility-administered medications on file prior to visit.   Family History  Problem Relation Age of Onset   Heart valve disease Father    Social History   Tobacco Use  Smoking Status Never  Smokeless Tobacco Never    Social History   Socioeconomic History   Marital status: Married  Tobacco Use   Smoking status: Never   Smokeless tobacco: Never  Vaping Use   Vaping Use: Never used  Substance and Sexual Activity   Alcohol use: Never   Drug use: Never   Objective:   Vitals:  07/25/21 1507  BP: 122/84  Pulse: 71  Temp: 36.7 C (98 F)  SpO2: 97%  Weight: 65 kg (143 lb 3.2 oz)  Height: 162.6 cm (5\' 4" )   Body mass index is 24.58 kg/m.  Physical Exam   She appears well on exam  Her abdomen is soft and nontender. There are no hernias. There is no organomegaly  Lungs clear CV RRR Neuro grossly intact  Labs, Imaging and Diagnostic Testing: I reviewed her CAT scan of the abdomen pelvis as well as her old laboratory data  Assessment and Plan:   Diagnoses and all orders for this visit:  Symptomatic cholelithiasis    Given her history, ultrasound, and elevated liver function tests this episode was from her symptomatic cholelithiasis. I discussed the reasons for this with her in detail and gave her literature regarding surgery for the gallbladder. I would recommend a laparoscopic cholecystectomy based on this. I had a long discussion with her regarding this and her previous bowel obstruction. I believe these were unrelated. We discussed the  surgical procedure in detail. I would potentially do a cholangiogram at the time of surgery. We discussed the risk which includes but is not limited to bleeding, infection, bile duct injury, bile leak, the need for further procedures, postoperative recovery she understands and wished to proceed with surgery which will be scheduled

## 2021-08-22 ENCOUNTER — Encounter: Payer: PPO | Admitting: Family Medicine

## 2021-08-22 ENCOUNTER — Other Ambulatory Visit: Payer: Self-pay

## 2021-08-22 ENCOUNTER — Encounter (HOSPITAL_BASED_OUTPATIENT_CLINIC_OR_DEPARTMENT_OTHER)
Admission: RE | Disposition: A | Discharge status: Home / Self Care | Payer: Self-pay | Source: Other Acute Inpatient Hospital | Attending: Surgery

## 2021-08-22 ENCOUNTER — Encounter (HOSPITAL_BASED_OUTPATIENT_CLINIC_OR_DEPARTMENT_OTHER): Payer: Self-pay | Admitting: Surgery

## 2021-08-22 ENCOUNTER — Ambulatory Visit (HOSPITAL_BASED_OUTPATIENT_CLINIC_OR_DEPARTMENT_OTHER): Payer: PPO | Admitting: Anesthesiology

## 2021-08-22 ENCOUNTER — Observation Stay (HOSPITAL_BASED_OUTPATIENT_CLINIC_OR_DEPARTMENT_OTHER)
Admission: RE | Admit: 2021-08-22 | Discharge: 2021-08-23 | Disposition: A | Discharge status: Home / Self Care | Payer: PPO | Source: Other Acute Inpatient Hospital | Attending: Surgery | Admitting: Surgery

## 2021-08-22 DIAGNOSIS — Z9049 Acquired absence of other specified parts of digestive tract: Secondary | ICD-10-CM

## 2021-08-22 DIAGNOSIS — K8010 Calculus of gallbladder with chronic cholecystitis without obstruction: Principal | ICD-10-CM | POA: Insufficient documentation

## 2021-08-22 DIAGNOSIS — K8020 Calculus of gallbladder without cholecystitis without obstruction: Secondary | ICD-10-CM | POA: Diagnosis not present

## 2021-08-22 HISTORY — PX: CHOLECYSTECTOMY: SHX55

## 2021-08-22 SURGERY — LAPAROSCOPIC CHOLECYSTECTOMY
Anesthesia: General | Site: Abdomen

## 2021-08-22 MED ORDER — PROPOFOL 10 MG/ML IV BOLUS
INTRAVENOUS | Status: DC | PRN
Start: 2021-08-22 — End: 2021-08-22
  Administered 2021-08-22: 40 mg via INTRAVENOUS
  Administered 2021-08-22: 100 mg via INTRAVENOUS

## 2021-08-22 MED ORDER — GLYCOPYRROLATE PF 0.2 MG/ML IJ SOSY
PREFILLED_SYRINGE | INTRAMUSCULAR | Status: AC
  Filled 2021-08-22: qty 1

## 2021-08-22 MED ORDER — FENTANYL CITRATE (PF) 100 MCG/2ML IJ SOLN
INTRAMUSCULAR | Status: DC | PRN
  Administered 2021-08-22: 25 ug via INTRAVENOUS
  Administered 2021-08-22: 50 ug via INTRAVENOUS
  Administered 2021-08-22: 25 ug via INTRAVENOUS

## 2021-08-22 MED ORDER — ONDANSETRON HCL 4 MG/2ML IJ SOLN
INTRAMUSCULAR | Status: DC | PRN
Start: 2021-08-22 — End: 2021-08-22
  Administered 2021-08-22: 4 mg via INTRAVENOUS

## 2021-08-22 MED ORDER — ONDANSETRON HCL 4 MG/2ML IJ SOLN: 4.0000 mg | Freq: Four times a day (QID) | INTRAMUSCULAR | Status: DC | PRN

## 2021-08-22 MED ORDER — SODIUM CHLORIDE 0.9 % IV SOLN: INTRAVENOUS | Status: DC

## 2021-08-22 MED ORDER — PROPOFOL 500 MG/50ML IV EMUL
INTRAVENOUS | Status: DC | PRN
  Administered 2021-08-22: 135 ug/kg/min via INTRAVENOUS

## 2021-08-22 MED ORDER — FENTANYL CITRATE (PF) 100 MCG/2ML IJ SOLN
25.0000 ug | INTRAMUSCULAR | Status: DC | PRN
  Administered 2021-08-22 (×2): 50 ug via INTRAVENOUS

## 2021-08-22 MED ORDER — FENTANYL CITRATE (PF) 100 MCG/2ML IJ SOLN
INTRAMUSCULAR | Status: AC
  Filled 2021-08-22: qty 2

## 2021-08-22 MED ORDER — ONDANSETRON 4 MG PO TBDP: 4.0000 mg | ORAL_TABLET | Freq: Four times a day (QID) | ORAL | Status: DC | PRN

## 2021-08-22 MED ORDER — CIPROFLOXACIN IN D5W 400 MG/200ML IV SOLN
400.0000 mg | INTRAVENOUS | Status: AC
  Administered 2021-08-22: 400 mg via INTRAVENOUS

## 2021-08-22 MED ORDER — CHLORHEXIDINE GLUCONATE CLOTH 2 % EX PADS: 6.0000 | MEDICATED_PAD | Freq: Once | CUTANEOUS | Status: DC

## 2021-08-22 MED ORDER — HYDROMORPHONE HCL 1 MG/ML IJ SOLN: 1.0000 mg | INTRAMUSCULAR | Status: DC | PRN

## 2021-08-22 MED ORDER — ACETAMINOPHEN 500 MG PO TABS
ORAL_TABLET | ORAL | Status: AC
  Filled 2021-08-22: qty 2

## 2021-08-22 MED ORDER — ROCURONIUM BROMIDE 100 MG/10ML IV SOLN
INTRAVENOUS | Status: DC | PRN
  Administered 2021-08-22: 40 mg via INTRAVENOUS

## 2021-08-22 MED ORDER — TRAMADOL HCL 50 MG PO TABS
50.0000 mg | ORAL_TABLET | Freq: Four times a day (QID) | ORAL | Status: DC | PRN
  Administered 2021-08-22 (×2): 50 mg via ORAL
  Filled 2021-08-22 (×2): qty 1

## 2021-08-22 MED ORDER — KETOROLAC TROMETHAMINE 15 MG/ML IJ SOLN
INTRAMUSCULAR | Status: DC | PRN
  Administered 2021-08-22: 15 mg via INTRAVENOUS

## 2021-08-22 MED ORDER — LACTATED RINGERS IV SOLN: INTRAVENOUS | Status: DC

## 2021-08-22 MED ORDER — OXYCODONE HCL 5 MG PO TABS: 5.0000 mg | ORAL_TABLET | Freq: Once | ORAL | Status: DC | PRN

## 2021-08-22 MED ORDER — OXYCODONE HCL 5 MG PO TABS
5.0000 mg | ORAL_TABLET | ORAL | Status: DC | PRN
  Filled 2021-08-22: qty 2

## 2021-08-22 MED ORDER — BUPIVACAINE-EPINEPHRINE (PF) 0.5% -1:200000 IJ SOLN
INTRAMUSCULAR | Status: DC | PRN
  Administered 2021-08-22: 20 mL

## 2021-08-22 MED ORDER — ACETAMINOPHEN 500 MG PO TABS: 1000.0000 mg | ORAL_TABLET | Freq: Once | ORAL | Status: DC

## 2021-08-22 MED ORDER — SUGAMMADEX SODIUM 200 MG/2ML IV SOLN
INTRAVENOUS | Status: DC | PRN
  Administered 2021-08-22: 200 mg via INTRAVENOUS

## 2021-08-22 MED ORDER — ACETAMINOPHEN 500 MG PO TABS
1000.0000 mg | ORAL_TABLET | Freq: Four times a day (QID) | ORAL | Status: DC
  Administered 2021-08-22 – 2021-08-23 (×2): 1000 mg via ORAL
  Filled 2021-08-22 (×2): qty 2

## 2021-08-22 MED ORDER — DROPERIDOL 2.5 MG/ML IJ SOLN
INTRAMUSCULAR | Status: DC | PRN
  Administered 2021-08-22: .625 mg via INTRAVENOUS

## 2021-08-22 MED ORDER — ENOXAPARIN SODIUM 40 MG/0.4ML IJ SOSY: 40.0000 mg | PREFILLED_SYRINGE | INTRAMUSCULAR | Status: DC

## 2021-08-22 MED ORDER — LIDOCAINE HCL (CARDIAC) PF 100 MG/5ML IV SOSY
PREFILLED_SYRINGE | INTRAVENOUS | Status: DC | PRN
  Administered 2021-08-22: 60 mg via INTRAVENOUS

## 2021-08-22 MED ORDER — ACETAMINOPHEN 500 MG PO TABS
1000.0000 mg | ORAL_TABLET | ORAL | Status: AC
  Administered 2021-08-22: 1000 mg via ORAL

## 2021-08-22 MED ORDER — CIPROFLOXACIN IN D5W 400 MG/200ML IV SOLN
INTRAVENOUS | Status: AC
  Filled 2021-08-22: qty 200

## 2021-08-22 MED ORDER — OXYCODONE HCL 5 MG/5ML PO SOLN: 5.0000 mg | Freq: Once | ORAL | Status: DC | PRN

## 2021-08-22 MED ORDER — SODIUM CHLORIDE 0.9 % IR SOLN
Status: DC | PRN
  Administered 2021-08-22: 1000 mL

## 2021-08-22 SURGICAL SUPPLY — 36 items
APPLIER CLIP 5 13 M/L LIGAMAX5 (MISCELLANEOUS) ×2
CHLORAPREP W/TINT 26 (MISCELLANEOUS) ×2 IMPLANT
CLIP APPLIE 5 13 M/L LIGAMAX5 (MISCELLANEOUS) ×1 IMPLANT
COVER MAYO STAND STRL (DRAPES) IMPLANT
DECANTER SPIKE VIAL GLASS SM (MISCELLANEOUS) ×2 IMPLANT
DERMABOND ADVANCED (GAUZE/BANDAGES/DRESSINGS) ×1
DERMABOND ADVANCED .7 DNX12 (GAUZE/BANDAGES/DRESSINGS) ×1 IMPLANT
DRAPE C-ARM 42X72 X-RAY (DRAPES) IMPLANT
ELECT REM PT RETURN 9FT ADLT (ELECTROSURGICAL) ×2
ELECTRODE REM PT RTRN 9FT ADLT (ELECTROSURGICAL) ×1 IMPLANT
GLOVE SURG POLYISO LF SZ6.5 (GLOVE) ×1 IMPLANT
GLOVE SURG POLYISO LF SZ7 (GLOVE) ×1 IMPLANT
GLOVE SURG SIGNA 7.5 PF LTX (GLOVE) ×2 IMPLANT
GLOVE SURG UNDER POLY LF SZ6.5 (GLOVE) ×1 IMPLANT
GLOVE SURG UNDER POLY LF SZ7 (GLOVE) ×1 IMPLANT
GOWN STRL REUS W/ TWL LRG LVL3 (GOWN DISPOSABLE) ×2 IMPLANT
GOWN STRL REUS W/ TWL XL LVL3 (GOWN DISPOSABLE) ×1 IMPLANT
GOWN STRL REUS W/TWL LRG LVL3 (GOWN DISPOSABLE) ×4
GOWN STRL REUS W/TWL XL LVL3 (GOWN DISPOSABLE) ×2
IRRIG SUCT STRYKERFLOW 2 WTIP (MISCELLANEOUS) ×2
IRRIGATION SUCT STRKRFLW 2 WTP (MISCELLANEOUS) ×1 IMPLANT
NS IRRIG 1000ML POUR BTL (IV SOLUTION) ×2 IMPLANT
PACK BASIN DAY SURGERY FS (CUSTOM PROCEDURE TRAY) ×2 IMPLANT
POUCH SPECIMEN RETRIEVAL 10MM (ENDOMECHANICALS) ×2 IMPLANT
SCISSORS LAP 5X35 DISP (ENDOMECHANICALS) ×2 IMPLANT
SET CHOLANGIOGRAPH 5 50 .035 (SET/KITS/TRAYS/PACK) IMPLANT
SET TUBE SMOKE EVAC HIGH FLOW (TUBING) ×2 IMPLANT
SLEEVE ENDOPATH XCEL 5M (ENDOMECHANICALS) ×4 IMPLANT
SLEEVE SCD COMPRESS KNEE MED (STOCKING) ×2 IMPLANT
SPECIMEN JAR SMALL (MISCELLANEOUS) ×2 IMPLANT
SUT MON AB 4-0 PC3 18 (SUTURE) ×2 IMPLANT
TOWEL GREEN STERILE FF (TOWEL DISPOSABLE) ×2 IMPLANT
TRAY LAPAROSCOPIC (CUSTOM PROCEDURE TRAY) ×2 IMPLANT
TROCAR XCEL BLUNT TIP 100MML (ENDOMECHANICALS) ×2 IMPLANT
TROCAR XCEL NON-BLD 5MMX100MML (ENDOMECHANICALS) ×2 IMPLANT
TUBE CONNECTING 20X1/4 (TUBING) ×2 IMPLANT

## 2021-08-22 NOTE — Op Note (Signed)
Laparoscopic Cholecystectomy Procedure Note  Indications: This patient presents with symptomatic gallbladder disease and will undergo laparoscopic cholecystectomy.  Pre-operative Diagnosis: symptomatic cholelithiasis  Post-operative Diagnosis: Same  Surgeon: Coralie Keens   Assistants: Carlena Hurl, PA  Anesthesia: General endotracheal anesthesia  ASA Class: 2  Procedure Details  The patient was seen again in the Holding Room. The risks, benefits, complications, treatment options, and expected outcomes were discussed with the patient. The possibilities of reaction to medication, pulmonary aspiration, perforation of viscus, bleeding, recurrent infection, finding a normal gallbladder, the need for additional procedures, failure to diagnose a condition, the possible need to convert to an open procedure, and creating a complication requiring transfusion or operation were discussed with the patient. The likelihood of improving the patient's symptoms with return to their baseline status is good.  The patient and/or family concurred with the proposed plan, giving informed consent. The site of surgery properly noted. The patient was taken to Operating Room, identified as Morgan [name] and the procedure verified as Laparoscopic Cholecystectomy with Intraoperative Cholangiogram. A Time Out was held and the above information confirmed.  Prior to the induction of general anesthesia, antibiotic prophylaxis was administered. General endotracheal anesthesia was then administered and tolerated well. After the induction, the abdomen was prepped with Chloraprep and draped in sterile fashion. The patient was positioned in the supine position.  Local anesthetic agent was injected into the skin near the umbilicus and an incision made. We dissected down to the abdominal fascia with blunt dissection.  The fascia was incised vertically and we entered the peritoneal cavity bluntly.  A pursestring suture of 0-Vicryl  was placed around the fascial opening.  The Hasson cannula was inserted and secured with the stay suture.  Pneumoperitoneum was then created with CO2 and tolerated well without any adverse changes in the patient's vital signs. A 5-mm port was placed in the subxiphoid position.  Two 5-mm ports were placed in the right upper quadrant. All skin incisions were infiltrated with a local anesthetic agent before making the incision and placing the trocars.   We positioned the patient in reverse Trendelenburg, tilted slightly to the patient's left.  The gallbladder was identified and was chronically inflamed and floppy.  The fundus grasped and retracted cephalad. Adhesions were lysed bluntly and with the electrocautery where indicated, taking care not to injure any adjacent organs or viscus. The infundibulum was grasped and retracted laterally, exposing the peritoneum overlying the triangle of Calot. This was then divided and exposed in a blunt fashion. The cystic duct was clearly identified and bluntly dissected circumferentially. A critical view of the cystic duct and cystic artery was obtained.  The cystic duct was then ligated with clips and divided. The cystic artery was, dissected free, ligated with clips and divided as well.   The gallbladder was dissected from the liver bed in retrograde fashion with the electrocautery. The gallbladder was removed and placed in an Endocatch sac. The liver bed was irrigated and inspected. Hemostasis was achieved with the electrocautery. Copious irrigation was utilized and was repeatedly aspirated until clear.  The gallbladder and Endocatch sac were then removed through the umbilical port site.  The pursestring suture was used to close the umbilical fascia.    We again inspected the right upper quadrant for hemostasis.  Pneumoperitoneum was released as we removed the trocars.  4-0 Monocryl was used to close the skin.   Skin glue was then applied. The patient was then extubated  and brought to the recovery room  in stable condition. Instrument, sponge, and needle counts were correct at closure and at the conclusion of the case.   Findings: Chronic cholecystitis with Cholelithiasis  Estimated Blood Loss: Minimal         Drains: 0         Specimens: Gallbladder           Complications: None; patient tolerated the procedure well.         Disposition: PACU - hemodynamically stable.         Condition: stable

## 2021-08-22 NOTE — Anesthesia Postprocedure Evaluation (Signed)
Anesthesia Post Note  Patient: Morgan [name]  Procedure(s) Performed: LAPAROSCOPIC CHOLECYSTECTOMY (Abdomen)     Patient location during evaluation: PACU Anesthesia Type: General Level of consciousness: awake and alert Pain management: pain level controlled Vital Signs Assessment: post-procedure vital signs reviewed and stable Respiratory status: spontaneous breathing, nonlabored ventilation and respiratory function stable Cardiovascular status: blood pressure returned to baseline Postop Assessment: no apparent nausea or vomiting Anesthetic complications: no   No notable events documented.  Last Vitals:  Vitals:   08/22/21 1200 08/22/21 1215  BP: (!) 139/99 128/68  Pulse: 66 64  Resp: 14 12  Temp:    SpO2: 99% 96%    Last Pain:  Vitals:   08/22/21 1221  TempSrc:   PainSc: Asleep                 Marthenia Rolling

## 2021-08-22 NOTE — Anesthesia Procedure Notes (Signed)
Procedure Name: Intubation Date/Time: 08/22/2021 11:02 AM Performed by: Signe Colt, CRNA Pre-anesthesia Checklist: Patient identified, Emergency Drugs available, Suction available and Patient being monitored Patient Re-evaluated:Patient Re-evaluated prior to induction Oxygen Delivery Method: Circle system utilized Preoxygenation: Pre-oxygenation with 100% oxygen Induction Type: IV induction Ventilation: Mask ventilation without difficulty Laryngoscope Size: Mac and 3 Tube type: Oral Tube size: 7.0 mm Number of attempts: 1 Airway Equipment and Method: Stylet and Oral airway Placement Confirmation: ETT inserted through vocal cords under direct vision, positive ETCO2 and breath sounds checked- equal and bilateral Secured at: 21 cm Tube secured with: Tape Dental Injury: Teeth and Oropharynx as per pre-operative assessment

## 2021-08-22 NOTE — Interval H&P Note (Signed)
History and Physical Interval Note: no change in H and P  08/22/2021 10:24 AM  Morgan [name]  has presented today for surgery, with the diagnosis of SYMPTOMATIC GALLSTONES.  The various methods of treatment have been discussed with the patient and family. After consideration of risks, benefits and other options for treatment, the patient has consented to  Procedure(s): LAPAROSCOPIC CHOLECYSTECTOMY WITH POSSIBLE C-GRAM (N/A) as a surgical intervention.  The patient's history has been reviewed, patient examined, no change in status, stable for surgery.  I have reviewed the patient's chart and labs.  Questions were answered to the patient's satisfaction.     Coralie Keens

## 2021-08-22 NOTE — Anesthesia Preprocedure Evaluation (Addendum)
Anesthesia Evaluation  Patient identified by MRN, date of birth, ID band Patient awake    Reviewed: Allergy & Precautions, NPO status , Patient's Chart, lab work & pertinent test results  History of Anesthesia Complications (+) PONV and history of anesthetic complications  Airway Mallampati: II  TM Distance: >3 FB Neck ROM: Full    Dental no notable dental hx.    Pulmonary neg pulmonary ROS, former smoker,    Pulmonary exam normal        Cardiovascular negative cardio ROS Normal cardiovascular exam     Neuro/Psych negative neurological ROS  negative psych ROS   GI/Hepatic Neg liver ROS, GERD  ,Gallstones   Endo/Other  Hypothyroidism   Renal/GU negative Renal ROS  negative genitourinary   Musculoskeletal  (+) Arthritis ,   Abdominal   Peds  Hematology negative hematology ROS (+)   Anesthesia Other Findings Mixed connective tissue disease  Reproductive/Obstetrics negative OB ROS                            Anesthesia Physical Anesthesia Plan  ASA: 2  Anesthesia Plan: General   Post-op Pain Management: Tylenol PO (pre-op)   Induction: Intravenous  PONV Risk Score and Plan: 4 or greater and Treatment may vary due to age or medical condition, Ondansetron, Dexamethasone, Droperidol and Propofol infusion  Airway Management Planned: Oral ETT  Additional Equipment: None  Intra-op Plan:   Post-operative Plan: Extubation in OR  Informed Consent: I have reviewed the patients History and Physical, chart, labs and discussed the procedure including the risks, benefits and alternatives for the proposed anesthesia with the patient or authorized representative who has indicated his/her understanding and acceptance.     Dental advisory given  Plan Discussed with: CRNA  Anesthesia Plan Comments:        Anesthesia Quick Evaluation

## 2021-08-22 NOTE — Transfer of Care (Signed)
Immediate Anesthesia Transfer of Care Note  Patient: Morgan [name]  Procedure(s) Performed: LAPAROSCOPIC CHOLECYSTECTOMY (Abdomen)  Patient Location: PACU  Anesthesia Type:General  Level of Consciousness: awake, alert , oriented and patient cooperative  Airway & Oxygen Therapy: Patient Spontanous Breathing and Patient connected to face mask oxygen  Post-op Assessment: Report given to RN and Post -op Vital signs reviewed and stable  Post vital signs: Reviewed and stable  Last Vitals:  Vitals Value Taken Time  BP 149/86 08/22/21 1145  Temp    Pulse 69 08/22/21 1145  Resp 16 08/22/21 1145  SpO2 100 % 08/22/21 1145  Vitals shown include unvalidated device data.  Last Pain:  Vitals:   08/22/21 1033  TempSrc: Oral  PainSc: 0-No pain         Complications: No notable events documented.

## 2021-08-23 ENCOUNTER — Encounter (HOSPITAL_BASED_OUTPATIENT_CLINIC_OR_DEPARTMENT_OTHER): Payer: Self-pay | Admitting: Surgery

## 2021-08-23 LAB — SURGICAL PATHOLOGY

## 2021-08-23 MED ORDER — TRAMADOL HCL 50 MG PO TABS: 50.0000 mg | ORAL_TABLET | Freq: Four times a day (QID) | ORAL | 0 refills | Status: DC | PRN

## 2021-08-23 NOTE — Discharge Instructions (Signed)
[facility] ______[facility] LAPAROSCOPIC SURGERY: POST OP INSTRUCTIONS Always review your discharge instruction sheet given to you by the facility where your surgery was performed. IF YOU HAVE DISABILITY OR FAMILY LEAVE FORMS, YOU MUST BRING THEM TO THE OFFICE FOR PROCESSING.   DO NOT GIVE THEM TO YOUR DOCTOR.  A prescription for pain medication may be given to you upon discharge.  Take your pain medication as prescribed, if needed.  If narcotic pain medicine is not needed, then you may take acetaminophen (Tylenol) or ibuprofen (Advil) as needed. Take your usually prescribed medications unless otherwise directed. If you need a refill on your pain medication, please contact your pharmacy.  They will contact our office to request authorization. Prescriptions will not be filled after 5pm or on week-ends. You should follow a light diet the first few days after arrival home, such as soup and crackers, etc.  Be sure to include lots of fluids daily. Most patients will experience some swelling and bruising in the area of the incisions.  Ice packs will help.  Swelling and bruising can take several days to resolve.  It is common to experience some constipation if taking pain medication after surgery.  Increasing fluid intake and taking a stool softener (such as Colace) will usually help or prevent this problem from occurring.  A mild laxative (Milk of Magnesia or Miralax) should be taken according to package instructions if there are no bowel movements after 48 hours. Unless discharge instructions indicate otherwise, you may remove your bandages 24-48 hours after surgery, and you may shower at that time.  You may have steri-strips (small skin tapes) in place directly over the incision.  These strips should be left on the skin for 7-10 days.  If your surgeon used skin glue on the incision, you may shower in 24 hours.  The glue will flake off over the next 2-3 weeks.  Any sutures or staples will be  removed at the office during your follow-up visit. ACTIVITIES:  You may resume regular (light) daily activities beginning the next day--such as daily self-care, walking, climbing stairs--gradually increasing activities as tolerated.  You may have sexual intercourse when it is comfortable.  Refrain from any heavy lifting or straining until approved by your doctor. You may drive when you are no longer taking prescription pain medication, you can comfortably wear a seatbelt, and you can safely maneuver your car and apply brakes. RETURN TO WORK:  __________________________________________________________ You should see your doctor in the office for a follow-up appointment approximately 2-3 weeks after your surgery.  Make sure that you call for this appointment within a day or two after you arrive home to insure a convenient appointment time. OTHER INSTRUCTIONS: __________________________________________________________________________________________________________________________ __________________________________________________________________________________________________________________________ WHEN TO CALL YOUR DOCTOR: Fever over 101.0 Inability to urinate Continued bleeding from incision. Increased pain, redness, or drainage from the incision. Increasing abdominal pain  The clinic staff is available to answer your questions during regular business hours.  Please don't hesitate to call and ask to speak to one of the nurses for clinical concerns.  If you have a medical emergency, go to the nearest emergency room or call 911.  A surgeon from [facility] is always on call at the hospital. [address], [address], [place], [place]  [address] ? [address] [address], [place],    [address] [phone] ? [phone] ? FAX [phone] Web site: [email or web address]  

## 2021-08-23 NOTE — Discharge Summary (Signed)
Physician Discharge Summary  Patient ID: Morgan [name] MRN: [id number] DOB/AGE: 75/29/1947 75 y.o.  Admit date: 08/22/2021 Discharge date: 08/23/2021  Admission Diagnoses:  Discharge Diagnoses:  Principal Problem:   S/P laparoscopic cholecystectomy   Discharged Condition: good  Hospital Course: uneventful post op  recovery.  Discharged home POD#1  Consults:     Significant Diagnostic Studies:   Treatments: surgery: laparoscopic cholecystectomy  Discharge Exam: Blood pressure 110/74, pulse 68, temperature 97.8 F (36.6 C), resp. rate 18, height 5\' 4"  (1.626 m), weight 64.9 kg, last menstrual period 07/22/1989, SpO2 98 %. General appearance: alert, cooperative, and no distress Resp: clear to auscultation bilaterally Cardio: regular rate and rhythm, S1, S2 normal, no murmur, click, rub or gallop Abdomen soft, incisions clean  Disposition: Discharge disposition: 01-Home or Self Care        Allergies as of 08/23/2021       Reactions   Codeine Nausea And Vomiting   Hydrocodone-acetaminophen    REACTION: reaction not known   Penicillins Hives        Medication List     TAKE these medications    clobetasol 0.05 % topical foam Commonly known as: OLUX Apply topically 2 (two) times daily. What changed:  when to take this reasons to take this   halobetasol 0.05 % cream Commonly known as: ULTRAVATE APPLY TOPICALLY 2 TIMES DAILY AS NEEDED FOR UP TO 14 DAYS. AVOID FACE, GROIN ANDAXILLA   hydrocortisone valerate cream 0.2 % Commonly known as: WESTCORT Apply topically.   levothyroxine 50 MCG tablet Commonly known as: SYNTHROID Take 50 mcg 3 times a week (Mon, Wed, Fri),25 mcg 4 times a week (Tues, Thurs, Sat, Sun)   polyethylene glycol powder 17 GM/SCOOP powder Commonly known as: GLYCOLAX/MIRALAX Take 17 g by mouth as needed.   traMADol 50 MG tablet Commonly known as: ULTRAM Take 1-2 tablets (50-100 mg total) by mouth every 6 (six) hours as needed.    triamcinolone cream 0.1 % Commonly known as: KENALOG Apply topically.   valACYclovir 1000 MG tablet Commonly known as: VALTREX Take 2 tablets by mouth at first onset of symptoms of cold sores, then take 2 tablets by mouth 12 hours later.   Vitamin B12 500 MCG Tabs Take 1 tablet by mouth daily.   Vitamin D3 25 MCG (1000 UT) Caps Take 1 capsule by mouth daily.        Follow-up Information     Coralie Keens, MD Follow up in 4 week(s).   Specialty: General Surgery Contact information: [place] [address] [phone]                 Signed: Coralie Keens 08/23/2021, 7:16 AM

## 2021-08-29 ENCOUNTER — Ambulatory Visit: Payer: PPO

## 2021-09-12 DIAGNOSIS — N959 Unspecified menopausal and perimenopausal disorder: Secondary | ICD-10-CM | POA: Diagnosis not present

## 2021-09-12 DIAGNOSIS — E039 Hypothyroidism, unspecified: Secondary | ICD-10-CM | POA: Diagnosis not present

## 2021-09-12 DIAGNOSIS — D440 Neoplasm of uncertain behavior of thyroid gland: Secondary | ICD-10-CM | POA: Diagnosis not present

## 2021-09-12 DIAGNOSIS — E061 Subacute thyroiditis: Secondary | ICD-10-CM | POA: Diagnosis not present

## 2021-09-12 DIAGNOSIS — E6609 Other obesity due to excess calories: Secondary | ICD-10-CM | POA: Diagnosis not present

## 2021-09-12 DIAGNOSIS — E042 Nontoxic multinodular goiter: Secondary | ICD-10-CM | POA: Diagnosis not present

## 2021-09-12 DIAGNOSIS — Q842 Other congenital malformations of hair: Secondary | ICD-10-CM | POA: Diagnosis not present

## 2021-09-13 ENCOUNTER — Other Ambulatory Visit: Payer: Self-pay

## 2021-09-13 ENCOUNTER — Ambulatory Visit: Payer: PPO | Admitting: Dermatology

## 2021-09-13 ENCOUNTER — Encounter: Payer: Self-pay | Admitting: Dermatology

## 2021-09-13 DIAGNOSIS — L219 Seborrheic dermatitis, unspecified: Secondary | ICD-10-CM

## 2021-09-13 DIAGNOSIS — L578 Other skin changes due to chronic exposure to nonionizing radiation: Secondary | ICD-10-CM

## 2021-09-13 DIAGNOSIS — R21 Rash and other nonspecific skin eruption: Secondary | ICD-10-CM

## 2021-09-13 DIAGNOSIS — L821 Other seborrheic keratosis: Secondary | ICD-10-CM

## 2021-09-13 DIAGNOSIS — D171 Benign lipomatous neoplasm of skin and subcutaneous tissue of trunk: Secondary | ICD-10-CM | POA: Diagnosis not present

## 2021-09-13 DIAGNOSIS — L570 Actinic keratosis: Secondary | ICD-10-CM

## 2021-09-13 MED ORDER — CLOBETASOL PROPIONATE 0.05 % EX FOAM: Freq: Two times a day (BID) | CUTANEOUS | 5 refills | Status: DC

## 2021-09-13 NOTE — Patient Instructions (Addendum)
- Start 5-fluorouracil/calcipotriene cream twice daily for 7 days to chest, arms, hands. Prescription sent to [facility]. Patient advised they will receive an email to purchase the medication online and have it sent to their home. Patient provided with handout reviewing treatment course and side effects and advised to call or message [place] on MyChart with any concerns.  Treat once section at a time as directed.     Instructions for [facility] Medications  One or more of your medications was sent to the [facility] mail order compounding pharmacy. You will receive an email from them and can purchase the medicine through that link. It will then be mailed to your home at the address you confirmed. If for any reason you do not receive an email from them, please check your spam folder. If you still do not find the email, please let us know. [facility] phone number is [phone].    Use Opzelura twice daily as needed for rash.   If You Need Anything After Your Visit  If you have any questions or concerns for your doctor, please call our main line at [phone] and press option 4 to reach your doctor's medical assistant. If no one answers, please leave a voicemail as directed and we will return your call as soon as possible. Messages left after 4 pm will be answered the following business day.   You may also send [place] a message via [place]. We typically respond to MyChart messages within 1-2 business days.  For prescription refills, please ask your pharmacy to contact our office. Our fax number is [phone].  If you have an urgent issue when the clinic is closed that cannot wait until the next business day, you can page your doctor at the number below.    Please note that while we do our best to be available for urgent issues outside of office hours, we are not available 24/7.   If you have an urgent issue and are unable to reach [place], you may choose to seek  medical care at your doctor's office, retail clinic, urgent care center, or emergency room.  If you have a medical emergency, please immediately call 911 or go to the emergency department.  Pager Numbers  - Dr. Nehemiah Massed: [phone]  - Dr. Laurence Ferrari: [phone]  - Dr. Nicole Kindred: [phone]  In the event of inclement weather, please call our main line at [phone] for an update on the status of any delays or closures.  Dermatology Medication Tips: Please keep the boxes that topical medications come in in order to help keep track of the instructions about where and how to use these. Pharmacies typically print the medication instructions only on the boxes and not directly on the medication tubes.   If your medication is too expensive, please contact our office at [phone] option 4 or send [place] a message through [place].   We are unable to tell what your co-pay for medications will be in advance as this is different depending on your insurance coverage. However, we may be able to find a substitute medication at lower cost or fill out paperwork to get insurance to cover a needed medication.   If a prior authorization is required to get your medication covered by your insurance company, please allow [place] 1-2 business days to complete this process.  Drug prices often vary depending on where the prescription is filled and some pharmacies may offer cheaper prices.  The website www.goodrx.com contains coupons for medications through  different pharmacies. The prices here do not account for what the cost may be with help from insurance (it may be cheaper with your insurance), but the website can give you the price if you did not use any insurance.  - You can print the associated coupon and take it with your prescription to the pharmacy.  - You may also stop by our office during regular business hours and pick up a GoodRx coupon card.  - If you need your prescription sent electronically to a  different pharmacy, notify our office through [facility] or by phone at [phone] option 4.     Si Usted Necesita Algo Despus de Su Visita  Tambin puede enviarnos un mensaje a travs de Pharmacist, community. Por lo general respondemos a los mensajes de MyChart en el transcurso de 1 a 2 das hbiles.  Para renovar recetas, por favor pida a su farmacia que se ponga en contacto con nuestra oficina. Harland Dingwall de fax es [place] [phone].  Si tiene un asunto urgente cuando la clnica est cerrada y que no puede esperar hasta el siguiente da hbil, puede llamar/localizar a su doctor(a) al nmero que aparece a continuacin.   Por favor, tenga en cuenta que aunque hacemos todo lo posible para estar disponibles para asuntos urgentes fuera del horario de [place], no estamos disponibles las 24 horas del da, los 7 das de la [place].   Si tiene un problema urgente y no puede comunicarse con nosotros, puede optar por buscar atencin mdica  en el consultorio de su doctor(a), en una clnica privada, en un centro de atencin urgente o en una sala de emergencias.  Si tiene Engineering geologist, por favor llame inmediatamente al 911 o vaya a la sala de emergencias.  Nmeros de bper  - Dr. Nehemiah Massed: [phone]  - Dra. Moye: [phone]  - Dra. Nicole Kindred: [phone]  En caso de inclemencias del [place], por favor llame a Johnsie Kindred principal al [phone] para una actualizacin sobre el [place] de cualquier retraso o cierre.  Consejos para la medicacin en dermatologa: Por favor, guarde las cajas en las que vienen los medicamentos de uso tpico para ayudarle a seguir las instrucciones sobre dnde y cmo usarlos. Las farmacias generalmente imprimen las instrucciones del medicamento slo en las cajas y no directamente en los tubos del [place].   Si su medicamento es muy caro, por favor, pngase en contacto con Zigmund Daniel llamando al [phone] y presione la opcin 4 o envenos un  mensaje a travs de Pharmacist, community.   No podemos decirle cul ser su copago por los medicamentos por adelantado ya que esto es diferente dependiendo de la cobertura de su seguro. Sin embargo, es posible que podamos encontrar un medicamento sustituto a Electrical engineer un formulario para que el seguro cubra el medicamento que se considera necesario.   Si se requiere una autorizacin previa para que su compaa de seguros [place] su medicamento, por favor permtanos de 1 a 2 das hbiles para completar este proceso.  Los precios de los medicamentos varan con frecuencia dependiendo del Environmental consultant de dnde se surte la receta y alguna farmacias pueden ofrecer precios ms baratos.  El sitio web www.goodrx.com tiene cupones para medicamentos de Airline pilot. Los precios aqu no tienen en cuenta lo que podra costar con la ayuda del seguro (puede ser ms barato con su seguro), pero el sitio web puede darle el precio si no utiliz Research scientist (physical sciences).  - Puede imprimir el cupn correspondiente y llevarlo con  su receta a la farmacia.  - Tambin puede pasar por nuestra oficina durante el horario de atencin regular y Charity fundraiser una tarjeta de cupones de GoodRx.  - Si necesita que su receta se enve electrnicamente a una farmacia diferente, informe a nuestra oficina a travs de MyChart de [place] o por telfono llamando al [phone] y presione la opcin 4.

## 2021-09-13 NOTE — Progress Notes (Addendum)
Follow-Up Visit   Subjective  Morgan [name] is a 76 y.o. female who presents for the following: Actinic Keratosis (Recheck areas on face. Did use 5FU/Calcipotriene cream. She states she has had terrible outbreaks on her skin and does not think PDT will work out well for her either. Has been using Halobetasol for the outbreaks) and lesion (Chest. C/O rough scaly area. Dur: 2-3 weeks).  The patient has spots, moles and lesions to be evaluated, some may be new or changing and the patient has concerns that these could be cancer.  The following portions of the chart were reviewed this encounter and updated as appropriate:  Tobacco   Allergies   Meds   Problems   Med Hx   Surg Hx   Fam Hx       Review of Systems: No other skin or systemic complaints except as noted in HPI or Assessment and Plan.   Objective  Well appearing patient in no apparent distress; mood and affect are within normal limits.  A focused examination was performed including face, chest, hands. Relevant physical exam findings are noted in the Assessment and Plan.  Mid Forehead Stuck-on, waxy, tan-brown papule  --Discussed benign etiology and prognosis.   Right Flank 3 cm rubbery subcutaneous nodule  Left Thigh - Anterior Clear toay  Scalp Pink patches with greasy scale.    Assessment & Plan    Seborrheic keratosis Mid Forehead  Reassured benign age-related growth.  Recommend observation.  Discussed cryotherapy if spot(s) become irritated or inflamed.  Lipoma of torso Right Flank  Benign-appearing.  Observation.  Call clinic for new or changing lesions.    Can watch for changes or refer for general surgery excision given depth. Patient defers surgical referral at this time.   Rash Left Thigh - Anterior  Chronic condition with duration or expected duration over one year. Currently well-controlled.  History of chronic recurrent rash that typically responds (someone slowly) to halobetasol  topical.  Opzelura cream sample given to apply once daily to affected areas on body as needed for rash/itching.  Seborrheic dermatitis Scalp  Chronic condition with duration or expected duration over one year. Currently well-controlled.  Seborrheic Dermatitis  -  is a chronic persistent rash characterized by pinkness and scaling most commonly of the mid face but also can occur on the scalp (dandruff), ears; mid chest, mid back and groin.  It tends to be exacerbated by stress and cooler weather.  People who have neurologic disease may experience new onset or exacerbation of existing seborrheic dermatitis.  The condition is not curable but treatable and can be controlled.  Continue Clobetasol foam as directed for scalp.   Related Medications clobetasol (OLUX) 0.05 % topical foam Apply topically 2 (two) times daily.   Actinic Damage - Severe, confluent actinic changes with pre-cancerous actinic keratoses  - Severe, chronic, not at goal, secondary to cumulative UV radiation exposure over time - diffuse scaly erythematous macules and papules with underlying dyspigmentation - Discussed Prescription "Field Treatment" for Severe, Chronic Confluent Actinic Changes with Pre-Cancerous Actinic Keratoses Field treatment involves treatment of an entire area of skin that has confluent Actinic Changes (Sun/ Ultraviolet light damage) and PreCancerous Actinic Keratoses by method of PhotoDynamic Therapy (PDT) and/or prescription Topical Chemotherapy agents such as 5-fluorouracil, 5-fluorouracil/calcipotriene, and/or imiquimod.  The purpose is to decrease the number of clinically evident and subclinical PreCancerous lesions to prevent progression to development of skin cancer by chemically destroying early precancer changes that may or may not  be visible.  It has been shown to reduce the risk of developing skin cancer in the treated area. As a result of treatment, redness, scaling, crusting, and open sores may  occur during treatment course. One or more than one of these methods may be used and may have to be used several times to control, suppress and eliminate the PreCancerous changes. Discussed treatment course, expected reaction, and possible side effects. - Recommend daily broad spectrum sunscreen SPF 30+ to sun-exposed areas, reapply every 2 hours as needed.  - Staying in the shade or wearing long sleeves, sun glasses (UVA+UVB protection) and wide brim hats (4-inch brim around the entire circumference of the hat) are also recommended. - Call for new or changing lesions.  - Start 5-fluorouracil/calcipotriene cream twice daily for 7 days to chest, arms, hands. Prescription sent to [facility]. Patient advised they will receive an email to purchase the medication online and have it sent to their home. Patient provided with handout reviewing treatment course and side effects and advised to call or message [place] on MyChart with any concerns.    Return for arm recheck in 2-3 months , [place] Follow Up.  I, Emelia Salisbury, CMA, am acting as scribe for Forest Gleason, MD.  Documentation: I have reviewed the above documentation for accuracy and completeness, and I agree with the above.  Forest Gleason, MD

## 2021-09-17 ENCOUNTER — Telehealth: Payer: Self-pay | Admitting: Nurse Practitioner

## 2021-09-17 ENCOUNTER — Encounter: Payer: Self-pay | Admitting: Dermatology

## 2021-09-17 NOTE — Telephone Encounter (Signed)
Patient called and stated that she has an appointment with Nevin Bloodgood on 3/18 and she would like a returned call to discuss a few things with you. Please advise.

## 2021-09-17 NOTE — Telephone Encounter (Signed)
Spoke with the patient. She has noted in the last 2 weeks (estimated) there has been a return of symptoms she experienced when she SIBO. The most prominent has been bloating with gas "upper and lower." She states she is awakened with it sometimes. She "can be sitting and all the sudden I have to expel a lot of gas from my intestines." She "remember it being just like this before I was treated for SIBO."  She would like to take Xifaxan again.  (Breath test kits are on back order)

## 2021-09-17 NOTE — Progress Notes (Signed)
[facility] [phone]   [phone]  INITIAL CONSULT NOTE  Patient Care Team: Tower, Audrie Gallus, MD as PCP - General Jodelle Red, MD as PCP - Cardiology (Cardiology) Venancio Poisson, MD as Consulting Physician (Dermatology) Eldred Manges, MD as Consulting Physician (Orthopedic Surgery) Ria Comment, FNP as Nurse Practitioner (Family Medicine) Patrecia Pace, Delsa Sale, MD as Attending Physician (Endocrinology) Pollyann Savoy, MD as Consulting Physician (Rheumatology) Glenford Peers, OD as Referring Physician (Optometry)  Hematological/Oncological History 1) 05/17/2021: Ferritin 473 ng/mL (H), Transferrin 237 mg/dL  2) 22/94/6392: Iron 63 ug/dL, saturation 02%, transferrin 216 mg/dL, total bilirubin 1.4 mg/dL (H), Alk Phos 257 U/L (H), AST 30 U/L and ALT 71 U/L (H).   3) 09/19/2021: Establish care with [facility] Hematology/Oncology  CHIEF COMPLAINTS/PURPOSE OF CONSULTATION:  "Elevated ferritin levels "  HISTORY OF PRESENTING ILLNESS:  Morgan [name] 76 y.o. female with medical history significant for SIBO, hyperlipidemia, mixed connective tissue disorder, GERD, spinal stenosis and degenerative disc disease.  Patient is unaccompanied for this visit  On exam today, Morgan [name] is reporting flareup of her SIBO with mid abdominal pain for the last 2 to 3 days.  She has taken antibiotic therapy approximately a year ago which improved her symptoms in the past.  Otherwise, patient reports she is feeling well without any new symptoms.  She has a good appetite and her energy levels are stable.  She is able to complete all her daily activities on her own.  She denies any nausea or vomiting.  Her bowel habits are regular without any diarrhea or constipation. Patient denies  easy bruising or signs of active bleeding.  She denies fevers, chills, night sweats, shortness of breath, chest pain or cough.  She has no other complaints.  Rest of the 10 point ROS is  below.  MEDICAL HISTORY:  Past Medical History:  Diagnosis Date   Allergy    Bradycardia    Collagen vascular disease (HCC)    Connective tissue disease (HCC)    COVID-19    DDD (degenerative disc disease), cervical    DDD (degenerative disc disease), lumbar    Endometrial polyp    not resolved with D&C (following)   GERD (gastroesophageal reflux disease)    gas/belching issues-was on protonix for short time-not since 6 months   HLD (hyperlipidemia)    Mixed connective tissue disease (HCC) diagnosed 5 years ago   no problems since diagnosis   Multiple thyroid nodules 01/24/2014   Obesity    Osteoarthritis of cervical spine    PONV (postoperative nausea and vomiting)    Renal calculi    Spinal stenosis     SURGICAL HISTORY: Past Surgical History:  Procedure Laterality Date   APPENDECTOMY     CHOLECYSTECTOMY N/A 08/22/2021   Procedure: LAPAROSCOPIC CHOLECYSTECTOMY;  Surgeon: Abigail Miyamoto, MD;  Location: [facility];  Service: General;  Laterality: N/A;   COLONOSCOPY     DILATION AND CURETTAGE OF UTERUS     fibroids   DILATION AND CURETTAGE OF UTERUS  01/2005   endometrial polyp   ENDOMETRIAL BIOPSY  2008   fibroid   HAND SURGERY     carpal tunnel release bilateral Dr. Teressa Senter   hysteroscopic resection      polyps of PMB   TONSILLECTOMY     TUBAL LIGATION     1973   UPPER GASTROINTESTINAL ENDOSCOPY      SOCIAL HISTORY: Social History   Socioeconomic History   Marital status: Married  Spouse name: Not on file   Number of children: 4   Years of education: HS   Highest education level: Not on file  Occupational History   Occupation: Retired   Tobacco Use   Smoking status: Former    Types: Cigarettes    Quit date: 07/22/1965    Years since quitting: 56.2   Smokeless tobacco: Never  Vaping Use   Vaping Use: Never used  Substance and Sexual Activity   Alcohol use: No   Drug use: No   Sexual activity: Yes    Birth control/protection:  Surgical    Comment: BTL  Other Topics Concern   Not on file  Social History Narrative   + caffeine use     Social Determinants of Health   Financial Resource Strain: Not on file  Food Insecurity: Not on file  Transportation Needs: Not on file  Physical Activity: Not on file  Stress: Not on file  Social Connections: Not on file  Intimate Partner Violence: Not on file    FAMILY HISTORY: Family History  Problem Relation Age of Onset   Coronary artery disease Father    Heart attack Father        deceased at 63   Dementia Mother    Diabetes Maternal Grandmother    Goiter Maternal Grandmother    Diabetes Cousin    Cancer Grandchild        rare type (dying at age 32)   Colon cancer Neg Hx    Esophageal cancer Neg Hx    Stomach cancer Neg Hx    Rectal cancer Neg Hx    Colon polyps Neg Hx     ALLERGIES:  is allergic to codeine, hydrocodone-acetaminophen, and penicillins.  MEDICATIONS:  Current Outpatient Medications  Medication Sig Dispense Refill   Cholecalciferol (VITAMIN D3) 25 MCG (1000 UT) CAPS Take 1 capsule by mouth daily.     clobetasol (OLUX) 0.05 % topical foam Apply topically 2 (two) times daily. 50 g 5   Cyanocobalamin (VITAMIN B12) 500 MCG TABS Take 1 tablet by mouth daily.     halobetasol (ULTRAVATE) 0.05 % cream APPLY TOPICALLY 2 TIMES DAILY AS NEEDED FOR UP TO 14 DAYS. AVOID FACE, GROIN ANDAXILLA 30 g 0   hydrocortisone valerate cream (WESTCORT) 0.2 % Apply topically.     levothyroxine (SYNTHROID, LEVOTHROID) 50 MCG tablet Take 50 mcg 3 times a week (Mon, Wed, Fri),25 mcg 4 times a week (Tues, Thurs, Sat, Sun)     polyethylene glycol powder (GLYCOLAX/MIRALAX) 17 GM/SCOOP powder Take 17 g by mouth as needed.     traMADol (ULTRAM) 50 MG tablet Take 1-2 tablets (50-100 mg total) by mouth every 6 (six) hours as needed. 20 tablet 0   triamcinolone cream (KENALOG) 0.1 % Apply topically.     valACYclovir (VALTREX) 1000 MG tablet Take 2 tablets by mouth at first  onset of symptoms of cold sores, then take 2 tablets by mouth 12 hours later. 30 tablet 1   No current facility-administered medications for this visit.    REVIEW OF SYSTEMS:   Constitutional: ( - ) fevers, ( - )  chills , ( - ) night sweats Eyes: ( - ) blurriness of vision, ( - ) double vision, ( - ) watery eyes Ears, nose, mouth, throat, and face: ( - ) mucositis, ( - ) sore throat Respiratory: ( - ) cough, ( - ) dyspnea, ( - ) wheezes Cardiovascular: ( - ) palpitation, ( - ) chest discomfort, ( - )  lower extremity swelling Gastrointestinal:  ( - ) nausea, ( - ) heartburn, ( - ) change in bowel habits Skin: ( - ) abnormal skin rashes Lymphatics: ( - ) new lymphadenopathy, ( - ) easy bruising Neurological: ( - ) numbness, ( - ) tingling, ( - ) new weaknesses Behavioral/Psych: ( - ) mood change, ( - ) new changes  All other systems were reviewed with the patient and are negative.  PHYSICAL EXAMINATION: ECOG PERFORMANCE STATUS: 1 - Symptomatic but completely ambulatory  Vitals:   09/18/21 1417  BP: 139/79  Pulse: (!) 56  Resp: 15  Temp: 97.9 F (36.6 C)  SpO2: 98%   Filed Weights   09/18/21 1417  Weight: 144 lb 14.4 oz (65.7 kg)    GENERAL: well appearing female in NAD  SKIN: skin color, texture, turgor are normal, no rashes or significant lesions EYES: conjunctiva are pink and non-injected, sclera clear LUNGS: clear to auscultation and percussion with normal breathing effort HEART: regular rate & rhythm and no murmurs and no lower extremity edema Musculoskeletal: no cyanosis of digits and no clubbing  PSYCH: alert & oriented x 3, fluent speech NEURO: no focal motor/sensory deficits  LABORATORY DATA:  I have reviewed the data as listed CBC Latest Ref Rng & Units 09/18/2021 05/29/2021 07/31/2020  WBC 4.0 - 10.5 K/uL 5.4 7.0 4.3  Hemoglobin 12.0 - 15.0 g/dL 12.9 12.3 13.0  Hematocrit 36.0 - 46.0 % 39.8 37.3 39.0  Platelets 150 - 400 K/uL 185 199 216.0    CMP Latest Ref  Rng & Units 09/18/2021 08/21/2021 05/31/2021  Glucose 70 - 99 mg/dL 92 136(H) -  BUN 8 - 23 mg/dL 13 11 -  Creatinine 0.44 - 1.00 mg/dL 0.70 0.90 -  Sodium 135 - 145 mmol/L 140 141 -  Potassium 3.5 - 5.1 mmol/L 4.0 3.6 -  Chloride 98 - 111 mmol/L 105 106 -  CO2 22 - 32 mmol/L 28 29 -  Calcium 8.9 - 10.3 mg/dL 9.4 9.1 -  Total Protein 6.5 - 8.1 g/dL 6.6 5.7(L) 6.3  Total Bilirubin 0.3 - 1.2 mg/dL 0.6 0.6 1.4(H)  Alkaline Phos 38 - 126 U/L 98 63 268(H)  AST 15 - 41 U/L 55(H) 19 30  ALT 0 - 44 U/L 25 12 71(H)     RADIOGRAPHIC STUDIES: I have personally reviewed the radiological images as listed and agreed with the findings in the report. No results found.  ASSESSMENT & PLAN Morgan [name] is a 76 y.o. female female who presents to the clinic for evaluation for elevated ferritin levels.  I reviewed that elevated ferritin levels have numerous possible etiologies. These include hereditary hemochromatosis (heterozygous or homozygous), inflammation, liver disease, or iron overload from an exogenous source. Hereditary hemochromatosis is a hereditary condition caused by mutations in the HFE gene, which regulates iron absorption. The most common genes mutated in this condition are the C282Y and H63D genes. Homozygous mutations represent a disease state which requires phlebotomy to decrease ferritin levels to a goal of <50  (Blood (2010) 116 (3): 317-325). The goal is to decrease ferritin so there is no deposition in critical organs (liver, heart, pancreas and thyroid). Heterozygous mutations (or compound heterozygotes) rarely require phlebotomy, but do have elevated serum iron/ferritin levels.  Ferritin is an acute phase reactant and can be elevated with systemic inflammation. Direct damage to liver tissue can also cause spillage of ferritin into the blood, resulting in elevated ferritin.  Additionally, serum iron levels can be quite transient  and an elevation or serum iron may not represent a true  overload of total body iron (best lab for this is ferritin).   # Elevated Ferritin Levels: --Labs from November 2022 showed elevated liver enzymes and patient underwent cholecystectomy earlier this month. Possible etiology could be hepatobiliary process.  --US of the abdomen and CT scan from November 2022 show no evidence of liver disease.  --labs to include CBC, CMP, LDH, ESR, CRP, iron panel and ferritin levels --If today's labs are unremarkable and there is persistently elevated ferritin levels, we will send for HFE gene mutation.  --RTC pending results of above studies.   #Abdominal pain/SIBO: --We will reach out to GI team for a follow up to discuss symptoms and evaluate for antibiotic therapy  Orders Placed This Encounter  Procedures   CBC with Differential ([facility] Only)    Standing Status:   Future    Number of Occurrences:   1    Standing Expiration Date:   09/17/2022   CMP ([facility] only)    Standing Status:   Future    Number of Occurrences:   1    Standing Expiration Date:   09/17/2022   Ferritin    Standing Status:   Future    Number of Occurrences:   1    Standing Expiration Date:   09/17/2022   Iron and Iron Binding Capacity ([facility] only)    Standing Status:   Future    Number of Occurrences:   1    Standing Expiration Date:   09/17/2022   Sedimentation rate    Standing Status:   Future    Number of Occurrences:   1    Standing Expiration Date:   09/17/2022   C-reactive protein    Standing Status:   Future    Number of Occurrences:   1    Standing Expiration Date:   09/17/2022    All questions were answered. The patient knows to call the clinic with any problems, questions or concerns.  I have spent a total of 60 minutes minutes of face-to-face and non-face-to-face time, preparing to see the patient, obtaining and/or reviewing separately obtained history, performing a medically appropriate examination, counseling and educating the patient, ordering  tests/procedures, communicating with other health care professionals, documenting clinical information in the electronic health record, and care coordination.   Morgan Query, PA-C Department of Hematology/Oncology [place] at [facility] Phone: [phone]  Patient was seen with Dr. Lorenso Courier.   I have read the above note and personally examined the patient. I agree with the assessment and plan as noted above.  Briefly Ms. Vicke Plotner is a 76 year old female who presents for evaluation of elevated ferritin.  On 05/17/2021 the patient's  Ferritin was 473 ng/mL.  At this time the etiology of her elevated ferritin is unclear.  She has mixed connective tissue disease which may be causing increased inflammation.  We will order inflammatory markers to assess this further.  Additionally she has elevated LFTs and this finding may be secondary to liver disease.  Lower suspicion for iron overload secondary to hereditary hemochromatosis, however if the above work-up is unremarkable we could pursue HFE gene testing.  The patient voiced understanding of this plan moving forward.   Ledell Peoples, MD Department of Hematology/Oncology [place] at [facility] Phone: [phone] Pager: [phone] Email: [email or web address]

## 2021-09-18 ENCOUNTER — Inpatient Hospital Stay: Payer: PPO | Attending: Physician Assistant | Admitting: Physician Assistant

## 2021-09-18 ENCOUNTER — Other Ambulatory Visit: Payer: Self-pay

## 2021-09-18 ENCOUNTER — Inpatient Hospital Stay: Payer: PPO

## 2021-09-18 VITALS — BP 139/79 | HR 56 | Temp 97.9°F | Resp 15 | Ht 64.0 in | Wt 144.9 lb

## 2021-09-18 DIAGNOSIS — Z87891 Personal history of nicotine dependence: Secondary | ICD-10-CM

## 2021-09-18 DIAGNOSIS — R109 Unspecified abdominal pain: Secondary | ICD-10-CM | POA: Insufficient documentation

## 2021-09-18 DIAGNOSIS — K6389 Other specified diseases of intestine: Secondary | ICD-10-CM | POA: Diagnosis not present

## 2021-09-18 DIAGNOSIS — R7989 Other specified abnormal findings of blood chemistry: Secondary | ICD-10-CM

## 2021-09-18 LAB — CBC WITH DIFFERENTIAL (CANCER CENTER ONLY)
Abs Immature Granulocytes: 0.01 10*3/uL (ref 0.00–0.07)
Basophils Absolute: 0 10*3/uL (ref 0.0–0.1)
Basophils Relative: 0 %
Eosinophils Absolute: 0.4 10*3/uL (ref 0.0–0.5)
Eosinophils Relative: 7 %
HCT: 39.8 % (ref 36.0–46.0)
Hemoglobin: 12.9 g/dL (ref 12.0–15.0)
Immature Granulocytes: 0 %
Lymphocytes Relative: 31 %
Lymphs Abs: 1.7 10*3/uL (ref 0.7–4.0)
MCH: 30.4 pg (ref 26.0–34.0)
MCHC: 32.4 g/dL (ref 30.0–36.0)
MCV: 93.6 fL (ref 80.0–100.0)
Monocytes Absolute: 0.8 10*3/uL (ref 0.1–1.0)
Monocytes Relative: 15 %
Neutro Abs: 2.5 10*3/uL (ref 1.7–7.7)
Neutrophils Relative %: 47 %
Platelet Count: 185 10*3/uL (ref 150–400)
RBC: 4.25 MIL/uL (ref 3.87–5.11)
RDW: 13.1 % (ref 11.5–15.5)
WBC Count: 5.4 10*3/uL (ref 4.0–10.5)
nRBC: 0 % (ref 0.0–0.2)

## 2021-09-18 LAB — CMP (CANCER CENTER ONLY)
ALT: 25 U/L (ref 0–44)
AST: 55 U/L — ABNORMAL HIGH (ref 15–41)
Albumin: 4.4 g/dL (ref 3.5–5.0)
Alkaline Phosphatase: 98 U/L (ref 38–126)
Anion gap: 7 (ref 5–15)
BUN: 13 mg/dL (ref 8–23)
CO2: 28 mmol/L (ref 22–32)
Calcium: 9.4 mg/dL (ref 8.9–10.3)
Chloride: 105 mmol/L (ref 98–111)
Creatinine: 0.7 mg/dL (ref 0.44–1.00)
GFR, Estimated: >60 mL/min (ref >60)
Glucose, Bld: 92 mg/dL (ref 70–99)
Potassium: 4 mmol/L (ref 3.5–5.1)
Sodium: 140 mmol/L (ref 135–145)
Total Bilirubin: 0.6 mg/dL (ref 0.3–1.2)
Total Protein: 6.6 g/dL (ref 6.5–8.1)

## 2021-09-18 LAB — IRON AND IRON BINDING CAPACITY (CC-WL,HP ONLY)
Iron: 80 ug/dL (ref 28–170)
Saturation Ratios: 23 % (ref 10.4–31.8)
TIBC: 353 ug/dL (ref 250–450)
UIBC: 273 ug/dL (ref 148–442)

## 2021-09-18 LAB — C-REACTIVE PROTEIN: CRP: 1 mg/dL — ABNORMAL HIGH (ref <1.0)

## 2021-09-18 LAB — SEDIMENTATION RATE: Sed Rate: 5 mm/hr (ref 0–22)

## 2021-09-18 NOTE — Telephone Encounter (Signed)
Morgan [name], disregard this message. I spoke with the patient again today. She reports the gas is so bad that she is having terrible pain that is "an 8 or a 10." She reports terrible abdominal pain. She has "attacks of the pain. I have moved her appointment to Friday 09/21/21.

## 2021-09-19 ENCOUNTER — Telehealth: Payer: Self-pay | Admitting: Physician Assistant

## 2021-09-19 DIAGNOSIS — M5136 Other intervertebral disc degeneration, lumbar region: Secondary | ICD-10-CM | POA: Diagnosis not present

## 2021-09-19 DIAGNOSIS — M47816 Spondylosis without myelopathy or radiculopathy, lumbar region: Secondary | ICD-10-CM | POA: Diagnosis not present

## 2021-09-19 DIAGNOSIS — M4697 Unspecified inflammatory spondylopathy, lumbosacral region: Secondary | ICD-10-CM | POA: Diagnosis not present

## 2021-09-19 DIAGNOSIS — M545 Low back pain, unspecified: Secondary | ICD-10-CM | POA: Diagnosis not present

## 2021-09-19 DIAGNOSIS — R7989 Other specified abnormal findings of blood chemistry: Secondary | ICD-10-CM

## 2021-09-19 LAB — FERRITIN: Ferritin: 512 ng/mL — ABNORMAL HIGH (ref 11–307)

## 2021-09-19 NOTE — Telephone Encounter (Signed)
I called Ms. Benbrook to review the lab results from yesterday, 09/18/2021. Findings revealed worsening ferritin levels measuring 512 ng/mL. Iron panel and inflammatory markers were normal. There was mild elevation of AST at 55 U/L.  ? ?The recommendation is to proceed with additional diagnotic testing by checking HFE gene mutation. Patient will return for a lab only visit on 09/25/2021. I will call her with the results by phone.  ? ?Patient expressed understanding of the plan provided.  ?

## 2021-09-20 ENCOUNTER — Other Ambulatory Visit: Payer: Self-pay | Admitting: Dermatology

## 2021-09-20 DIAGNOSIS — B009 Herpesviral infection, unspecified: Secondary | ICD-10-CM

## 2021-09-21 ENCOUNTER — Encounter: Payer: Self-pay | Admitting: Gastroenterology

## 2021-09-21 ENCOUNTER — Telehealth: Payer: Self-pay | Admitting: Gastroenterology

## 2021-09-21 ENCOUNTER — Ambulatory Visit: Payer: PPO | Admitting: Gastroenterology

## 2021-09-21 VITALS — BP 112/70 | HR 64 | Ht 64.0 in | Wt 144.2 lb

## 2021-09-21 DIAGNOSIS — K581 Irritable bowel syndrome with constipation: Secondary | ICD-10-CM | POA: Diagnosis not present

## 2021-09-21 DIAGNOSIS — R195 Other fecal abnormalities: Secondary | ICD-10-CM

## 2021-09-21 DIAGNOSIS — R197 Diarrhea, unspecified: Secondary | ICD-10-CM

## 2021-09-21 DIAGNOSIS — R109 Unspecified abdominal pain: Secondary | ICD-10-CM

## 2021-09-21 DIAGNOSIS — K6389 Other specified diseases of intestine: Secondary | ICD-10-CM

## 2021-09-21 DIAGNOSIS — G8929 Other chronic pain: Secondary | ICD-10-CM

## 2021-09-21 MED ORDER — HYOSCYAMINE SULFATE SL 0.125 MG SL SUBL: 1.0000 | SUBLINGUAL_TABLET | Freq: Every day | SUBLINGUAL | 1 refills | Status: DC | PRN

## 2021-09-21 MED ORDER — RIFAXIMIN 550 MG PO TABS: 550.0000 mg | ORAL_TABLET | Freq: Three times a day (TID) | ORAL | 0 refills | Status: DC

## 2021-09-21 NOTE — Telephone Encounter (Signed)
Called patient back and she wants [place] sent to [place]. ? ?She will call back on Monday if the copay is too high ?

## 2021-09-21 NOTE — Telephone Encounter (Signed)
Other antibiotics may not be as effective in treating SIBO ?

## 2021-09-21 NOTE — Telephone Encounter (Signed)
Dr Silverio Decamp  patient does not want Servando Snare, she would like to try another antibiotic like "flagyl"  ?

## 2021-09-21 NOTE — Progress Notes (Signed)
Morgan [name]    [id number]    05/13/1946  Primary Care Physician:[name], Morgan Fanny, MD  Referring Physician: Tower, Morgan Fanny, MD [name],   [id number]   Chief complaint:  Abdominal pain, excess gas  HPI:  76 year old very pleasant female here for follow-up visit for generalized abdominal cramping, bloating and excess gas She has history of chronic irritable bowel syndrome predominant diarrhea and small intestinal bacterial overgrowth, which improved after course of Xifaxan.  She was relatively symptom-free for the past year until this past week when she developed generalized abdominal cramping associated with abdominal bloating and excess gas.  She is status postcholecystectomy August 22, 2021, is recovering well from the laparoscopic surgery.  She was noted to have elevated ferritin with normal iron level and iron saturation.  CRP was also elevated.  On average she has bowel movement every 2 to 3 days.  She is currently not on any laxatives.  She is eating fruit salad daily along with orange juice and mayonnaise and feels it helps her bowel habits.  No rectal bleeding or melena.  She had lost significant weight since she got COVID in 2021 and subsequent infection in 2022.  She continues to have altered taste     CT abdomen and pelvis September 22, 2020: Unremarkable with no acute pathology    EGD March 17, 2020: LA grade B reflux esophagitis, gastroesophageal flap valve Hill grade 3, small hiatal hernia.   Colonoscopy April 13, 2019: Left-sided diverticulosis and hemorrhoids otherwise unremarkable exam.   Outpatient Encounter Medications as of 09/21/2021  Medication Sig   Cholecalciferol (VITAMIN D3) 25 MCG (1000 UT) CAPS Take 1 capsule by mouth daily.   clobetasol (OLUX) 0.05 % topical foam Apply topically 2 (two) times daily.   Cyanocobalamin (VITAMIN B12) 500 MCG TABS Take 1 tablet by mouth daily.   halobetasol (ULTRAVATE) 0.05 % cream  APPLY TOPICALLY 2 TIMES DAILY AS NEEDED FOR UP TO 14 DAYS. AVOID FACE, GROIN ANDAXILLA   hydrocortisone valerate cream (WESTCORT) 0.2 % Apply topically.   levothyroxine (SYNTHROID, LEVOTHROID) 50 MCG tablet Take 50 mcg 3 times a week (Mon, Wed, Fri),25 mcg 4 times a week (Tues, Thurs, Sat, Sun)   polyethylene glycol powder (GLYCOLAX/MIRALAX) 17 GM/SCOOP powder Take 17 g by mouth as needed.   traMADol (ULTRAM) 50 MG tablet Take 1-2 tablets (50-100 mg total) by mouth every 6 (six) hours as needed.   triamcinolone cream (KENALOG) 0.1 % Apply topically.   valACYclovir (VALTREX) 1000 MG tablet TAKE 2 TABLET BY MOUTH AT FIRST ONSET OFSYMPTOMS OF COLD SORES, THEN TAKE 2 TABLETS BY MOUTH 12 HOURS LATER   No facility-administered encounter medications on file as of 09/21/2021.    Allergies as of 09/21/2021 - Review Complete 09/21/2021  Allergen Reaction Noted   Codeine Nausea And Vomiting 01/19/2007   Hydrocodone-acetaminophen  01/19/2007   Penicillins Hives 01/19/2007    Past Medical History:  Diagnosis Date   Allergy    Bradycardia    Collagen vascular disease ([place])    Connective tissue disease ([place])    COVID-19    DDD (degenerative disc disease), cervical    DDD (degenerative disc disease), lumbar    Endometrial polyp    not resolved with D&C (following)   GERD (gastroesophageal reflux disease)    gas/belching issues-was on protonix for short time-not since 6 months   HLD (hyperlipidemia)    Mixed connective tissue disease ([place])  diagnosed 5 years ago   no problems since diagnosis   Multiple thyroid nodules 01/24/2014   Obesity    Osteoarthritis of cervical spine    PONV (postoperative nausea and vomiting)    Renal calculi    Spinal stenosis     Past Surgical History:  Procedure Laterality Date   APPENDECTOMY     CHOLECYSTECTOMY N/A 08/22/2021   Procedure: LAPAROSCOPIC CHOLECYSTECTOMY;  Surgeon: Coralie Keens, MD;  Location: [facility];  Service: General;   Laterality: N/A;   COLONOSCOPY     DILATION AND CURETTAGE OF UTERUS     fibroids   DILATION AND CURETTAGE OF UTERUS  01/2005   endometrial polyp   ENDOMETRIAL BIOPSY  2008   fibroid   HAND SURGERY     carpal tunnel release bilateral Dr. Daylene Katayama   hysteroscopic resection      polyps of PMB   TONSILLECTOMY     TUBAL LIGATION     1973   UPPER GASTROINTESTINAL ENDOSCOPY      Family History  Problem Relation Age of Onset   Dementia Mother    Coronary artery disease Father    Heart attack Father        deceased at 53   Diabetes Maternal Grandmother    Goiter Maternal Grandmother    Cancer Grandchild        rare type (dying at age 66)   Diabetes Cousin        multiple   Colon cancer Neg Hx    Esophageal cancer Neg Hx    Stomach cancer Neg Hx    Rectal cancer Neg Hx    Colon polyps Neg Hx     Social History   Socioeconomic History   Marital status: Married    Spouse name: Not on file   Number of children: 4   Years of education: HS   Highest education level: Not on file  Occupational History   Occupation: Retired   Tobacco Use   Smoking status: Former    Types: Cigarettes    Quit date: 07/22/1965    Years since quitting: 56.2   Smokeless tobacco: Never   Tobacco comments:    Smoked for 2 years as a teen  Scientific laboratory technician Use: Never used  Substance and Sexual Activity   Alcohol use: No   Drug use: No   Sexual activity: Yes    Birth control/protection: Surgical    Comment: BTL  Other Topics Concern   Not on file  Social History Narrative   + caffeine use     Social Determinants of Health   Financial Resource Strain: Not on file  Food Insecurity: Not on file  Transportation Needs: Not on file  Physical Activity: Not on file  Stress: Not on file  Social Connections: Not on file  Intimate Partner Violence: Not on file      Review of systems: All other review of systems negative except as mentioned in the HPI.   Physical Exam: Vitals:   09/21/21  0811  BP: 112/70  Pulse: 64   Body mass index is 24.75 kg/m. Gen:      No acute distress HEENT:  sclera anicteric Abd:      soft, non-tender; no palpable masses, no distension Ext:    No edema Neuro: alert and oriented x 3 Psych: normal mood and affect  Data Reviewed:  Reviewed labs, radiology imaging, old records and pertinent past GI work up   Assessment and  Plan/Recommendations:  76 year old very pleasant female with chronic GERD, esophageal spasms, IBS constipation and abdominal bloating status postcholecystectomy for cholelithiasis and cholecystitis in February 2023 here for follow-up visit with complaints of generalized abdominal cramping, abdominal bloating and excess gas  IBS predominant constipation: We will do bowel purge with MiraLAX Continue diet with increased fruits and vegetables, high-fiber Increase water intake to 8 to 10 cups daily  If continues to have persistent symptoms, will empirically treat with course of Xifaxan for small intestinal bacterial overgrowth.  Rifaximin 550 mg 3 times daily for 2 weeks   GERD: She is hesitant to take any medications, currently symptoms are manageable Continue antireflux measures   Elevated ferritin: Possible elevation postoperative, is an inflammatory marker.  Also has mild elevation of CRP Iron saturation and total iron level is within normal range.  No significant LFT abnormality other than mild intermittent elevation of transaminases in the setting of cholecystitis and cholelithiasis We will continue to monitor, recheck iron panel and LFT in 2 months Do not recommend liver biopsy at this point unless has significant LFT abnormality or elevated iron saturation level She has hemochromatosis gene work-up pending ordered by hematology   Return in 1-2 months for follow-up visit  This visit required >40 minutes of patient care (this includes precharting, chart review, review of results, face-to-face time used for counseling as  well as treatment plan and follow-up. The patient was provided an opportunity to ask questions and all were answered. The patient agreed with the plan and demonstrated an understanding of the instructions.  Damaris Hippo , MD    CC: [name], Morgan Fanny, MD

## 2021-09-21 NOTE — Telephone Encounter (Signed)
Patient called to follow up on previous message wondering which medication she will be given. ?

## 2021-09-21 NOTE — Telephone Encounter (Signed)
Inbound call from [facility]. States Morgan [name] is not in stock, is there an alternative?  ?

## 2021-09-21 NOTE — Patient Instructions (Addendum)
We have sent the following medications to your pharmacy for you to pick up at your convenience: ?Xifaxan 550 mg three times daily x 14 days ?Levsin SL 0.125 mg daily as needed ? ?Dr Silverio Decamp recommends that you complete a bowel purge (to clean out your bowels). Please do the following: ?Purchase a bottle of Miralax over the counter as well as a box of 5 mg dulcolax tablets. ?Take 4 dulcolax tablets. ?Wait 1 hour. ?You will then drink 6-8 capfuls of Miralax mixed in an adequate amount of water/juice/gatorade (you may choose which of these liquids to drink) over the next 2-3 hours. ?You should expect results within 1 to 6 hours after completing the bowel purge. ? ?Please follow up with Dr Silverio Decamp on 11/30/21 at 8:30 am. ? ?If you are age 67 or older, your body mass index should be between 23-30. Your Body mass index is 24.75 kg/m?Marland Kitchen If this is out of the aforementioned range listed, please consider follow up with your Primary Care Provider. ? ?If you are age 36 or younger, your body mass index should be between 19-25. Your Body mass index is 24.75 kg/m?Marland Kitchen If this is out of the aformentioned range listed, please consider follow up with your Primary Care Provider.  ? ?________________________________________________________ ? ?The [facility] providers would like to encourage you to use [facility] to communicate with providers for non-urgent requests or questions.  Due to long hold times on the telephone, sending your provider a message by [facility] may be a faster and more efficient way to get a response.  Please allow 48 business hours for a response.  Please remember that this is for non-urgent requests.  ?_______________________________________________________ ?Due to recent changes in healthcare laws, you may see the results of your imaging and laboratory studies on MyChart before your provider has had a chance to review them.  We understand that in some cases there may be results that are confusing or concerning to you.  Not all laboratory results come back in the same time frame and the provider may be waiting for multiple results in order to interpret others.  Please give [place] 48 hours in order for your provider to thoroughly review all the results before contacting the office for clarification of your results.  ? ?

## 2021-09-24 NOTE — Telephone Encounter (Signed)
Inbound call from patient a call back at [phone] please. ?

## 2021-09-24 NOTE — Telephone Encounter (Signed)
Dr Silverio Decamp  patient received her full dose of Xifaxian at a 0 copy.  She does not want to take it. She wants something Nevin Bloodgood gave her last year. I guess Flagyl. Not sure. And she wants a nutrientist  referral and should she be taking an Probiotic  ?

## 2021-09-25 ENCOUNTER — Other Ambulatory Visit: Payer: Self-pay

## 2021-09-25 ENCOUNTER — Inpatient Hospital Stay: Payer: PPO | Attending: Physician Assistant

## 2021-09-25 DIAGNOSIS — R7989 Other specified abnormal findings of blood chemistry: Secondary | ICD-10-CM | POA: Insufficient documentation

## 2021-09-25 MED ORDER — METRONIDAZOLE 500 MG PO TABS: 500.0000 mg | ORAL_TABLET | Freq: Three times a day (TID) | ORAL | 0 refills | Status: DC

## 2021-09-25 NOTE — Telephone Encounter (Signed)
Sure, as long as patient is aware that Flagyl is inferior and is not the first-line therapy for SIBO.  Okay to send Rx for Flagyl to 50 mg 3 times daily for 10 days. ?

## 2021-09-25 NOTE — Progress Notes (Signed)
Subjective:   Morgan [name] is a 76 y.o. female who presents for Medicare Annual (Subsequent) preventive examination.  I connected with Anders Grant today by telephone and verified that I am speaking with the correct person using two identifiers. Location patient: home Location provider: work Persons participating in the virtual visit: patient, Marine scientist.    I discussed the limitations, risks, security and privacy concerns of performing an evaluation and management service by telephone and the availability of in person appointments. I also discussed with the patient that there may be a patient responsible charge related to this service. The patient expressed understanding and verbally consented to this telephonic visit.    Interactive audio and video telecommunications were attempted between this provider and patient, however failed, due to patient having technical difficulties OR patient did not have access to video capability.  We continued and completed visit with audio only.  Some vital signs may be absent or patient reported.   Time Spent with patient on telephone encounter: 20 minutes  Review of Systems     Cardiac Risk Factors include: advanced age (>68mn, >>86women)     Objective:    Today's Vitals   09/26/21 1115  Weight: 142 lb (64.4 kg)  Height: '5\' 4"'$  (1.626 m)   Body mass index is 24.37 kg/m.  Advanced Directives 09/26/2021 09/18/2021 08/22/2021 08/13/2021 02/06/2020 01/06/2018 12/04/2016  Does Patient Have a Medical Advance Directive? Yes Yes - Yes No Yes Yes  Type of AParamed[place]of [place]ult[place] will Living will [place]Living will [place]Living will - [place]Living will [place]on[place] LakeLiving will  Does patient want to make changes to medical advance directive? Yes (MAU/Ambulatory/Procedural Areas - Information given) - No - Patient declined No - Patient declined - - -   Copy of [place] Chart? - - Yes - validated most recent copy scanned in chart (See row information) - - Yes No - copy requested  Would patient like information on creating a medical advance directive? - - - - Yes (ED - Information included in AVS) - -  Pre-existing out of facility DNR order (yellow form or pink MOST form) - - - - - - -    Current Medications (verified) Outpatient Encounter Medications as of 09/26/2021  Medication Sig   Cholecalciferol (VITAMIN D3) 25 MCG (1000 UT) CAPS Take 1 capsule by mouth daily.   clobetasol (OLUX) 0.05 % topical foam Apply topically 2 (two) times daily.   Cyanocobalamin (VITAMIN B12) 500 MCG TABS Take 1 tablet by mouth daily.   halobetasol (ULTRAVATE) 0.05 % cream APPLY TOPICALLY 2 TIMES DAILY AS NEEDED FOR UP TO 14 DAYS. AVOID FACE, GROIN ANDAXILLA   hydrocortisone valerate cream (WESTCORT) 0.2 % Apply topically.   Hyoscyamine Sulfate SL (LEVSIN/SL) 0.125 MG SUBL Place 1 tablet under the tongue daily as needed.   levothyroxine (SYNTHROID, LEVOTHROID) 50 MCG tablet Take 50 mcg 3 times a week (Mon, Wed, Fri),25 mcg 4 times a week (Tues, Thurs, Sat, Sun)   metroNIDAZOLE (FLAGYL) 500 MG tablet Take 1 tablet (500 mg total) by mouth 3 (three) times daily.   polyethylene glycol powder (GLYCOLAX/MIRALAX) 17 GM/SCOOP powder Take 17 g by mouth as needed.   traMADol (ULTRAM) 50 MG tablet Take 1-2 tablets (50-100 mg total) by mouth every 6 (six) hours as needed.   triamcinolone cream (KENALOG) 0.1 % Apply topically.   valACYclovir (VALTREX) 1000 MG tablet TAKE 2 TABLET  BY MOUTH AT FIRST ONSET OFSYMPTOMS OF COLD SORES, THEN TAKE 2 TABLETS BY MOUTH 12 HOURS LATER   [DISCONTINUED] rifaximin (XIFAXAN) 550 MG TABS tablet Take 1 tablet (550 mg total) by mouth 3 (three) times daily. For 14 days   No facility-administered encounter medications on file as of 09/26/2021.    Allergies (verified) Codeine, Hydrocodone-acetaminophen, and Penicillins    History: Past Medical History:  Diagnosis Date   Allergy    Bradycardia    Collagen vascular disease ([place])    Connective tissue disease ([place])    COVID-19    DDD (degenerative disc disease), cervical    DDD (degenerative disc disease), lumbar    Endometrial polyp    not resolved with D&C (following)   GERD (gastroesophageal reflux disease)    gas/belching issues-was on protonix for short time-not since 6 months   HLD (hyperlipidemia)    Mixed connective tissue disease ([place]) diagnosed 5 years ago   no problems since diagnosis   Multiple thyroid nodules 01/24/2014   Obesity    Osteoarthritis of cervical spine    PONV (postoperative nausea and vomiting)    Renal calculi    Spinal stenosis    Past Surgical History:  Procedure Laterality Date   APPENDECTOMY     CHOLECYSTECTOMY N/A 08/22/2021   Procedure: LAPAROSCOPIC CHOLECYSTECTOMY;  Surgeon: Coralie Keens, MD;  Location: [facility];  Service: General;  Laterality: N/A;   COLONOSCOPY     DILATION AND CURETTAGE OF UTERUS     fibroids   DILATION AND CURETTAGE OF UTERUS  01/2005   endometrial polyp   ENDOMETRIAL BIOPSY  2008   fibroid   HAND SURGERY     carpal tunnel release bilateral Dr. Daylene Katayama   hysteroscopic resection      polyps of PMB   TONSILLECTOMY     TUBAL LIGATION     1973   UPPER GASTROINTESTINAL ENDOSCOPY     Family History  Problem Relation Age of Onset   Dementia Mother    Coronary artery disease Father    Heart attack Father        deceased at 57   Diabetes Maternal Grandmother    Goiter Maternal Grandmother    Cancer Grandchild        rare type (dying at age 37)   Diabetes Cousin        multiple   Colon cancer Neg Hx    Esophageal cancer Neg Hx    Stomach cancer Neg Hx    Rectal cancer Neg Hx    Colon polyps Neg Hx    Social History   Socioeconomic History   Marital status: Married    Spouse name: Not on file   Number of children: 4   Years of education: HS   Highest  education level: Not on file  Occupational History   Occupation: Retired   Tobacco Use   Smoking status: Former    Types: Cigarettes    Quit date: 07/22/1965    Years since quitting: 56.2   Smokeless tobacco: Never   Tobacco comments:    Smoked for 2 years as a teen  Scientific laboratory technician Use: Never used  Substance and Sexual Activity   Alcohol use: No   Drug use: No   Sexual activity: Yes    Birth control/protection: Surgical    Comment: BTL  Other Topics Concern   Not on file  Social History Narrative   + caffeine use     Social  Determinants of Health   Financial Resource Strain: Low Risk    Difficulty of Paying Living Expenses: Not hard at all  Food Insecurity: No Food Insecurity   Worried About [place] in the Last Year: Never true   Ran Out of Food in the Last Year: Never true  Transportation Needs: No Transportation Needs   Lack of Transportation (Medical): No   Lack of Transportation (Non-Medical): No  Physical Activity: Inactive   Days of Exercise per Week: 0 days   Minutes of Exercise per Session: 0 min  Stress: No Stress Concern Present   Feeling of Stress : Only a little  Social Connections: Moderately Integrated   Frequency of Communication with Friends and Family: More than three times a week   Frequency of Social Gatherings with Friends and Family: More than three times a week   Attends Religious Services: 1 to 4 times per year   Active Member of [employer] or Organizations: No   Attends Music therapist: Never   Marital Status: Married    Tobacco Counseling Counseling given: Not Answered Tobacco comments: Smoked for 2 years as a teen   Clinical Intake:  Pre-visit preparation completed: Yes  Pain : No/denies pain     BMI - recorded: 24.37 Nutritional Status: BMI of 19-24  Normal Nutritional Risks: None Diabetes: No  How often do you need to have someone help you when you read instructions, pamphlets, or other written  materials from your doctor or pharmacy?: 1 - Never  Diabetic? No  Interpreter Needed?: No  Information entered by :: Orrin Brigham LPN   Activities of Daily Living In your present state of health, do you have any difficulty performing the following activities: 09/26/2021 08/22/2021  Hearing? N N  Vision? N N  Difficulty concentrating or making decisions? N N  Walking or climbing stairs? N N  Dressing or bathing? N N  Doing errands, shopping? N -  Preparing Food and eating ? N -  Using the Toilet? N -  In the past six months, have you accidently leaked urine? N -  Do you have problems with loss of bowel control? N -  Managing your Medications? N -  Managing your Finances? N -  Housekeeping or managing your Housekeeping? N -  Some recent data might be hidden    Patient Care Team: [place], Wynelle Fanny, MD as PCP - General Buford Dresser, MD as PCP - Cardiology (Cardiology) Rolm Bookbinder, MD as Consulting Physician (Dermatology) Marybelle Killings, MD as Consulting Physician (Orthopedic Surgery) Kem Boroughs, [place] as Nurse Practitioner (Family Medicine) Ronnald Collum, Lourdes Sledge, MD as Attending Physician (Endocrinology) Bo Merino, MD as Consulting Physician (Rheumatology) Webb Laws, [place] as Referring Physician (Optometry)  Indicate any recent Medical Services you may have received from other than [facility] providers in the past year (date may be approximate).     Assessment:   This is a routine wellness examination for [place].  Hearing/Vision screen No results found.  Dietary issues and exercise activities discussed: Current Exercise Habits: The patient does not participate in regular exercise at present   Goals Addressed             This Visit's Progress    Patient Stated       Would like to maintain current routine       Depression Screen PHQ 2/9 Scores 09/26/2021 08/16/2020 02/09/2019 01/06/2018 12/04/2016 09/22/2015 04/13/2014  PHQ - 2 Score 0 0 0 0 0 0 0  PHQ-  9  Score - - - 0 - - -    Fall Risk Fall Risk  09/26/2021 08/16/2020 11/10/2019 06/16/2019 01/06/2018  Falls in the past year? 1 0 1 0 No  Comment - - - Emmi Telephone Survey: data to providers prior to load -  Number falls in past yr: 0 0 0 - -  Injury with Fall? 0 0 0 - -  Comment tripped down stairs - - - -  Risk for fall due to : No Fall Risks - - - -  Follow up Falls prevention discussed Falls evaluation completed - - -    FALL RISK PREVENTION PERTAINING TO THE HOME:  Any stairs in or around the home? Yes  If so, are there any without handrails? No  Home free of loose throw rugs in walkways, pet beds, electrical cords, etc? Yes  Adequate lighting in your home to reduce risk of falls? Yes   ASSISTIVE DEVICES UTILIZED TO PREVENT FALLS:  Life alert? No  Use of a cane, walker or w/c? No  Grab bars in the bathroom? Yes  Shower chair or bench in shower? Yes  Elevated toilet seat or a handicapped toilet? Yes   TIMED UP AND GO:  Was the test performed? No .    Cognitive Function: Normal cognitive status assessed by  this Nurse Health Advisor. No abnormalities found.   MMSE - Mini Mental State Exam 01/06/2018 12/04/2016 09/22/2015  Orientation to time '5 5 5  '$ Orientation to Place '5 5 5  '$ Registration '3 3 3  '$ Attention/ Calculation 0 0 5  Recall '3 3 3  '$ Language- name 2 objects 0 0 0  Language- repeat '1 1 1  '$ Language- follow 3 step command '3 3 3  '$ Language- read & follow direction 0 0 1  Write a sentence 0 0 0  Copy design 0 0 0  Total score '20 20 26        '$ Immunizations Immunization History  Administered Date(s) Administered   Fluad Quad(high Dose 65+) 04/03/2019, 04/23/2019, 07/04/2020   Influenza,inj,Quad PF,6+ Mos 04/13/2014, 09/22/2015, 08/12/2016, 08/06/2017   Pneumococcal Conjugate-13 04/13/2014   Pneumococcal Polysaccharide-23 09/15/2012   Td 11/07/1997, 01/28/2007   Tdap 05/28/2017   Zoster Recombinat (Shingrix) 02/13/2018, 07/20/2018   Zoster, Live 09/09/2006     TDAP status: Up to date  Flu Vaccine status: Due, Education has been provided regarding the importance of this vaccine. Advised may receive this vaccine at local pharmacy or Health Dept. Aware to provide a copy of the vaccination record if obtained from local pharmacy or Health Dept. Verbalized acceptance and understanding.  Pneumococcal vaccine status: Up to date  Covid-19 vaccine status: Information provided on how to obtain vaccines.   Qualifies for Shingles Vaccine? Yes   Zostavax completed Yes   Shingrix Completed?: Yes  Screening Tests Health Maintenance  Topic Date Due   COVID-19 Vaccine (1) Never done   MAMMOGRAM  05/15/2022   COLONOSCOPY (Pts 45-8yr Insurance coverage will need to be confirmed)  04/12/2024   TETANUS/TDAP  05/29/2027   Pneumonia Vaccine 75 Years old  Completed   DEXA SCAN  Completed   Hepatitis C Screening  Completed   Zoster Vaccines- Shingrix  Completed   HPV VACCINES  Aged Out    Health Maintenance  Health Maintenance Due  Topic Date Due   COVID-19 Vaccine (1) Never done    Colorectal cancer screening: Type of screening: Colonoscopy. Completed 04/13/19. Repeat every 5 years  Mammogram status: Completed 05/15/21. Repeat every  year  Bone Density status: Completed 05/11/20. Results reflect: Bone density results: NORMAL. Repeat every 2 years.  Lung Cancer Screening: (Low Dose CT Chest recommended if Age 49-80 years, 30 pack-year currently smoking OR have quit w/in 15years.) does not qualify.     Additional Screening:  Hepatitis C Screening: does qualify; Completed 09/22/15  Vision Screening: Recommended annual ophthalmology exams for early detection of glaucoma and other disorders of the eye. Is the patient up to date with their annual eye exam?  Yes  Who is the provider or what is the name of the office in which the patient attends annual eye exams? Dr. Einar Gip    Dental Screening: Recommended annual dental exams for proper oral  hygiene  Community Resource Referral / Chronic Care Management: CRR required this visit?  No   CCM required this visit?  No      Plan:     I have personally reviewed and noted the following in the patients chart:   Medical and social history Use of alcohol, tobacco or illicit drugs  Current medications and supplements including opioid prescriptions.  Functional ability and status Nutritional status Physical activity Advanced directives List of other physicians Hospitalizations, surgeries, and ER visits in previous 12 months Vitals Screenings to include cognitive, depression, and falls Referrals and appointments  In addition, I have reviewed and discussed with patient certain preventive protocols, quality metrics, and best practice recommendations. A written personalized care plan for preventive services as well as general preventive health recommendations were provided to patient.   Due to this being a telephonic visit, the after visit summary with patients personalized plan was offered to patient via mail or my-chart.Patient would like to access on my-chart.     Loma Messing, LPN   11/23/979   Nurse Health Advisor  Nurse Notes: none

## 2021-09-25 NOTE — Telephone Encounter (Signed)
Left message from patient that I sent Flagyl to her pharmacy ? ?Also referred her to a Nutrient  as requested by patient  ?

## 2021-09-25 NOTE — Addendum Note (Signed)
Addended by: Oda Kilts on: 09/25/2021 03:31 PM ? ? Modules accepted: Orders ? ?

## 2021-09-26 ENCOUNTER — Other Ambulatory Visit: Payer: PPO

## 2021-09-26 ENCOUNTER — Ambulatory Visit (INDEPENDENT_AMBULATORY_CARE_PROVIDER_SITE_OTHER): Payer: PPO

## 2021-09-26 VITALS — Ht 64.0 in | Wt 142.0 lb

## 2021-09-26 DIAGNOSIS — Z0000 Encounter for general adult medical examination without abnormal findings: Secondary | ICD-10-CM | POA: Diagnosis not present

## 2021-09-26 NOTE — Patient Instructions (Signed)
Morgan [name] , Thank you for taking time to complete for your Medicare Wellness Visit. I appreciate your ongoing commitment to your health goals. Please review the following plan we discussed and let me know if I can assist you in the future.   Screening recommendations/referrals: Colonoscopy: up to date, completed 04/13/19 due 04/12/24 Mammogram: up to date, due 05/15/22 Bone Density: up to date, due 05/11/22 Recommended yearly ophthalmology/optometry visit for glaucoma screening and checkup Recommended yearly dental visit for hygiene and checkup  Vaccinations: Influenza vaccine: Due-May obtain vaccine at our office or your local pharmacy. Pneumococcal vaccine: up to date Tdap vaccine: up to date, due 05/29/27 Shingles vaccine: up to date   Covid-19:newest booster available at your local pharmacy   Advanced directives: copy on file  Conditions/risks identified: see problem list   Next appointment: Follow up in one year for your annual wellness visit    Preventive Care 75 Years and Older, Female Preventive care refers to lifestyle choices and visits with your health care provider that can promote health and wellness. What does preventive care include? A yearly physical exam. This is also called an annual well check. Dental exams once or twice a year. Routine eye exams. Ask your health care provider how often you should have your eyes checked. Personal lifestyle choices, including: Daily care of your teeth and gums. Regular physical activity. Eating a healthy diet. Avoiding tobacco and drug use. Limiting alcohol use. Practicing safe sex. Taking low-dose aspirin every day. Taking vitamin and mineral supplements as recommended by your health care provider. What happens during an annual well check? The services and screenings done by your health care provider during your annual well check will depend on your age, overall health, lifestyle risk factors, and family history of  disease. Counseling  Your health care provider may ask you questions about your: Alcohol use. Tobacco use. Drug use. Emotional well-being. Home and relationship well-being. Sexual activity. Eating habits. History of falls. Memory and ability to understand (cognition). Work and work Statistician. Reproductive health. Screening  You may have the following tests or measurements: Height, weight, and BMI. Blood pressure. Lipid and cholesterol levels. These may be checked every 5 years, or more frequently if you are over 75 years old. Skin check. Lung cancer screening. You may have this screening every year starting at age 75 if you have a 30-pack-year history of smoking and currently smoke or have quit within the past 15 years. Fecal occult blood test (FOBT) of the stool. You may have this test every year starting at age 75. Flexible sigmoidoscopy or colonoscopy. You may have a sigmoidoscopy every 5 years or a colonoscopy every 10 years starting at age 75. Hepatitis C blood test. Hepatitis B blood test. Sexually transmitted disease (STD) testing. Diabetes screening. This is done by checking your blood sugar (glucose) after you have not eaten for a while (fasting). You may have this done every 1-3 years. Bone density scan. This is done to screen for osteoporosis. You may have this done starting at age 75. Mammogram. This may be done every 1-2 years. Talk to your health care provider about how often you should have regular mammograms. Talk with your health care provider about your test results, treatment options, and if necessary, the need for more tests. Vaccines  Your health care provider may recommend certain vaccines, such as: Influenza vaccine. This is recommended every year. Tetanus, diphtheria, and acellular pertussis (Tdap, Td) vaccine. You may need a Td booster every 10 years. Zoster  vaccine. You may need this after age 36. Pneumococcal 13-valent conjugate (PCV13) vaccine. One  dose is recommended after age 75. Pneumococcal polysaccharide (PPSV23) vaccine. One dose is recommended after age 75. Talk to your health care provider about which screenings and vaccines you need and how often you need them. This information is not intended to replace advice given to you by your health care provider. Make sure you discuss any questions you have with your health care provider. Document Released: 08/04/2015 Document Revised: 03/27/2016 Document Reviewed: 05/09/2015 Elsevier Interactive Patient Education  2017 [place] Prevention in the Home Falls can cause injuries. They can happen to people of all ages. There are many things you can do to make your home safe and to help prevent falls. What can I do on the outside of my home? Regularly fix the edges of walkways and driveways and fix any cracks. Remove anything that might make you trip as you walk through a door, such as a raised step or threshold. Trim any bushes or trees on the path to your home. Use bright outdoor lighting. Clear any walking paths of anything that might make someone trip, such as rocks or tools. Regularly check to see if handrails are loose or broken. Make sure that both sides of any steps have handrails. Any raised decks and porches should have guardrails on the edges. Have any leaves, snow, or ice cleared regularly. Use sand or salt on walking paths during winter. Clean up any spills in your garage right away. This includes oil or grease spills. What can I do in the bathroom? Use night lights. Install grab bars by the toilet and in the tub and shower. Do not use towel bars as grab bars. Use non-skid mats or decals in the tub or shower. If you need to sit down in the shower, use a plastic, non-slip stool. Keep the floor dry. Clean up any water that spills on the floor as soon as it happens. Remove soap buildup in the tub or shower regularly. Attach bath mats securely with double-sided  non-slip rug tape. Do not have throw rugs and other things on the floor that can make you trip. What can I do in the bedroom? Use night lights. Make sure that you have a light by your bed that is easy to reach. Do not use any sheets or blankets that are too big for your bed. They should not hang down onto the floor. Have a firm chair that has side arms. You can use this for support while you get dressed. Do not have throw rugs and other things on the floor that can make you trip. What can I do in the kitchen? Clean up any spills right away. Avoid walking on wet floors. Keep items that you use a lot in easy-to-reach places. If you need to reach something above you, use a strong step stool that has a grab bar. Keep electrical cords out of the way. Do not use floor polish or wax that makes floors slippery. If you must use wax, use non-skid floor wax. Do not have throw rugs and other things on the floor that can make you trip. What can I do with my stairs? Do not leave any items on the stairs. Make sure that there are handrails on both sides of the stairs and use them. Fix handrails that are broken or loose. Make sure that handrails are as long as the stairways. Check any carpeting to make sure that it  is firmly attached to the stairs. Fix any carpet that is loose or worn. Avoid having throw rugs at the top or bottom of the stairs. If you do have throw rugs, attach them to the floor with carpet tape. Make sure that you have a light switch at the top of the stairs and the bottom of the stairs. If you do not have them, ask someone to add them for you. What else can I do to help prevent falls? Wear shoes that: Do not have high heels. Have rubber bottoms. Are comfortable and fit you well. Are closed at the toe. Do not wear sandals. If you use a stepladder: Make sure that it is fully opened. Do not climb a closed stepladder. Make sure that both sides of the stepladder are locked into place. Ask  someone to hold it for you, if possible. Clearly mark and make sure that you can see: Any grab bars or handrails. First and last steps. Where the edge of each step is. Use tools that help you move around (mobility aids) if they are needed. These include: Canes. Walkers. Scooters. Crutches. Turn on the lights when you go into a dark area. Replace any light bulbs as soon as they burn out. Set up your furniture so you have a clear path. Avoid moving your furniture around. If any of your floors are uneven, fix them. If there are any pets around you, be aware of where they are. Review your medicines with your doctor. Some medicines can make you feel dizzy. This can increase your chance of falling. Ask your doctor what other things that you can do to help prevent falls. This information is not intended to replace advice given to you by your health care provider. Make sure you discuss any questions you have with your health care provider. Document Released: 05/04/2009 Document Revised: 12/14/2015 Document Reviewed: 08/12/2014 Elsevier Interactive Patient Education  2017 [employer].

## 2021-09-27 ENCOUNTER — Other Ambulatory Visit (INDEPENDENT_AMBULATORY_CARE_PROVIDER_SITE_OTHER): Payer: PPO

## 2021-09-27 ENCOUNTER — Other Ambulatory Visit: Payer: Self-pay

## 2021-09-27 DIAGNOSIS — E039 Hypothyroidism, unspecified: Secondary | ICD-10-CM | POA: Diagnosis not present

## 2021-09-27 DIAGNOSIS — E559 Vitamin D deficiency, unspecified: Secondary | ICD-10-CM

## 2021-09-27 DIAGNOSIS — Z79899 Other long term (current) drug therapy: Secondary | ICD-10-CM

## 2021-09-27 DIAGNOSIS — Q842 Other congenital malformations of hair: Secondary | ICD-10-CM | POA: Diagnosis not present

## 2021-09-27 DIAGNOSIS — E061 Subacute thyroiditis: Secondary | ICD-10-CM | POA: Diagnosis not present

## 2021-09-27 DIAGNOSIS — E042 Nontoxic multinodular goiter: Secondary | ICD-10-CM | POA: Diagnosis not present

## 2021-09-27 DIAGNOSIS — R7989 Other specified abnormal findings of blood chemistry: Secondary | ICD-10-CM | POA: Diagnosis not present

## 2021-09-27 DIAGNOSIS — E7800 Pure hypercholesterolemia, unspecified: Secondary | ICD-10-CM | POA: Diagnosis not present

## 2021-09-27 DIAGNOSIS — D440 Neoplasm of uncertain behavior of thyroid gland: Secondary | ICD-10-CM | POA: Diagnosis not present

## 2021-09-27 LAB — LIPID PANEL
Cholesterol: 195 mg/dL (ref 0–200)
HDL: 64 mg/dL (ref >39.00)
LDL Cholesterol: 113 mg/dL — ABNORMAL HIGH (ref 0–99)
NonHDL: 130.61
Total CHOL/HDL Ratio: 3
Triglycerides: 86 mg/dL (ref 0.0–149.0)
VLDL: 17.2 mg/dL (ref 0.0–40.0)

## 2021-09-27 LAB — CBC WITH DIFFERENTIAL/PLATELET
Basophils Absolute: 0 10*3/uL (ref 0.0–0.1)
Basophils Relative: 0.4 % (ref 0.0–3.0)
Eosinophils Absolute: 0.3 10*3/uL (ref 0.0–0.7)
Eosinophils Relative: 6.2 % — ABNORMAL HIGH (ref 0.0–5.0)
HCT: 36.4 % (ref 36.0–46.0)
Hemoglobin: 12.2 g/dL (ref 12.0–15.0)
Lymphocytes Relative: 27.4 % (ref 12.0–46.0)
Lymphs Abs: 1.2 10*3/uL (ref 0.7–4.0)
MCHC: 33.4 g/dL (ref 30.0–36.0)
MCV: 91.9 fl (ref 78.0–100.0)
Monocytes Absolute: 0.6 10*3/uL (ref 0.1–1.0)
Monocytes Relative: 13.5 % — ABNORMAL HIGH (ref 3.0–12.0)
Neutro Abs: 2.3 10*3/uL (ref 1.4–7.7)
Neutrophils Relative %: 52.5 % (ref 43.0–77.0)
Platelets: 195 10*3/uL (ref 150.0–400.0)
RBC: 3.96 Mil/uL (ref 3.87–5.11)
RDW: 13.5 % (ref 11.5–15.5)
WBC: 4.4 10*3/uL (ref 4.0–10.5)

## 2021-09-27 LAB — BASIC METABOLIC PANEL
BUN: 15 mg/dL (ref 6–23)
CO2: 28 mEq/L (ref 19–32)
Calcium: 9.3 mg/dL (ref 8.4–10.5)
Chloride: 104 mEq/L (ref 96–112)
Creatinine, Ser: 0.68 mg/dL (ref 0.40–1.20)
GFR: 85.36 mL/min (ref >60.00)
Glucose, Bld: 68 mg/dL — ABNORMAL LOW (ref 70–99)
Potassium: 3.9 mEq/L (ref 3.5–5.1)
Sodium: 139 mEq/L (ref 135–145)

## 2021-09-27 LAB — HEPATIC FUNCTION PANEL
ALT: 13 U/L (ref 0–35)
AST: 18 U/L (ref 0–37)
Albumin: 4.3 g/dL (ref 3.5–5.2)
Alkaline Phosphatase: 80 U/L (ref 39–117)
Bilirubin, Direct: 0.1 mg/dL (ref 0.0–0.3)
Total Bilirubin: 0.7 mg/dL (ref 0.2–1.2)
Total Protein: 6 g/dL (ref 6.0–8.3)

## 2021-09-27 LAB — VITAMIN D 25 HYDROXY (VIT D DEFICIENCY, FRACTURES): VITD: 73.59 ng/mL (ref 30.00–100.00)

## 2021-09-27 LAB — IRON: Iron: 98 ug/dL (ref 42–145)

## 2021-09-27 LAB — TSH: TSH: 0.39 u[IU]/mL (ref 0.35–5.50)

## 2021-09-27 LAB — FERRITIN: Ferritin: 282.4 ng/mL (ref 10.0–291.0)

## 2021-09-27 LAB — VITAMIN B12: Vitamin B-12: 1040 pg/mL — ABNORMAL HIGH (ref 211–911)

## 2021-10-03 ENCOUNTER — Other Ambulatory Visit: Payer: Self-pay

## 2021-10-03 ENCOUNTER — Encounter: Payer: Self-pay | Admitting: Family Medicine

## 2021-10-03 ENCOUNTER — Ambulatory Visit (INDEPENDENT_AMBULATORY_CARE_PROVIDER_SITE_OTHER): Payer: PPO | Admitting: Family Medicine

## 2021-10-03 VITALS — BP 118/70 | HR 55 | Temp 97.4°F | Ht 64.0 in | Wt 143.0 lb

## 2021-10-03 DIAGNOSIS — E039 Hypothyroidism, unspecified: Secondary | ICD-10-CM

## 2021-10-03 DIAGNOSIS — E161 Other hypoglycemia: Secondary | ICD-10-CM

## 2021-10-03 DIAGNOSIS — Z0000 Encounter for general adult medical examination without abnormal findings: Secondary | ICD-10-CM | POA: Diagnosis not present

## 2021-10-03 DIAGNOSIS — E7800 Pure hypercholesterolemia, unspecified: Secondary | ICD-10-CM | POA: Diagnosis not present

## 2021-10-03 DIAGNOSIS — E559 Vitamin D deficiency, unspecified: Secondary | ICD-10-CM

## 2021-10-03 NOTE — Assessment & Plan Note (Signed)
Blood glucose was low for fasting labs recently ?Patient has not had any symptoms ?Her current stomach issues have made it hard to eat regular protein ?Discussed protein sources in detail ?She would be interested in a nutrition consult if possible but plans to check with insurance to see if this is covered first ?

## 2021-10-03 NOTE — Assessment & Plan Note (Signed)
Lab Results  ?Component Value Date  ? TSH 0.39 09/27/2021  ? ?Under care of endocrinology ?Currently taking levothyroxine 50 mcg daily ?

## 2021-10-03 NOTE — Assessment & Plan Note (Signed)
Disc goals for lipids and reasons to control them ?Rev last labs with pt ?Rev low sat fat diet in detail ?Improved LDL down to 113 ?HDL is very good at 64 ?Patient is statin intolerant and this is diet controlled currently ?

## 2021-10-03 NOTE — Assessment & Plan Note (Signed)
Reviewed health habits including diet and exercise and skin cancer prevention ?Reviewed appropriate screening tests for age  ?Also reviewed health mt list, fam hx and immunization status , as well as social and family history   ?See HPI ?Labs reviewed  ?Weight loss noted from lack of smell and taste after illness ?Colonoscopy is up-to-date and due in September 2025 ?Mammogram is up-to-date and due in October 2023 ?Bone density test will be due in October 2023 ?No falls or fractures, taking vitamin D ?Unable to exercise due to orthopedic problems ?Vitamin B12 is high asked her to cut dose in half ?Declines flu shot ?

## 2021-10-03 NOTE — Assessment & Plan Note (Signed)
Vitamin D level is normal at 73.5 ?Encouraged her to continue her current 2000 IU of vitamin D3 daily ?Discussed importance to bone and overall health ?

## 2021-10-03 NOTE — Progress Notes (Signed)
? ?Subjective:  ? ? Patient ID: Morgan [name], female    DOB: March 16, 1946, 76 y.o.   MRN: [id number] ? ?This visit occurred during the SARS-CoV-2 public health emergency.  Safety protocols were in place, including screening questions prior to the visit, additional usage of staff PPE, and extensive cleaning of exam room while observing appropriate contact time as indicated for disinfecting solutions.  ? ?HPI ?Here for health maintenance exam and to review chronic medical problems   ? ?Wt Readings from Last 3 Encounters:  ?10/03/21 143 lb (64.9 kg)  ?09/26/21 142 lb (64.4 kg)  ?09/21/21 144 lb 3.2 oz (65.4 kg)  ? ?24.55 kg/m? ?Stays home with husband  ?He is not well  ?Has to care for him  ? ?Overall lost 45 lb  ?Lost smell and taste since covid  ? ? ?Had amw on 3/8 ?Reviewed  ? ?Colonoscopy up-to-date until September 2025 ? ?Mammogram up-to-date from October ?Self breast exam: no lumps  ? ? ?Bone density test due October 2023 ?Falls : none  ?Fractures : none  ?Supplements  -vitamin D 2000 iu  ?Exercise  -  is active  (won't consider it at all, back pain and takes care of her husband)  ?D level 73.5 ? ? ?B12 a little high ?Lab Results  ?Component Value Date  ? VITAMINB12 1,040 (H) 09/27/2021  ? ? ?Flu shot : declines  ? ? ?Hypothyroidism with h/o thyroid nodules  ?Pt has no clinical changes ?No change in energy level/ hair or skin/ edema and no tremor ?Lab Results  ?Component Value Date  ? TSH 0.39 09/27/2021  ?   ?Sees endocrinology - reduced her dose  ?Levothyroxine 50 mcg daily  -so far not as much energy  ? ? ?Hyperlipidemia ?Lab Results  ?Component Value Date  ? CHOL 195 09/27/2021  ? CHOL 205 (H) 07/31/2020  ? CHOL 202 (H) 02/02/2019  ? ?Lab Results  ?Component Value Date  ? HDL 64.00 09/27/2021  ? HDL 50.90 07/31/2020  ? HDL 50.30 02/02/2019  ? ?Lab Results  ?Component Value Date  ? LDLCALC 113 (H) 09/27/2021  ? LDLCALC 132 (H) 07/31/2020  ? LDLCALC 135 (H) 02/02/2019  ? ?Lab Results  ?Component Value Date  ?  TRIG 86.0 09/27/2021  ? TRIG 112.0 07/31/2020  ? TRIG 86.0 02/02/2019  ? ?Lab Results  ?Component Value Date  ? CHOLHDL 3 09/27/2021  ? CHOLHDL 4 07/31/2020  ? CHOLHDL 4 02/02/2019  ? ?Lab Results  ?Component Value Date  ? LDLDIRECT 148.8 10/09/2010  ? LDLDIRECT 166.4 08/09/2010  ? LDLDIRECT 144.5 01/29/2008  ? ?Diet controlled ?Statin intolerant  ?This is improved , eating a lot less cholesterol  ? ? ? ? ?Had elevated ferritin in the past ?Lab Results  ?Component Value Date  ? IRON 98 09/27/2021  ? TIBC 353 09/18/2021  ? FERRITIN 282.4 09/27/2021  ? ?Improved now  ?Lab Results  ?Component Value Date  ? WBC 4.4 09/27/2021  ? HGB 12.2 09/27/2021  ? HCT 36.4 09/27/2021  ? MCV 91.9 09/27/2021  ? PLT 195.0 09/27/2021  ? ?Elevated  LFTbefore ccy ?Lab Results  ?Component Value Date  ? ALT 13 09/27/2021  ? AST 18 09/27/2021  ? ALKPHOS 80 09/27/2021  ? BILITOT 0.7 09/27/2021  ?  ?GI is treating for bacterial overgrowth with flagyl  ?Another episode of this  ? ?Saw hematology for ferritin  ?It went up and then down  ?Has more in depth blood work/DNA labs  ? ? ?  Glucose was low  ?She cannot eat peanut butter  ?Cannot eat much egg  ? ?Patient Active Problem List  ? Diagnosis Date Noted  ? Fasting hypoglycemia 10/03/2021  ? S/P laparoscopic cholecystectomy 08/22/2021  ? Elevated ferritin 06/25/2021  ? Gallstones 06/25/2021  ? Elevated LFTs 06/25/2021  ? Weight loss 06/25/2021  ? COVID-19 virus infection 02/20/2021  ? Current use of proton pump inhibitor 07/04/2020  ? Change in stool caliber 07/04/2020  ? Idiopathic small fiber sensory neuropathy 04/12/2020  ? Belching 02/09/2020  ? Vitamin D deficiency 10/05/2019  ? Neck pain 03/02/2019  ? Colon cancer screening 02/09/2019  ? Snoring 08/06/2017  ? Nasal congestion 08/06/2017  ? Paresthesia of foot, bilateral 08/12/2016  ? Encounter for Medicare annual wellness exam 04/13/2014  ? Allergic rhinitis 04/13/2014  ? Routine general medical examination at a health care facility  04/07/2014  ? Multiple thyroid nodules 01/24/2014  ? History of small bowel obstruction 01/20/2014  ? DDD (degenerative disc disease), cervical   ? DDD (degenerative disc disease), lumbar   ? Spinal stenosis   ? TINNITUS 06/08/2010  ? Hypothyroidism 04/18/2010  ? BRADYCARDIA 05/31/2008  ? GERD 05/03/2008  ? DIVERTICULOSIS OF COLON 04/26/2008  ? HEPATITIS, HX OF 04/26/2008  ? COLONIC POLYPS, HYPERPLASTIC, HX OF 04/26/2008  ? HEMORRHOIDS, INTERNAL 01/11/2008  ? DERMATITIS, SEBORRHEIC NEC 01/28/2007  ? Hyperlipidemia 01/19/2007  ? IBS 01/19/2007  ? OVERACTIVE BLADDER 01/19/2007  ? POSTMENOPAUSAL STATUS 01/19/2007  ? ECZEMA 01/19/2007  ? Osteoarthritis 01/19/2007  ? ?Past Medical History:  ?Diagnosis Date  ? Allergy   ? Bradycardia   ? Collagen vascular disease ([place])   ? Connective tissue disease ([place])   ? COVID-19   ? DDD (degenerative disc disease), cervical   ? DDD (degenerative disc disease), lumbar   ? Endometrial polyp   ? not resolved with D&C (following)  ? GERD (gastroesophageal reflux disease)   ? gas/belching issues-was on protonix for short time-not since 6 months  ? HLD (hyperlipidemia)   ? Mixed connective tissue disease ([place]) diagnosed 5 years ago  ? no problems since diagnosis  ? Multiple thyroid nodules 01/24/2014  ? Obesity   ? Osteoarthritis of cervical spine   ? PONV (postoperative nausea and vomiting)   ? Renal calculi   ? Spinal stenosis   ? ?Past Surgical History:  ?Procedure Laterality Date  ? APPENDECTOMY    ? CHOLECYSTECTOMY N/A 08/22/2021  ? Procedure: LAPAROSCOPIC CHOLECYSTECTOMY;  Surgeon: Coralie Keens, MD;  Location: [facility];  Service: General;  Laterality: N/A;  ? COLONOSCOPY    ? DILATION AND CURETTAGE OF UTERUS    ? fibroids  ? DILATION AND CURETTAGE OF UTERUS  01/2005  ? endometrial polyp  ? ENDOMETRIAL BIOPSY  2008  ? fibroid  ? HAND SURGERY    ? carpal tunnel release bilateral Dr. Daylene Katayama  ? hysteroscopic resection     ? polyps of PMB  ? TONSILLECTOMY    ? TUBAL  LIGATION    ? 1973  ? UPPER GASTROINTESTINAL ENDOSCOPY    ? ?Social History  ? ?Tobacco Use  ? Smoking status: Former  ?  Types: Cigarettes  ?  Quit date: 07/22/1965  ?  Years since quitting: 56.2  ? Smokeless tobacco: Never  ? Tobacco comments:  ?  Smoked for 2 years as a teen  ?Vaping Use  ? Vaping Use: Never used  ?Substance Use Topics  ? Alcohol use: No  ? Drug use: No  ? ?Family History  ?  Problem Relation Age of Onset  ? Dementia Mother   ? Coronary artery disease Father   ? Heart attack Father   ?     deceased at 44  ? Diabetes Maternal Grandmother   ? Goiter Maternal Grandmother   ? Cancer Grandchild   ?     rare type (dying at age 49)  ? Diabetes Cousin   ?     multiple  ? Colon cancer Neg Hx   ? Esophageal cancer Neg Hx   ? Stomach cancer Neg Hx   ? Rectal cancer Neg Hx   ? Colon polyps Neg Hx   ? ?Allergies  ?Allergen Reactions  ? Codeine Nausea And Vomiting  ? Hydrocodone-Acetaminophen   ?  REACTION: reaction not known  ? Penicillins Hives  ? ?Current Outpatient Medications on File Prior to Visit  ?Medication Sig Dispense Refill  ? Cholecalciferol (VITAMIN D3) 25 MCG (1000 UT) CAPS Take 1 capsule by mouth daily.    ? clobetasol (OLUX) 0.05 % topical foam Apply topically 2 (two) times daily. 50 g 5  ? Cyanocobalamin (VITAMIN B12) 500 MCG TABS Take 1 tablet by mouth daily.    ? halobetasol (ULTRAVATE) 0.05 % cream APPLY TOPICALLY 2 TIMES DAILY AS NEEDED FOR UP TO 14 DAYS. AVOID FACE, GROIN ANDAXILLA 30 g 0  ? hydrocortisone valerate cream (WESTCORT) 0.2 % Apply topically.    ? Hyoscyamine Sulfate SL (LEVSIN/SL) 0.125 MG SUBL Place 1 tablet under the tongue daily as needed. 30 tablet 1  ? levothyroxine (SYNTHROID, LEVOTHROID) 50 MCG tablet Take 50 mcg 3 times a week (Mon, Wed, Fri),25 mcg 4 times a week (Tues, [place], Sat, Sun)    ? metroNIDAZOLE (FLAGYL) 500 MG tablet Take 1 tablet (500 mg total) by mouth 3 (three) times daily. 30 tablet 0  ? polyethylene glycol powder (GLYCOLAX/MIRALAX) 17 GM/SCOOP powder  Take 17 g by mouth as needed.    ? traMADol (ULTRAM) 50 MG tablet Take 1-2 tablets (50-100 mg total) by mouth every 6 (six) hours as needed. 20 tablet 0  ? triamcinolone cream (KENALOG) 0.1 % Apply topically.    ? va

## 2021-10-03 NOTE — Patient Instructions (Addendum)
Cut your vitamin B12 500 mcg daily - buy this when you run out  ?Take the 1000 mg every other day for now  ? ?Continue your daily vitamin D  ? ?Eat frequent small meals with protein  ?You may need to looks for protein drink supplements  ? ? ?Call your insurance company and see if they cover nutritionist consult (to discuss protein sources)  ? ? ? ? ? ? ? ?

## 2021-10-04 ENCOUNTER — Ambulatory Visit: Payer: PPO | Admitting: Nurse Practitioner

## 2021-10-04 LAB — HEMOCHROMATOSIS DNA-PCR(C282Y,H63D)

## 2021-10-08 ENCOUNTER — Telehealth: Payer: Self-pay

## 2021-10-08 ENCOUNTER — Telehealth: Payer: Self-pay | Admitting: Physician Assistant

## 2021-10-08 NOTE — Telephone Encounter (Signed)
I reviewed hemochromatosis DNA test results with Ms. Morgan [name]. Findings show no evidence of hereditary hemochromatosis. Repeat ferritin levels from 09/27/2021 showed marked improvement from 512 to 282 ng/mL. Likely cause is inflammatory process. No further hematologic workup is recommended. Patient will return to our clinic as needed. She expressed understanding of the plan provided.  ?

## 2021-10-08 NOTE — Telephone Encounter (Signed)
Dr Laurence Ferrari pt- Pt called she treated her chest with 5FU/calcipotriene cream x 7 days she stopped on Friday, pt report her chest is painful she is using otc Cerave moisturizer with no help, she would like to know if she can use a rx topical she has at home- triamcinolone cream or halobetasol cream or clobetasol foam or hydrocortisone 0.2% cream  ?

## 2021-10-23 ENCOUNTER — Encounter: Payer: Self-pay | Admitting: Nurse Practitioner

## 2021-10-23 ENCOUNTER — Ambulatory Visit: Payer: PPO | Admitting: Nurse Practitioner

## 2021-10-23 VITALS — BP 130/80 | HR 52 | Ht 64.0 in | Wt 142.0 lb

## 2021-10-23 DIAGNOSIS — R142 Eructation: Secondary | ICD-10-CM

## 2021-10-23 MED ORDER — FAMOTIDINE 20 MG PO TABS: 20.0000 mg | ORAL_TABLET | Freq: Every day | ORAL | 2 refills | Status: DC

## 2021-10-23 NOTE — Progress Notes (Signed)
? ? ? ?10/23/2021 ?Morgan [name] ?[id number] ?Oct 19, 1945 ? ? ?Chief Complaint: Burping up gas, gurgling stomach noises  ? ?History of Present Illness: Morgan [name] is a 76 year old female with a past medical history of GERD and suspected SIBO.  She was last seen in office by Dr. Silverio Decamp 09/21/2021 for further evaluation regarding IBS and suspected SIBO with generalized abdominal cramping, bloating and excessive gas.  She was prescribed a course of Xifaxan to treat empirically for SIBO, however, she did not wish to take this antibiotic as she read literature that indicated increased risk of C. difficile with Xifaxan.  She also had past concerns regarding the cost of Xifaxan.  She contacted our office following her 09/21/2021 appointment and she requested a prescription for Flagyl which she took in 2017 which was well-tolerated her abdominal bloat symptoms improved.  She was prescribed Metronidazole 500 mg 3 times daily for 10 days which she completed.  She reported her gas per the rectum improved following the course of Flagyl but she continues to burp up gas from above.  She is taking a probiotic daily for the past several years.  No nausea or vomiting.  She describes having early satiety since she had COVID with diminished taste sensation.  She is also concerned regarding the gurgling noise her stomach makes at nighttime.  No abdominal pain.  She is passing a fairly normal bowel movement every other day.  No weight loss.  She is drinking at least 6 glasses of water.  She eats a high-fiber diet.  No other complaints at this time.  ? ?She was recently seen by hematology due to having an elevated ferritin and CRP levels.  Normal iron and LFTs.  Hemochromatosis DNA results were negative, no evidence of hereditary hemochromatosis.  No further hematological work-up was recommended. ? ?CT abdomen and pelvis September 22, 2020: Unremarkable with no acute pathology ?   ?EGD March 17, 2020: LA grade B reflux esophagitis,  gastroesophageal flap valve Hill grade 3, small hiatal hernia. ?  ?Colonoscopy April 13, 2019: Left-sided diverticulosis and hemorrhoids otherwise unremarkable exam ? ?Past Medical History:  ?Diagnosis Date  ? Allergy   ? Bradycardia   ? Collagen vascular disease ([place])   ? Connective tissue disease ([place])   ? COVID-19   ? DDD (degenerative disc disease), cervical   ? DDD (degenerative disc disease), lumbar   ? Endometrial polyp   ? not resolved with D&C (following)  ? GERD (gastroesophageal reflux disease)   ? gas/belching issues-was on protonix for short time-not since 6 months  ? HLD (hyperlipidemia)   ? Mixed connective tissue disease ([place]) diagnosed 5 years ago  ? no problems since diagnosis  ? Multiple thyroid nodules 01/24/2014  ? Obesity   ? Osteoarthritis of cervical spine   ? PONV (postoperative nausea and vomiting)   ? Renal calculi   ? Spinal stenosis   ? ?Current Outpatient Medications on File Prior to Visit  ?Medication Sig Dispense Refill  ? Cholecalciferol (VITAMIN D3) 25 MCG (1000 UT) CAPS Take 1 capsule by mouth daily.    ? clobetasol (OLUX) 0.05 % topical foam Apply topically 2 (two) times daily. 50 g 5  ? Cyanocobalamin (VITAMIN B12) 500 MCG TABS Take 1 tablet by mouth daily.    ? halobetasol (ULTRAVATE) 0.05 % cream APPLY TOPICALLY 2 TIMES DAILY AS NEEDED FOR UP TO 14 DAYS. AVOID FACE, GROIN ANDAXILLA 30 g 0  ? hydrocortisone valerate cream (WESTCORT) 0.2 % Apply topically.    ?  Hyoscyamine Sulfate SL (LEVSIN/SL) 0.125 MG SUBL Place 1 tablet under the tongue daily as needed. 30 tablet 1  ? levothyroxine (SYNTHROID, LEVOTHROID) 50 MCG tablet Take 50 mcg 3 times a week (Mon, Wed, Fri),25 mcg 4 times a week (Tues, [place], Sat, Sun)    ? metroNIDAZOLE (FLAGYL) 500 MG tablet Take 1 tablet (500 mg total) by mouth 3 (three) times daily. 30 tablet 0  ? polyethylene glycol powder (GLYCOLAX/MIRALAX) 17 GM/SCOOP powder Take 17 g by mouth as needed.    ? traMADol (ULTRAM) 50 MG tablet Take 1-2 tablets  (50-100 mg total) by mouth every 6 (six) hours as needed. 20 tablet 0  ? triamcinolone cream (KENALOG) 0.1 % Apply topically.    ? valACYclovir (VALTREX) 1000 MG tablet TAKE 2 TABLET BY MOUTH AT FIRST ONSET OFSYMPTOMS OF COLD SORES, THEN TAKE 2 TABLETS BY MOUTH 12 HOURS LATER 30 tablet 10  ? ?No current facility-administered medications on file prior to visit.  ? ?Allergies  ?Allergen Reactions  ? Codeine Nausea And Vomiting  ? Hydrocodone-Acetaminophen   ?  REACTION: reaction not known  ? Penicillins Hives  ? ?Current Medications, Allergies, Past Medical History, Past Surgical History, Family History and Social History were reviewed in [employer] record. ? ?Review of Systems:   ?Constitutional: Negative for fever, sweats, chills or weight loss.  ?Respiratory: Negative for shortness of breath.   ?Cardiovascular: Negative for chest pain, palpitations and leg swelling.  ?Gastrointestinal: See HPI.  ?Musculoskeletal: Negative for back pain or muscle aches.  ?Neurological: Negative for dizziness, headaches or paresthesias.  ? ?Physical Exam: ?BP 130/80   Pulse (!) 52   Ht '5\' 4"'$  (1.626 m)   Wt 142 lb (64.4 kg)   LMP 07/22/1989 (Approximate)   SpO2 97%   BMI 24.37 kg/m?  ? ?Wt Readings from Last 3 Encounters:  ?10/23/21 142 lb (64.4 kg)  ?10/03/21 143 lb (64.9 kg)  ?09/26/21 142 lb (64.4 kg)  ?  ?General: 76 year old female in no acute distress ?Head: Normocephalic and atraumatic. ?Eyes: No scleral icterus. Conjunctiva pink . ?Ears: Normal auditory acuity. ?Lungs: Clear throughout to auscultation. ?Heart: Regular rate and rhythm, no murmur. ?Abdomen: Soft, nontender and nondistended. No masses or hepatomegaly. Normal bowel sounds x 4 quadrants.  ?Rectal: Deferred. ?Musculoskeletal: Symmetrical with no gross deformities. ?Extremities: No edema. ?Neurological: Alert oriented x 4. No focal deficits.  ?Psychological: Alert and cooperative. Normal mood and affect ? ?Assessment and  Recommendations: ? ?10) 76 year old female with a history of GERD with increased eructation.  Mild early satiety.  EGD 03/17/2020 showed reflux esophagitis. ?-Famotidine '20mg'$  one po QHS, if no improvement in 2 weeks increase Famotidine to 40 mg nightly ?-FODMAP handout ?-Reduce probiotic to every other day then discontinue when supply runs out ?-May consider gastric emptying study for burping and early satiety symptoms persist or worsen ?-Follow-up as needed ? ?2) Abdominal bloat and flatulence significantly improved after taking Flagyl 500 mg 1 3 times daily for 10 days for suspected SIBO.  ?-FODMAP handout ?-Reduce probiotic to every other day then discontinue when supply runs out ? ?3) Colon cancer screening. Colonoscopy 04/13/2019 identified mild diverticulosis to the descending and sigmoid colon, no polyps. ?-Next colonoscopy due 03/2024 ? ?

## 2021-10-23 NOTE — Patient Instructions (Addendum)
1) Take Famotidine '20mg'$  one tab at bed time for burping. If no improvement in 2 weeks increase Famotidine to two tabs at bed time ? ?2) Reduce probiotic to once every other day until you run out of your current supply  ? ?3) FODMAP handout  ? ?4) Follow up as needed  ? ?Low-FODMAP Eating Plan ?FODMAP stands for fermentable oligosaccharides, disaccharides, monosaccharides, and polyols. These are sugars that are hard for some people to digest. A low-FODMAP eating plan may help some people who have irritable bowel syndrome (IBS) and certain other bowel (intestinal) diseases to manage their symptoms. ?This meal plan can be complicated to follow. Work with a diet and nutrition specialist (dietitian) to make a low-FODMAP eating plan that is right for you. A dietitian can help make sure that you get enough nutrition from this diet. ?What are tips for following this plan? ?Reading food labels ?Check labels for hidden FODMAPs such as: ?High-fructose syrup. ?Honey. ?Agave. ?Natural fruit flavors. ?Onion or garlic powder. ?Choose low-FODMAP foods that contain 3-4 grams of fiber per serving. ?Check food labels for serving sizes. Eat only one serving at a time to make sure FODMAP levels stay low. ?Shopping ?Shop with a list of foods that are recommended on this diet and make a meal plan. ?Meal planning ?Follow a low-FODMAP eating plan for up to 6 weeks, or as told by your health care provider or dietitian. ?To follow the eating plan: ?Eliminate high-FODMAP foods from your diet completely. Choose only low-FODMAP foods to eat. You will do this for 2-6 weeks. ?Gradually reintroduce high-FODMAP foods into your diet one at a time. Most people should wait a few days before introducing the next new high-FODMAP food into their meal plan. Your dietitian can recommend how quickly you may reintroduce foods. ?Keep a daily record of what and how much you eat and drink. Make note of any symptoms that you have after eating. ?Review your daily  record with a dietitian regularly to identify which foods you can eat and which foods you should avoid. ?General tips ?Drink enough fluid each day to keep your urine pale yellow. ?Avoid processed foods. These often have added sugar and may be high in FODMAPs. ?Avoid most dairy products, whole grains, and sweeteners. ?Work with a dietitian to make sure you get enough fiber in your diet. ?Avoid high FODMAP foods at meals to manage symptoms. ?Recommended foods ?Fruits ?Bananas, oranges, tangerines, lemons, limes, blueberries, raspberries, strawberries, grapes, cantaloupe, honeydew melon, kiwi, papaya, passion fruit, and pineapple. Limited amounts of dried cranberries, banana chips, and shredded coconut. ?Vegetables ?Eggplant, zucchini, cucumber, peppers, green beans, bean sprouts, lettuce, arugula, kale, Swiss chard, spinach, collard greens, bok choy, summer squash, potato, and tomato. Limited amounts of corn, carrot, and sweet potato. Green parts of scallions. ?Grains ?Gluten-free grains, such as rice, oats, buckwheat, quinoa, corn, polenta, and millet. Gluten-free pasta, bread, or cereal. Rice noodles. Corn tortillas. ?Meats and other proteins ?Unseasoned beef, pork, poultry, or fish. Eggs. Berniece Salines. Tofu (firm) and tempeh. Limited amounts of nuts and seeds, such as almonds, walnuts, [place] nuts, pecans, peanuts, nut butters, pumpkin seeds, chia seeds, and sunflower seeds. ?Dairy ?Lactose-free milk, yogurt, and kefir. Lactose-free cottage cheese and ice cream. Non-dairy milks, such as almond, coconut, hemp, and rice milk. Non-dairy yogurt. Limited amounts of goat cheese, brie, mozzarella, parmesan, swiss, and other hard cheeses. ?Fats and oils ?Butter-free spreads. Vegetable oils, such as olive, canola, and sunflower oil. ?Seasoning and other foods ?Artificial sweeteners with names that do not  end in "ol," such as aspartame, saccharine, and stevia. Maple syrup, white table sugar, raw sugar, brown sugar, and molasses.  Mayonnaise, soy sauce, and tamari. Fresh basil, coriander, parsley, rosemary, and thyme. ?Beverages ?Water and mineral water. Sugar-sweetened soft drinks. Small amounts of orange juice or cranberry juice. Black and green tea. Most dry wines. Coffee. ?The items listed above may not be a complete list of foods and beverages you can eat. Contact a dietitian for more information. ?Foods to avoid ?Fruits ?Fresh, dried, and juiced forms of apple, pear, watermelon, peach, plum, cherries, apricots, blackberries, boysenberries, figs, nectarines, and mango. Avocado. ?Vegetables ?Chicory root, artichoke, asparagus, cabbage, snow peas, Brussels sprouts, broccoli, sugar snap peas, mushrooms, celery, and cauliflower. Onions, garlic, leeks, and the white part of scallions. ?Grains ?Wheat, including kamut, durum, and semolina. Barley and bulgur. Couscous. Wheat-based cereals. Wheat noodles, bread, crackers, and pastries. ?Meats and other proteins ?Fried or fatty meat. Sausage. Cashews and pistachios. Soybeans, baked beans, black beans, chickpeas, kidney beans, fava beans, navy beans, lentils, black-eyed peas, and split peas. ?Dairy ?Milk, yogurt, ice cream, and soft cheese. Cream and sour cream. Milk-based sauces. Custard. Buttermilk. Soy milk. ?Seasoning and other foods ?Any sugar-free gum or candy. Foods that contain artificial sweeteners such as sorbitol, mannitol, isomalt, or xylitol. Foods that contain honey, high-fructose corn syrup, or agave. Bouillon, vegetable stock, beef stock, and chicken stock. Garlic and onion powder. Condiments made with onion, such as hummus, chutney, pickles, relish, salad dressing, and salsa. Tomato paste. ?Beverages ?Chicory-based drinks. Coffee substitutes. Chamomile tea. Fennel tea. Sweet or fortified wines such as port or sherry. Diet soft drinks made with isomalt, mannitol, maltitol, sorbitol, or xylitol. Apple, pear, and mango juice. Juices with high-fructose corn syrup. ?The items listed  above may not be a complete list of foods and beverages you should avoid. Contact a dietitian for more information. ?Summary ?FODMAP stands for fermentable oligosaccharides, disaccharides, monosaccharides, and polyols. These are sugars that are hard for some people to digest. ?A low-FODMAP eating plan is a short-term diet that helps to ease symptoms of certain bowel diseases. ?The eating plan usually lasts up to 6 weeks. After that, high-FODMAP foods are reintroduced gradually and one at a time. This can help you find out which foods may be causing symptoms. ?A low-FODMAP eating plan can be complicated. It is best to work with a dietitian who has experience with this type of plan. ?This information is not intended to replace advice given to you by your health care provider. Make sure you discuss any questions you have with your health care provider. ?Document Revised: 11/25/2019 Document Reviewed: 11/25/2019 ?Elsevier Patient Education ? [place]. ? ?

## 2021-10-23 NOTE — Progress Notes (Signed)
Reviewed and agree with documentation and assessment and plan. K. Veena Shequila Neglia , MD   

## 2021-11-14 DIAGNOSIS — M5416 Radiculopathy, lumbar region: Secondary | ICD-10-CM | POA: Diagnosis not present

## 2021-11-24 ENCOUNTER — Encounter (HOSPITAL_COMMUNITY): Payer: Self-pay | Admitting: Emergency Medicine

## 2021-11-24 ENCOUNTER — Emergency Department (HOSPITAL_COMMUNITY)
Admission: EM | Admit: 2021-11-24 | Discharge: 2021-11-24 | Disposition: A | Discharge status: Home / Self Care | Payer: PPO | Attending: Emergency Medicine | Admitting: Emergency Medicine

## 2021-11-24 ENCOUNTER — Emergency Department (HOSPITAL_COMMUNITY): Payer: PPO

## 2021-11-24 ENCOUNTER — Other Ambulatory Visit: Payer: Self-pay

## 2021-11-24 DIAGNOSIS — R42 Dizziness and giddiness: Secondary | ICD-10-CM | POA: Diagnosis not present

## 2021-11-24 DIAGNOSIS — R55 Syncope and collapse: Secondary | ICD-10-CM | POA: Diagnosis not present

## 2021-11-24 DIAGNOSIS — I672 Cerebral atherosclerosis: Secondary | ICD-10-CM | POA: Diagnosis not present

## 2021-11-24 DIAGNOSIS — R001 Bradycardia, unspecified: Secondary | ICD-10-CM | POA: Diagnosis not present

## 2021-11-24 DIAGNOSIS — I6521 Occlusion and stenosis of right carotid artery: Secondary | ICD-10-CM | POA: Diagnosis not present

## 2021-11-24 DIAGNOSIS — M47812 Spondylosis without myelopathy or radiculopathy, cervical region: Secondary | ICD-10-CM | POA: Diagnosis not present

## 2021-11-24 LAB — CBC
HCT: 39.8 % (ref 36.0–46.0)
Hemoglobin: 12.9 g/dL (ref 12.0–15.0)
MCH: 30.7 pg (ref 26.0–34.0)
MCHC: 32.4 g/dL (ref 30.0–36.0)
MCV: 94.8 fL (ref 80.0–100.0)
Platelets: 229 10*3/uL (ref 150–400)
RBC: 4.2 MIL/uL (ref 3.87–5.11)
RDW: 13.4 % (ref 11.5–15.5)
WBC: 6.5 10*3/uL (ref 4.0–10.5)
nRBC: 0 % (ref 0.0–0.2)

## 2021-11-24 LAB — DIFFERENTIAL
Abs Immature Granulocytes: 0.02 10*3/uL (ref 0.00–0.07)
Basophils Absolute: 0 10*3/uL (ref 0.0–0.1)
Basophils Relative: 1 %
Eosinophils Absolute: 0.1 10*3/uL (ref 0.0–0.5)
Eosinophils Relative: 2 %
Immature Granulocytes: 0 %
Lymphocytes Relative: 24 %
Lymphs Abs: 1.6 10*3/uL (ref 0.7–4.0)
Monocytes Absolute: 0.7 10*3/uL (ref 0.1–1.0)
Monocytes Relative: 11 %
Neutro Abs: 4 10*3/uL (ref 1.7–7.7)
Neutrophils Relative %: 62 %

## 2021-11-24 LAB — I-STAT CHEM 8, ED
BUN: 21 mg/dL (ref 8–23)
Calcium, Ion: 1.24 mmol/L (ref 1.15–1.40)
Chloride: 103 mmol/L (ref 98–111)
Creatinine, Ser: 0.7 mg/dL (ref 0.44–1.00)
Glucose, Bld: 90 mg/dL (ref 70–99)
HCT: 39 % (ref 36.0–46.0)
Hemoglobin: 13.3 g/dL (ref 12.0–15.0)
Potassium: 4.1 mmol/L (ref 3.5–5.1)
Sodium: 141 mmol/L (ref 135–145)
TCO2: 27 mmol/L (ref 22–32)

## 2021-11-24 LAB — COMPREHENSIVE METABOLIC PANEL
ALT: 16 U/L (ref 0–44)
AST: 19 U/L (ref 15–41)
Albumin: 3.9 g/dL (ref 3.5–5.0)
Alkaline Phosphatase: 75 U/L (ref 38–126)
Anion gap: 6 (ref 5–15)
BUN: 18 mg/dL (ref 8–23)
CO2: 26 mmol/L (ref 22–32)
Calcium: 9.2 mg/dL (ref 8.9–10.3)
Chloride: 107 mmol/L (ref 98–111)
Creatinine, Ser: 0.81 mg/dL (ref 0.44–1.00)
GFR, Estimated: >60 mL/min (ref >60)
Glucose, Bld: 103 mg/dL — ABNORMAL HIGH (ref 70–99)
Potassium: 4.2 mmol/L (ref 3.5–5.1)
Sodium: 139 mmol/L (ref 135–145)
Total Bilirubin: 1 mg/dL (ref 0.3–1.2)
Total Protein: 5.9 g/dL — ABNORMAL LOW (ref 6.5–8.1)

## 2021-11-24 LAB — APTT: aPTT: 32 seconds (ref 24–36)

## 2021-11-24 LAB — PROTIME-INR
INR: 1 (ref 0.8–1.2)
Prothrombin Time: 13.5 seconds (ref 11.4–15.2)

## 2021-11-24 MED ORDER — IOHEXOL 350 MG/ML SOLN
100.0000 mL | Freq: Once | INTRAVENOUS | Status: AC | PRN
  Administered 2021-11-24: 100 mL via INTRAVENOUS

## 2021-11-24 MED ORDER — SODIUM CHLORIDE 0.9% FLUSH
3.0000 mL | Freq: Once | INTRAVENOUS | Status: AC
  Administered 2021-11-24: 3 mL via INTRAVENOUS

## 2021-11-24 NOTE — ED Notes (Signed)
Reviewed discharge instructions with patient. Follow-up care reviewed. Patient verbalized understanding. Patient A&Ox4, VSS, and ambulatory with steady gait upon discharge.  

## 2021-11-24 NOTE — ED Triage Notes (Signed)
Pt states she was mixing items while baking yesterday and her neck fell over suddenly to the left "like a rag doll".  She reports blurred vision and dizziness.  After resting she started to feel better and has no complaints now other than stiffness to the back of her neck.  No arm drift. ?

## 2021-11-24 NOTE — ED Notes (Signed)
Pt transported to CT ?

## 2021-11-24 NOTE — ED Provider Notes (Signed)
?[facility] ?Provider Note ? ? ?CSN: [id number] ?Arrival date & time: 11/24/21  1102 ? ?  ? ?History ? ?Chief Complaint  ?Patient presents with  ? Near Syncope  ? ? ?Morgan [name] is a 76 y.o. female. ? ?The history is provided by the patient.  ?Near Syncope ?This is a new problem. The current episode started yesterday. The problem has been resolved. Pertinent negatives include no chest pain, no abdominal pain, no headaches and no shortness of breath. Nothing aggravates the symptoms. Nothing relieves the symptoms. She has tried nothing for the symptoms. The treatment provided no relief.  ? ?  ? ?Home Medications ?Prior to Admission medications   ?Medication Sig Start Date End Date Taking? Authorizing Provider  ?Cholecalciferol (VITAMIN D3) 25 MCG (1000 UT) CAPS Take 1 capsule by mouth daily.    [provider]  ?clobetasol (OLUX) 0.05 % topical foam Apply topically 2 (two) times daily. 09/13/21   Moye, [place], MD  ?Cyanocobalamin (VITAMIN B12) 500 MCG TABS Take 1 tablet by mouth daily.    [provider]  ?famotidine (PEPCID) 20 MG tablet Take 1 tablet (20 mg total) by mouth at bedtime. 10/23/21   Noralyn Pick, NP  ?halobetasol (ULTRAVATE) 0.05 % cream APPLY TOPICALLY 2 TIMES DAILY AS NEEDED FOR UP TO 14 DAYS. AVOID Rodney Langton ANDAXILLA 08/07/21   Moye, [place], MD  ?hydrocortisone valerate cream (WESTCORT) 0.2 % Apply topically. 05/10/20   [provider]  ?Hyoscyamine Sulfate SL (LEVSIN/SL) 0.125 MG SUBL Place 1 tablet under the tongue daily as needed. 09/21/21   Mauri Pole, MD  ?levothyroxine (SYNTHROID, LEVOTHROID) 50 MCG tablet Take 50 mcg 3 times a week (Mon, Wed, Fri),25 mcg 4 times a week (Tues, [place], Sat, Sun)    [provider]  ?metroNIDAZOLE (FLAGYL) 500 MG tablet Take 1 tablet (500 mg total) by mouth 3 (three) times daily. 09/25/21   Mauri Pole, MD  ?polyethylene glycol powder (GLYCOLAX/MIRALAX) 17  GM/SCOOP powder Take 17 g by mouth as needed.    [provider]  ?traMADol (ULTRAM) 50 MG tablet Take 1-2 tablets (50-100 mg total) by mouth every 6 (six) hours as needed. 08/23/21   Coralie Keens, MD  ?triamcinolone cream (KENALOG) 0.1 % Apply topically. 03/20/20   [provider]  ?valACYclovir (VALTREX) 1000 MG tablet TAKE 2 TABLET BY MOUTH AT FIRST ONSET OFSYMPTOMS OF COLD SORES, THEN TAKE 2 TABLETS BY MOUTH 12 HOURS LATER 09/20/21   Brendolyn Patty, MD  ?   ? ?Allergies    ?Codeine, Hydrocodone-acetaminophen, and Penicillins   ? ?Review of Systems   ?Review of Systems  ?Respiratory:  Negative for shortness of breath.   ?Cardiovascular:  Positive for near-syncope. Negative for chest pain.  ?Gastrointestinal:  Negative for abdominal pain.  ?Neurological:  Negative for headaches.  ? ?Physical Exam ?Updated Vital Signs ?BP (!) 146/98   Pulse (!) 48   Temp 98.8 ?F (37.1 ?C) (Oral)   Resp 18   Ht '5\' 4"'$  (1.626 m)   Wt 64.4 kg   LMP 07/22/1989 (Approximate)   SpO2 98%   BMI 24.37 kg/m?  ?Physical Exam ?Vitals and nursing note reviewed.  ?Constitutional:   ?   General: She is not in acute distress. ?   Appearance: She is well-developed.  ?HENT:  ?   Head: Normocephalic and atraumatic.  ?   Nose: Nose normal.  ?   Mouth/Throat:  ?   Mouth: Mucous membranes are moist.  ?  Eyes:  ?   Extraocular Movements: Extraocular movements intact.  ?   Conjunctiva/sclera: Conjunctivae normal.  ?   Pupils: Pupils are equal, round, and reactive to light.  ?Cardiovascular:  ?   Rate and Rhythm: Normal rate and regular rhythm.  ?   Heart sounds: No murmur heard. ?Pulmonary:  ?   Effort: Pulmonary effort is normal. No respiratory distress.  ?   Breath sounds: Normal breath sounds.  ?Abdominal:  ?   Palpations: Abdomen is soft.  ?   Tenderness: There is no abdominal tenderness.  ?Musculoskeletal:     ?   General: No swelling or tenderness. Normal range of motion.  ?   Cervical back: Normal range of motion and neck  supple. No rigidity or tenderness.  ?Skin: ?   General: Skin is warm and dry.  ?   Capillary Refill: Capillary refill takes less than 2 seconds.  ?Neurological:  ?   General: No focal deficit present.  ?   Mental Status: She is alert and oriented to person, place, and time.  ?   Cranial Nerves: No cranial nerve deficit.  ?   Sensory: No sensory deficit.  ?   Motor: No weakness.  ?   Coordination: Coordination normal.  ?   Gait: Gait normal.  ?   Comments: 5+/5 strength, normal sensation, no drift, normal finger to nose finger, normal speech, normal gait, normal visual fields ?  ?Psychiatric:     ?   Mood and Affect: Mood normal.  ? ? ?ED Results / Procedures / Treatments   ?Labs ?(all labs ordered are listed, but only abnormal results are displayed) ?Labs Reviewed  ?COMPREHENSIVE METABOLIC PANEL - Abnormal; Notable for the following components:  ?    Result Value  ? Glucose, Bld 103 (*)   ? Total Protein 5.9 (*)   ? All other components within normal limits  ?PROTIME-INR  ?APTT  ?CBC  ?DIFFERENTIAL  ?I-STAT CHEM 8, ED  ? ? ?EKG ?EKG Interpretation ? ?Date/Time:  Saturday Nov 24 2021 11:52:30 EDT ?Ventricular Rate:  51 ?PR Interval:  150 ?QRS Duration: 86 ?QT Interval:  432 ?QTC Calculation: 398 ?R Axis:   -64 ?Text Interpretation: Sinus bradycardia Left anterior fascicular block Abnormal ECG When compared with ECG of 07-Feb-2020 01:03, PREVIOUS ECG IS PRESENT Confirmed by Lennice Sites ([id number]) on 11/24/2021 1:03:40 PM ? ?Radiology ?CT ANGIO HEAD NECK W WO CM ? ?Result Date: 11/24/2021 ?CLINICAL DATA:  Dizziness. EXAM: CT ANGIOGRAPHY HEAD AND NECK TECHNIQUE: Multidetector CT imaging of the head and neck was performed using the standard protocol during bolus administration of intravenous contrast. Multiplanar CT image reconstructions and MIPs were obtained to evaluate the vascular anatomy. Carotid stenosis measurements (when applicable) are obtained utilizing NASCET criteria, using the distal internal carotid diameter as  the denominator. RADIATION DOSE REDUCTION: This exam was performed according to the departmental dose-optimization program which includes automated exposure control, adjustment of the mA and/or kV according to patient size and/or use of iterative reconstruction technique. CONTRAST:  19m OMNIPAQUE IOHEXOL 350 MG/ML SOLN COMPARISON:  MRI 02/22/2006 FINDINGS: CT HEAD FINDINGS Brain: No evidence of acute infarction, mass lesion, hemorrhage, hydrocephalus or subdural collection. Generalized brain volume loss. Mild chronic small-vessel ischemic change of the hemispheric white matter. Vascular: No abnormal vascular finding. Skull: Negative Sinuses: Clear Orbits: Normal Review of the MIP images confirms the above findings CTA NECK FINDINGS Aortic arch: Aortic atherosclerosis.  Branching pattern is normal. Right carotid system: Common carotid artery widely patent to  the bifurcation. Soft plaque at the bifurcation and ICA bulb but no stenosis. Cervical ICA widely patent. Left carotid system: Common carotid artery widely patent to the bifurcation. Carotid bifurcation is normal. No stenosis. Cervical ICA widely patent. Vertebral arteries: Both vertebral artery origins are widely patent. Both vertebral arteries appear normal through the cervical region to the foramen magnum. Skeleton: Ordinary cervical spondylosis, most advanced C5-6 and C6-7. Other neck: No mass or lymphadenopathy. Upper chest: Lung apices are clear. Review of the MIP images confirms the above findings CTA HEAD FINDINGS Anterior circulation: Both internal carotid arteries are widely patent through the skull base and siphon regions. Ordinary siphon atherosclerotic calcification but no stenosis. Anterior and middle cerebral vessels are patent. No proximal stenosis, aneurysm or vascular malformation. Posterior circulation: Both vertebral arteries are widely patent through the foramen magnum to the basilar. No basilar stenosis. Posterior circulation branch  vessels are normal. Venous sinuses: Patent and normal. Anatomic variants: None significant. Review of the MIP images confirms the above findings IMPRESSION: No acute head CT finding. Mild generalized volume loss. Mild chron

## 2021-11-30 ENCOUNTER — Encounter: Payer: Self-pay | Admitting: Family Medicine

## 2021-11-30 ENCOUNTER — Ambulatory Visit (INDEPENDENT_AMBULATORY_CARE_PROVIDER_SITE_OTHER): Payer: PPO | Admitting: Family Medicine

## 2021-11-30 ENCOUNTER — Ambulatory Visit: Payer: PPO | Admitting: Gastroenterology

## 2021-11-30 VITALS — BP 128/68 | HR 59 | Temp 98.0°F | Ht 64.0 in | Wt 140.4 lb

## 2021-11-30 DIAGNOSIS — K589 Irritable bowel syndrome without diarrhea: Secondary | ICD-10-CM | POA: Diagnosis not present

## 2021-11-30 DIAGNOSIS — R55 Syncope and collapse: Secondary | ICD-10-CM | POA: Diagnosis not present

## 2021-11-30 DIAGNOSIS — R7989 Other specified abnormal findings of blood chemistry: Secondary | ICD-10-CM

## 2021-11-30 DIAGNOSIS — R001 Bradycardia, unspecified: Secondary | ICD-10-CM | POA: Diagnosis not present

## 2021-11-30 DIAGNOSIS — G4700 Insomnia, unspecified: Secondary | ICD-10-CM

## 2021-11-30 NOTE — Patient Instructions (Addendum)
I want to refer you back to cardiology  ? ?I want to make sure your slow heart rate did not cause the pre fainting episode  ? ?Be careful  ?Let us know if you have more episodes  ? ?Don't stand for long periods of time  ? ?If you get weak or dizzy- then sit down (on the floor if needed)  ? ? ? ?Protein sources : ? ?Meat ?Fish  ?Nuts or nut butter ?Soy  ?Eggs ?Dairy  ?Dried beans  ? ?Protein supplements  ? ? ?

## 2021-11-30 NOTE — Assessment & Plan Note (Signed)
Episode took her to the hospital 5/6 (no injuries) ?She felt weak and faint- managed to get to a chair (more weak than dizzy)  ?Reassuring ER w/u  ?Reviewed hospital records, lab results and studies in detail   ?Reviewed CT/angio in detail  ?Noted h/o chronic bradycardia which was asymptomatic in past (saw cardiology years ago after rate in 40s during EGD)  ?Need to rule this out as a cause ?Referral done  ?Will watch for return of symptoms and be cautious when standing or pushing herself  ? ?

## 2021-11-30 NOTE — Assessment & Plan Note (Signed)
Struggling with this ?Disc pros/cons of melatonin  ?Cannot guarantee safety (esp in light of recent pre syncope) ?

## 2021-11-30 NOTE — Assessment & Plan Note (Signed)
Pt still c/o gas and bloating ?Has had extensive GI work up  ?

## 2021-11-30 NOTE — Assessment & Plan Note (Signed)
Normal on last check  ?Would like it re checked at last PE ?

## 2021-11-30 NOTE — Progress Notes (Signed)
? ?Subjective:  ? ? Patient ID: Morgan [name], female    DOB: 05-19-1946, 76 y.o.   MRN: [id number] ? ?HPI ?Pt presents for ER visit on 11/24/21 ? ?Wt Readings from Last 3 Encounters:  ?11/30/21 140 lb 6 oz (63.7 kg)  ?11/24/21 141 lb 15.6 oz (64.4 kg)  ?10/23/21 142 lb (64.4 kg)  ? ?24.10 kg/m? ? ? ?She presented to ER after near syncopal event  ?She was standing/baking when she felt like she could pass out suddenly  ?Her head just dropped  ?Was going to fall/went to catch herself  ?Lost control of her body - managed to make it to the sofa herself  ?No nausea  ?Felt very weak  ?Unsure if dizzy   ? ? ?Felt better but did not drive/ went out to dinner ?Thought more the next day and was worried she had a TIA and that she needed eval  ? ?Theis resolved  ?Family was concerned she could have had a stroke  ?Nl w/u in ER  ? ?Labs ?Results for orders placed or performed during the hospital encounter of 11/24/21  ?Protime-INR  ?Result Value Ref Range  ? Prothrombin Time 13.5 11.4 - 15.2 seconds  ? INR 1.0 0.8 - 1.2  ?APTT  ?Result Value Ref Range  ? aPTT 32 24 - 36 seconds  ?CBC  ?Result Value Ref Range  ? WBC 6.5 4.0 - 10.5 K/uL  ? RBC 4.20 3.87 - 5.11 MIL/uL  ? Hemoglobin 12.9 12.0 - 15.0 g/dL  ? HCT 39.8 36.0 - 46.0 %  ? MCV 94.8 80.0 - 100.0 fL  ? MCH 30.7 26.0 - 34.0 pg  ? MCHC 32.4 30.0 - 36.0 g/dL  ? RDW 13.4 11.5 - 15.5 %  ? Platelets 229 150 - 400 K/uL  ? nRBC 0.0 0.0 - 0.2 %  ?Differential  ?Result Value Ref Range  ? Neutrophils Relative % 62 %  ? Neutro Abs 4.0 1.7 - 7.7 K/uL  ? Lymphocytes Relative 24 %  ? Lymphs Abs 1.6 0.7 - 4.0 K/uL  ? Monocytes Relative 11 %  ? Monocytes Absolute 0.7 0.1 - 1.0 K/uL  ? Eosinophils Relative 2 %  ? Eosinophils Absolute 0.1 0.0 - 0.5 K/uL  ? Basophils Relative 1 %  ? Basophils Absolute 0.0 0.0 - 0.1 K/uL  ? Immature Granulocytes 0 %  ? Abs Immature Granulocytes 0.02 0.00 - 0.07 K/uL  ?Comprehensive metabolic panel  ?Result Value Ref Range  ? Sodium 139 135 - 145 mmol/L  ?  Potassium 4.2 3.5 - 5.1 mmol/L  ? Chloride 107 98 - 111 mmol/L  ? CO2 26 22 - 32 mmol/L  ? Glucose, Bld 103 (H) 70 - 99 mg/dL  ? BUN 18 8 - 23 mg/dL  ? Creatinine, Ser 0.81 0.44 - 1.00 mg/dL  ? Calcium 9.2 8.9 - 10.3 mg/dL  ? Total Protein 5.9 (L) 6.5 - 8.1 g/dL  ? Albumin 3.9 3.5 - 5.0 g/dL  ? AST 19 15 - 41 U/L  ? ALT 16 0 - 44 U/L  ? Alkaline Phosphatase 75 38 - 126 U/L  ? Total Bilirubin 1.0 0.3 - 1.2 mg/dL  ? GFR, Estimated >60 >60 mL/min  ? Anion gap 6 5 - 15  ?I-stat chem 8, ED  ?Result Value Ref Range  ? Sodium 141 135 - 145 mmol/L  ? Potassium 4.1 3.5 - 5.1 mmol/L  ? Chloride 103 98 - 111 mmol/L  ? BUN 21 8 - 23  mg/dL  ? Creatinine, Ser 0.70 0.44 - 1.00 mg/dL  ? Glucose, Bld 90 70 - 99 mg/dL  ? Calcium, Ion 1.24 1.15 - 1.40 mmol/L  ? TCO2 27 22 - 32 mmol/L  ? Hemoglobin 13.3 12.0 - 15.0 g/dL  ? HCT 39.0 36.0 - 46.0 %  ?  ?CT ANGIO HEAD NECK W WO CM ? ?Result Date: 11/24/2021 ?CLINICAL DATA:  Dizziness. EXAM: CT ANGIOGRAPHY HEAD AND NECK TECHNIQUE: Multidetector CT imaging of the head and neck was performed using the standard protocol during bolus administration of intravenous contrast. Multiplanar CT image reconstructions and MIPs were obtained to evaluate the vascular anatomy. Carotid stenosis measurements (when applicable) are obtained utilizing NASCET criteria, using the distal internal carotid diameter as the denominator. RADIATION DOSE REDUCTION: This exam was performed according to the departmental dose-optimization program which includes automated exposure control, adjustment of the mA and/or kV according to patient size and/or use of iterative reconstruction technique. CONTRAST:  124m OMNIPAQUE IOHEXOL 350 MG/ML SOLN COMPARISON:  MRI 02/22/2006 FINDINGS: CT HEAD FINDINGS Brain: No evidence of acute infarction, mass lesion, hemorrhage, hydrocephalus or subdural collection. Generalized brain volume loss. Mild chronic small-vessel ischemic change of the hemispheric white matter. Vascular: No abnormal  vascular finding. Skull: Negative Sinuses: Clear Orbits: Normal Review of the MIP images confirms the above findings CTA NECK FINDINGS Aortic arch: Aortic atherosclerosis.  Branching pattern is normal. Right carotid system: Common carotid artery widely patent to the bifurcation. Soft plaque at the bifurcation and ICA bulb but no stenosis. Cervical ICA widely patent. Left carotid system: Common carotid artery widely patent to the bifurcation. Carotid bifurcation is normal. No stenosis. Cervical ICA widely patent. Vertebral arteries: Both vertebral artery origins are widely patent. Both vertebral arteries appear normal through the cervical region to the foramen magnum. Skeleton: Ordinary cervical spondylosis, most advanced C5-6 and C6-7. Other neck: No mass or lymphadenopathy. Upper chest: Lung apices are clear. Review of the MIP images confirms the above findings CTA HEAD FINDINGS Anterior circulation: Both internal carotid arteries are widely patent through the skull base and siphon regions. Ordinary siphon atherosclerotic calcification but no stenosis. Anterior and middle cerebral vessels are patent. No proximal stenosis, aneurysm or vascular malformation. Posterior circulation: Both vertebral arteries are widely patent through the foramen magnum to the basilar. No basilar stenosis. Posterior circulation branch vessels are normal. Venous sinuses: Patent and normal. Anatomic variants: None significant. Review of the MIP images confirms the above findings IMPRESSION: No acute head CT finding. Mild generalized volume loss. Mild chronic small-vessel ischemic change of the white matter. Negative CT angiography. Aortic atherosclerosis. Mild soft plaque at the right carotid bifurcation. No carotid stenosis on either side. No posterior circulation pathology. Electronically Signed   By: MNelson ChimesM.D.   On: 11/24/2021 13:05   ? ?BP Readings from Last 3 Encounters:  ?11/30/21 128/68  ?11/24/21 (!) 146/98  ?10/23/21 130/80   ? ?Pulse Readings from Last 3 Encounters:  ?11/30/21 (!) 59  ?11/24/21 (!) 48  ?10/23/21 (!) 52  ? ?EKG sinus bradycardia with pulse of 51 ? ? ?Very busy  ?Taking care of her husband who has dementia  ? ?Still having GI problems ?Gas - bother her  ?She has had a big work up  ? ?Still loosing weight  ?Taste and smell are off/makes it very difficult  ? ?Patient Active Problem List  ? Diagnosis Date Noted  ? Pre-syncope 11/30/2021  ? Insomnia 11/30/2021  ? Bradycardia 11/30/2021  ? Fasting hypoglycemia 10/03/2021  ?  S/P laparoscopic cholecystectomy 08/22/2021  ? Elevated ferritin 06/25/2021  ? Gallstones 06/25/2021  ? Elevated LFTs 06/25/2021  ? Weight loss 06/25/2021  ? COVID-19 virus infection 02/20/2021  ? Current use of proton pump inhibitor 07/04/2020  ? Change in stool caliber 07/04/2020  ? Idiopathic small fiber sensory neuropathy 04/12/2020  ? Belching 02/09/2020  ? Vitamin D deficiency 10/05/2019  ? Neck pain 03/02/2019  ? Colon cancer screening 02/09/2019  ? Paresthesia of foot, bilateral 08/12/2016  ? Encounter for Medicare annual wellness exam 04/13/2014  ? Allergic rhinitis 04/13/2014  ? Routine general medical examination at a health care facility 04/07/2014  ? Multiple thyroid nodules 01/24/2014  ? History of small bowel obstruction 01/20/2014  ? DDD (degenerative disc disease), cervical   ? DDD (degenerative disc disease), lumbar   ? Spinal stenosis   ? TINNITUS 06/08/2010  ? Hypothyroidism 04/18/2010  ? BRADYCARDIA 05/31/2008  ? GERD 05/03/2008  ? DIVERTICULOSIS OF COLON 04/26/2008  ? HEPATITIS, HX OF 04/26/2008  ? COLONIC POLYPS, HYPERPLASTIC, HX OF 04/26/2008  ? HEMORRHOIDS, INTERNAL 01/11/2008  ? DERMATITIS, SEBORRHEIC NEC 01/28/2007  ? Hyperlipidemia 01/19/2007  ? IBS 01/19/2007  ? OVERACTIVE BLADDER 01/19/2007  ? POSTMENOPAUSAL STATUS 01/19/2007  ? ECZEMA 01/19/2007  ? Osteoarthritis 01/19/2007  ? ?Past Medical History:  ?Diagnosis Date  ? Allergy   ? Bradycardia   ? Collagen vascular disease  ([place])   ? Connective tissue disease ([place])   ? COVID-19   ? DDD (degenerative disc disease), cervical   ? DDD (degenerative disc disease), lumbar   ? Endometrial polyp   ? not resolved with D&C (following)  ? GERD

## 2021-12-03 ENCOUNTER — Telehealth: Payer: Self-pay | Admitting: Cardiology

## 2021-12-03 NOTE — Telephone Encounter (Signed)
Patient would like to switch from Dr. Harrell Gave to Dr. Harrington Challenger.  ?

## 2021-12-04 ENCOUNTER — Ambulatory Visit: Payer: PPO | Admitting: Dermatology

## 2021-12-04 DIAGNOSIS — L570 Actinic keratosis: Secondary | ICD-10-CM

## 2021-12-04 DIAGNOSIS — L720 Epidermal cyst: Secondary | ICD-10-CM

## 2021-12-04 DIAGNOSIS — L578 Other skin changes due to chronic exposure to nonionizing radiation: Secondary | ICD-10-CM | POA: Diagnosis not present

## 2021-12-04 DIAGNOSIS — L820 Inflamed seborrheic keratosis: Secondary | ICD-10-CM

## 2021-12-04 NOTE — Telephone Encounter (Signed)
OK 

## 2021-12-04 NOTE — Patient Instructions (Addendum)

## 2021-12-04 NOTE — Progress Notes (Signed)
? ?Follow-Up Visit ?  ?Subjective  ?Morgan [name] is a 76 y.o. female who presents for the following: Follow-up. ? ?Patient here for 3 month follow-up Aks of the chest, arms, and hands. She did 5FU/Calcipotriene cream treatment 3 months ago to the chest and had a good reaction. Patient had such a severe reaction that she didn't want to treat arms and hands. She has a growth on her left popliteal that gets irritated. She also has a few spots on the face to check today.  ? ?The following portions of the chart were reviewed this encounter and updated as appropriate:  ?  ?  ? ?Review of Systems:  No other skin or systemic complaints except as noted in HPI or Assessment and Plan. ? ?Objective  ?Well appearing patient in no apparent distress; mood and affect are within normal limits. ? ?A focused examination was performed including face, chest, arms, hands. Relevant physical exam findings are noted in the Assessment and Plan. ? ? ? ?Assessment & Plan  ?Actinic Damage ?- chronic, secondary to cumulative UV radiation exposure/sun exposure over time ?- diffuse scaly erythematous macules with underlying dyspigmentation ?- Recommend daily broad spectrum sunscreen SPF 30+ to sun-exposed areas, reapply every 2 hours as needed.  ?- Recommend staying in the shade or wearing long sleeves, sun glasses (UVA+UVB protection) and wide brim hats (4-inch brim around the entire circumference of the hat). ?- Call for new or changing lesions. ? ?AK (actinic keratosis) ?R wrist, arms, chest ? ?vs ISK R wrist ? ?Good result post 5FU/VitD chest, clear today ? ?Patient will spot treat area on the right wrist with 5FU/Calcipotriene Cream bid x 7 days. Patient defers treating entire arms and hands at this time due to severity of reaction on chest.  ? ?Actinic keratoses are precancerous spots that appear secondary to cumulative UV radiation exposure/sun exposure over time. They are chronic with expected duration over 1 year. A portion of actinic  keratoses will progress to squamous cell carcinoma of the skin. It is not possible to reliably predict which spots will progress to skin cancer and so treatment is recommended to prevent development of skin cancer. ? ?Recommend daily broad spectrum sunscreen SPF 30+ to sun-exposed areas, reapply every 2 hours as needed.  ?Recommend staying in the shade or wearing long sleeves, sun glasses (UVA+UVB protection) and wide brim hats (4-inch brim around the entire circumference of the hat). ?Call for new or changing lesions. ? ?5-fluorouracil/calcipotriene cream is is a type of field treatment used to treat precancers, thin skin cancers, and areas of sun damage. Expected reaction includes irritation and mild inflammation potentially progressing to more severe inflammation including redness, scaling, crusting and open sores/erosions.  If too much irritation occurs, ensure application of only a thin layer and decrease frequency of use to achieve a tolerable level of inflammation. Recommend applying Vaseline ointment to open sores as needed.  Minimize sun exposure while under treatment. Recommend daily broad spectrum sunscreen SPF 30+ to sun-exposed areas, reapply every 2 hours as needed.  ? ?   ? ?Inflamed seborrheic keratosis ?Left Popliteal ? ?Destruction of lesion - Left Popliteal ? ?Destruction method: cryotherapy   ?Informed consent: discussed and consent obtained   ?Lesion destroyed using liquid nitrogen: Yes   ?Region frozen until ice ball extended beyond lesion: Yes   ?Outcome: patient tolerated procedure well with no complications   ?Post-procedure details: wound care instructions given   ?Additional details:  Prior to procedure, discussed risks of blister formation,  small wound, skin dyspigmentation, or rare scar following cryotherapy. Recommend Vaseline ointment to treated areas while healing. ? ? ? ?Milia ?- tiny firm white papule of the right lower cheek ?- type of cyst ?- benign ?- may be extracted if  symptomatic ?- observe ?- sample of adapalene 0.1% gel given to patient, spot treat areas every night as tolerated. ? ?Return in about 6 months (around 06/06/2022) for Hx AKs. ? ?I, Morgan [name], CMA, am acting as scribe for Morgan Patty, MD . ?Documentation: I have reviewed the above documentation for accuracy and completeness, and I agree with the above. ? ?Morgan Patty MD  ? ? ?

## 2021-12-04 NOTE — Telephone Encounter (Signed)
Patient would like to cancel the switch from Dr. Harrell Gave to Dr. Harrington Challenger. ? ? ? ?

## 2021-12-05 ENCOUNTER — Ambulatory Visit: Payer: PPO | Admitting: Gastroenterology

## 2021-12-05 DIAGNOSIS — M5416 Radiculopathy, lumbar region: Secondary | ICD-10-CM | POA: Diagnosis not present

## 2021-12-07 ENCOUNTER — Telehealth: Payer: Self-pay | Admitting: Nurse Practitioner

## 2021-12-07 NOTE — Telephone Encounter (Signed)
Patient called seeking advise. Per patient, has had gas/bloating since Monday. Patient states it's so bad "I can't function." Patient is scheduled 12/26/21. Patient wants to know if she can be seen sooner? Please advise.

## 2021-12-07 NOTE — Telephone Encounter (Signed)
Returned pt call to inquire further about her symptoms. States she had 3 episodes this week that resulted in trapped gas in her colon 1 hour after eating that prevented her from performing her normal ADL's. States she had to lie in bed until the gas pain and "bubbles" passed. Upon further review of pt chart, appears she did not wish to take Xifaxan because she read articles stating that this medication causes Cdiff. In addition, appears Carl Best, CRNP had recommended pt adhere to a FODMAP diet to help reduce risk of worsening symptoms. Asked pt to provide me with a list of foods consumed during the 3 days she experienced this gas pain. States she ate fruit salad with a little OJ and also mentioned she consumed fish/shrimp as well as BBQ sandwich on another day. All these foods resulted in horrible gas pain. Education provided that it was likely that the high fructose syrup in the OJ and BBQ sauce as well as any seasonings such as garlic or onion may have triggered the gas pain. Pt expressed frustration stating, "well then I guess I just can't eat anything". Pt wanted to know if there was a medication that could be Rx'd to help her symptoms. Pt appt was rescheduled from 12/26/21 with Carl Best, CRNP to 12/18/21 with Ellouise Newer, PA to better address her request, symptoms and concerns.

## 2021-12-18 ENCOUNTER — Other Ambulatory Visit (INDEPENDENT_AMBULATORY_CARE_PROVIDER_SITE_OTHER): Payer: PPO

## 2021-12-18 ENCOUNTER — Ambulatory Visit (INDEPENDENT_AMBULATORY_CARE_PROVIDER_SITE_OTHER): Payer: PPO | Admitting: Physician Assistant

## 2021-12-18 ENCOUNTER — Encounter: Payer: Self-pay | Admitting: Physician Assistant

## 2021-12-18 VITALS — BP 116/82 | HR 58 | Ht 64.0 in | Wt 139.0 lb

## 2021-12-18 DIAGNOSIS — R142 Eructation: Secondary | ICD-10-CM

## 2021-12-18 DIAGNOSIS — R1013 Epigastric pain: Secondary | ICD-10-CM

## 2021-12-18 LAB — BASIC METABOLIC PANEL
BUN: 16 mg/dL (ref 6–23)
CO2: 30 mEq/L (ref 19–32)
Calcium: 9.5 mg/dL (ref 8.4–10.5)
Chloride: 104 mEq/L (ref 96–112)
Creatinine, Ser: 0.78 mg/dL (ref 0.40–1.20)
GFR: 74.33 mL/min (ref >60.00)
Glucose, Bld: 82 mg/dL (ref 70–99)
Potassium: 4.3 mEq/L (ref 3.5–5.1)
Sodium: 140 mEq/L (ref 135–145)

## 2021-12-18 NOTE — Patient Instructions (Addendum)
May use Hyosyamine 0.125 SL tablet every 4-6 hours as needed.  Your provider has requested that you go to the basement level for lab work before leaving today. Press "B" on the elevator. The lab is located at the first door on the left as you exit the elevator.  You have been scheduled for a CT Angiography of the abdomen and pelvis at [facility] ([address]---this is in the same building as [employer]).   You are scheduled on Friday 12/21/21 at 10:30 am. You should arrive 15 minutes prior to your appointment time for registration. Please follow the written instructions below on the day of your exam:  WARNING: IF YOU ARE ALLERGIC TO IODINE/X-RAY DYE, PLEASE NOTIFY RADIOLOGY IMMEDIATELY AT [phone]! YOU WILL BE GIVEN A 13 HOUR PREMEDICATION PREP.  1) Do not eat anything after 8:30 am (2 hours prior to your test). 2) Drink water.   If you have any questions regarding your exam or if you need to reschedule, you may call the CT department at [phone] between the hours of 8:00 am and 5:00 pm, Monday-Friday.

## 2021-12-18 NOTE — Progress Notes (Signed)
Chief Complaint: Severe abdominal pain and gas  HPI:    Morgan [name] is a 76 year old Caucasian female with a past medical history as listed below including collagen vascular disease, GERD and multiple others, noted to Dr. Silverio Decamp, who returns to clinic today for complaint of continued severe abdominal pain and gas.    04/13/2019 colonoscopy with left-sided diverticulosis and hemorrhoids and otherwise normal.    03/17/2020 EGD with LA grade B reflux esophagitis and a small hiatal hernia.    09/22/2020 CT abdomen pelvis unremarkable.    09/21/2021 office visit with Dr. Silverio Decamp for evaluation regarding IBS and suspected SIBO generalized abdominal cramping bloating and excessive gas.  She ended up taking Flagyl 500 3 times daily x10 days and her gas per rectum improved but she continued with burping up gas from above.    10/23/21 patient return to clinic and saw Carl Best, NP for continued eructations and epigastric pain.  At that time patient started on Famotidine 20 mg p.o. nightly and given a FODMAP diet handout.  Told to reduce probiotic to every other day.    Today, patient returns to clinic and continues with symptoms.  She tells me she has always had a lot of gas especially eructations since her 21s and has had trouble with trapped gas before even in her 72s where she would have to lean over someone's furniture in order to help maneuver it out.  Recently though she has had a change in symptoms.  Tells me that after taking the antibiotic for SIBO she is not having as much flatulence but continues with her eructations and a very painful debilitating episodes which have occurred 4 times recently, 1 on 5/12 then again 5/16 then 5/18 and 5/21 where she had such intense pain that she could not function and had to lay down and push and massage her abdomen for up to 3 hours at times until her pain seems to get some better.  It is a 10/10 on those occasions.  On 2 of these occasions this pain hit her  after her last bite of food from the meal and on the other 2 was in between eating, she thinks maybe an hour or 2.  Tells me this pain is so severe that she cannot function when it occurs.  This is very unlike her.    Also describes an episode of what sounds like a TIA with left-sided facial weakness and left arm droop, apparently seen in the ER following this and they could not find anything wrong.  She is going to follow-up with a cardiologist soon for further evaluation.    Denies fever, chills, blood in her stool or symptoms that awaken her from sleep.  Past Medical History:  Diagnosis Date   Allergy    Bradycardia    Collagen vascular disease ([place])    Connective tissue disease ([address])    COVID-19    DDD (degenerative disc disease), cervical    DDD (degenerative disc disease), lumbar    Endometrial polyp    not resolved with D&C (following)   GERD (gastroesophageal reflux disease)    gas/belching issues-was on protonix for short time-not since 6 months   HLD (hyperlipidemia)    Mixed connective tissue disease ([place]) diagnosed 5 years ago   no problems since diagnosis   Multiple thyroid nodules 01/24/2014   Obesity    Osteoarthritis of cervical spine    PONV (postoperative nausea and vomiting)    Renal calculi  Spinal stenosis     Past Surgical History:  Procedure Laterality Date   APPENDECTOMY     CHOLECYSTECTOMY N/A 08/22/2021   Procedure: LAPAROSCOPIC CHOLECYSTECTOMY;  Surgeon: Coralie Keens, MD;  Location: [facility];  Service: General;  Laterality: N/A;   COLONOSCOPY     DILATION AND CURETTAGE OF UTERUS     fibroids   DILATION AND CURETTAGE OF UTERUS  01/2005   endometrial polyp   ENDOMETRIAL BIOPSY  2008   fibroid   HAND SURGERY     carpal tunnel release bilateral Dr. Daylene Katayama   hysteroscopic resection      polyps of PMB   TONSILLECTOMY     TUBAL LIGATION     1973   UPPER GASTROINTESTINAL ENDOSCOPY      Current Outpatient Medications  Medication  Sig Dispense Refill   Cholecalciferol (VITAMIN D3) 25 MCG (1000 UT) CAPS Take 1 capsule by mouth daily in the afternoon.     clobetasol (OLUX) 0.05 % topical foam Apply topically 2 (two) times daily. 50 g 5   Cyanocobalamin (VITAMIN B12) 500 MCG TABS Take 1 tablet by mouth every other day.     famotidine (PEPCID) 20 MG tablet Take 1 tablet (20 mg total) by mouth at bedtime. 30 tablet 2   halobetasol (ULTRAVATE) 0.05 % cream APPLY TOPICALLY 2 TIMES DAILY AS NEEDED FOR UP TO 14 DAYS. AVOID FACE, GROIN ANDAXILLA 30 g 0   hydrocortisone valerate cream (WESTCORT) 0.2 % Apply topically.     Hyoscyamine Sulfate SL (LEVSIN/SL) 0.125 MG SUBL Place 1 tablet under the tongue daily as needed. 30 tablet 1   levothyroxine (SYNTHROID) 25 MCG tablet Take 25 mcg by mouth daily before breakfast.     polyethylene glycol powder (GLYCOLAX/MIRALAX) 17 GM/SCOOP powder Take 17 g by mouth as needed.     traMADol (ULTRAM) 50 MG tablet Take 1-2 tablets (50-100 mg total) by mouth every 6 (six) hours as needed. 20 tablet 0   triamcinolone cream (KENALOG) 0.1 % Apply topically.     valACYclovir (VALTREX) 1000 MG tablet TAKE 2 TABLET BY MOUTH AT FIRST ONSET OFSYMPTOMS OF COLD SORES, THEN TAKE 2 TABLETS BY MOUTH 12 HOURS LATER 30 tablet 10   No current facility-administered medications for this visit.    Allergies as of 12/18/2021 - Review Complete 12/18/2021  Allergen Reaction Noted   Codeine Nausea And Vomiting 01/19/2007   Hydrocodone-acetaminophen  01/19/2007   Penicillins Hives 01/19/2007    Family History  Problem Relation Age of Onset   Dementia Mother    Coronary artery disease Father    Heart attack Father        deceased at 47   Diabetes Maternal Grandmother    Goiter Maternal Grandmother    Cancer Grandchild        rare type (dying at age 39)   Diabetes Cousin        multiple   Colon cancer Neg Hx    Esophageal cancer Neg Hx    Stomach cancer Neg Hx    Rectal cancer Neg Hx    Colon polyps Neg Hx      Social History   Socioeconomic History   Marital status: Married    Spouse name: Not on file   Number of children: 4   Years of education: HS   Highest education level: Not on file  Occupational History   Occupation: Retired   Tobacco Use   Smoking status: Former    Types: Cigarettes  Quit date: 07/22/1965    Years since quitting: 56.4   Smokeless tobacco: Never   Tobacco comments:    Smoked for 2 years as a teen  Scientific laboratory technician Use: Never used  Substance and Sexual Activity   Alcohol use: No   Drug use: No   Sexual activity: Yes    Birth control/protection: Surgical    Comment: BTL  Other Topics Concern   Not on file  Social History Narrative   + caffeine use     Social Determinants of Health   Financial Resource Strain: Low Risk    Difficulty of Paying Living Expenses: Not hard at all  Food Insecurity: No Food Insecurity   Worried About Charity fundraiser in the Last Year: Never true   Ran Out of Food in the Last Year: Never true  Transportation Needs: No Transportation Needs   Lack of Transportation (Medical): No   Lack of Transportation (Non-Medical): No  Physical Activity: Inactive   Days of Exercise per Week: 0 days   Minutes of Exercise per Session: 0 min  Stress: No Stress Concern Present   Feeling of Stress : Only a little  Social Connections: Moderately Integrated   Frequency of Communication with Friends and Family: More than three times a week   Frequency of Social Gatherings with Friends and Family: More than three times a week   Attends Religious Services: 1 to 4 times per year   Active Member of [employer] or Organizations: No   Attends Music therapist: Never   Marital Status: Married  Human resources officer Violence: Not At Risk   Fear of Current or Ex-Partner: No   Emotionally Abused: No   Physically Abused: No   Sexually Abused: No    Review of Systems:    Constitutional: No weight loss, fever or chills Cardiovascular: No  chest pain Respiratory: No SOB  Gastrointestinal: See HPI and otherwise negative   Physical Exam:  Vital signs: BP 116/82   Pulse (!) 58   Ht '5\' 4"'$  (1.626 m)   Wt 139 lb (63 kg)   LMP 07/22/1989 (Approximate)   BMI 23.86 kg/m   Constitutional:   Pleasant Elderly Caucasian female appears to be in NAD, Well developed, Well nourished, alert and cooperative Respiratory: Respirations even and unlabored. Lungs clear to auscultation bilaterally.   No wheezes, crackles, or rhonchi.  Cardiovascular: Normal S1, S2. No MRG. Regular rate and rhythm. No peripheral edema, cyanosis or pallor.  Gastrointestinal:  Soft, nondistended, nontender. No rebound or guarding. Normal bowel sounds. No appreciable masses or hepatomegaly. Rectal:  Not performed.  Psychiatric: Oriented to person, place and time. Demonstrates good judgement and reason without abnormal affect or behaviors.  RELEVANT LABS AND IMAGING: CBC    Component Value Date/Time   WBC 6.5 11/24/2021 1154   RBC 4.20 11/24/2021 1154   HGB 13.3 11/24/2021 1232   HGB 12.9 09/18/2021 1519   HCT 39.0 11/24/2021 1232   PLT 229 11/24/2021 1154   PLT 185 09/18/2021 1519   MCV 94.8 11/24/2021 1154   MCH 30.7 11/24/2021 1154   MCHC 32.4 11/24/2021 1154   RDW 13.4 11/24/2021 1154   LYMPHSABS 1.6 11/24/2021 1154   MONOABS 0.7 11/24/2021 1154   EOSABS 0.1 11/24/2021 1154   BASOSABS 0.0 11/24/2021 1154    CMP     Component Value Date/Time   NA 141 11/24/2021 1232   NA 141 03/13/2020 1418   K 4.1 11/24/2021 1232   CL  103 11/24/2021 1232   CO2 26 11/24/2021 1154   GLUCOSE 90 11/24/2021 1232   BUN 21 11/24/2021 1232   BUN 16 03/13/2020 1418   CREATININE 0.70 11/24/2021 1232   CREATININE 0.70 09/18/2021 1519   CREATININE 0.76 12/29/2018 1526   CALCIUM 9.2 11/24/2021 1154   PROT 5.9 (L) 11/24/2021 1154   PROT 6.4 11/10/2019 1649   ALBUMIN 3.9 11/24/2021 1154   AST 19 11/24/2021 1154   AST 55 (H) 09/18/2021 1519   ALT 16 11/24/2021 1154    ALT 25 09/18/2021 1519   ALKPHOS 75 11/24/2021 1154   BILITOT 1.0 11/24/2021 1154   BILITOT 0.6 09/18/2021 1519   GFRNONAA >60 11/24/2021 1154   GFRNONAA >60 09/18/2021 1519   GFRNONAA 78 12/29/2018 1526   GFRAA 96 03/13/2020 1418   GFRAA 91 12/29/2018 1526    Assessment: 1.  Epigastric pain: Severe intense pain, has had 4 episodes of this recently, more severe than any discomfort she has ever had from gas in the past, also recent description of what sounds like a TIA and history of vascular collagen disorder; consider possible blockages in blood flow causing intense pain after eating or shortly after eating versus IBS versus other 2.  Eructations: Since in her 28s, no change after treatment for SIBO, seems to get trapped at times causing above  Plan: 1.  Scheduled patient for a CT angiography of the abdomen and pelvis for further evaluation of intense pain which is her after eating. 2.  Prescribed Hyoscyamine sulfate 0.125 mg sublingual tabs, 1-2 tabs every 4-6 hours as needed for pain.  Prescribed #10, patient is going to try these when she has her next pain episode to see if it helps relax her bowel at all. 3.  Patient following clinic per recommendations after above.  Ellouise Newer, PA-C [facility] 12/18/2021, 1:22 PM  Cc: Tower, Wynelle Fanny, MD

## 2021-12-21 ENCOUNTER — Telehealth: Payer: Self-pay

## 2021-12-21 ENCOUNTER — Ambulatory Visit (INDEPENDENT_AMBULATORY_CARE_PROVIDER_SITE_OTHER)
Admission: RE | Admit: 2021-12-21 | Discharge: 2021-12-21 | Disposition: A | Discharge status: Home / Self Care | Payer: PPO | Source: Ambulatory Visit | Attending: Physician Assistant | Admitting: Physician Assistant

## 2021-12-21 DIAGNOSIS — K8050 Calculus of bile duct without cholangitis or cholecystitis without obstruction: Secondary | ICD-10-CM

## 2021-12-21 DIAGNOSIS — R142 Eructation: Secondary | ICD-10-CM

## 2021-12-21 DIAGNOSIS — R1013 Epigastric pain: Secondary | ICD-10-CM

## 2021-12-21 MED ORDER — IOHEXOL 350 MG/ML SOLN
100.0000 mL | Freq: Once | INTRAVENOUS | Status: AC | PRN
  Administered 2021-12-21: 100 mL via INTRAVENOUS

## 2021-12-21 NOTE — Telephone Encounter (Signed)
-----   Message from [place], [place] sent at 12/21/2021  1:10 PM EDT ----- Regarding: MRCP Please let patient know that CTA did not show any blood flow abnormalities, but he did question some stones in her common bile duct which could be giving her some symptoms.  Unfortunately this means we need to repeat imaging with an MRI/MRCP to better let us see this area and define what is going on.  Along with this I would like her to come back in for labs, a CBC and CMP.  Looks like she had not had any blood work done for the past month.  Please order this MRI to be done early next week.  Please let the patient know if that she has worsening symptoms over the weekend or starts with nausea or vomiting then she needs to proceed to the ER.  Thanks, Servando Salina Dr. Silverio Decamp. ----- Message ----- From: Interface, Rad Results In Sent: 12/21/2021  11:55 AM EDT To: Levin Erp, PA

## 2021-12-21 NOTE — Telephone Encounter (Signed)
Lm on home vm for patient to return call. Lab orders in epic. MRCP order in epic. Secure staff message sent to radiology scheduling to contact patient to set up her appt.

## 2021-12-24 NOTE — Telephone Encounter (Signed)
Lm on home vm asking that pt give me a call back. MRCP appt has been scheduled for Monday, 01/07/22 at 4 pm.

## 2021-12-26 ENCOUNTER — Ambulatory Visit: Payer: PPO | Admitting: Nurse Practitioner

## 2021-12-26 NOTE — Telephone Encounter (Signed)
Called and spoke with patient regarding CTA results and recommendations. Pt states that she is out of town at this time and will return early next week. Pt will come by for lab when she returns. Pt has been advised that we will be in touch once her MRI results return. Pt verbalized understanding and had no concerns at the end of the call.

## 2021-12-28 ENCOUNTER — Telehealth: Payer: Self-pay | Admitting: Physician Assistant

## 2021-12-28 NOTE — Telephone Encounter (Signed)
Patient called stating she was having blood work done on Tuesday.  She asked if she could also have her ferritin and protein levels added to the order.  She said her ferritin levels have been running high and her protein levels have been running low and while she was there she wanted to go ahead and  have them done, if that's possible.  Please call patient and advise.  Thank you.

## 2021-12-31 NOTE — Telephone Encounter (Signed)
Attempted to reach patient. Her phone goes straight to vm. I left vm for patient to return call.

## 2021-12-31 NOTE — Telephone Encounter (Signed)
Pt returned call. We have reviewed Morgan [name]'s recommendations. Pt verbalized understanding and had no concerns at the end of the call.

## 2021-12-31 NOTE — Progress Notes (Signed)
Reviewed and agree with documentation and assessment and plan. K. Veena Pepper Kerrick , MD   

## 2022-01-01 ENCOUNTER — Other Ambulatory Visit (INDEPENDENT_AMBULATORY_CARE_PROVIDER_SITE_OTHER): Payer: PPO

## 2022-01-01 DIAGNOSIS — K8050 Calculus of bile duct without cholangitis or cholecystitis without obstruction: Secondary | ICD-10-CM

## 2022-01-01 DIAGNOSIS — R1013 Epigastric pain: Secondary | ICD-10-CM | POA: Diagnosis not present

## 2022-01-01 LAB — COMPREHENSIVE METABOLIC PANEL
ALT: 16 U/L (ref 0–35)
AST: 20 U/L (ref 0–37)
Albumin: 4.2 g/dL (ref 3.5–5.2)
Alkaline Phosphatase: 65 U/L (ref 39–117)
BUN: 16 mg/dL (ref 6–23)
CO2: 29 mEq/L (ref 19–32)
Calcium: 9.4 mg/dL (ref 8.4–10.5)
Chloride: 104 mEq/L (ref 96–112)
Creatinine, Ser: 0.74 mg/dL (ref 0.40–1.20)
GFR: 79.16 mL/min (ref >60.00)
Glucose, Bld: 78 mg/dL (ref 70–99)
Potassium: 3.9 mEq/L (ref 3.5–5.1)
Sodium: 140 mEq/L (ref 135–145)
Total Bilirubin: 0.6 mg/dL (ref 0.2–1.2)
Total Protein: 6.1 g/dL (ref 6.0–8.3)

## 2022-01-01 LAB — CBC
HCT: 38.3 % (ref 36.0–46.0)
Hemoglobin: 12.8 g/dL (ref 12.0–15.0)
MCHC: 33.4 g/dL (ref 30.0–36.0)
MCV: 92.7 fl (ref 78.0–100.0)
Platelets: 197 10*3/uL (ref 150.0–400.0)
RBC: 4.13 Mil/uL (ref 3.87–5.11)
RDW: 14 % (ref 11.5–15.5)
WBC: 5.6 10*3/uL (ref 4.0–10.5)

## 2022-01-07 ENCOUNTER — Other Ambulatory Visit: Payer: Self-pay | Admitting: Physician Assistant

## 2022-01-07 ENCOUNTER — Ambulatory Visit
Admission: RE | Admit: 2022-01-07 | Discharge: 2022-01-07 | Disposition: A | Discharge status: Home / Self Care | Payer: PPO | Source: Ambulatory Visit | Attending: Physician Assistant | Admitting: Physician Assistant

## 2022-01-07 DIAGNOSIS — R1013 Epigastric pain: Secondary | ICD-10-CM

## 2022-01-07 DIAGNOSIS — K8050 Calculus of bile duct without cholangitis or cholecystitis without obstruction: Secondary | ICD-10-CM | POA: Insufficient documentation

## 2022-01-07 DIAGNOSIS — R935 Abnormal findings on diagnostic imaging of other abdominal regions, including retroperitoneum: Secondary | ICD-10-CM | POA: Diagnosis not present

## 2022-01-07 DIAGNOSIS — K838 Other specified diseases of biliary tract: Secondary | ICD-10-CM | POA: Diagnosis not present

## 2022-01-07 MED ORDER — GADOBUTROL 1 MMOL/ML IV SOLN
6.0000 mL | Freq: Once | INTRAVENOUS | Status: AC | PRN
  Administered 2022-01-07: 6 mL via INTRAVENOUS

## 2022-01-10 ENCOUNTER — Telehealth: Payer: Self-pay

## 2022-01-10 ENCOUNTER — Other Ambulatory Visit: Payer: Self-pay

## 2022-01-10 DIAGNOSIS — K8050 Calculus of bile duct without cholangitis or cholecystitis without obstruction: Secondary | ICD-10-CM

## 2022-01-10 NOTE — Telephone Encounter (Signed)
Called and spoke with patient. I offered her the 01/21/22 appt spot at [facility] with Dr. Carlean Purl. Pt states that she will be in town this day and would like this appt. Pt is aware that she would need to arrive at [facility] by 7 am with a care partner. Pt would like her instructions mailed to her, she confirmed address on file. I answered all of patient's questions to the best of my ability. Pt verbalized understanding and had no concerns at the end of the call.   Ambulatory referral to GI in epic. EGD instructions mailed to patient today. Pt is not on any anticoagulation and is not a diabetic.

## 2022-01-10 NOTE — Telephone Encounter (Signed)
-----   Message from Gatha Mayer, MD sent at 01/10/2022 10:20 AM EDT ----- I am the yellow doctor the week of July 3.  I have a 730 case on that Monday and if they will let me follow with an ERCP I will or I will do it later that week if they will added on at [facility].  I do not want to have to go over to [place] long and do it however. ----- Message ----- From: Mauri Pole, MD Sent: 01/10/2022   9:31 AM EDT To: Milus Banister, MD; Ladene Artist, MD; #  [facility], can you please help book the case as August is the earliest availability, may be can try to move her up in case of any cancellation. Please advise patient to call with any change of symptoms, fever or chills. Check CBC and CMP every 2 weeks Thank you ----- Message ----- From: Irving Copas., MD Sent: 01/09/2022   1:39 PM EDT To: Milus Banister, MD; Ladene Artist, MD; #  Reviewed the images as well. Certainly needs some biliary clearance.  I am happy to be available for this patient unfortunately I do not have availability currently will be away until July as I will be leaving town this week.  I am also already fully booked on my outpatient days through August.  However, if her liver tests are normal and she is stable she can be placed on my hospital week on August 2/3/4 (7:30 AM Case) with close monitoring.  In the interim, her liver tests should be rechecked as well as a blood count and if she is doing poorly or having rising her liver tests someone else will need to get her on the books. Thanks. GM  ----- Message ----- From: Mauri Pole, MD Sent: 01/09/2022   1:19 PM EDT To: Milus Banister, MD; Ladene Artist, MD; #  We will have to request one of the ERCP doctors to do the case based on whoever has the availability. Hopefully we can get her in soon. I am CCing them. Thanks  ----- Message ----- From: Levin Erp, PA Sent: 01/09/2022   8:43 AM EDT To: Mauri Pole, MD; Yevette Edwards,  RN  Looks like this patient needs ERCP.  How should I go about scheduling this?  Thanks-JLL  Linnell Swords-can you call patient and see how she is feeling?  Thanks-JLL

## 2022-01-11 ENCOUNTER — Encounter (HOSPITAL_COMMUNITY): Payer: Self-pay | Admitting: Internal Medicine

## 2022-01-16 ENCOUNTER — Telehealth: Payer: Self-pay | Admitting: Physician Assistant

## 2022-01-16 ENCOUNTER — Encounter: Payer: Self-pay | Admitting: Nurse Practitioner

## 2022-01-16 NOTE — Telephone Encounter (Signed)
Returned call to patient. She states that she received her instructions in the mail today and was confused about the section that said "since you have completed a bowel prep". I told the patient to disregard as that is standard information that is attached to all instructions. I informed pt that she should focus on the instructions on the first page and having a care partner. Pt asked if her friend could drop her off at 7 am and then her care partner arrive at 8 am. I told pt that I would check with the hospital. The pt waited on the line while I called [facility] endo. I spoke with Lattie Haw in [place] and I was informed that this will be fine, they do prefer that the care partner stays at the facility, but if they decide to leave they will need to be able to get to the hospital in 20 minutes. I informed the pt of this information. She verbalized understanding and had no concerns at the end of the call.

## 2022-01-21 ENCOUNTER — Ambulatory Visit (HOSPITAL_BASED_OUTPATIENT_CLINIC_OR_DEPARTMENT_OTHER): Payer: PPO | Admitting: Anesthesiology

## 2022-01-21 ENCOUNTER — Ambulatory Visit (HOSPITAL_COMMUNITY): Payer: PPO | Admitting: Anesthesiology

## 2022-01-21 ENCOUNTER — Encounter (HOSPITAL_COMMUNITY): Payer: Self-pay | Admitting: Internal Medicine

## 2022-01-21 ENCOUNTER — Ambulatory Visit (HOSPITAL_COMMUNITY): Payer: PPO

## 2022-01-21 ENCOUNTER — Other Ambulatory Visit: Payer: Self-pay

## 2022-01-21 ENCOUNTER — Ambulatory Visit (HOSPITAL_COMMUNITY)
Admission: RE | Admit: 2022-01-21 | Discharge: 2022-01-21 | Disposition: A | Discharge status: Home / Self Care | Payer: PPO | Source: Ambulatory Visit | Attending: Internal Medicine | Admitting: Internal Medicine

## 2022-01-21 ENCOUNTER — Encounter (HOSPITAL_COMMUNITY)
Admission: RE | Disposition: A | Discharge status: Home / Self Care | Payer: Self-pay | Source: Ambulatory Visit | Attending: Internal Medicine

## 2022-01-21 DIAGNOSIS — K838 Other specified diseases of biliary tract: Secondary | ICD-10-CM

## 2022-01-21 DIAGNOSIS — K219 Gastro-esophageal reflux disease without esophagitis: Secondary | ICD-10-CM | POA: Insufficient documentation

## 2022-01-21 DIAGNOSIS — K8050 Calculus of bile duct without cholangitis or cholecystitis without obstruction: Secondary | ICD-10-CM

## 2022-01-21 DIAGNOSIS — E039 Hypothyroidism, unspecified: Secondary | ICD-10-CM | POA: Diagnosis not present

## 2022-01-21 DIAGNOSIS — Z9049 Acquired absence of other specified parts of digestive tract: Secondary | ICD-10-CM | POA: Insufficient documentation

## 2022-01-21 DIAGNOSIS — Z87891 Personal history of nicotine dependence: Secondary | ICD-10-CM | POA: Insufficient documentation

## 2022-01-21 DIAGNOSIS — K8020 Calculus of gallbladder without cholecystitis without obstruction: Secondary | ICD-10-CM | POA: Diagnosis not present

## 2022-01-21 HISTORY — PX: SPHINCTEROTOMY: SHX5544

## 2022-01-21 HISTORY — PX: REMOVAL OF STONES: SHX5545

## 2022-01-21 HISTORY — PX: ENDOSCOPIC RETROGRADE CHOLANGIOPANCREATOGRAPHY (ERCP) WITH PROPOFOL: SHX5810

## 2022-01-21 SURGERY — ENDOSCOPIC RETROGRADE CHOLANGIOPANCREATOGRAPHY (ERCP) WITH PROPOFOL
Anesthesia: General

## 2022-01-21 MED ORDER — CIPROFLOXACIN IN D5W 400 MG/200ML IV SOLN
INTRAVENOUS | Status: AC
  Filled 2022-01-21: qty 200

## 2022-01-21 MED ORDER — LIDOCAINE 2% (20 MG/ML) 5 ML SYRINGE
INTRAMUSCULAR | Status: DC | PRN
  Administered 2022-01-21: 60 mg via INTRAVENOUS

## 2022-01-21 MED ORDER — DICLOFENAC SUPPOSITORY 100 MG
RECTAL | Status: DC | PRN
  Administered 2022-01-21: 100 mg via RECTAL

## 2022-01-21 MED ORDER — FENTANYL CITRATE (PF) 100 MCG/2ML IJ SOLN: 25.0000 ug | INTRAMUSCULAR | Status: DC | PRN

## 2022-01-21 MED ORDER — PROPOFOL 10 MG/ML IV BOLUS
INTRAVENOUS | Status: DC | PRN
  Administered 2022-01-21: 120 mg via INTRAVENOUS

## 2022-01-21 MED ORDER — FENTANYL CITRATE (PF) 100 MCG/2ML IJ SOLN
INTRAMUSCULAR | Status: DC | PRN
  Administered 2022-01-21 (×2): 50 ug via INTRAVENOUS

## 2022-01-21 MED ORDER — DICLOFENAC SUPPOSITORY 100 MG
RECTAL | Status: AC
  Filled 2022-01-21: qty 1

## 2022-01-21 MED ORDER — AMISULPRIDE (ANTIEMETIC) 5 MG/2ML IV SOLN
10.0000 mg | Freq: Once | INTRAVENOUS | Status: DC | PRN
Start: 2022-01-21 — End: 2022-01-21

## 2022-01-21 MED ORDER — SODIUM CHLORIDE 0.9 % IV SOLN
INTRAVENOUS | Status: DC | PRN
  Administered 2022-01-21: 25 mL

## 2022-01-21 MED ORDER — ROCURONIUM BROMIDE 10 MG/ML (PF) SYRINGE
PREFILLED_SYRINGE | INTRAVENOUS | Status: DC | PRN
  Administered 2022-01-21: 40 mg via INTRAVENOUS

## 2022-01-21 MED ORDER — LACTATED RINGERS IV SOLN: INTRAVENOUS | Status: DC | PRN

## 2022-01-21 MED ORDER — DICLOFENAC SUPPOSITORY 100 MG: 100.0000 mg | Freq: Once | RECTAL | Status: DC

## 2022-01-21 MED ORDER — ONDANSETRON HCL 4 MG/2ML IJ SOLN
INTRAMUSCULAR | Status: DC | PRN
  Administered 2022-01-21: 4 mg via INTRAVENOUS

## 2022-01-21 MED ORDER — EPHEDRINE SULFATE-NACL 50-0.9 MG/10ML-% IV SOSY
PREFILLED_SYRINGE | INTRAVENOUS | Status: DC | PRN
  Administered 2022-01-21: 5 mg via INTRAVENOUS

## 2022-01-21 MED ORDER — DEXAMETHASONE SODIUM PHOSPHATE 10 MG/ML IJ SOLN
INTRAMUSCULAR | Status: DC | PRN
  Administered 2022-01-21: 5 mg via INTRAVENOUS

## 2022-01-21 MED ORDER — SODIUM CHLORIDE 0.9 % IV SOLN: INTRAVENOUS | Status: DC

## 2022-01-21 MED ORDER — CIPROFLOXACIN IN D5W 400 MG/200ML IV SOLN
400.0000 mg | Freq: Once | INTRAVENOUS | Status: AC
  Administered 2022-01-21: 400 mg via INTRAVENOUS

## 2022-01-21 MED ORDER — SUGAMMADEX SODIUM 200 MG/2ML IV SOLN
INTRAVENOUS | Status: DC | PRN
  Administered 2022-01-21 (×2): 100 mg via INTRAVENOUS

## 2022-01-21 MED ORDER — GLUCAGON HCL RDNA (DIAGNOSTIC) 1 MG IJ SOLR
INTRAMUSCULAR | Status: AC
  Filled 2022-01-21: qty 1

## 2022-01-21 NOTE — Discharge Instructions (Addendum)
Procedure was successful - 2 stones removed.  YOU HAD AN ENDOSCOPIC PROCEDURE TODAY: Refer to the procedure report and other information in the discharge instructions given to you for any specific questions about what was found during the examination. If this information does not answer your questions, please call Dr. Celesta Aver office at [phone] to clarify.   YOU SHOULD EXPECT: Some feelings of bloating in the abdomen. Passage of more gas than usual. Walking can help get rid of the air that was put into your GI tract during the procedure and reduce the bloating. If you had a lower endoscopy (such as a colonoscopy or flexible sigmoidoscopy) you may notice spotting of blood in your stool or on the toilet paper. Some abdominal soreness may be present for a day or two, also.  DIET:  Clear liquids until 1130 today then try soups. If ok in the AM solid food - low fat diet and advance as tolerated to your normal diet.  ACTIVITY: Your care partner should take you ho e directly after the procedure. You should plan to take it easy, moving slowly for the rest of the day. You can resume normal activity the day after the procedure however YOU SHOULD NOT DRIVE, use power tools, machinery or perform tasks that involve climbing or major physical exertion for 24 hours (because of the sedation medicines used during the test).   SYMPTOMS TO REPORT IMMEDIATELY: A gastroenterologist can be reached at any hour. Please call [phone]  for any of the following symptoms:  Following lower endoscopy (colonoscopy, flexible sigmoidoscopy) Excessive amounts of blood in the stool  Significant tenderness, worsening of abdominal pains  Swelling of the abdomen that is new, acute  Fever of 100 or higher  Following upper endoscopy (EGD, EUS, ERCP, esophageal dilation) Vomiting of blood or coffee ground material  New, significant abdominal pain  New, significant chest pain or pain under the shoulder blades  Painful or  persistently difficult swallowing  New shortness of breath  Black, tarry-looking or red, bloody stools  FOLLOW UP:  If any biopsies were taken you will be contacted by phone or by letter within the next 1-3 weeks. Call [phone]  if you have not heard about the biopsies in 3 weeks.  Please also call with any specific questions about appointments or follow up tests.

## 2022-01-21 NOTE — Transfer of Care (Signed)
Immediate Anesthesia Transfer of Care Note  Patient: Morgan [name]  Procedure(s) Performed: ENDOSCOPIC RETROGRADE CHOLANGIOPANCREATOGRAPHY (ERCP) WITH PROPOFOL SPHINCTEROTOMY REMOVAL OF STONES  Patient Location: PACU  Anesthesia Type:General  Level of Consciousness: drowsy and patient cooperative  Airway & Oxygen Therapy: Patient Spontanous Breathing and Patient connected to nasal cannula oxygen  Post-op Assessment: Report given to RN and Post -op Vital signs reviewed and stable  Post vital signs: Reviewed and stable  Last Vitals:  Vitals Value Taken Time  BP 163/82 01/21/22 0931  Temp    Pulse 76 01/21/22 0934  Resp 13 01/21/22 0934  SpO2 100 % 01/21/22 0934  Vitals shown include unvalidated device data.  Last Pain:  Vitals:   01/21/22 0721  TempSrc: Oral  PainSc: 0-No pain         Complications: No notable events documented.

## 2022-01-21 NOTE — Anesthesia Postprocedure Evaluation (Signed)
Anesthesia Post Note  Patient: Morgan [name]  Procedure(s) Performed: ENDOSCOPIC RETROGRADE CHOLANGIOPANCREATOGRAPHY (ERCP) WITH PROPOFOL SPHINCTEROTOMY REMOVAL OF STONES     Patient location during evaluation: PACU Anesthesia Type: General Level of consciousness: awake and alert Pain management: pain level controlled Vital Signs Assessment: post-procedure vital signs reviewed and stable Respiratory status: spontaneous breathing, nonlabored ventilation, respiratory function stable and patient connected to nasal cannula oxygen Cardiovascular status: blood pressure returned to baseline and stable Postop Assessment: no apparent nausea or vomiting Anesthetic complications: no   No notable events documented.  Last Vitals:  Vitals:   01/21/22 0945 01/21/22 1000  BP: (!) 148/79 140/78  Pulse: 65 60  Resp: 16 14  Temp:  36.6 C  SpO2: 98% 98%    Last Pain:  Vitals:   01/21/22 1000  TempSrc:   PainSc: 0-No pain                 Tiajuana Amass

## 2022-01-21 NOTE — Anesthesia Preprocedure Evaluation (Signed)
Anesthesia Evaluation  Patient identified by MRN, date of birth, ID band Patient awake    Reviewed: Allergy & Precautions, NPO status , Patient's Chart, lab work & pertinent test results  Airway Mallampati: II  TM Distance: >3 FB Neck ROM: Full    Dental   Pulmonary former smoker,    breath sounds clear to auscultation       Cardiovascular negative cardio ROS   Rhythm:Regular Rate:Normal     Neuro/Psych negative neurological ROS     GI/Hepatic Neg liver ROS, GERD  ,  Endo/Other  Hypothyroidism   Renal/GU negative Renal ROS     Musculoskeletal  (+) Arthritis ,   Abdominal   Peds  Hematology negative hematology ROS (+)   Anesthesia Other Findings   Reproductive/Obstetrics                             Anesthesia Physical Anesthesia Plan  ASA: 2  Anesthesia Plan: General   Post-op Pain Management: Minimal or no pain anticipated   Induction: Intravenous  PONV Risk Score and Plan: 3 and Dexamethasone, Ondansetron and Treatment may vary due to age or medical condition  Airway Management Planned: Oral ETT  Additional Equipment: None  Intra-op Plan:   Post-operative Plan: Extubation in OR  Informed Consent: I have reviewed the patients History and Physical, chart, labs and discussed the procedure including the risks, benefits and alternatives for the proposed anesthesia with the patient or authorized representative who has indicated his/her understanding and acceptance.     Dental advisory given  Plan Discussed with: CRNA  Anesthesia Plan Comments:         Anesthesia Quick Evaluation

## 2022-01-21 NOTE — Anesthesia Procedure Notes (Signed)
Procedure Name: Intubation Date/Time: 01/21/2022 8:46 AM  Performed by: Janene Harvey, CRNAPre-anesthesia Checklist: Patient identified, Emergency Drugs available, Suction available and Patient being monitored Patient Re-evaluated:Patient Re-evaluated prior to induction Oxygen Delivery Method: Circle system utilized Preoxygenation: Pre-oxygenation with 100% oxygen Induction Type: IV induction Ventilation: Mask ventilation without difficulty Laryngoscope Size: Mac and 4 Grade View: Grade I Tube type: Oral Tube size: 7.0 mm Number of attempts: 1 Airway Equipment and Method: Stylet and Oral airway Placement Confirmation: ETT inserted through vocal cords under direct vision, positive ETCO2 and breath sounds checked- equal and bilateral Secured at: 21 cm Tube secured with: Tape Dental Injury: Teeth and Oropharynx as per pre-operative assessment

## 2022-01-21 NOTE — Op Note (Signed)
[facility] Patient Name: Morgan [name] Procedure Date : 01/21/2022 MRN: [id number] Attending MD: Gatha Mayer , MD Date of Birth: November 13, 1945 CSN: [id number] Age: 76 Admit Type: Outpatient Procedure:                ERCP Indications:              Bile duct stone(s), For therapy of bile duct                            stone(s) Providers:                Gatha Mayer, MD, Benay Pillow, RN, Cletis Athens, Technician Referring MD:              Medicines:                General Anesthesia, Cipro 400 mg IV, diclofenac 100                            mg per rectum Complications:            No immediate complications. Estimated Blood Loss:     Estimated blood loss: none. Procedure:                Pre-Anesthesia Assessment:                           - Prior to the procedure, a History and Physical                            was performed, and patient medications and                            allergies were reviewed. The patient's tolerance of                            previous anesthesia was also reviewed. The risks                            and benefits of the procedure and the sedation                            options and risks were discussed with the patient.                            All questions were answered, and informed consent                            was obtained. Prior Anticoagulants: The patient has                            taken no previous anticoagulant or antiplatelet                            agents.  ASA Grade Assessment: II - A patient with                            mild systemic disease. After reviewing the risks                            and benefits, the patient was deemed in                            satisfactory condition to undergo the procedure.                           After obtaining informed consent, the scope was                            passed under direct vision. Throughout the                             procedure, the patient's blood pressure, pulse, and                            oxygen saturations were monitored continuously. The                            [employer] single use                            duodenoscope was introduced through the mouth, and                            used to inject contrast into and used to cannulate                            the bile duct. The ERCP was accomplished without                            difficulty. The patient tolerated the procedure                            well. Scope In: Scope Out: Findings:      A scout film of the abdomen was obtained. Surgical clips, consistent       with a previous cholecystectomy, were seen in the area of the right       upper quadrant of the abdomen. The esophagus was successfully intubated       under direct vision. The scope was advanced to a normal major papilla in       the descending duodenum without detailed examination of the pharynx,       larynx and associated structures, and upper GI tract. The upper GI tract       was grossly normal. Esophagus not seen well. Stomach and duodenum normal       with duodenoscope views. Major papilla normal in appearance and       cannulated with sphincterotme and then wire passed into CBD. Contrast  injected - partially filled - sphincterotomy performed - balloon passed       and inflated + contrast injected showing 2 stones as per MRCP. One       balloon sweep and both dark pigmented stones out with negative occlusion       cholangiogram. Biliary tree dilated moderately throughout. Pancreas not       entered/injected intentionally. Impression:               - Choledocholithiasis was found. Complete removal                            was accomplished by biliary sphincterotomy and                            balloon extraction.                           - The entire biliary tree was moderately dilated. Recommendation:           - Clear liquids  until 1130 then soups ok today.                            Advance diet more tomorrow (low fat to start)                           F/U GI Dr. Silverio Decamp prn Procedure Code(s):        --- Professional ---                           [phone], Endoscopic retrograde                            cholangiopancreatography (ERCP); with removal of                            calculi/debris from biliary/pancreatic duct(s)                           43262, Endoscopic retrograde                            cholangiopancreatography (ERCP); with                            sphincterotomy/papillotomy Diagnosis Code(s):        --- Professional ---                           K80.50, Calculus of bile duct without cholangitis                            or cholecystitis without obstruction                           K83.8, Other specified diseases of biliary tract CPT copyright 2019 American Medical Association. All rights reserved. The codes documented in this report are preliminary and upon coder review may  be revised to meet current compliance  requirements. Gatha Mayer, MD 01/21/2022 9:28:59 AM This report has been signed electronically. Number of Addenda: 0

## 2022-01-21 NOTE — H&P (Signed)
[facility] History and Physical   Primary Care Physician:  Tower, Wynelle Fanny, MD   Reason for Procedure:   Symptomatic CBD stones  Plan:    ERCP and bile duct stone removal     HPI: Morgan [name] is a 76 y.o. female here for treatment of CBD stones found on recent CT-A and MRCP. She has 2 stones on recent imaging which I have reviewed.   Past Medical History:  Diagnosis Date   Allergy    Bradycardia    Collagen vascular disease ([place])    Connective tissue disease ([place])    COVID-19    DDD (degenerative disc disease), cervical    DDD (degenerative disc disease), lumbar    Endometrial polyp    not resolved with D&C (following)   GERD (gastroesophageal reflux disease)    gas/belching issues-was on protonix for short time-not since 6 months   HLD (hyperlipidemia)    Mixed connective tissue disease ([place]) diagnosed 5 years ago   no problems since diagnosis   Multiple thyroid nodules 01/24/2014   Obesity    Osteoarthritis of cervical spine    Renal calculi    Spinal stenosis     Past Surgical History:  Procedure Laterality Date   APPENDECTOMY     CHOLECYSTECTOMY N/A 08/22/2021   Procedure: LAPAROSCOPIC CHOLECYSTECTOMY;  Surgeon: Coralie Keens, MD;  Location: [place];  Service: General;  Laterality: N/A;   COLONOSCOPY     DILATION AND CURETTAGE OF UTERUS     fibroids   DILATION AND CURETTAGE OF UTERUS  01/2005   endometrial polyp   ENDOMETRIAL BIOPSY  2008   fibroid   HAND SURGERY     carpal tunnel release bilateral Dr. Daylene Katayama   hysteroscopic resection      polyps of PMB   TONSILLECTOMY     TUBAL [place]      Prior to Admission medications   Medication Sig Start Date End Date Taking? Authorizing Provider  Cholecalciferol (VITAMIN D3) 25 MCG (1000 UT) CAPS Take 2,000 Units by mouth daily in the afternoon.   Yes [provider]  clobetasol (OLUX) 0.05 % topical foam Apply  topically 2 (two) times daily. Patient taking differently: Apply 1 Application topically daily as needed (Dermatitis). 09/13/21  Yes Moye, [place], MD  Cyanocobalamin (VITAMIN B12) 1000 MCG TBCR Take 1,000 mcg by mouth every Monday, Wednesday, and Friday.   Yes [provider]  famotidine (PEPCID) 20 MG tablet Take 1 tablet (20 mg total) by mouth at bedtime. 10/23/21  Yes Kennedy-Smith, Patrecia Pour, NP  halobetasol (ULTRAVATE) 0.05 % cream APPLY TOPICALLY 2 TIMES DAILY AS NEEDED FOR UP TO 14 DAYS. AVOID FACE, GROIN ANDAXILLA Patient taking differently: Apply 1 Application topically daily as needed (out break on skin). 08/07/21  Yes Moye, [place], MD  hydrocortisone valerate cream (WESTCORT) 0.2 % Apply 1 Application topically daily as needed (out break on skin). 05/10/20  Yes [provider]  Hyoscyamine Sulfate SL (LEVSIN/SL) 0.125 MG SUBL Place 1 tablet under the tongue daily as needed. Patient taking differently: Place 1 tablet under the tongue daily as needed (Stomach issues). 09/21/21  Yes Nandigam, Venia Minks, MD  levothyroxine (SYNTHROID) 25 MCG tablet Take 25 mcg by mouth daily before breakfast.   Yes [provider]  polyethylene glycol powder (GLYCOLAX/MIRALAX) 17 GM/SCOOP powder Take 17 g by mouth as needed for mild constipation or moderate constipation.   Yes [provider]  traMADol (ULTRAM) 50 MG tablet Take 1-2 tablets (50-100 mg total) by mouth every 6 (six) hours as needed. Patient taking differently: Take 50-100 mg by mouth every 6 (six) hours as needed for severe pain or moderate pain. 08/23/21  Yes Coralie Keens, MD  triamcinolone cream (KENALOG) 0.1 % Apply 1 Application topically daily as needed (out- break on skin). 03/20/20  Yes [provider]  valACYclovir (VALTREX) 1000 MG tablet TAKE 2 TABLET BY MOUTH AT FIRST ONSET OFSYMPTOMS OF COLD SORES, THEN TAKE 2 TABLETS BY MOUTH 12 HOURS LATER Patient taking differently: Take 1,000 mg by mouth  See admin instructions. TAKE 2 TABLET BY MOUTH AT FIRST ONSET OFSYMPTOMS OF COLD SORES, THEN TAKE 2 TABLETS BY MOUTH 12 HOURS LATER as needed 09/20/21  Yes Brendolyn Patty, MD    Current Facility-Administered Medications  Medication Dose Route Frequency Provider Last Rate Last Admin   0.9 %  sodium chloride infusion   Intravenous Continuous Gatha Mayer, MD       ciprofloxacin (CIPRO) IVPB 400 mg  400 mg Intravenous Once Gatha Mayer, MD       diclofenac suppository 100 mg  100 mg Rectal Once Gatha Mayer, MD        Allergies as of 01/10/2022 - Review Complete 12/21/2021  Allergen Reaction Noted   Codeine Nausea And Vomiting 01/19/2007   Hydrocodone-acetaminophen  01/19/2007   Penicillins Hives 01/19/2007    Family History  Problem Relation Age of Onset   Dementia Mother    Coronary artery disease Father    Heart attack Father        deceased at 42   Diabetes Maternal Grandmother    Goiter Maternal Grandmother    Cancer Grandchild        rare type (dying at age 64)   Diabetes Cousin        multiple   Colon cancer Neg Hx    Esophageal cancer Neg Hx    Stomach cancer Neg Hx    Rectal cancer Neg Hx    Colon polyps Neg Hx     Social History   Socioeconomic History   Marital status: Married    Spouse name: Not on file   Number of children: 4   Years of education: HS   Highest education level: Not on file  Occupational History   Occupation: Retired   Tobacco Use   Smoking status: Former    Types: Cigarettes    Quit date: 07/22/1965    Years since quitting: 56.5   Smokeless tobacco: Never   Tobacco comments:    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   Vaping Use: Never used  Substance and Sexual Activity   Alcohol use: No   Drug use: No   Sexual activity: Yes    Birth control/protection: Surgical    Comment: BTL  Other Topics Concern   Not on file  Social History Narrative   + caffeine use     Social Determinants of Health   Financial Resource  Strain: Low Risk  (09/26/2021)   Overall Financial Resource Strain (CARDIA)    Difficulty of Paying Living Expenses: Not hard at all  Food Insecurity: No Food Insecurity (09/26/2021)   Hunger Vital Sign    Worried About Running Out of Food in the Last Year: Never true    Ran Out of Food in the Last Year: Never true  Transportation Needs: No Transportation Needs (09/26/2021)   PRAPARE - Transportation  Lack of Transportation (Medical): No    Lack of Transportation (Non-Medical): No  Physical Activity: Inactive (09/26/2021)   Exercise Vital Sign    Days of Exercise per Week: 0 days    Minutes of Exercise per Session: 0 min  Stress: No Stress Concern Present (09/26/2021)   [place]    Feeling of Stress : Only a little  Social Connections: Moderately Integrated (09/26/2021)   Social Connection and Isolation Panel [NHANES]    Frequency of Communication with Friends and Family: More than three times a week    Frequency of Social Gatherings with Friends and Family: More than three times a week    Attends Religious Services: 1 to 4 times per year    Active Member of [employer] or Organizations: No    Attends Archivist Meetings: Never    Marital Status: Married  Human resources officer Violence: Not At Risk (09/26/2021)   Humiliation, Afraid, Rape, and Kick questionnaire    Fear of Current or Ex-Partner: No    Emotionally Abused: No    Physically Abused: No    Sexually Abused: No    Review of Systems:  All other review of systems negative except as mentioned in the HPI.  Physical Exam: Vital signs BP (!) 156/94   Pulse (!) 55   Temp 97.7 F (36.5 C) (Oral)   Resp 12   Ht '5\' 4"'$  (1.626 m)   Wt 64 kg   LMP 07/22/1989 (Approximate)   SpO2 98%   BMI 24.20 kg/m   General:   Alert,  Well-developed, well-nourished, pleasant and cooperative in NAD Lungs:  Clear throughout to auscultation.   Heart:  Regular rate and rhythm; no  murmurs, clicks, rubs,  or gallops. Abdomen:  Soft, nontender and nondistended. Normal bowel sounds.   Neuro/Psych:  Alert and cooperative. Normal mood and affect. A and O x 3   '@Leonda Cristo'$  Simonne Maffucci, MD, [facility] [phone] (pager) 01/21/2022 7:46 AM@

## 2022-01-22 ENCOUNTER — Encounter (HOSPITAL_COMMUNITY): Payer: Self-pay | Admitting: Internal Medicine

## 2022-01-23 ENCOUNTER — Telehealth: Payer: Self-pay | Admitting: Internal Medicine

## 2022-01-23 DIAGNOSIS — R1013 Epigastric pain: Secondary | ICD-10-CM

## 2022-01-23 NOTE — Telephone Encounter (Signed)
Inbound call from patient requesting to speak with a nurse in regards to her experiencing gas every time she eats. Patient say the gas she is experiencing can be very painful. Please give patient a call back to advise.  Thank you

## 2022-01-24 NOTE — Telephone Encounter (Signed)
I called the patient.  History as per RN.  No fever.  She is moving her bowels.  She has had similar symptoms before but says this pain is more intense than in the past.  I have asked her to stay on clear liquids only.  If it gets severe and intractable to go to the ED.  She has tried hyoscyamine and its not been helpful.  I have ordered CBC, CMET, amylase and lipase and she will do that tomorrow.  Further plans pending these results.

## 2022-01-24 NOTE — Telephone Encounter (Signed)
Inbound cal from patient. Patient calling back again requesting to speak with a nurse due to her still experiencing gas. Please give patient a call back to advise.  Thank you

## 2022-01-24 NOTE — Telephone Encounter (Signed)
Patient called in with complaints of gas pain & abdominal spasms that started post ERCP on 7/3. She says she has had gas pains before, but not like this. The spasms radiate to her back. Since the procedure her diet has consisted of liquids & bland foods, however the pain seems to be worse after meals. She is still having normal bowel movements. Denies nausea & vomiting. Patient advised to seek ED if symptoms worsen. Will route to MD for further recommendations.

## 2022-01-25 ENCOUNTER — Other Ambulatory Visit (INDEPENDENT_AMBULATORY_CARE_PROVIDER_SITE_OTHER): Payer: PPO

## 2022-01-25 ENCOUNTER — Telehealth: Payer: Self-pay | Admitting: Internal Medicine

## 2022-01-25 ENCOUNTER — Encounter: Payer: Self-pay | Admitting: Internal Medicine

## 2022-01-25 DIAGNOSIS — R1013 Epigastric pain: Secondary | ICD-10-CM

## 2022-01-25 LAB — CBC WITH DIFFERENTIAL/PLATELET
Basophils Absolute: 0 10*3/uL (ref 0.0–0.1)
Basophils Relative: 0.6 % (ref 0.0–3.0)
Eosinophils Absolute: 0.3 10*3/uL (ref 0.0–0.7)
Eosinophils Relative: 5.8 % — ABNORMAL HIGH (ref 0.0–5.0)
HCT: 37.6 % (ref 36.0–46.0)
Hemoglobin: 12.6 g/dL (ref 12.0–15.0)
Lymphocytes Relative: 21 % (ref 12.0–46.0)
Lymphs Abs: 1.1 10*3/uL (ref 0.7–4.0)
MCHC: 33.6 g/dL (ref 30.0–36.0)
MCV: 92.7 fl (ref 78.0–100.0)
Monocytes Absolute: 0.6 10*3/uL (ref 0.1–1.0)
Monocytes Relative: 12.4 % — ABNORMAL HIGH (ref 3.0–12.0)
Neutro Abs: 3 10*3/uL (ref 1.4–7.7)
Neutrophils Relative %: 60.2 % (ref 43.0–77.0)
Platelets: 198 10*3/uL (ref 150.0–400.0)
RBC: 4.06 Mil/uL (ref 3.87–5.11)
RDW: 14 % (ref 11.5–15.5)
WBC: 5.1 10*3/uL (ref 4.0–10.5)

## 2022-01-25 LAB — COMPREHENSIVE METABOLIC PANEL
ALT: 13 U/L (ref 0–35)
AST: 16 U/L (ref 0–37)
Albumin: 4.1 g/dL (ref 3.5–5.2)
Alkaline Phosphatase: 65 U/L (ref 39–117)
BUN: 13 mg/dL (ref 6–23)
CO2: 31 mEq/L (ref 19–32)
Calcium: 9.2 mg/dL (ref 8.4–10.5)
Chloride: 103 mEq/L (ref 96–112)
Creatinine, Ser: 0.8 mg/dL (ref 0.40–1.20)
GFR: 72.05 mL/min (ref >60.00)
Glucose, Bld: 80 mg/dL (ref 70–99)
Potassium: 4.1 mEq/L (ref 3.5–5.1)
Sodium: 141 mEq/L (ref 135–145)
Total Bilirubin: 0.8 mg/dL (ref 0.2–1.2)
Total Protein: 6.2 g/dL (ref 6.0–8.3)

## 2022-01-25 LAB — AMYLASE: Amylase: 38 U/L (ref 27–131)

## 2022-01-25 LAB — LIPASE: Lipase: 11 U/L (ref 11.0–59.0)

## 2022-01-25 NOTE — Telephone Encounter (Signed)
This encounter was created in error - please disregard.

## 2022-01-25 NOTE — Telephone Encounter (Signed)
Called patient to follow-up on lab testing I had her do today because of upper abdominal pain after ERCP.  CBC, CMET, amylase and lipase are all normal.  The patient is better on a clear liquid diet but says the abdominal pain she is having is something that she has never had before.  Review of the chart makes me wonder if its not just a variation of chronic recurrent abdominal cramps and "gas".  She has had refractory symptoms times years.  I do not think there is been any complication from the ERCP to the best of my knowledge.   She was advised to continue clear liquids today and to take Mylanta before meals, and try to slowly advance diet starting tomorrow.  She is advised to call back if she deteriorates.     She needs a follow-up with an app or primary GI physician.  Question if this really is not a variation of the chronic recurrent symptoms that persist despite removal of choledocholithiasis.  Indication for MRCP was severe epigastric abdominal pain?

## 2022-01-28 ENCOUNTER — Telehealth: Payer: Self-pay | Admitting: Gastroenterology

## 2022-01-28 ENCOUNTER — Telehealth: Payer: Self-pay

## 2022-01-28 DIAGNOSIS — M545 Low back pain, unspecified: Secondary | ICD-10-CM | POA: Diagnosis not present

## 2022-01-28 DIAGNOSIS — M5416 Radiculopathy, lumbar region: Secondary | ICD-10-CM | POA: Diagnosis not present

## 2022-01-28 DIAGNOSIS — M47816 Spondylosis without myelopathy or radiculopathy, lumbar region: Secondary | ICD-10-CM | POA: Diagnosis not present

## 2022-01-28 DIAGNOSIS — M5136 Other intervertebral disc degeneration, lumbar region: Secondary | ICD-10-CM | POA: Diagnosis not present

## 2022-01-28 NOTE — Telephone Encounter (Signed)
Duplicated telephone encounter. See other telephone encounter of 01/28/22.

## 2022-01-28 NOTE — Telephone Encounter (Signed)
Called and spoke with patient. She reports that she has been having gas all day and night. Pt reports that she has lost about 4lbs over the last week. Pt reports that her abdomen is firm. She reports that she is having regular BMs. Her last BM was this morning and she felt like she emptied completely. Pt reports that she has had a hx of constipation but she took Miralax yesterday. Pt reports Mylanta before meals did not help. Pt has been trying a gluten free diet for the past 3 days and has not noticed any change in symptoms. Pt wondered if she needed to be testing for a gluten allergy or lactose intolerance. Pt reports that she has been dealing with this for the last week and has not gotten any better. I scheduled her for a follow up with you on Thursday, 01/31/22 at 3:50 pm. Please advise, thanks.

## 2022-01-28 NOTE — Telephone Encounter (Signed)
Patient has an appointment scheduled with Dr. Silverio Decamp on Thursday.  She wanted to know if she should have bloodwork prior to coming in to possibly check for gluten or lactose intolerance.  Please call patient and advise.  Thank you.

## 2022-01-28 NOTE — Telephone Encounter (Signed)
Thanks Glendell Docker. Beth, can you please bring her in for office visit soon with me or app within the next 1 to 2 weeks.  Thank you

## 2022-01-28 NOTE — Telephone Encounter (Signed)
Pt calling stating she is still having lots of pain related to gas, pt recently had an ERCP. Would like to speak with Alen Blew PA as she has seen her in the past.

## 2022-01-28 NOTE — Telephone Encounter (Signed)
[place], RN contacted the patient this morning and has her scheduled.

## 2022-01-30 ENCOUNTER — Ambulatory Visit (HOSPITAL_BASED_OUTPATIENT_CLINIC_OR_DEPARTMENT_OTHER): Payer: PPO | Admitting: Cardiology

## 2022-01-31 ENCOUNTER — Other Ambulatory Visit (INDEPENDENT_AMBULATORY_CARE_PROVIDER_SITE_OTHER): Payer: PPO

## 2022-01-31 ENCOUNTER — Ambulatory Visit: Payer: PPO | Admitting: Gastroenterology

## 2022-01-31 VITALS — BP 104/74 | HR 59 | Ht 64.0 in | Wt 132.0 lb

## 2022-01-31 DIAGNOSIS — R1012 Left upper quadrant pain: Secondary | ICD-10-CM

## 2022-01-31 DIAGNOSIS — R109 Unspecified abdominal pain: Secondary | ICD-10-CM

## 2022-01-31 DIAGNOSIS — R143 Flatulence: Secondary | ICD-10-CM

## 2022-01-31 LAB — CBC WITH DIFFERENTIAL/PLATELET
Basophils Absolute: 0 10*3/uL (ref 0.0–0.1)
Basophils Relative: 0.7 % (ref 0.0–3.0)
Eosinophils Absolute: 0.2 10*3/uL (ref 0.0–0.7)
Eosinophils Relative: 3.5 % (ref 0.0–5.0)
HCT: 38.3 % (ref 36.0–46.0)
Hemoglobin: 12.9 g/dL (ref 12.0–15.0)
Lymphocytes Relative: 24.3 % (ref 12.0–46.0)
Lymphs Abs: 1.4 10*3/uL (ref 0.7–4.0)
MCHC: 33.7 g/dL (ref 30.0–36.0)
MCV: 93 fl (ref 78.0–100.0)
Monocytes Absolute: 0.9 10*3/uL (ref 0.1–1.0)
Monocytes Relative: 15 % — ABNORMAL HIGH (ref 3.0–12.0)
Neutro Abs: 3.3 10*3/uL (ref 1.4–7.7)
Neutrophils Relative %: 56.5 % (ref 43.0–77.0)
Platelets: 210 10*3/uL (ref 150.0–400.0)
RBC: 4.12 Mil/uL (ref 3.87–5.11)
RDW: 13.9 % (ref 11.5–15.5)
WBC: 5.8 10*3/uL (ref 4.0–10.5)

## 2022-01-31 LAB — COMPREHENSIVE METABOLIC PANEL
ALT: 11 U/L (ref 0–35)
AST: 16 U/L (ref 0–37)
Albumin: 4.3 g/dL (ref 3.5–5.2)
Alkaline Phosphatase: 80 U/L (ref 39–117)
BUN: 26 mg/dL — ABNORMAL HIGH (ref 6–23)
CO2: 32 mEq/L (ref 19–32)
Calcium: 9.3 mg/dL (ref 8.4–10.5)
Chloride: 104 mEq/L (ref 96–112)
Creatinine, Ser: 0.81 mg/dL (ref 0.40–1.20)
GFR: 70.98 mL/min (ref >60.00)
Glucose, Bld: 97 mg/dL (ref 70–99)
Potassium: 4.3 mEq/L (ref 3.5–5.1)
Sodium: 139 mEq/L (ref 135–145)
Total Bilirubin: 0.4 mg/dL (ref 0.2–1.2)
Total Protein: 6.4 g/dL (ref 6.0–8.3)

## 2022-01-31 LAB — LIPASE: Lipase: 17 U/L (ref 11.0–59.0)

## 2022-01-31 MED ORDER — BUSPIRONE HCL 5 MG PO TABS: 5.0000 mg | ORAL_TABLET | Freq: Every day | ORAL | 1 refills | Status: DC

## 2022-01-31 NOTE — Progress Notes (Signed)
Morgan [name]    [id number]    Jun 24, 1946  Primary Care Physician:Tower, Wynelle Fanny, MD  Referring Physician: Tower, Wynelle Fanny, MD [name],  [name] [id number]   Chief complaint:  Abdominal pain  HPI:  76 year old very pleasant female here for follow-up visit for generalized abdominal cramping, bloating and excess gas She has history of chronic irritable bowel syndrome predominant diarrhea and small intestinal bacterial overgrowth, which improved after course of Xifaxan.  She was relatively symptom-free for the past year until this past week when she developed generalized abdominal cramping associated with abdominal bloating and excess gas.   She is status postcholecystectomy August 22, 2021, and ERCP January 21, 2022 for retained biliary stone  She continues to have abdominal discomfort, postprandial bloating and excessive gas.  LFT, lipase and CBC within normal range     CT abdomen and pelvis September 22, 2020: Unremarkable with no acute pathology    EGD March 17, 2020: LA grade B reflux esophagitis, gastroesophageal flap valve Hill grade 3, small hiatal hernia.   Colonoscopy April 13, 2019: Left-sided diverticulosis and hemorrhoids otherwise unremarkable exam.   Outpatient Encounter Medications as of 01/31/2022  Medication Sig   Cholecalciferol (VITAMIN D3) 25 MCG (1000 UT) CAPS Take 2,000 Units by mouth daily in the afternoon.   clobetasol (OLUX) 0.05 % topical foam Apply topically 2 (two) times daily. (Patient taking differently: Apply 1 Application topically daily as needed (Dermatitis).)   Cyanocobalamin (VITAMIN B12) 1000 MCG TBCR Take 1,000 mcg by mouth every Monday, Wednesday, and Friday.   famotidine (PEPCID) 20 MG tablet Take 1 tablet (20 mg total) by mouth at bedtime.   halobetasol (ULTRAVATE) 0.05 % cream APPLY TOPICALLY 2 TIMES DAILY AS NEEDED FOR UP TO 14 DAYS. AVOID FACE, GROIN ANDAXILLA (Patient taking differently: Apply 1 Application  topically daily as needed (out break on skin).)   hydrocortisone valerate cream (WESTCORT) 0.2 % Apply 1 Application topically daily as needed (out break on skin).   Hyoscyamine Sulfate SL (LEVSIN/SL) 0.125 MG SUBL Place 1 tablet under the tongue daily as needed. (Patient taking differently: Place 1 tablet under the tongue daily as needed (Stomach issues).)   levothyroxine (SYNTHROID) 25 MCG tablet Take 25 mcg by mouth daily before breakfast.   polyethylene glycol powder (GLYCOLAX/MIRALAX) 17 GM/SCOOP powder Take 17 g by mouth as needed for mild constipation or moderate constipation.   traMADol (ULTRAM) 50 MG tablet Take 1-2 tablets (50-100 mg total) by mouth every 6 (six) hours as needed. (Patient taking differently: Take 50-100 mg by mouth every 6 (six) hours as needed for severe pain or moderate pain.)   triamcinolone cream (KENALOG) 0.1 % Apply 1 Application topically daily as needed (out- break on skin).   valACYclovir (VALTREX) 1000 MG tablet TAKE 2 TABLET BY MOUTH AT FIRST ONSET OFSYMPTOMS OF COLD SORES, THEN TAKE 2 TABLETS BY MOUTH 12 HOURS LATER (Patient taking differently: Take 1,000 mg by mouth See admin instructions. TAKE 2 TABLET BY MOUTH AT FIRST ONSET OFSYMPTOMS OF COLD SORES, THEN TAKE 2 TABLETS BY MOUTH 12 HOURS LATER as needed)   No facility-administered encounter medications on file as of 01/31/2022.    Allergies as of 01/31/2022 - Review Complete 01/21/2022  Allergen Reaction Noted   Codeine Nausea And Vomiting 01/19/2007   Hydrocodone-acetaminophen  01/19/2007   Penicillins Hives 01/19/2007    Past Medical History:  Diagnosis Date  Allergy    Bradycardia    Collagen vascular disease (HCC)    Connective tissue disease ([place])    COVID-19    DDD (degenerative disc disease), cervical    DDD (degenerative disc disease), lumbar    Endometrial polyp    not resolved with D&C (following)   GERD (gastroesophageal reflux disease)    gas/belching issues-was on protonix for  short time-not since 6 months   HLD (hyperlipidemia)    Mixed connective tissue disease ([place]) diagnosed 5 years ago   no problems since diagnosis   Multiple thyroid nodules 01/24/2014   Obesity    Osteoarthritis of cervical spine    Renal calculi    Spinal stenosis     Past Surgical History:  Procedure Laterality Date   APPENDECTOMY     CHOLECYSTECTOMY N/A 08/22/2021   Procedure: LAPAROSCOPIC CHOLECYSTECTOMY;  Surgeon: Morgan Keens, MD;  Location: [place];  Service: General;  Laterality: N/A;   COLONOSCOPY     DILATION AND CURETTAGE OF UTERUS     fibroids   DILATION AND CURETTAGE OF UTERUS  01/2005   endometrial polyp   ENDOMETRIAL BIOPSY  2008   fibroid   ENDOSCOPIC RETROGRADE CHOLANGIOPANCREATOGRAPHY (ERCP) WITH PROPOFOL N/A 01/21/2022   Procedure: ENDOSCOPIC RETROGRADE CHOLANGIOPANCREATOGRAPHY (ERCP) WITH PROPOFOL;  Surgeon: Morgan Mayer, MD;  Location: [place];  Service: Gastroenterology;  Laterality: N/A;   HAND SURGERY     carpal tunnel release bilateral Dr. Daylene [name]   hysteroscopic resection      polyps of PMB   REMOVAL OF STONES  01/21/2022   Procedure: REMOVAL OF STONES;  Surgeon: Morgan Mayer, MD;  Location: [facility];  Service: Gastroenterology;;   Joan Mayans  01/21/2022   Procedure: Joan Mayans;  Surgeon: Morgan Mayer, MD;  Location: [facility] ENDOSCOPY;  Service: Gastroenterology;;   TONSILLECTOMY     TUBAL LIGATION     1973   UPPER GASTROINTESTINAL ENDOSCOPY      Family History  Problem Relation Age of Onset   Dementia Mother    Coronary artery disease Father    Heart attack Father        deceased at 30   Diabetes Maternal Grandmother    Goiter Maternal Grandmother    Cancer Grandchild        rare type (dying at age 25)   Diabetes Cousin        multiple   Colon cancer Neg Hx    Esophageal cancer Neg Hx    Stomach cancer Neg Hx    Rectal cancer Neg Hx    Colon polyps Neg Hx     Social History   Socioeconomic History    Marital status: Married    Spouse name: Not on file   Number of children: 4   Years of education: HS   Highest education level: Not on file  Occupational History   Occupation: Retired   Tobacco Use   Smoking status: Former    Types: Cigarettes    Quit date: 07/22/1965    Years since quitting: 56.5   Smokeless tobacco: Never   Tobacco comments:    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   Vaping Use: Never used  Substance and Sexual Activity   Alcohol use: No   Drug use: No   Sexual activity: Yes    Birth control/protection: Surgical    Comment: BTL  Other Topics Concern   Not on file  Social History Narrative   + caffeine use  Social Determinants of Health   Financial Resource Strain: Low Risk  (09/26/2021)   Overall Financial Resource Strain (CARDIA)    Difficulty of Paying Living Expenses: Not hard at all  Food Insecurity: No Food Insecurity (09/26/2021)   Hunger Vital Sign    Worried About Running Out of Food in the Last Year: Never true    Ran Out of Food in the Last Year: Never true  Transportation Needs: No Transportation Needs (09/26/2021)   PRAPARE - Hydrologist (Medical): No    Lack of Transportation (Non-Medical): No  Physical Activity: Inactive (09/26/2021)   Exercise Vital Sign    Days of Exercise per Week: 0 days    Minutes of Exercise per Session: 0 min  Stress: No Stress Concern Present (09/26/2021)   [place]    Feeling of Stress : Only a little  Social Connections: Moderately Integrated (09/26/2021)   Social Connection and Isolation Panel [NHANES]    Frequency of Communication with Friends and Family: More than three times a week    Frequency of Social Gatherings with Friends and Family: More than three times a week    Attends Religious Services: 1 to 4 times per year    Active Member of [employer] or Organizations: No    Attends Archivist Meetings:  Never    Marital Status: Married  Human resources officer Violence: Not At Risk (09/26/2021)   Humiliation, Afraid, Rape, and Kick questionnaire    Fear of Current or Ex-Partner: No    Emotionally Abused: No    Physically Abused: No    Sexually Abused: No      Review of systems: All other review of systems negative except as mentioned in the HPI.   Physical Exam: Vitals:   01/31/22 1548  BP: 104/74  Pulse: (!) 59  SpO2: 97%   Body mass index is 22.66 kg/m. Gen:      No acute distress HEENT:  sclera anicteric Abd:      soft, non-tender; no palpable masses, no distension Ext:    No edema Neuro: alert and oriented x 3 Psych: normal mood and affect  Data Reviewed:  Reviewed labs, radiology imaging, old records and pertinent past GI work up   Assessment and Plan/Recommendations:  76 year old very pleasant female with chronic GERD, esophageal spasms, IBS constipation and abdominal bloating status postcholecystectomy for cholelithiasis and cholecystitis in February 2023, s/p ERCP January 21, 2022 here for follow-up visit with complaints of generalized abdominal cramping, abdominal bloating and excess gas   Irritable bowel syndrome, dyspepsia, excess gas: Advised patient to use simethicone/Gas-X 1 capsule up to 3 times daily as needed postprandial to decrease postprandial bloating and gas Add BuSpar 5 mg daily at bedtime  Discussed lactose-free diet as lactose intolerance could be exacerbating her dyspepsia symptoms   If continues to have persistent symptoms, will empirically treat with course of Xifaxan for small intestinal bacterial overgrowth.  Rifaximin 550 mg 3 times daily for 2 weeks   GERD: She is hesitant to take any medications, currently symptoms are manageable Continue antireflux measures  Return in 6 to 8 weeks  This visit required >40 minutes of patient care (this includes precharting, chart review, review of results, face-to-face time used for counseling as well as  treatment plan and follow-up. The patient was provided an opportunity to ask questions and all were answered. The patient agreed with the plan and demonstrated an understanding of the instructions.  Damaris Hippo , MD    CC: Tower, Wynelle Fanny, MD

## 2022-01-31 NOTE — Patient Instructions (Signed)
Your provider has requested that you go to the basement level for lab work before leaving today. Press "B" on the elevator. The lab is located at the first door on the left as you exit the elevator.   Use GAS-X 1 capsule three times a day as needed  Take Buspar 5 mg at bedtime  Lactose-Free Diet, Adult If you have lactose intolerance, you are not able to digest lactose. Lactose is a natural sugar found mainly in dairy milk and dairy products. A lactose-free diet can help you avoid foods and beverages that contain lactose. What are tips for following this plan? Reading food labels Do not consume foods, beverages, vitamins, minerals, or medicines containing lactose. Read ingredient lists carefully. Look for the words "lactose-free" on labels. Meal planning Use alternatives to dairy milk and foods made with milk products. These include the following: Lactose-free milk. Soy milk with added calcium and vitamin D. Almond milk, coconut milk, rice milk, or other nondairy milk alternatives with added calcium and vitamin D. Note that a lot of these are low in protein. Soy products, such as soy yogurt, soy cheese, soy ice cream, and soy-based sour cream. Other nut milk products, such as almond yogurt, almond cheese, cashew yogurt, cashew cheese, cashew ice cream, coconut yogurt, and coconut ice cream. Medicines, vitamins, and supplements Use lactase enzyme drops or tablets as directed by your health care provider. Make sure you get enough calcium and vitamin D in your diet. A lactose-free eating plan can be lacking in these important nutrients. Take calcium and vitamin D supplements as directed by your health care provider. Talk with your health care provider about supplements if you are not able to get enough calcium and vitamin D from food. What foods should I eat?  Fruits All fresh, canned, frozen, or dried fruits and fruit juices that are not processed with lactose. Vegetables All fresh, frozen,  and canned vegetables without cheese, cream, or butter sauces. Grains Any that are not made with dairy milk or dairy products. Meats and other proteins Any meat, fish, poultry, and other protein sources that are not made with dairy milk or dairy products. Fats and oils Any that are not made with dairy milk or dairy products. Sweets and desserts Any that are not made with dairy milk or dairy products. Seasonings and condiments Any that are not made with dairy milk or dairy products. Calcium Calcium is found in many foods that contain lactose and is important for bone health. The amount of calcium you need depends on your age: Adults younger than 50 years: 1,000 mg of calcium a day. Adults older than 50 years: 1,200 mg of calcium a day. If you are not getting enough calcium, you may get it from other sources, including: Orange juice that has been fortified with calcium. This means that calcium has been added to the product. There are 300-350 mg of calcium in 1 cup (237 mL) of calcium-fortified orange juice. Soy milk fortified with calcium. There are 300-400 mg of calcium in 1 cup (237 mL) of calcium-fortified soy milk. Rice or almond milk fortified with calcium. There are 300 mg of calcium in 1 cup (237 mL) of calcium-fortified rice or almond milk. Breakfast cereals fortified with calcium. There are 100-1,000 mg of calcium in calcium-fortified breakfast cereals. Spinach, cooked. There are 145 mg of calcium in  cup (90 g) of cooked spinach. Edamame, cooked. There are 130 mg of calcium in  cup (47 g) of cooked edamame. Collard greens,  cooked. There are 125 mg of calcium in  cup (85 g) of cooked collard greens. Kale, frozen or cooked. There are 90 mg of calcium in  cup (59 g) of cooked or frozen kale. Almonds. There are 95 mg of calcium in  cup (35 g) of almonds. Broccoli, cooked. There are 60 mg of calcium in 1 cup (156 g) of cooked broccoli. The items listed above may not be a complete  list of foods and beverages you can eat. Contact a dietitian for more options. What foods should I avoid? Lactose is found in dairy milk and dairy products, such as: Yogurt. Cheese. Butter. Margarine. Sour cream. Cream. Whipped toppings and creamers. Ice cream and other dairy-based desserts. Lactose is also found in foods or products made with dairy milk or milk ingredients. To find out whether a food contains dairy milk or a milk ingredient, look at the ingredients list. Avoid foods with the statement "May contain milk" and foods that contain: Milk powder. Whey. Curd. Lactose. Lactoglobulin. The items listed above may not be a complete list of foods and beverages to avoid. Contact a dietitian for more information. Where to find more information [employer] of Diabetes and Digestive and Kidney Diseases: [email or web address] Summary If you are lactose intolerant, it means that you are not able to digest lactose, a natural sugar found in milk and milk products. Following a lactose-free diet can help you manage this condition. Calcium is important for bone health and is found in many foods that contain lactose. Talk with your health care provider about other sources of calcium. This information is not intended to replace advice given to you by your health care provider. Make sure you discuss any questions you have with your health care provider. Document Revised: 06/13/2020 Document Reviewed: 06/13/2020 Elsevier Patient Education  [place].  If you are age 72 or older, your body mass index should be between 23-30. Your Body mass index is 22.66 kg/m. If this is out of the aforementioned range listed, please consider follow up with your Primary Care Provider.  If you are age 57 or younger, your body mass index should be between 19-25. Your Body mass index is 22.66 kg/m. If this is out of the aformentioned range listed, please consider follow up with your Primary Care  Provider.   ________________________________________________________  The [facility] providers would like to encourage you to use [facility] to communicate with providers for non-urgent requests or questions.  Due to long hold times on the telephone, sending your provider a message by [facility] may be a faster and more efficient way to get a response.  Please allow 48 business hours for a response.  Please remember that this is for non-urgent requests.  _______________________________________________________   Due to recent changes in healthcare laws, you may see the results of your imaging and laboratory studies on MyChart before your provider has had a chance to review them.  We understand that in some cases there may be results that are confusing or concerning to you. Not all laboratory results come back in the same time frame and the provider may be waiting for multiple results in order to interpret others.  Please give [place] 48 hours in order for your provider to thoroughly review all the results before contacting the office for clarification of your results.    Thank you for choosing [facility]  Karleen Hampshire Nand[place]am,MD

## 2022-02-01 LAB — TISSUE TRANSGLUTAMINASE, IGA: (tTG) Ab, IgA: <1 U/mL

## 2022-02-01 LAB — IGA: Immunoglobulin A: 82 mg/dL (ref 70–320)

## 2022-02-04 ENCOUNTER — Telehealth: Payer: Self-pay | Admitting: Gastroenterology

## 2022-02-04 NOTE — Telephone Encounter (Signed)
Inbound call from patient requesting a call back in regards to results. Please give a patient a call back at your earliest convinence to advise.  Thank you

## 2022-02-05 NOTE — Telephone Encounter (Signed)
Results are available now. She has seen them in her My Chart.

## 2022-02-06 NOTE — Telephone Encounter (Signed)
Patient advised. She states she is taking the Buspar and will work more diligently on being lactose free.

## 2022-02-06 NOTE — Telephone Encounter (Signed)
All labs are reassuring.  She is negative for celiac disease or gluten allergy.  Please advise patient to continue with lactose free diet and start taking BuSpar 5 mg daily at bedtime that was prescribed during the office visit.  Thank you

## 2022-02-18 ENCOUNTER — Encounter: Payer: Self-pay | Admitting: Gastroenterology

## 2022-03-07 ENCOUNTER — Ambulatory Visit (HOSPITAL_BASED_OUTPATIENT_CLINIC_OR_DEPARTMENT_OTHER): Payer: PPO | Admitting: Cardiology

## 2022-03-18 ENCOUNTER — Ambulatory Visit (INDEPENDENT_AMBULATORY_CARE_PROVIDER_SITE_OTHER): Payer: PPO | Admitting: Physician Assistant

## 2022-03-18 ENCOUNTER — Encounter: Payer: Self-pay | Admitting: Physician Assistant

## 2022-03-18 VITALS — BP 104/60 | HR 56 | Ht 64.0 in | Wt 144.4 lb

## 2022-03-18 DIAGNOSIS — R143 Flatulence: Secondary | ICD-10-CM

## 2022-03-18 DIAGNOSIS — R142 Eructation: Secondary | ICD-10-CM

## 2022-03-18 NOTE — Progress Notes (Signed)
Chief Complaint: Gas and eructations  HPI:    Morgan [name] is a 76 year old Caucasian female who was referred to me by Abner Greenspan, MD for follow-up of gas and eructations.    12/18/2021 patient seen in clinic by me for severe abdominal pain and gas    12/21/2021 CT angio abdomen and pelvis with and without contrast for epigastric pain and increased gas with question of mesenteric ischemia.  No significant abnormality of the mesenteric arteries or veins but filling defects in the distal common bile duct.    01/09/2022 MRCP with and without contrast with 2 stones in the distal common bile duct with intra and extrahepatic biliary ductal dilation, status postcholecystectomy.    01/21/2022 ERCP for choledocholithiasis with complete removal accomplished by biliary sphincterotomy and balloon extraction.    01/31/2022 follow-up with Dr. Silverio Decamp at that time described that a week before she developed generalized abdominal cramping with abdominal bloating and excessive gas.  Noted that she had been treated before with Xifaxan for SIBO and had done well.  Labs were normal.  She was told to try Gas-X 1 capsule up to 3 times a day as needed and add BuSpar 5 mg at bedtime as well as start a lactose-free diet.  She was also given Rifaximin 550 3 times daily x2 weeks for SIBO.    Today, the patient tells me that her symptoms are back to her normal which is just eructations and hearing her stomach grumble at all times the day, this really does not interfere with her life.  She did try lactose-free diet which made no difference.  Also tried Gas-X which made no difference and also tried BuSpar 5 mg in the evenings for 3 weeks but this made no difference so she stopped all of these medicines.  Apparently she picked up the Xifaxan and read the first side effect was possible C. difficile and decided not to take that either.  In general she feels okay at the moment and is not extremely bothered by her symptoms.  Explains that if  they get worse then she will try the Xifaxan.  Otherwise has no complaints or concerns.  Very grateful that we found the stones in her bile duct, she felt like getting these removed helped a lot of her problems.    Denies fever, chills, weight loss, blood in her stool or symptoms that awaken her from sleep.   Past Medical History:  Diagnosis Date   Allergy    Bradycardia    Collagen vascular disease ([place])    Connective tissue disease ([place])    COVID-19    DDD (degenerative disc disease), cervical    DDD (degenerative disc disease), lumbar    Endometrial polyp    not resolved with D&C (following)   GERD (gastroesophageal reflux disease)    gas/belching issues-was on protonix for short time-not since 6 months   HLD (hyperlipidemia)    Mixed connective tissue disease ([place]) diagnosed 5 years ago   no problems since diagnosis   Multiple thyroid nodules 01/24/2014   Obesity    Osteoarthritis of cervical spine    Renal calculi    Spinal stenosis     Past Surgical History:  Procedure Laterality Date   APPENDECTOMY     CHOLECYSTECTOMY N/A 08/22/2021   Procedure: LAPAROSCOPIC CHOLECYSTECTOMY;  Surgeon: Coralie Keens, MD;  Location: [place];  Service: General;  Laterality: N/A;   COLONOSCOPY     DILATION AND CURETTAGE OF UTERUS  fibroids   DILATION AND CURETTAGE OF UTERUS  01/2005   endometrial polyp   ENDOMETRIAL BIOPSY  2008   fibroid   ENDOSCOPIC RETROGRADE CHOLANGIOPANCREATOGRAPHY (ERCP) WITH PROPOFOL N/A 01/21/2022   Procedure: ENDOSCOPIC RETROGRADE CHOLANGIOPANCREATOGRAPHY (ERCP) WITH PROPOFOL;  Surgeon: Gatha Mayer, MD;  Location: [facility];  Service: Gastroenterology;  Laterality: N/A;   HAND SURGERY     carpal tunnel release bilateral Dr. Daylene Katayama   hysteroscopic resection      polyps of PMB   REMOVAL OF STONES  01/21/2022   Procedure: REMOVAL OF STONES;  Surgeon: Gatha Mayer, MD;  Location: [place];  Service: Gastroenterology;;    Joan Mayans  01/21/2022   Procedure: Joan Mayans;  Surgeon: Gatha Mayer, MD;  Location: [facility] ENDOSCOPY;  Service: Gastroenterology;;   TONSILLECTOMY     TUBAL LIGATION     1973   UPPER GASTROINTESTINAL ENDOSCOPY      Current Outpatient Medications  Medication Sig Dispense Refill   busPIRone (BUSPAR) 5 MG tablet Take 1 tablet (5 mg total) by mouth at bedtime. 30 tablet 1   Cholecalciferol (VITAMIN D3) 25 MCG (1000 UT) CAPS Take 2,000 Units by mouth daily in the afternoon.     clobetasol (OLUX) 0.05 % topical foam Apply topically 2 (two) times daily. (Patient taking differently: Apply 1 Application topically daily as needed (Dermatitis).) 50 g 5   Cyanocobalamin (VITAMIN B12) 1000 MCG TBCR Take 1,000 mcg by mouth every Monday, Wednesday, and Friday.     famotidine (PEPCID) 20 MG tablet Take 1 tablet (20 mg total) by mouth at bedtime. 30 tablet 2   halobetasol (ULTRAVATE) 0.05 % cream APPLY TOPICALLY 2 TIMES DAILY AS NEEDED FOR UP TO 14 DAYS. AVOID FACE, GROIN ANDAXILLA (Patient taking differently: Apply 1 Application topically daily as needed (out break on skin).) 30 g 0   hydrocortisone valerate cream (WESTCORT) 0.2 % Apply 1 Application topically daily as needed (out break on skin).     Hyoscyamine Sulfate SL (LEVSIN/SL) 0.125 MG SUBL Place 1 tablet under the tongue daily as needed. (Patient taking differently: Place 1 tablet under the tongue daily as needed (Stomach issues).) 30 tablet 1   levothyroxine (SYNTHROID) 25 MCG tablet Take 25 mcg by mouth daily before breakfast.     polyethylene glycol powder (GLYCOLAX/MIRALAX) 17 GM/SCOOP powder Take 17 g by mouth as needed for mild constipation or moderate constipation.     traMADol (ULTRAM) 50 MG tablet Take 1-2 tablets (50-100 mg total) by mouth every 6 (six) hours as needed. (Patient taking differently: Take 50-100 mg by mouth every 6 (six) hours as needed for severe pain or moderate pain.) 20 tablet 0   triamcinolone cream (KENALOG) 0.1 %  Apply 1 Application topically daily as needed (out- break on skin).     valACYclovir (VALTREX) 1000 MG tablet TAKE 2 TABLET BY MOUTH AT FIRST ONSET OFSYMPTOMS OF COLD SORES, THEN TAKE 2 TABLETS BY MOUTH 12 HOURS LATER (Patient taking differently: Take 1,000 mg by mouth See admin instructions. TAKE 2 TABLET BY MOUTH AT FIRST ONSET OFSYMPTOMS OF COLD SORES, THEN TAKE 2 TABLETS BY MOUTH 12 HOURS LATER as needed) 30 tablet 10   No current facility-administered medications for this visit.    Allergies as of 03/18/2022 - Review Complete 03/18/2022  Allergen Reaction Noted   Codeine Nausea And Vomiting 01/19/2007   Hydrocodone-acetaminophen  01/19/2007   Penicillins Hives 01/19/2007    Family History  Problem Relation Age of Onset   Dementia Mother  Coronary artery disease Father    Heart attack Father        deceased at 37   Diabetes Maternal Grandmother    Goiter Maternal Grandmother    Cancer Grandchild        rare type (dying at age 67)   Diabetes Cousin        multiple   Colon cancer Neg Hx    Esophageal cancer Neg Hx    Stomach cancer Neg Hx    Rectal cancer Neg Hx    Colon polyps Neg Hx     Social History   Socioeconomic History   Marital status: Married    Spouse name: Not on file   Number of children: 4   Years of education: HS   Highest education level: Not on file  Occupational History   Occupation: Retired   Tobacco Use   Smoking status: Former    Types: Cigarettes    Quit date: 07/22/1965    Years since quitting: 56.6   Smokeless tobacco: Never   Tobacco comments:    Smoked for 2 years as a teen  Scientific laboratory technician Use: Never used  Substance and Sexual Activity   Alcohol use: No   Drug use: No   Sexual activity: Yes    Birth control/protection: Surgical    Comment: BTL  Other Topics Concern   Not on file  Social History Narrative   + caffeine use     Social Determinants of Health   Financial Resource Strain: Low Risk  (09/26/2021)   Overall  Financial Resource Strain (CARDIA)    Difficulty of Paying Living Expenses: Not hard at all  Food Insecurity: No Food Insecurity (09/26/2021)   Hunger Vital Sign    Worried About Running Out of Food in the Last Year: Never true    [place] in the Last Year: Never true  Transportation Needs: No Transportation Needs (09/26/2021)   PRAPARE - Hydrologist (Medical): No    Lack of Transportation (Non-Medical): No  Physical Activity: Inactive (09/26/2021)   Exercise Vital Sign    Days of Exercise per Week: 0 days    Minutes of Exercise per Session: 0 min  Stress: No Stress Concern Present (09/26/2021)   [place]    Feeling of Stress : Only a little  Social Connections: Moderately Integrated (09/26/2021)   Social Connection and Isolation Panel [NHANES]    Frequency of Communication with Friends and Family: More than three times a week    Frequency of Social Gatherings with Friends and Family: More than three times a week    Attends Religious Services: 1 to 4 times per year    Active Member of [employer] or Organizations: No    Attends Archivist Meetings: Never    Marital Status: Married  Human resources officer Violence: Not At Risk (09/26/2021)   Humiliation, Afraid, Rape, and Kick questionnaire    Fear of Current or Ex-Partner: No    Emotionally Abused: No    Physically Abused: No    Sexually Abused: No    Review of Systems:    Constitutional: No weight loss, fever or chills Cardiovascular: No chest pain Respiratory: No SOB  Gastrointestinal: See HPI and otherwise negative   Physical Exam:  Vital signs: BP 104/60   Pulse (!) 56   Ht '5\' 4"'$  (1.626 m)   Wt 144 lb 6.4 oz (65.5 kg)  LMP 07/22/1989 (Approximate)   BMI 24.79 kg/m    Constitutional:   Pleasant Elderly Caucasian female appears to be in NAD, Well developed, Well nourished, alert and cooperative Respiratory: Respirations even  and unlabored. Lungs clear to auscultation bilaterally.   No wheezes, crackles, or rhonchi.  Cardiovascular: Normal S1, S2. No MRG. Regular rate and rhythm. No peripheral edema, cyanosis or pallor.  Gastrointestinal:  Soft, nondistended, nontender. No rebound or guarding. Increased BS all four quadrants. No appreciable masses or hepatomegaly. Rectal:  Not performed.  Psychiatric: Oriented to person, place and time. Demonstrates good judgement and reason without abnormal affect or behaviors.  RELEVANT LABS AND IMAGING: CBC    Component Value Date/Time   WBC 5.8 01/31/2022 1647   RBC 4.12 01/31/2022 1647   HGB 12.9 01/31/2022 1647   HGB 12.9 09/18/2021 1519   HCT 38.3 01/31/2022 1647   PLT 210.0 01/31/2022 1647   PLT 185 09/18/2021 1519   MCV 93.0 01/31/2022 1647   MCH 30.7 11/24/2021 1154   MCHC 33.7 01/31/2022 1647   RDW 13.9 01/31/2022 1647   LYMPHSABS 1.4 01/31/2022 1647   MONOABS 0.9 01/31/2022 1647   EOSABS 0.2 01/31/2022 1647   BASOSABS 0.0 01/31/2022 1647    CMP     Component Value Date/Time   NA 139 01/31/2022 1647   NA 141 03/13/2020 1418   K 4.3 01/31/2022 1647   CL 104 01/31/2022 1647   CO2 32 01/31/2022 1647   GLUCOSE 97 01/31/2022 1647   BUN 26 (H) 01/31/2022 1647   BUN 16 03/13/2020 1418   CREATININE 0.81 01/31/2022 1647   CREATININE 0.70 09/18/2021 1519   CREATININE 0.76 12/29/2018 1526   CALCIUM 9.3 01/31/2022 1647   PROT 6.4 01/31/2022 1647   PROT 6.4 11/10/2019 1649   ALBUMIN 4.3 01/31/2022 1647   AST 16 01/31/2022 1647   AST 55 (H) 09/18/2021 1519   ALT 11 01/31/2022 1647   ALT 25 09/18/2021 1519   ALKPHOS 80 01/31/2022 1647   BILITOT 0.4 01/31/2022 1647   BILITOT 0.6 09/18/2021 1519   GFRNONAA >60 11/24/2021 1154   GFRNONAA >60 09/18/2021 1519   GFRNONAA 78 12/29/2018 1526   GFRAA 96 03/13/2020 1418   GFRAA 91 12/29/2018 1526    Assessment: 1.  Bloating and gas: Continues per patient but she is at her baseline which does not interfere  with her life; consider SIBO +/- IBS  Plan: 1.  Discussed with patient that if symptoms get worse she can always try the Xifaxan 550 3 times daily x2 weeks. 2.  Answered all the other patient's questions and spent time discussing her recent ERCP. 3.  Patient to follow in clinic with [place] as needed.  Ellouise Newer, PA-C [facility] 03/18/2022, 1:41 PM  Cc: Tower, Wynelle Fanny, MD

## 2022-03-18 NOTE — Patient Instructions (Signed)
Glad you are doing better! Please call if any of your symptoms change or if you end up trying your Xifaxan.  Sincerely, Ellouise Newer, PA-C

## 2022-03-28 DIAGNOSIS — D440 Neoplasm of uncertain behavior of thyroid gland: Secondary | ICD-10-CM | POA: Diagnosis not present

## 2022-03-28 DIAGNOSIS — E042 Nontoxic multinodular goiter: Secondary | ICD-10-CM | POA: Diagnosis not present

## 2022-03-28 DIAGNOSIS — Q842 Other congenital malformations of hair: Secondary | ICD-10-CM | POA: Diagnosis not present

## 2022-03-28 DIAGNOSIS — E061 Subacute thyroiditis: Secondary | ICD-10-CM | POA: Diagnosis not present

## 2022-03-28 DIAGNOSIS — E039 Hypothyroidism, unspecified: Secondary | ICD-10-CM | POA: Diagnosis not present

## 2022-03-28 DIAGNOSIS — N959 Unspecified menopausal and perimenopausal disorder: Secondary | ICD-10-CM | POA: Diagnosis not present

## 2022-04-02 DIAGNOSIS — E039 Hypothyroidism, unspecified: Secondary | ICD-10-CM | POA: Diagnosis not present

## 2022-04-02 DIAGNOSIS — E042 Nontoxic multinodular goiter: Secondary | ICD-10-CM | POA: Diagnosis not present

## 2022-04-02 DIAGNOSIS — E061 Subacute thyroiditis: Secondary | ICD-10-CM | POA: Diagnosis not present

## 2022-04-02 DIAGNOSIS — Q842 Other congenital malformations of hair: Secondary | ICD-10-CM | POA: Diagnosis not present

## 2022-04-05 ENCOUNTER — Telehealth: Payer: Self-pay | Admitting: Gastroenterology

## 2022-04-05 NOTE — Telephone Encounter (Signed)
Spoke with the patient.     01/31/2022 follow-up with Dr. Silverio Decamp at that time described that a week before she developed generalized abdominal cramping with abdominal bloating and excessive gas.  Noted that she had been treated before with Xifaxan for SIBO and had done well.  Labs were normal.  She was told to try Gas-X 1 capsule up to 3 times a day as needed and add BuSpar 5 mg at bedtime as well as start a lactose-free diet.  She was also given Rifaximin 550 3 times daily x2 weeks for SIBO. She had read the possible side effects on Xifaxan and decided not to take it.  Patient was seen by Ellouise Newer 03/18/22 for excessive gas and belching. At that time she reported feeling okay and is not extremely bothered by her symptoms. Opted again not to take Xifaxan. The patient calls today to let us know she did decide to start Xifaxan because of the persistence of excessive gas. Symptoms are not worse, "about the same over the past 2 or 3 years." She asks if she should treat her constipation with Miralax and should she eat yogurt. Discuss Miralax for her constipation PRN. Discussed eating yogurt and fermented foods in place of supplements.

## 2022-04-05 NOTE — Telephone Encounter (Signed)
Patient asked to speak with you regarding a medication she is taking. Please call to advise. Thank you

## 2022-04-09 ENCOUNTER — Ambulatory Visit: Payer: PPO | Admitting: Dermatology

## 2022-04-09 ENCOUNTER — Other Ambulatory Visit: Payer: Self-pay | Admitting: Dermatology

## 2022-04-09 DIAGNOSIS — L659 Nonscarring hair loss, unspecified: Secondary | ICD-10-CM | POA: Diagnosis not present

## 2022-04-09 DIAGNOSIS — L309 Dermatitis, unspecified: Secondary | ICD-10-CM | POA: Diagnosis not present

## 2022-04-09 DIAGNOSIS — L570 Actinic keratosis: Secondary | ICD-10-CM

## 2022-04-09 DIAGNOSIS — L408 Other psoriasis: Secondary | ICD-10-CM

## 2022-04-09 DIAGNOSIS — D492 Neoplasm of unspecified behavior of bone, soft tissue, and skin: Secondary | ICD-10-CM

## 2022-04-09 DIAGNOSIS — D485 Neoplasm of uncertain behavior of skin: Secondary | ICD-10-CM

## 2022-04-09 DIAGNOSIS — L578 Other skin changes due to chronic exposure to nonionizing radiation: Secondary | ICD-10-CM | POA: Diagnosis not present

## 2022-04-09 DIAGNOSIS — L820 Inflamed seborrheic keratosis: Secondary | ICD-10-CM

## 2022-04-09 DIAGNOSIS — D692 Other nonthrombocytopenic purpura: Secondary | ICD-10-CM

## 2022-04-09 DIAGNOSIS — L919 Hypertrophic disorder of the skin, unspecified: Secondary | ICD-10-CM | POA: Diagnosis not present

## 2022-04-09 MED ORDER — FINASTERIDE 5 MG PO TABS: 2.5000 mg | ORAL_TABLET | Freq: Every day | ORAL | 3 refills | Status: DC

## 2022-04-09 MED ORDER — TRIAMCINOLONE ACETONIDE 0.1 % EX OINT: TOPICAL_OINTMENT | CUTANEOUS | 0 refills | Status: DC

## 2022-04-09 NOTE — Patient Instructions (Addendum)
- Start 5-fluorouracil/calcipotriene cream twice a day for 4-7 days to affected areas including bilateral arms, tops of hands. Apply twice daily for 4-7 days to right eyebrow and right nasal tip until red and crusty, let heal and repeat in 1 month if still rough. Prescription sent to [facility]. Patient advised they will receive an email to purchase the medication online and have it sent to their home. Patient provided with handout reviewing treatment course and side effects and advised to call or message [place] on MyChart with any concerns.  Instructions for [facility] Medications  One or more of your medications was sent to the [facility] order [facility]. You will receive an email from them and can purchase the medicine through that link. It will then be mailed to your home at the address you confirmed. If for any reason you do not receive an email from them, please check your spam folder. If you still do not find the email, please let us know. [facility] phone number is [phone].  Recommend T-Sal shampoo alternating with ketoconazole 2% shampoo. Continue clobetasol 0.05% foam increasing to twice daily for up to 3-4 weeks, at ears for up to 2 weeks. Avoid applying to face, groin, and axilla. Use as directed. Long-term use can cause thinning of the skin.   Topical steroids (such as triamcinolone, fluocinolone, fluocinonide, mometasone, clobetasol, halobetasol, betamethasone, hydrocortisone) can cause thinning and lightening of the skin if they are used for too long in the same area. Your physician has selected the right strength medicine for your problem and area affected on the body. Please use your medication only as directed by your physician to prevent side effects.    Cryotherapy Aftercare  Wash gently with soap and water everyday.   Apply Vaseline and Band-Aid daily until healed.   - Start triamcinolone 0.1% oint twice daily for up to 2  weeks as needed for "cool down". Avoid applying to face, groin, and axilla. Use as directed. Long-term use can cause thinning of the skin.  Wound Care Instructions  Cleanse wound gently with soap and water once a day then pat dry with clean gauze. Apply a thin coat of Petrolatum (petroleum jelly, "Vaseline") over the wound (unless you have an allergy to this). We recommend that you use a new, sterile tube of Vaseline. Do not pick or remove scabs. Do not remove the yellow or white "healing tissue" from the base of the wound.  Cover the wound with fresh, clean, nonstick gauze and secure with paper tape. You may use Band-Aids in place of gauze and tape if the wound is small enough, but would recommend trimming much of the tape off as there is often too much. Sometimes Band-Aids can irritate the skin.  You should call the office for your biopsy report after 1 week if you have not already been contacted.  If you experience any problems, such as abnormal amounts of bleeding, swelling, significant bruising, significant pain, or evidence of infection, please call the office immediately.  FOR ADULT SURGERY PATIENTS: If you need something for pain relief you may take 1 extra strength Tylenol (acetaminophen) AND 2 Ibuprofen ('200mg'$  each) together every 4 hours as needed for pain. (do not take these if you are allergic to them or if you have a reason you should not take them.) Typically, you may only need pain medication for 1 to 3 days.   Recommend minoxidil 5% (Rogaine for men) solution or foam to be applied  to the scalp and left in. This should ideally be used twice daily for best results but it helps with hair regrowth when used at least three times per week. Rogaine initially can cause increased hair shedding for the first few weeks but this will stop with continued use. In studies, people who used minoxidil (Rogaine) for at least 6 months had thicker hair than people who did not. Minoxidil topical (Rogaine)  only works as long as it continues to be used. If if it is no longer used then the hair it has been helping to regrow can fall out. Minoxidil topical (Rogaine) can cause increased facial hair growth.  Due to recent changes in healthcare laws, you may see results of your pathology and/or laboratory studies on MyChart before the doctors have had a chance to review them. We understand that in some cases there may be results that are confusing or concerning to you. Please understand that not all results are received at the same time and often the doctors may need to interpret multiple results in order to provide you with the best plan of care or course of treatment. Therefore, we ask that you please give [place] 2 business days to thoroughly review all your results before contacting the office for clarification. Should we see a critical lab result, you will be contacted sooner.   If You Need Anything After Your Visit  If you have any questions or concerns for your doctor, please call our main line at [phone] and press option 4 to reach your doctor's medical assistant. If no one answers, please leave a voicemail as directed and we will return your call as soon as possible. Messages left after 4 pm will be answered the following business day.   You may also send [place] a message via [place]. We typically respond to MyChart messages within 1-2 business days.  For prescription refills, please ask your pharmacy to contact our office. Our fax number is [phone].  If you have an urgent issue when the clinic is closed that cannot wait until the next business day, you can page your doctor at the number below.    Please note that while we do our best to be available for urgent issues outside of office hours, we are not available 24/7.   If you have an urgent issue and are unable to reach [place], you may choose to seek medical care at your doctor's office, retail clinic, urgent care center, or emergency room.  If  you have a medical emergency, please immediately call 911 or go to the emergency department.  Pager Numbers  - Dr. Nehemiah Massed: [phone]  - Dr. Laurence Ferrari: [phone]  - Dr. Nicole Kindred: [phone]  In the event of inclement weather, please call our main line at [phone] for an update on the status of any delays or closures.  Dermatology Medication Tips: Please keep the boxes that topical medications come in in order to help keep track of the instructions about where and how to use these. Pharmacies typically print the medication instructions only on the boxes and not directly on the medication tubes.   If your medication is too expensive, please contact our office at [phone] option 4 or send [place] a message through [place].   We are unable to tell what your co-pay for medications will be in advance as this is different depending on your insurance coverage. However, we may be able to find a substitute medication at lower cost or fill out paperwork to get insurance  to cover a needed medication.   If a prior authorization is required to get your medication covered by your insurance company, please allow [place] 1-2 business days to complete this process.  Drug prices often vary depending on where the prescription is filled and some pharmacies may offer cheaper prices.  The website www.goodrx.com contains coupons for medications through different pharmacies. The prices here do not account for what the cost may be with help from insurance (it may be cheaper with your insurance), but the website can give you the price if you did not use any insurance.  - You can print the associated coupon and take it with your prescription to the pharmacy.  - You may also stop by our office during regular business hours and pick up a GoodRx coupon card.  - If you need your prescription sent electronically to a different pharmacy, notify our office through [facility] or by phone at [phone] option  4.     Si Usted Necesita Algo Despus de Su Visita  Tambin puede enviarnos un mensaje a travs de Pharmacist, community. Por lo general respondemos a los mensajes de MyChart en el transcurso de 1 a 2 das hbiles.  Para renovar recetas, por favor pida a su farmacia que se ponga en contacto con nuestra oficina. Harland Dingwall de fax es [place] [phone].  Si tiene un asunto urgente cuando la clnica est cerrada y que no puede esperar hasta el siguiente da hbil, puede llamar/localizar a su doctor(a) al nmero que aparece a continuacin.   Por favor, tenga en cuenta que aunque hacemos todo lo posible para estar disponibles para asuntos urgentes fuera del horario de [name], no estamos disponibles las 24 horas del da, los 7 das de la [place].   Si tiene un problema urgente y no puede comunicarse con nosotros, puede optar por buscar atencin mdica  en el consultorio de su doctor(a), en una clnica privada, en un centro de atencin urgente o en una sala de emergencias.  Si tiene Engineering geologist, por favor llame inmediatamente al 911 o vaya a la sala de emergencias.  Nmeros de bper  - Dr. Nehemiah Massed: [phone]  - Dra. Moye: [phone]  - Dra. Nicole Kindred: [phone]  En caso de inclemencias del [place], por favor llame a Johnsie Kindred principal al [phone] para una actualizacin sobre el [place] de cualquier retraso o cierre.  Consejos para la medicacin en dermatologa: Por favor, guarde las cajas en las que vienen los medicamentos de uso tpico para ayudarle a seguir las instrucciones sobre dnde y cmo usarlos. Las farmacias generalmente imprimen las instrucciones del medicamento slo en las cajas y no directamente en los tubos del [place].   Si su medicamento es muy caro, por favor, pngase en contacto con Zigmund Daniel llamando al [phone] y presione la opcin 4 o envenos un mensaje a travs de Pharmacist, community.   No podemos decirle cul ser su copago por los medicamentos por  adelantado ya que esto es diferente dependiendo de la cobertura de su seguro. Sin embargo, es posible que podamos encontrar un medicamento sustituto a Electrical engineer un formulario para que el seguro cubra el medicamento que se considera necesario.   Si se requiere una autorizacin previa para que su compaa de seguros [place] su medicamento, por favor permtanos de 1 a 2 das hbiles para completar este proceso.  Los precios de los medicamentos varan con frecuencia dependiendo del Environmental consultant de dnde se surte la receta y alguna farmacias pueden ofrecer precios  ms baratos.  El sitio web www.goodrx.com tiene cupones para medicamentos de Airline pilot. Los precios aqu no tienen en cuenta lo que podra costar con la ayuda del seguro (puede ser ms barato con su seguro), pero el sitio web puede darle el precio si no utiliz Research scientist (physical sciences).  - Puede imprimir el cupn correspondiente y llevarlo con su receta a la farmacia.  - Tambin puede pasar por nuestra oficina durante el horario de atencin regular y Charity fundraiser una tarjeta de cupones de GoodRx.  - Si necesita que su receta se enve electrnicamente a una farmacia diferente, informe a nuestra oficina a travs de MyChart de [place] o por telfono llamando al [phone] y presione la opcin 4.

## 2022-04-09 NOTE — Progress Notes (Signed)
Follow-Up Visit   Subjective  Morgan [name] is a 76 y.o. female who presents for the following: Skin Tag (Spot under left breast that is irritated and would like removed. ), Seborrheic Dermatitis (Spreading at scalp and ears. Using TMC 0.1% cream at ears, clobetasol 0.05% foam at scalp, HC 0.2% at face, not using ketoconazole shampoo because they have not worked. ), Actinic Keratosis (Patient has treated chest and some areas at face with 5FU/calcipotriene. Patient was scheduled for PDT to chest, arms and hands but cancelled appointments. ), and Skin Problem (Irritated spot at left forearm, one at left neck that she is treating with TMC 0.1% cream. ).   The following portions of the chart were reviewed this encounter and updated as appropriate:   Tobacco  Allergies  Meds  Problems  Med Hx  Surg Hx  Fam Hx      Review of Systems:  No other skin or systemic complaints except as noted in HPI or Assessment and Plan.  Objective  Well appearing patient in no apparent distress; mood and affect are within normal limits.  A focused examination was performed including arms, hands, face, scalp, chest. Relevant physical exam findings are noted in the Assessment and Plan.  left sup shoulder x 1, right clavicle x 1, mid forehead x 1 (3) Erythematous thin papules/macules with gritty scale.   Right Shoulder Scaly pink patch   Left Forearm x 1 Erythematous stuck-on, waxy papule or plaque  Left Inframammary 0.4 cm erythematous papule R/o irritated Skin Tag vs Nevus or other     Scalp Scaly pink plaques with moderately thick scale  Scalp Diffuse thinning of hair, negative hair pull test.   Arms, chest, face Assessment & Plan  AK (actinic keratosis) (3) left sup shoulder x 1, right clavicle x 1, mid forehead x 1  Actinic keratoses are precancerous spots that appear secondary to cumulative UV radiation exposure/sun exposure over time. They are chronic with expected duration over 1  year. A portion of actinic keratoses will progress to squamous cell carcinoma of the skin. It is not possible to reliably predict which spots will progress to skin cancer and so treatment is recommended to prevent development of skin cancer.  Recommend daily broad spectrum sunscreen SPF 30+ to sun-exposed areas, reapply every 2 hours as needed.  Recommend staying in the shade or wearing long sleeves, sun glasses (UVA+UVB protection) and wide brim hats (4-inch brim around the entire circumference of the hat). Call for new or changing lesions.   Prior to procedure, discussed risks of blister formation, small wound, skin dyspigmentation, or rare scar following cryotherapy. Recommend Vaseline ointment to treated areas while healing.   Destruction of lesion - left sup shoulder x 1, right clavicle x 1, mid forehead x 1  Destruction method: cryotherapy   Informed consent: discussed and consent obtained   Lesion destroyed using liquid nitrogen: Yes   Cryotherapy cycles:  2 Outcome: patient tolerated procedure well with no complications   Post-procedure details: wound care instructions given    Dermatitis Right Shoulder  Dermatitis favored.  Can use halobetasol to area twice a day for 2 weeks. Call if worsens or fails to resolve.  Recheck, consider cryotherapy or biopsy if not resolved  Inflamed seborrheic keratosis Left Forearm x 1  Symptomatic, irritating, patient would like treated.  Prior to procedure, discussed risks of blister formation, small wound, skin dyspigmentation, or rare scar following cryotherapy. Recommend Vaseline ointment to treated areas while healing.   Destruction  of lesion - Left Forearm x 1  Destruction method: cryotherapy   Informed consent: discussed and consent obtained   Lesion destroyed using liquid nitrogen: Yes   Cryotherapy cycles:  2 Outcome: patient tolerated procedure well with no complications   Post-procedure details: wound care instructions  given    Neoplasm of skin Left Inframammary  Epidermal / dermal shaving  Lesion diameter (cm):  0.4 Informed consent: discussed and consent obtained   Timeout: patient name, date of birth, surgical site, and procedure verified   Anesthesia: the lesion was anesthetized in a standard fashion   Anesthetic:  1% lidocaine w/ epinephrine 1-100,000 local infiltration Instrument used: flexible razor blade   Hemostasis achieved with: aluminum chloride   Outcome: patient tolerated procedure well   Post-procedure details: wound care instructions given   Additional details:  Mupirocin and a bandage applied  Specimen 1 - Surgical pathology Differential Diagnosis: R/o irritated Skin Tag vs Nevus or other  Check Margins: No 0.4 cm erythematous papule   Sebopsoriasis Scalp  No joint pain   Recommend T-Sal shampoo alternating with ketoconazole 2% shampoo. Continue clobetasol 0.05% foam increasing to twice daily for up to 3-4 weeks. Avoid applying to face, groin, and axilla. Use as directed. Long-term use can cause thinning of the skin.  Topical steroids (such as triamcinolone, fluocinolone, fluocinonide, mometasone, clobetasol, halobetasol, betamethasone, hydrocortisone) can cause thinning and lightening of the skin if they are used for too long in the same area. Your physician has selected the right strength medicine for your problem and area affected on the body. Please use your medication only as directed by your physician to prevent side effects.   Seborrheic Dermatitis  -  is a chronic persistent rash characterized by pinkness and scaling most commonly of the mid face but also can occur on the scalp (dandruff), ears; mid chest, mid back and groin.  It tends to be exacerbated by stress and cooler weather.  People who have neurologic disease may experience new onset or exacerbation of existing seborrheic dermatitis.  The condition is not curable but treatable and can be controlled.  Psoriasis  is a chronic non-curable, but treatable genetic/hereditary disease that may have other systemic features affecting other organ systems such as joints (Psoriatic Arthritis). It is associated with an increased risk of inflammatory bowel disease, heart disease, non-alcoholic fatty liver disease, and depression.      Alopecia Scalp  C/w androgenetic alopecia  Chronic and persistent condition with duration or expected duration over one year. Condition is bothersome/symptomatic for patient. Currently flared.  Recommend minoxidil 5% (Rogaine for men) solution or foam to be applied to the scalp and left in. This should ideally be used twice daily for best results but it helps with hair regrowth when used at least three times per week. Rogaine initially can cause increased hair shedding for the first few weeks but this will stop with continued use. In studies, people who used minoxidil (Rogaine) for at least 6 months had thicker hair than people who did not. Minoxidil topical (Rogaine) only works as long as it continues to be used. If if it is no longer used then the hair it has been helping to regrow can fall out. Minoxidil topical (Rogaine) can cause increased facial hair growth.  She is hesitant to start due to risk of side effects.  Start finasteride 2.5 mg once daily. Counseled that finasteride can decrease libido (sexual drive). Advised it should not be taken by pregnant women or women who could become  pregnant. Advised not to donate blood products while taking this medication. Advised if she stops using the medication, she may lose the hair the medication has been helping her grow.     Purpura - Chronic; persistent and recurrent.  Treatable, but not curable. - Violaceous macules and patches - Benign - Related to trauma, age, sun damage and/or use of blood thinners, chronic use of topical and/or oral steroids - Observe - Can use OTC arnica containing moisturizer such as Dermend Bruise Formula if  desired - Call for worsening or other concerns  Actinic Damage - Severe, confluent actinic changes with pre-cancerous actinic keratoses  - Severe, chronic, not at goal, secondary to cumulative UV radiation exposure over time - diffuse scaly erythematous macules and papules with underlying dyspigmentation - Discussed Prescription "Field Treatment" for Severe, Chronic Confluent Actinic Changes with Pre-Cancerous Actinic Keratoses Field treatment involves treatment of an entire area of skin that has confluent Actinic Changes (Sun/ Ultraviolet light damage) and PreCancerous Actinic Keratoses by method of PhotoDynamic Therapy (PDT) and/or prescription Topical Chemotherapy agents such as 5-fluorouracil, 5-fluorouracil/calcipotriene, and/or imiquimod.  The purpose is to decrease the number of clinically evident and subclinical PreCancerous lesions to prevent progression to development of skin cancer by chemically destroying early precancer changes that may or may not be visible.  It has been shown to reduce the risk of developing skin cancer in the treated area. As a result of treatment, redness, scaling, crusting, and open sores may occur during treatment course. One or more than one of these methods may be used and may have to be used several times to control, suppress and eliminate the PreCancerous changes. Discussed treatment course, expected reaction, and possible side effects. - Recommend daily broad spectrum sunscreen SPF 30+ to sun-exposed areas, reapply every 2 hours as needed.  - Staying in the shade or wearing long sleeves, sun glasses (UVA+UVB protection) and wide brim hats (4-inch brim around the entire circumference of the hat) are also recommended. - Call for new or changing lesions. - Start TMC 0.1% oint twice daily for up to 2 weeks as needed for "cool down". Avoid applying to face, groin, and axilla. Use as directed. Long-term use can cause thinning of the skin. - Start  5-fluorouracil/calcipotriene cream twice a day for 4-7 days to affected areas including bilateral arms, tops of hands. Apply twice daily for 4-7 days to right eyebrow and right nasal tip until red and crusty, let heal and repeat in 1 month if still rough. Prescription sent to [facility]. Patient advised they will receive an email to purchase the medication online and have it sent to their home. Patient provided with handout reviewing treatment course and side effects and advised to call or message [place] on MyChart with any concerns. After finishing treatment with 5FU/calcipotriene, can follow with Triamicnolone ointment to "cool down" the area twice a day for up to 2 weeks.   Return in about 2 months (around 06/09/2022).  Graciella Belton, RMA, am acting as scribe for Forest Gleason, MD .  Documentation: I have reviewed the above documentation for accuracy and completeness, and I agree with the above.  Forest Gleason, MD

## 2022-04-10 ENCOUNTER — Telehealth: Payer: Self-pay

## 2022-04-10 NOTE — Telephone Encounter (Signed)
Left results on patient's voicemail. C/B if any questions. JP

## 2022-04-10 NOTE — Telephone Encounter (Signed)
-----   Message from Alfonso Patten, MD sent at 04/10/2022  5:17 PM EDT ----- Skin , left inframammary FIBROEPITHELIAL POLYP -->   Skin tag, benign, no additional treatment needed.     MAs please call. Thank you!

## 2022-04-13 ENCOUNTER — Encounter: Payer: Self-pay | Admitting: Dermatology

## 2022-05-06 ENCOUNTER — Other Ambulatory Visit: Payer: Self-pay | Admitting: Dermatology

## 2022-05-21 DIAGNOSIS — Z1231 Encounter for screening mammogram for malignant neoplasm of breast: Secondary | ICD-10-CM | POA: Diagnosis not present

## 2022-05-21 LAB — HM MAMMOGRAPHY

## 2022-05-30 ENCOUNTER — Encounter: Payer: Self-pay | Admitting: Family Medicine

## 2022-06-03 DIAGNOSIS — M542 Cervicalgia: Secondary | ICD-10-CM | POA: Diagnosis not present

## 2022-06-12 ENCOUNTER — Ambulatory Visit: Payer: PPO | Admitting: Dermatology

## 2022-06-18 ENCOUNTER — Ambulatory Visit: Payer: PPO | Admitting: Dermatology

## 2022-06-19 ENCOUNTER — Ambulatory Visit: Payer: PPO | Admitting: Dermatology

## 2022-06-19 ENCOUNTER — Encounter: Payer: Self-pay | Admitting: Dermatology

## 2022-06-19 DIAGNOSIS — L578 Other skin changes due to chronic exposure to nonionizing radiation: Secondary | ICD-10-CM

## 2022-06-19 DIAGNOSIS — L649 Androgenic alopecia, unspecified: Secondary | ICD-10-CM | POA: Diagnosis not present

## 2022-06-19 DIAGNOSIS — L570 Actinic keratosis: Secondary | ICD-10-CM

## 2022-06-19 DIAGNOSIS — D692 Other nonthrombocytopenic purpura: Secondary | ICD-10-CM

## 2022-06-19 DIAGNOSIS — L219 Seborrheic dermatitis, unspecified: Secondary | ICD-10-CM

## 2022-06-19 MED ORDER — DUTASTERIDE 0.5 MG PO CAPS: 0.5000 mg | ORAL_CAPSULE | Freq: Every day | ORAL | 5 refills | Status: DC

## 2022-06-19 MED ORDER — HYDROCORTISONE 2.5 % EX CREA: TOPICAL_CREAM | CUTANEOUS | 1 refills | Status: DC

## 2022-06-19 NOTE — Patient Instructions (Addendum)
For Bruising On Arms: Can use OTC arnica containing moisturizer such as Dermend Bruise Formula if desired   - Re-Start 5-fluorouracil/calcipotriene cream twice a day for 7 days to affected areas including arms and hands twice daily to left jaw and forehead for 4 days.   Recommend 2 rounds of treatment before next visit.  Hair Loss: Start Dutasteride 0.'5mg'$  once daily  Do not take Finasteride.   Start Hydrocortisone 2.5% cream twice daily to crease of nose up to 2 weeks as needed.   Topical steroids (such as triamcinolone, fluocinolone, fluocinonide, mometasone, clobetasol, halobetasol, betamethasone, hydrocortisone) can cause thinning and lightening of the skin if they are used for too long in the same area. Your physician has selected the right strength medicine for your problem and area affected on the body. Please use your medication only as directed by your physician to prevent side effects.     Due to recent changes in healthcare laws, you may see results of your pathology and/or laboratory studies on MyChart before the doctors have had a chance to review them. We understand that in some cases there may be results that are confusing or concerning to you. Please understand that not all results are received at the same time and often the doctors may need to interpret multiple results in order to provide you with the best plan of care or course of treatment. Therefore, we ask that you please give [place] 2 business days to thoroughly review all your results before contacting the office for clarification. Should we see a critical lab result, you will be contacted sooner.   If You Need Anything After Your Visit  If you have any questions or concerns for your doctor, please call our main line at [phone] and press option 4 to reach your doctor's medical assistant. If no one answers, please leave a voicemail as directed and we will return your call as soon as possible. Messages left after 4 pm  will be answered the following business day.   You may also send [place] a message via [place]. We typically respond to MyChart messages within 1-2 business days.  For prescription refills, please ask your pharmacy to contact our office. Our fax number is [phone].  If you have an urgent issue when the clinic is closed that cannot wait until the next business day, you can page your doctor at the number below.    Please note that while we do our best to be available for urgent issues outside of office hours, we are not available 24/7.   If you have an urgent issue and are unable to reach [place], you may choose to seek medical care at your doctor's office, retail clinic, urgent care center, or emergency room.  If you have a medical emergency, please immediately call 911 or go to the emergency department.  Pager Numbers  - Dr. Nehemiah Massed: [phone]  - Dr. Laurence Ferrari: [phone]  - Dr. Nicole Kindred: [phone]  In the event of inclement weather, please call our main line at [phone] for an update on the status of any delays or closures.  Dermatology Medication Tips: Please keep the boxes that topical medications come in in order to help keep track of the instructions about where and how to use these. Pharmacies typically print the medication instructions only on the boxes and not directly on the medication tubes.   If your medication is too expensive, please contact our office at [phone] option 4 or send [place] a message through [place].  We are unable to tell what your co-pay for medications will be in advance as this is different depending on your insurance coverage. However, we may be able to find a substitute medication at lower cost or fill out paperwork to get insurance to cover a needed medication.   If a prior authorization is required to get your medication covered by your insurance company, please allow [place] 1-2 business days to complete this process.  Drug prices often vary depending  on where the prescription is filled and some pharmacies may offer cheaper prices.  The website www.goodrx.com contains coupons for medications through different pharmacies. The prices here do not account for what the cost may be with help from insurance (it may be cheaper with your insurance), but the website can give you the price if you did not use any insurance.  - You can print the associated coupon and take it with your prescription to the pharmacy.  - You may also stop by our office during regular business hours and pick up a GoodRx coupon card.  - If you need your prescription sent electronically to a different pharmacy, notify our office through [facility] or by phone at [phone] option 4.     Si Usted Necesita Algo Despus de Su Visita  Tambin puede enviarnos un mensaje a travs de Pharmacist, community. Por lo general respondemos a los mensajes de MyChart en el transcurso de 1 a 2 das hbiles.  Para renovar recetas, por favor pida a su farmacia que se ponga en contacto con nuestra oficina. Harland Dingwall de fax es [place] [phone].  Si tiene un asunto urgente cuando la clnica est cerrada y que no puede esperar hasta el siguiente da hbil, puede llamar/localizar a su doctor(a) al nmero que aparece a continuacin.   Por favor, tenga en cuenta que aunque hacemos todo lo posible para estar disponibles para asuntos urgentes fuera del horario de [place], no estamos disponibles las 24 horas del da, los 7 das de la [place].   Si tiene un problema urgente y no puede comunicarse con nosotros, puede optar por buscar atencin mdica  en el consultorio de su doctor(a), en una clnica privada, en un centro de atencin urgente o en una sala de emergencias.  Si tiene Engineering geologist, por favor llame inmediatamente al 911 o vaya a la sala de emergencias.  Nmeros de bper  - Dr. Nehemiah Massed: [phone]  - Dra. Moye: [phone]  - Dra. Nicole Kindred: [phone]  En caso de  inclemencias del [place], por favor llame a Johnsie Kindred principal al [phone] para una actualizacin sobre el [place] de cualquier retraso o cierre.  Consejos para la medicacin en dermatologa: Por favor, guarde las cajas en las que vienen los medicamentos de uso tpico para ayudarle a seguir las instrucciones sobre dnde y cmo usarlos. Las farmacias generalmente imprimen las instrucciones del medicamento slo en las cajas y no directamente en los tubos del [place].   Si su medicamento es muy caro, por favor, pngase en contacto con Zigmund Daniel llamando al [phone] y presione la opcin 4 o envenos un mensaje a travs de Pharmacist, community.   No podemos decirle cul ser su copago por los medicamentos por adelantado ya que esto es diferente dependiendo de la cobertura de su seguro. Sin embargo, es posible que podamos encontrar un medicamento sustituto a Electrical engineer un formulario para que el seguro cubra el medicamento que se considera necesario.   Si se requiere una autorizacin previa para que su  compaa de seguros [place] su medicamento, por favor permtanos de 1 a 2 das hbiles para completar este proceso.  Los precios de los medicamentos varan con frecuencia dependiendo del Environmental consultant de dnde se surte la receta y alguna farmacias pueden ofrecer precios ms baratos.  El sitio web www.goodrx.com tiene cupones para medicamentos de Airline pilot. Los precios aqu no tienen en cuenta lo que podra costar con la ayuda del seguro (puede ser ms barato con su seguro), pero el sitio web puede darle el precio si no utiliz Research scientist (physical sciences).  - Puede imprimir el cupn correspondiente y llevarlo con su receta a la farmacia.  - Tambin puede pasar por nuestra oficina durante el horario de atencin regular y Charity fundraiser una tarjeta de cupones de GoodRx.  - Si necesita que su receta se enve electrnicamente a una farmacia diferente, informe a nuestra oficina a travs de MyChart de [place] o  por telfono llamando al [phone] y presione la opcin 4.

## 2022-06-19 NOTE — Progress Notes (Signed)
Follow-Up Visit   Subjective  Morgan [name] is a 76 y.o. female who presents for the following: Actinic Keratosis (2 month follow up. Hands and arms Tx with 5FU/Calcipotriene since last visit. Was unable to treat hands as well due to hands being in water a lot. Left sup shoulder, right clavicle and mid forehead Tx with LN2 at last visit).  She is bothered by persistent hair loss. She did not start finasteride due to concerns re potential side effects.   The patient has spots, moles and lesions to be evaluated, some may be new or changing and the patient has concerns that these could be cancer.   The following portions of the chart were reviewed this encounter and updated as appropriate:  Tobacco  Allergies  Meds  Problems  Med Hx  Surg Hx  Fam Hx      Review of Systems: No other skin or systemic complaints except as noted in HPI or Assessment and Plan.   Objective  Well appearing patient in no apparent distress; mood and affect are within normal limits.  A focused examination was performed including face, arms, hands, right leg. Relevant physical exam findings are noted in the Assessment and Plan.  Left Alar Crease Pink patches with greasy scale.   Scalp Diffuse thinning of the crown and widening of the midline part with retention of the frontal hairline - Reviewed progressive nature and prognosis.    Assessment & Plan   Purpura - Chronic; persistent and recurrent.  Treatable, but not curable. - Violaceous macules and patches - Benign - Related to trauma, age, sun damage and/or use of blood thinners, chronic use of topical and/or oral steroids - Observe - Can use OTC arnica containing moisturizer such as Dermend Bruise Formula if desired - Call for worsening or other concerns  Actinic Damage with PreCancerous Actinic Keratoses Counseling for Topical Chemotherapy Management: Patient exhibits: - Severe, confluent actinic changes with pre-cancerous actinic keratoses  that is secondary to cumulative UV radiation exposure over time - Condition that is severe; chronic, not at goal. - diffuse scaly erythematous macules and papules with underlying dyspigmentation - Discussed Prescription "Field Treatment" topical Chemotherapy for Severe, Chronic Confluent Actinic Changes with Pre-Cancerous Actinic Keratoses Field treatment involves treatment of an entire area of skin that has confluent Actinic Changes (Sun/ Ultraviolet light damage) and PreCancerous Actinic Keratoses by method of PhotoDynamic Therapy (PDT) and/or prescription Topical Chemotherapy agents such as 5-fluorouracil, 5-fluorouracil/calcipotriene, and/or imiquimod.  The purpose is to decrease the number of clinically evident and subclinical PreCancerous lesions to prevent progression to development of skin cancer by chemically destroying early precancer changes that may or may not be visible.  It has been shown to reduce the risk of developing skin cancer in the treated area. As a result of treatment, redness, scaling, crusting, and open sores may occur during treatment course. One or more than one of these methods may be used and may have to be used several times to control, suppress and eliminate the PreCancerous changes. Discussed treatment course, expected reaction, and possible side effects. - Recommend daily broad spectrum sunscreen SPF 30+ to sun-exposed areas, reapply every 2 hours as needed.  - Staying in the shade or wearing long sleeves, sun glasses (UVA+UVB protection) and wide brim hats (4-inch brim around the entire circumference of the hat) are also recommended. - Call for new or changing lesions.  - Re-Start 5-fluorouracil/calcipotriene cream twice a day for 7 days to affected areas including arms and hands (avoid  hand washing for 5 minutes after application to hands), twice daily to left jaw and forehead for 4 days.    Reviewed course of treatment and expected reaction.  Patient advised to expect  inflammation and crusting and advised that erosions are possible.  Patient advised to be diligent with sun protection during and after treatment. Counseled to keep medication out of reach of children and pets.   Seborrheic dermatitis Left Alar Crease  Chronic and persistent condition with duration or expected duration over one year. Condition is bothersome/symptomatic for patient. Currently flared.  Start ketoconazole shampoo to wash area, leave on 5-10 minutes.  Start Hydrocortisone 2.5% cream twice daily to crease of nose up to 2 weeks as needed.   Topical steroids (such as triamcinolone, fluocinolone, fluocinonide, mometasone, clobetasol, halobetasol, betamethasone, hydrocortisone) can cause thinning and lightening of the skin if they are used for too long in the same area. Your physician has selected the right strength medicine for your problem and area affected on the body. Please use your medication only as directed by your physician to prevent side effects.    hydrocortisone 2.5 % cream - Left Alar Crease twice daily to crease of nose up to 2 weeks as needed.  Related Medications clobetasol (OLUX) 0.05 % topical foam Apply topically 2 (two) times daily.  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 Condition is symptomatic/ bothersome to patient. Not currently at goal.  Female Androgenic Alopecia is a chronic condition related to genetics and/or hormonal changes.  In women androgenetic alopecia is commonly associated with menopause but may occur any time after puberty.  It causes hair thinning primarily on the crown with widening of the part and temporal hairline recession.  Can use OTC Rogaine (minoxidil) 5% solution/foam as directed.  Oral treatments in female patients who have no contraindication may include : - Low dose oral minoxidil 1.25 - '5mg'$  daily - Spironolactone 50 - '100mg'$  bid - Finasteride 2.5 - 5 mg  daily Adjunctive therapies include: - Low Level Laser Light Therapy (LLLT) - Platelet-rich plasma injections (PRP) - Hair Transplants or scalp reduction  Start Dutasteride 0.'5mg'$  once daily. Counseled on risk of decreased libido.  dutasteride (AVODART) 0.5 MG capsule - Scalp Take 1 capsule (0.5 mg total) by mouth daily.   Return in about 4 months (around 10/18/2022) for Alopecia Follow Up, [place] Follow Up.  I, Emelia Salisbury, CMA, am acting as scribe for Forest Gleason, MD.  Documentation: I have reviewed the above documentation for accuracy and completeness, and I agree with the above.  Forest Gleason, MD

## 2022-07-01 DIAGNOSIS — M542 Cervicalgia: Secondary | ICD-10-CM | POA: Diagnosis not present

## 2022-08-03 ENCOUNTER — Other Ambulatory Visit: Payer: Self-pay | Admitting: Dermatology

## 2022-08-03 DIAGNOSIS — L219 Seborrheic dermatitis, unspecified: Secondary | ICD-10-CM

## 2022-08-23 DIAGNOSIS — M2042 Other hammer toe(s) (acquired), left foot: Secondary | ICD-10-CM | POA: Diagnosis not present

## 2022-08-23 DIAGNOSIS — G629 Polyneuropathy, unspecified: Secondary | ICD-10-CM | POA: Diagnosis not present

## 2022-08-23 DIAGNOSIS — M79672 Pain in left foot: Secondary | ICD-10-CM | POA: Diagnosis not present

## 2022-08-23 DIAGNOSIS — M2041 Other hammer toe(s) (acquired), right foot: Secondary | ICD-10-CM | POA: Diagnosis not present

## 2022-08-23 DIAGNOSIS — G8929 Other chronic pain: Secondary | ICD-10-CM | POA: Diagnosis not present

## 2022-08-23 DIAGNOSIS — M216X1 Other acquired deformities of right foot: Secondary | ICD-10-CM | POA: Diagnosis not present

## 2022-08-23 DIAGNOSIS — M545 Low back pain, unspecified: Secondary | ICD-10-CM | POA: Diagnosis not present

## 2022-08-23 DIAGNOSIS — M216X2 Other acquired deformities of left foot: Secondary | ICD-10-CM | POA: Diagnosis not present

## 2022-08-23 DIAGNOSIS — Q6671 Congenital pes cavus, right foot: Secondary | ICD-10-CM | POA: Diagnosis not present

## 2022-08-23 DIAGNOSIS — Q6672 Congenital pes cavus, left foot: Secondary | ICD-10-CM | POA: Diagnosis not present

## 2022-08-27 ENCOUNTER — Encounter: Payer: Self-pay | Admitting: Physician Assistant

## 2022-08-27 ENCOUNTER — Ambulatory Visit: Payer: PPO | Admitting: Physician Assistant

## 2022-08-27 ENCOUNTER — Other Ambulatory Visit: Payer: PPO

## 2022-08-27 VITALS — BP 130/80 | HR 56 | Ht 64.0 in | Wt 148.5 lb

## 2022-08-27 DIAGNOSIS — R142 Eructation: Secondary | ICD-10-CM

## 2022-08-27 DIAGNOSIS — R140 Abdominal distension (gaseous): Secondary | ICD-10-CM

## 2022-08-27 DIAGNOSIS — R1013 Epigastric pain: Secondary | ICD-10-CM

## 2022-08-27 NOTE — Progress Notes (Signed)
Chief Complaint: Abdominal pain, SIBO and gas  HPI:    Morgan [name] is a 77 year old Caucasian female, known to Dr. Silverio Decamp, who presents to clinic today for follow-up of abdominal pain, SIBO and gas.    9/2 08/2018 colonoscopy with mild diverticulosis in the sigmoid and descending colon and nonbleeding internal hemorrhoids.  Repeat recommended in 5 years for surveillance.    12/18/2021 patient seen in clinic by me for severe abdominal pain and gas    12/21/2021 CT angio abdomen and pelvis with and without contrast for epigastric pain and increased gas with question of mesenteric ischemia.  No significant abnormality of the mesenteric arteries or veins but filling defects in the distal common bile duct.    01/09/2022 MRCP with and without contrast with 2 stones in the distal common bile duct with intra and extrahepatic biliary ductal dilation, status postcholecystectomy.    01/21/2022 ERCP for choledocholithiasis with complete removal accomplished by biliary sphincterotomy and balloon extraction.    01/31/2022 follow-up with Dr. Silverio Decamp at that time described that a week before she developed generalized abdominal cramping with abdominal bloating and excessive gas.  Noted that she had been treated before with Xifaxan for SIBO and had done well.  Labs were normal.  She was told to try Gas-X 1 capsule up to 3 times a day as needed and add BuSpar 5 mg at bedtime as well as start a lactose-free diet.  She was also given Rifaximin 550 3 times daily x2 weeks for SIBO.    03/18/2022 patient seen in clinic by me and at that time all of her symptoms were back including eructations and stomach grumbling.  She never did take the Xifaxan after reading that C. difficile was a possible side effect.  At that point discussed that if her symptoms got worse she could try the Xifaxan 550 3 times daily x 2 weeks.    04/05/2022 patient called and decided to start the Xifaxan due to persistent excessive gas.  Also would start MiraLAX  for constipation.    Today, the patient returns to clinic and tells me that she continues with gas.  She has had no more gurgling or rolling in her stomach since using the Xifaxan back in September, but now is having more trouble with eructations that just seem to come out at any point after eating.  She still has gas in her stomach, it is not necessarily painful but more so "annoying".  Also describes occasionally an upper abdominal "rolling" sensation in her stomach that is sometimes painful which she thinks may also be related to gas.  Tells me she does not use the Pepcid that is listed on her printout today, in fact she does not really use anything for gas anymore because she has tried everything and "nothing helped".    Denies fever, chills, weight loss or change in bowel habits.  Past Medical History:  Diagnosis Date   Allergy    Bradycardia    Collagen vascular disease ([place])    Connective tissue disease ([place])    COVID-19    DDD (degenerative disc disease), cervical    DDD (degenerative disc disease), lumbar    Endometrial polyp    not resolved with D&C (following)   GERD (gastroesophageal reflux disease)    gas/belching issues-was on protonix for short time-not since 6 months   HLD (hyperlipidemia)    Mixed connective tissue disease ([place]) diagnosed 5 years ago   no problems since diagnosis   Multiple thyroid  nodules 01/24/2014   Obesity    Osteoarthritis of cervical spine    Renal calculi    Spinal stenosis     Past Surgical History:  Procedure Laterality Date   APPENDECTOMY     CHOLECYSTECTOMY N/A 08/22/2021   Procedure: LAPAROSCOPIC CHOLECYSTECTOMY;  Surgeon: Coralie Keens, MD;  Location: [facility] [facility];  Service: General;  Laterality: N/A;   COLONOSCOPY     DILATION AND CURETTAGE OF UTERUS     fibroids   DILATION AND CURETTAGE OF UTERUS  01/2005   endometrial polyp   ENDOMETRIAL BIOPSY  2008   fibroid   ENDOSCOPIC RETROGRADE CHOLANGIOPANCREATOGRAPHY (ERCP)  WITH PROPOFOL N/A 01/21/2022   Procedure: ENDOSCOPIC RETROGRADE CHOLANGIOPANCREATOGRAPHY (ERCP) WITH PROPOFOL;  Surgeon: Gatha Mayer, MD;  Location: [facility];  Service: Gastroenterology;  Laterality: N/A;   HAND SURGERY     carpal tunnel release bilateral Dr. Daylene Katayama   hysteroscopic resection      polyps of PMB   REMOVAL OF STONES  01/21/2022   Procedure: REMOVAL OF STONES;  Surgeon: Gatha Mayer, MD;  Location: [place];  Service: Gastroenterology;;   Morgan [name]  01/21/2022   Procedure: Morgan [name];  Surgeon: Gatha Mayer, MD;  Location: [facility] ENDOSCOPY;  Service: Gastroenterology;;   TONSILLECTOMY     TUBAL LIGATION     1973   UPPER GASTROINTESTINAL ENDOSCOPY      Current Outpatient Medications  Medication Sig Dispense Refill   busPIRone (BUSPAR) 5 MG tablet Take 1 tablet (5 mg total) by mouth at bedtime. 30 tablet 1   Cholecalciferol (VITAMIN D3) 25 MCG (1000 UT) CAPS Take 2,000 Units by mouth daily in the afternoon.     clobetasol (OLUX) 0.05 % topical foam Apply topically 2 (two) times daily. (Patient taking differently: Apply 1 Application topically daily as needed (Dermatitis).) 50 g 5   Cyanocobalamin (VITAMIN B12) 1000 MCG TBCR Take 1,000 mcg by mouth every Monday, Wednesday, and Friday.     dutasteride (AVODART) 0.5 MG capsule Take 1 capsule (0.5 mg total) by mouth daily. 30 capsule 5   famotidine (PEPCID) 20 MG tablet Take 1 tablet (20 mg total) by mouth at bedtime. 30 tablet 2   finasteride (PROSCAR) 5 MG tablet Take 0.5 tablets (2.5 mg total) by mouth daily. 15 tablet 3   halobetasol (ULTRAVATE) 0.05 % cream APPLY TOPICALLY 2 TIMES DAILY AS NEEDED FOR UP TO 14 DAYS. AVOID FACE, GROIN ANDAXILLA (Patient taking differently: Apply 1 Application topically daily as needed (out break on skin).) 30 g 0   hydrocortisone 2.5 % cream twice daily to crease of nose up to 2 weeks as needed. 20 g 1   hydrocortisone valerate cream (WESTCORT) 0.2 % APPLY TOPICALLY TWICE DAILY  15 g 2   Hyoscyamine Sulfate SL (LEVSIN/SL) 0.125 MG SUBL Place 1 tablet under the tongue daily as needed. (Patient taking differently: Place 1 tablet under the tongue daily as needed (Stomach issues).) 30 tablet 1   ketoconazole (NIZORAL) 2 % shampoo SHAMPOO INTO THE SCALP. LET SIT 5-10 MINUTES THEN WASH OUT. USE 3 DAYS PER WEEK 120 mL 3   levothyroxine (SYNTHROID) 25 MCG tablet Take 25 mcg by mouth daily before breakfast.     polyethylene glycol powder (GLYCOLAX/MIRALAX) 17 GM/SCOOP powder Take 17 g by mouth as needed for mild constipation or moderate constipation.     traMADol (ULTRAM) 50 MG tablet Take 1-2 tablets (50-100 mg total) by mouth every 6 (six) hours as needed. (Patient taking differently: Take 50-100 mg by  mouth every 6 (six) hours as needed for severe pain or moderate pain.) 20 tablet 0   triamcinolone cream (KENALOG) 0.1 % Apply 1 Application topically daily as needed (out- break on skin).     triamcinolone ointment (KENALOG) 0.1 % APPLY TWICE DAILY FOR UP TO 2 WEEKS FOR "COOL DOWN". AVOID APPLYING TO FACE, GROIN, AND AXILLA. USE AS DIRECTED. LONGTERM USE CAN CAUSE THINNING OF THE SKIN 80 g 0   valACYclovir (VALTREX) 1000 MG tablet TAKE 2 TABLET BY MOUTH AT FIRST ONSET OFSYMPTOMS OF COLD SORES, THEN TAKE 2 TABLETS BY MOUTH 12 HOURS LATER (Patient taking differently: Take 1,000 mg by mouth See admin instructions. TAKE 2 TABLET BY MOUTH AT FIRST ONSET OFSYMPTOMS OF COLD SORES, THEN TAKE 2 TABLETS BY MOUTH 12 HOURS LATER as needed) 30 tablet 10   No current facility-administered medications for this visit.    Allergies as of 08/27/2022 - Review Complete 08/27/2022  Allergen Reaction Noted   Codeine Nausea And Vomiting 01/19/2007   Hydrocodone-acetaminophen  01/19/2007   Penicillins Hives 01/19/2007    Family History  Problem Relation Age of Onset   Dementia Mother    Coronary artery disease Father    Heart attack Father        deceased at 69   Diabetes Maternal Grandmother     Goiter Maternal Grandmother    Cancer Grandchild        rare type (dying at age 66)   Diabetes Cousin        multiple   Colon cancer Neg Hx    Esophageal cancer Neg Hx    Stomach cancer Neg Hx    Rectal cancer Neg Hx    Colon polyps Neg Hx     Social History   Socioeconomic History   Marital status: Married    Spouse name: Not on file   Number of children: 4   Years of education: HS   Highest education level: Not on file  Occupational History   Occupation: Retired   Tobacco Use   Smoking status: Former    Types: Cigarettes    Quit date: 07/22/1965    Years since quitting: 8.1   Smokeless tobacco: Never   Tobacco comments:    Smoked for 2 years as a teen  Scientific laboratory technician Use: Never used  Substance and Sexual Activity   Alcohol use: No   Drug use: No   Sexual activity: Yes    Birth control/protection: Surgical    Comment: BTL  Other Topics Concern   Not on file  Social History Narrative   + caffeine use     Social Determinants of Health   Financial Resource Strain: Low Risk  (09/26/2021)   Overall Financial Resource Strain (CARDIA)    Difficulty of Paying Living Expenses: Not hard at all  Food Insecurity: No Food Insecurity (09/26/2021)   Hunger Vital Sign    Worried About Running Out of Food in the Last Year: Never true    Ran Out of Food in the Last Year: Never true  Transportation Needs: No Transportation Needs (09/26/2021)   PRAPARE - Hydrologist (Medical): No    Lack of Transportation (Non-Medical): No  Physical Activity: Inactive (09/26/2021)   Exercise Vital Sign    Days of Exercise per Week: 0 days    Minutes of Exercise per Session: 0 min  Stress: No Stress Concern Present (09/26/2021)   [place]  Questionnaire    Feeling of Stress : Only a little  Social Connections: Moderately Integrated (09/26/2021)   Social Connection and Isolation Panel [NHANES]    Frequency of  Communication with Friends and Family: More than three times a week    Frequency of Social Gatherings with Friends and Family: More than three times a week    Attends Religious Services: 1 to 4 times per year    Active Member of [employer] or Organizations: No    Attends Archivist Meetings: Never    Marital Status: Married  Human resources officer Violence: Not At Risk (09/26/2021)   Humiliation, Afraid, Rape, and Kick questionnaire    Fear of Current or Ex-Partner: No    Emotionally Abused: No    Physically Abused: No    Sexually Abused: No    Review of Systems:    Constitutional: No weight loss, fever or chills Cardiovascular: No chest pain Respiratory: No SOB  Gastrointestinal: See HPI and otherwise negative   Physical Exam:  Vital signs: BP 130/80 (BP Location: Left Arm, Patient Position: Sitting, Cuff Size: Normal)   Pulse (!) 56   Ht '5\' 4"'$  (1.626 m)   Wt 148 lb 8 oz (67.4 kg)   LMP 07/22/1989 (Approximate)   BMI 25.49 kg/m    Constitutional:   Pleasant Elderly Caucasian female appears to be in NAD, Well developed, Well nourished, alert and cooperative Respiratory: Respirations even and unlabored. Lungs clear to auscultation bilaterally.   No wheezes, crackles, or rhonchi.  Cardiovascular: Normal S1, S2. No MRG. Regular rate and rhythm. No peripheral edema, cyanosis or pallor.  Gastrointestinal:  Soft, nondistended, nontender. No rebound or guarding. Normal bowel sounds. No appreciable masses or hepatomegaly. Rectal:  Not performed.  Psychiatric: Oriented to person, place and time. Demonstrates good judgement and reason without abnormal affect or behaviors.  RELEVANT LABS AND IMAGING: CBC    Component Value Date/Time   WBC 5.8 01/31/2022 1647   RBC 4.12 01/31/2022 1647   HGB 12.9 01/31/2022 1647   HGB 12.9 09/18/2021 1519   HCT 38.3 01/31/2022 1647   PLT 210.0 01/31/2022 1647   PLT 185 09/18/2021 1519   MCV 93.0 01/31/2022 1647   MCH 30.7 11/24/2021 1154   MCHC  33.7 01/31/2022 1647   RDW 13.9 01/31/2022 1647   LYMPHSABS 1.4 01/31/2022 1647   MONOABS 0.9 01/31/2022 1647   EOSABS 0.2 01/31/2022 1647   BASOSABS 0.0 01/31/2022 1647    CMP     Component Value Date/Time   NA 139 01/31/2022 1647   NA 141 03/13/2020 1418   K 4.3 01/31/2022 1647   CL 104 01/31/2022 1647   CO2 32 01/31/2022 1647   GLUCOSE 97 01/31/2022 1647   BUN 26 (H) 01/31/2022 1647   BUN 16 03/13/2020 1418   CREATININE 0.81 01/31/2022 1647   CREATININE 0.70 09/18/2021 1519   CREATININE 0.76 12/29/2018 1526   CALCIUM 9.3 01/31/2022 1647   PROT 6.4 01/31/2022 1647   PROT 6.4 11/10/2019 1649   ALBUMIN 4.3 01/31/2022 1647   AST 16 01/31/2022 1647   AST 55 (H) 09/18/2021 1519   ALT 11 01/31/2022 1647   ALT 25 09/18/2021 1519   ALKPHOS 80 01/31/2022 1647   BILITOT 0.4 01/31/2022 1647   BILITOT 0.6 09/18/2021 1519   GFRNONAA >60 11/24/2021 1154   GFRNONAA >60 09/18/2021 1519   GFRNONAA 78 12/29/2018 1526   GFRAA 96 03/13/2020 1418   GFRAA 91 12/29/2018 1526    Assessment: 1.  Eructation/gas/bloating:  Continues, chronic for the patient, seems worse lately with effortless eructations at any point after eating, does not matter what; consider H. pylori versus other 2.  SIBO: Found via breath test and treated with Xifaxan in September, no further rolling of gas in her stomach, but continues with eructations as above  Plan: 1.  Will test for H. pylori with a fecal antigen.  Discussed with patient that this is the last thing that I know to test for that could be giving her excessive gas/eructations, though I would suspect it to come with other symptoms as well.  If this is negative I am not sure there is much else I can offer her. 2.  Patient to follow in clinic per recommendations after above.  Did offer her a follow-up appointment with Dr. Silverio Decamp but she declined today.  Ellouise Newer, PA-C [facility] 08/27/2022, 2:31 PM  Cc: Tower, Wynelle Fanny, MD

## 2022-08-27 NOTE — Patient Instructions (Signed)
Your provider has requested that you go to the basement level for lab work before leaving today. Press "B" on the elevator. The lab is located at the first door on the left as you exit the elevator.   If your blood pressure at your visit was 140/90 or greater, please contact your primary care physician to follow up on this.  _______________________________________________________  If you are age 76 or older, your body mass index should be between 23-30. Your Body mass index is 25.49 kg/m. If this is out of the aforementioned range listed, please consider follow up with your Primary Care Provider.  If you are age 1 or younger, your body mass index should be between 19-25. Your Body mass index is 25.49 kg/m. If this is out of the aformentioned range listed, please consider follow up with your Primary Care Provider.   ________________________________________________________  The [facility] providers would like to encourage you to use [facility] to communicate with providers for non-urgent requests or questions.  Due to long hold times on the telephone, sending your provider a message by [facility] may be a faster and more efficient way to get a response.  Please allow 48 business hours for a response.  Please remember that this is for non-urgent requests.   Due to recent changes in healthcare laws, you may see the results of your imaging and laboratory studies on MyChart before your provider has had a chance to review them.  We understand that in some cases there may be results that are confusing or concerning to you. Not all laboratory results come back in the same time frame and the provider may be waiting for multiple results in order to interpret others.  Please give [place] 48 hours in order for your provider to thoroughly review all the results before contacting the office for clarification of your results.    Thank you for entrusting me with your care and choosing [facility].  Ellouise Newer  PA-C

## 2022-08-29 ENCOUNTER — Other Ambulatory Visit: Payer: Self-pay | Admitting: Radiology

## 2022-08-29 DIAGNOSIS — R1013 Epigastric pain: Secondary | ICD-10-CM | POA: Diagnosis not present

## 2022-08-29 DIAGNOSIS — R140 Abdominal distension (gaseous): Secondary | ICD-10-CM | POA: Diagnosis not present

## 2022-08-31 LAB — H. PYLORI ANTIGEN, STOOL: H pylori Ag, Stl: NEGATIVE

## 2022-09-02 ENCOUNTER — Telehealth: Payer: Self-pay

## 2022-09-02 NOTE — Telephone Encounter (Signed)
Pt called into the office, she saw that her stool test for H. Pylori was negative. Pt states that since her test is negative it has to be SIBO again causing her symptoms. Pt is "passing gas" from both ends. Pt states that she tried Xifaxan in the past and it didn't help her. Pt is seeking an alternative antibiotic that she can try, she plans to go out of town soon and would like to have a prescription. Please advise, thanks.

## 2022-09-03 MED ORDER — METRONIDAZOLE 250 MG PO TABS: 250.0000 mg | ORAL_TABLET | Freq: Three times a day (TID) | ORAL | 0 refills | Status: AC

## 2022-09-03 NOTE — Telephone Encounter (Signed)
  OK to proceed with RX?

## 2022-09-03 NOTE — Addendum Note (Signed)
Addended by: Yevette Edwards on: 09/03/2022 03:45 PM   Modules accepted: Orders

## 2022-09-03 NOTE — Addendum Note (Signed)
Addended by: Yevette Edwards on: 09/03/2022 08:53 AM   Modules accepted: Orders

## 2022-09-03 NOTE — Telephone Encounter (Signed)
Lm on vm for patient to return call. RX sent to [facility] on file.

## 2022-09-04 NOTE — Telephone Encounter (Signed)
Patient giving a call back. Please give a call back to further advise.  Thank you

## 2022-09-04 NOTE — Telephone Encounter (Signed)
Returned call to patient. I informed her that we have sent her Flagyl prescription to her pharmacy on file. Pt states that she has also been experiencing issues with constipation. Pt has been advised that it is safe for her to take Miralax PRN. Pt verbalized understanding and had no concerns at the end of the call.

## 2022-10-07 ENCOUNTER — Telehealth: Payer: PPO | Admitting: Physician Assistant

## 2022-10-07 NOTE — Telephone Encounter (Signed)
PT is calling to see if she can possibly have a referral to a specialist for SIBO. She would like to speak with someone today and asks that if she doesn't answer will somone leave a voicemail with a direct number to call back. Please advise.

## 2022-10-08 NOTE — Telephone Encounter (Signed)
Anderson Malta, please see the patient request below

## 2022-10-10 ENCOUNTER — Ambulatory Visit: Payer: PPO

## 2022-10-10 NOTE — Telephone Encounter (Signed)
Called and informed patient that Anderson Malta is not familiar with any SIBO specialist, but we are checking with Dr. Silverio Decamp and we will be in touch once we have a response. Pt verbalized understanding and had no concerns at the end of the call.

## 2022-10-14 ENCOUNTER — Telehealth: Payer: Self-pay | Admitting: Family Medicine

## 2022-10-14 DIAGNOSIS — K219 Gastro-esophageal reflux disease without esophagitis: Secondary | ICD-10-CM

## 2022-10-14 DIAGNOSIS — G608 Other hereditary and idiopathic neuropathies: Secondary | ICD-10-CM

## 2022-10-14 DIAGNOSIS — E559 Vitamin D deficiency, unspecified: Secondary | ICD-10-CM

## 2022-10-14 DIAGNOSIS — Z79899 Other long term (current) drug therapy: Secondary | ICD-10-CM

## 2022-10-14 DIAGNOSIS — E039 Hypothyroidism, unspecified: Secondary | ICD-10-CM

## 2022-10-14 DIAGNOSIS — E7800 Pure hypercholesterolemia, unspecified: Secondary | ICD-10-CM

## 2022-10-14 DIAGNOSIS — E161 Other hypoglycemia: Secondary | ICD-10-CM

## 2022-10-14 DIAGNOSIS — R7989 Other specified abnormal findings of blood chemistry: Secondary | ICD-10-CM

## 2022-10-14 NOTE — Telephone Encounter (Signed)
-----   Message from Ellamae Sia sent at 10/01/2022  7:27 AM EDT ----- Regarding: Lab orders for Tuesday, 3.26.24 Patient is scheduled for CPX labs, please order future labs, Thanks , Karna Christmas

## 2022-10-15 ENCOUNTER — Other Ambulatory Visit: Payer: PPO

## 2022-10-15 ENCOUNTER — Other Ambulatory Visit (INDEPENDENT_AMBULATORY_CARE_PROVIDER_SITE_OTHER): Payer: PPO

## 2022-10-15 DIAGNOSIS — E039 Hypothyroidism, unspecified: Secondary | ICD-10-CM

## 2022-10-15 DIAGNOSIS — R7989 Other specified abnormal findings of blood chemistry: Secondary | ICD-10-CM | POA: Diagnosis not present

## 2022-10-15 DIAGNOSIS — E7800 Pure hypercholesterolemia, unspecified: Secondary | ICD-10-CM | POA: Diagnosis not present

## 2022-10-15 DIAGNOSIS — E559 Vitamin D deficiency, unspecified: Secondary | ICD-10-CM | POA: Diagnosis not present

## 2022-10-15 DIAGNOSIS — G608 Other hereditary and idiopathic neuropathies: Secondary | ICD-10-CM

## 2022-10-15 DIAGNOSIS — Z79899 Other long term (current) drug therapy: Secondary | ICD-10-CM

## 2022-10-15 NOTE — Telephone Encounter (Signed)
PT is calling to get an update on SIBO referral

## 2022-10-15 NOTE — Telephone Encounter (Signed)
Dr. Derrill Kay has retired, please send referral to [facility].  Thank you

## 2022-10-16 ENCOUNTER — Ambulatory Visit: Payer: PPO | Admitting: Dermatology

## 2022-10-16 ENCOUNTER — Encounter: Payer: Self-pay | Admitting: Dermatology

## 2022-10-16 VITALS — BP 126/76 | HR 54

## 2022-10-16 DIAGNOSIS — L578 Other skin changes due to chronic exposure to nonionizing radiation: Secondary | ICD-10-CM | POA: Diagnosis not present

## 2022-10-16 DIAGNOSIS — L649 Androgenic alopecia, unspecified: Secondary | ICD-10-CM | POA: Diagnosis not present

## 2022-10-16 DIAGNOSIS — L720 Epidermal cyst: Secondary | ICD-10-CM | POA: Diagnosis not present

## 2022-10-16 DIAGNOSIS — L820 Inflamed seborrheic keratosis: Secondary | ICD-10-CM | POA: Diagnosis not present

## 2022-10-16 DIAGNOSIS — R202 Paresthesia of skin: Secondary | ICD-10-CM

## 2022-10-16 DIAGNOSIS — L570 Actinic keratosis: Secondary | ICD-10-CM | POA: Diagnosis not present

## 2022-10-16 DIAGNOSIS — D692 Other nonthrombocytopenic purpura: Secondary | ICD-10-CM

## 2022-10-16 LAB — COMPREHENSIVE METABOLIC PANEL
ALT: 11 U/L (ref 0–35)
AST: 17 U/L (ref 0–37)
Albumin: 4.4 g/dL (ref 3.5–5.2)
Alkaline Phosphatase: 93 U/L (ref 39–117)
BUN: 17 mg/dL (ref 6–23)
CO2: 29 mEq/L (ref 19–32)
Calcium: 9.3 mg/dL (ref 8.4–10.5)
Chloride: 104 mEq/L (ref 96–112)
Creatinine, Ser: 0.7 mg/dL (ref 0.40–1.20)
GFR: 84.15 mL/min (ref >60.00)
Glucose, Bld: 72 mg/dL (ref 70–99)
Potassium: 3.9 mEq/L (ref 3.5–5.1)
Sodium: 140 mEq/L (ref 135–145)
Total Bilirubin: 0.5 mg/dL (ref 0.2–1.2)
Total Protein: 6.5 g/dL (ref 6.0–8.3)

## 2022-10-16 LAB — CBC WITH DIFFERENTIAL/PLATELET
Basophils Absolute: 0.1 10*3/uL (ref 0.0–0.1)
Basophils Relative: 1.2 % (ref 0.0–3.0)
Eosinophils Absolute: 0.2 10*3/uL (ref 0.0–0.7)
Eosinophils Relative: 2.8 % (ref 0.0–5.0)
HCT: 37.9 % (ref 36.0–46.0)
Hemoglobin: 12.8 g/dL (ref 12.0–15.0)
Lymphocytes Relative: 27 % (ref 12.0–46.0)
Lymphs Abs: 1.6 10*3/uL (ref 0.7–4.0)
MCHC: 33.8 g/dL (ref 30.0–36.0)
MCV: 91.5 fl (ref 78.0–100.0)
Monocytes Absolute: 0.8 10*3/uL (ref 0.1–1.0)
Monocytes Relative: 13.3 % — ABNORMAL HIGH (ref 3.0–12.0)
Neutro Abs: 3.3 10*3/uL (ref 1.4–7.7)
Neutrophils Relative %: 55.7 % (ref 43.0–77.0)
Platelets: 199 10*3/uL (ref 150.0–400.0)
RBC: 4.14 Mil/uL (ref 3.87–5.11)
RDW: 13.4 % (ref 11.5–15.5)
WBC: 5.9 10*3/uL (ref 4.0–10.5)

## 2022-10-16 LAB — LIPID PANEL
Cholesterol: 197 mg/dL (ref 0–200)
HDL: 62.3 mg/dL (ref >39.00)
LDL Cholesterol: 114 mg/dL — ABNORMAL HIGH (ref 0–99)
NonHDL: 134.87
Total CHOL/HDL Ratio: 3
Triglycerides: 104 mg/dL (ref 0.0–149.0)
VLDL: 20.8 mg/dL (ref 0.0–40.0)

## 2022-10-16 LAB — TSH: TSH: 0.75 u[IU]/mL (ref 0.35–5.50)

## 2022-10-16 LAB — FERRITIN: Ferritin: 227.1 ng/mL (ref 10.0–291.0)

## 2022-10-16 LAB — VITAMIN B12: Vitamin B-12: 701 pg/mL (ref 211–911)

## 2022-10-16 LAB — IRON: Iron: 76 ug/dL (ref 42–145)

## 2022-10-16 LAB — VITAMIN D 25 HYDROXY (VIT D DEFICIENCY, FRACTURES): VITD: 62.5 ng/mL (ref 30.00–100.00)

## 2022-10-16 NOTE — Progress Notes (Unsigned)
Follow-Up Visit   Subjective  Morgan [name] is a 77 y.o. female who presents for the following: Actinic keratosis. Has not started 5FU/Calcipotriene treatment since last vsiit. Arms, hands, face. States she thinks her arms and hands are doing well. Androgenetic alopecia. Not taking Dutasteride 0.5 mg once daily. Read side effects and did not want to start.  The patient has spots, moles and lesions to be evaluated, some may be new or changing and the patient has concerns that these could be cancer.   The following portions of the chart were reviewed this encounter and updated as appropriate: medications, allergies, medical history  Review of Systems:  No other skin or systemic complaints except as noted in HPI or Assessment and Plan.  Objective  Well appearing patient in no apparent distress; mood and affect are within normal limits.  A focused examination was performed of the following areas: Face, arms, hands, scalp  Relevant exam findings are noted in the Assessment and Plan.    Assessment & Plan   Notalgia paresthetica Back. - Perispinal hyperpigmented patch - Chronic condition without cure - Secondary to pinched nerve along spine causing itching or sensation changes in an area of skin. Chronic rubbing or scratching causes darkening of the skin.  - OTC treatments which can help with itch include numbing creams like pramoxine or lidocaine which temporarily reduce itch or Capsaicin-containing creams which cause a burning sensation but which sometimes over time will reset the nerves to stop producing itch.  - If you choose to use Capsaicin cream, it is recommended to use it 5 times daily for 1 week followed by 3 times daily for 3-6 weeks. You may have to continue using it long-term.  - If not doing well with OTC options, could consider [facility] compounded prescription anti-itch cream with Amitriptyline 5% / Lidocaine 5% / Pramoxine 1% or Amitriptyline 5% / Gabapentin 10% /  Lidocaine 5% Cream - For severe cases, there are some prescription cream or pill options which may help.   ACTINIC KERATOSIS  Exam: Erythematous thin papules/macules with gritty scale  Actinic keratoses are precancerous spots that appear secondary to cumulative UV radiation exposure/sun exposure over time. They are chronic with expected duration over 1 year. A portion of actinic keratoses will progress to squamous cell carcinoma of the skin. It is not possible to reliably predict which spots will progress to skin cancer and so treatment is recommended to prevent development of skin cancer.  Recommend daily broad spectrum sunscreen SPF 30+ to sun-exposed areas, reapply every 2 hours as needed.  Recommend staying in the shade or wearing long sleeves, sun glasses (UVA+UVB protection) and wide brim hats (4-inch brim around the entire circumference of the hat). Call for new or changing lesions.  Treatment Plan: Re-Start 5-fluorouracil/calcipotriene cream twice a day for 7 days to affected areas including arms and hands (avoid hand washing for 5 minutes after application to hands)   Reviewed course of treatment and expected reaction.  Patient advised to expect inflammation and crusting and advised that erosions are possible.  Patient advised to be diligent with sun protection during and after treatment. Counseled to keep medication out of reach of children and pets.  ACTINIC KERATOSIS Exam: Erythematous thin papules/macules with gritty scale  Actinic keratoses are precancerous spots that appear secondary to cumulative UV radiation exposure/sun exposure over time. They are chronic with expected duration over 1 year. A portion of actinic keratoses will progress to squamous cell carcinoma of the skin. It  is not possible to reliably predict which spots will progress to skin cancer and so treatment is recommended to prevent development of skin cancer.  Recommend daily broad spectrum sunscreen SPF 30+ to  sun-exposed areas, reapply every 2 hours as needed.  Recommend staying in the shade or wearing long sleeves, sun glasses (UVA+UVB protection) and wide brim hats (4-inch brim around the entire circumference of the hat). Call for new or changing lesions.  Treatment Plan:  Prior to procedure, discussed risks of blister formation, small wound, skin dyspigmentation, or rare scar following cryotherapy. Recommend Vaseline ointment to treated areas while healing.  Destruction Procedure Note Destruction method: cryotherapy   Informed consent: discussed and consent obtained   Lesion destroyed using liquid nitrogen: Yes   Outcome: patient tolerated procedure well with no complications   Post-procedure details: wound care instructions given   Locations: mid forehead x1 # of Lesions Treated: 1   Purpura - Chronic; persistent and recurrent.  Treatable, but not curable. - Violaceous macules and patches - Benign - Related to trauma, age, sun damage and/or use of blood thinners, chronic use of topical and/or oral steroids - Observe - Can use OTC arnica containing moisturizer such as Dermend Bruise Formula or DerMend Fragile Skin Formula if desired - Call for worsening or other concerns  Milia Left forehead. Symptomatic, irritating, patient would like treated.  Extraction performed today.  Roof removed with #11 blade. Lesion removed with 2 CTA's. Wound care instructions given.   Benign-appearing.  Call clinic for new or changing lesions.   Prior to procedure, discussed risks of blister formation, small wound, skin dyspigmentation, or rare scar following treatment. Recommend Vaseline ointment to treated areas while healing.  - tiny firm white papules - type of cyst - benign - may be extracted if symptomatic - observe  INFLAMED SEBORRHEIC KERATOSIS Exam: Erythematous keratotic or waxy stuck-on papule or plaque.  Symptomatic, irritating, patient would like treated.  Benign-appearing.   Call clinic for new or changing lesions.   Prior to procedure, discussed risks of blister formation, small wound, skin dyspigmentation, or rare scar following treatment. Recommend Vaseline ointment to treated areas while healing.  Destruction Procedure Note Destruction method: cryotherapy   Informed consent: discussed and consent obtained   Lesion destroyed using liquid nitrogen: Yes   Outcome: patient tolerated procedure well with no complications   Post-procedure details: wound care instructions given   Locations: left flank x1 # of Lesions Treated: 1  Dermatitis  Exam: Erythematous scaly patch at back.  Treatment Plan: Start Halobetasol cream twice daily up to 2 weeks as needed for rash/itching. Avoid applying to face, groin, and axilla. Use as directed. Long-term use can cause thinning of the skin.  ANDROGENETIC ALOPECIA (FEMALE PATTERN HAIR LOSS) Exam: Diffuse thinning of the crown and widening of the midline part with retention of the frontal hairline  Chronic and persistent condition with duration or expected duration over one year. Condition is symptomatic/ bothersome to patient. Not currently at goal.   Female Androgenic Alopecia is a chronic condition related to genetics and/or hormonal changes.  In women androgenetic alopecia is commonly associated with menopause but may occur any time after puberty.  It causes hair thinning primarily on the crown with widening of the part and temporal hairline recession.  Can use OTC Rogaine (minoxidil) 5% solution/foam as directed.  Oral treatments in female patients who have no contraindication may include : - Low dose oral minoxidil 1.25 - 5mg  daily - Spironolactone 50 - 100mg  bid -  Finasteride 2.5 - 5 mg daily Adjunctive therapies include: - Low Level Laser Light Therapy (LLLT) - Platelet-rich plasma injections (PRP) - Hair Transplants or scalp reduction   Treatment Plan: Patient deferred treatment at this time.    Return in  about 2 months (around 12/16/2022) for AK Follow Up, ISK Follow Up.  I, Emelia Salisbury, CMA, am acting as scribe for Forest Gleason, MD.   Documentation: I have reviewed the above documentation for accuracy and completeness, and I agree with the above.  Forest Gleason, MD

## 2022-10-16 NOTE — Telephone Encounter (Signed)
Called and spoke with patient regarding Dr. Woodward Ku recommendations. Patient would like to proceed with referral to [facility]. Patient knows that referral is being placed today and she should expect a call within a couple of weeks from [facility] to schedule her appt. Patient is aware that she will still be Dr. Woodward Ku patient. Pt verbalized understanding and had no concerns at the end of the call.  Referral, records, patient's demographic and insurance information have been faxed to [facility] (P: [phone], F: [phone])

## 2022-10-16 NOTE — Patient Instructions (Addendum)
Re-Start 5-fluorouracil/calcipotriene cream twice a day for 7 days to affected areas including arms and hands (avoid hand washing for 5 minutes after application to hands) Recommend treating one arm/hand at a time. Reviewed course of treatment and expected reaction.  Patient advised to expect inflammation and crusting and advised that erosions are possible.  Patient advised to be diligent with sun protection during and after treatment. Counseled to keep medication out of reach of children and pets.  Instructions for [facility] Medications  One or more of your medications was sent to the [facility] mail order compounding pharmacy. You will receive an email from them and can purchase the medicine through that link. It will then be mailed to your home at the address you confirmed. If for any reason you do not receive an email from them, please check your spam folder. If you still do not find the email, please let us know. [facility] phone number is [phone].    Thin skin on arms: DerMend Fragile Skin Formula   Back: Start Halobetasol cream twice daily up to 2 weeks as needed for rash/itching. Avoid applying to face, groin, and axilla. Use as directed. Long-term use can cause thinning of the skin.   Cryotherapy Aftercare  Wash gently with soap and water everyday.   Apply Vaseline and Band-Aid daily until healed.    **Recommend Alastin Restorative Skin Complex twice a day to face at chest. Under makeup.  **Recommend Sunscreen SPF 30+ every morning  **Recommend Dermend fragile skin formula once to twice a day for anti-aging benefits and to thicken skin.   Due to recent changes in healthcare laws, you may see results of your pathology and/or laboratory studies on MyChart before the doctors have had a chance to review them. We understand that in some cases there may be results that are confusing or concerning to you. Please understand that not all results are received at the  same time and often the doctors may need to interpret multiple results in order to provide you with the best plan of care or course of treatment. Therefore, we ask that you please give [place] 2 business days to thoroughly review all your results before contacting the office for clarification. Should we see a critical lab result, you will be contacted sooner.   If You Need Anything After Your Visit  If you have any questions or concerns for your doctor, please call our main line at [phone] and press option 4 to reach your doctor's medical assistant. If no one answers, please leave a voicemail as directed and we will return your call as soon as possible. Messages left after 4 pm will be answered the following business day.   You may also send [place] a message via [place]. We typically respond to MyChart messages within 1-2 business days.  For prescription refills, please ask your pharmacy to contact our office. Our fax number is [phone].  If you have an urgent issue when the clinic is closed that cannot wait until the next business day, you can page your doctor at the number below.    Please note that while we do our best to be available for urgent issues outside of office hours, we are not available 24/7.   If you have an urgent issue and are unable to reach [place], you may choose to seek medical care at your doctor's office, retail clinic, urgent care center, or emergency room.  If you have a medical emergency, please immediately call 911 or  go to the emergency department.  Pager Numbers  - Dr. Nehemiah Massed: [phone]  - Dr. Laurence Ferrari: [phone]  - Dr. Nicole Kindred: [phone]  In the event of inclement weather, please call our main line at [phone] for an update on the status of any delays or closures.  Dermatology Medication Tips: Please keep the boxes that topical medications come in in order to help keep track of the instructions about where and how to use these. Pharmacies typically  print the medication instructions only on the boxes and not directly on the medication tubes.   If your medication is too expensive, please contact our office at [phone] option 4 or send [place] a message through [place].   We are unable to tell what your co-pay for medications will be in advance as this is different depending on your insurance coverage. However, we may be able to find a substitute medication at lower cost or fill out paperwork to get insurance to cover a needed medication.   If a prior authorization is required to get your medication covered by your insurance company, please allow [place] 1-2 business days to complete this process.  Drug prices often vary depending on where the prescription is filled and some pharmacies may offer cheaper prices.  The website www.goodrx.com contains coupons for medications through different pharmacies. The prices here do not account for what the cost may be with help from insurance (it may be cheaper with your insurance), but the website can give you the price if you did not use any insurance.  - You can print the associated coupon and take it with your prescription to the pharmacy.  - You may also stop by our office during regular business hours and pick up a GoodRx coupon card.  - If you need your prescription sent electronically to a different pharmacy, notify our office through [facility] or by phone at [phone] option 4.     Si Usted Necesita Algo Despus de Su Visita  Tambin puede enviarnos un mensaje a travs de Pharmacist, community. Por lo general respondemos a los mensajes de MyChart en el transcurso de 1 a 2 das hbiles.  Para renovar recetas, por favor pida a su farmacia que se ponga en contacto con nuestra oficina. Harland Dingwall de fax es [place] [phone].  Si tiene un asunto urgente cuando la clnica est cerrada y que no puede esperar hasta el siguiente da hbil, puede llamar/localizar a su doctor(a) al nmero que aparece a  continuacin.   Por favor, tenga en cuenta que aunque hacemos todo lo posible para estar disponibles para asuntos urgentes fuera del horario de [place], no estamos disponibles las 24 horas del da, los 7 das de la [place].   Si tiene un problema urgente y no puede comunicarse con nosotros, puede optar por buscar atencin mdica  en el consultorio de su doctor(a), en una clnica privada, en un centro de atencin urgente o en una sala de emergencias.  Si tiene Engineering geologist, por favor llame inmediatamente al 911 o vaya a la sala de emergencias.  Nmeros de bper  - Dr. Nehemiah Massed: [phone]  - Dra. Moye: [phone]  - Dra. Nicole Kindred: [phone]  En caso de inclemencias del [place], por favor llame a Johnsie Kindred principal al [phone] para una actualizacin sobre el [place] de cualquier retraso o cierre.  Consejos para la medicacin en dermatologa: Por favor, guarde las cajas en las que vienen los medicamentos de uso tpico para ayudarle a seguir las instrucciones sobre dnde y  cmo usarlos. Las farmacias generalmente imprimen las instrucciones del medicamento slo en las cajas y no directamente en los tubos del [place].   Si su medicamento es muy caro, por favor, pngase en contacto con Zigmund Daniel llamando al [phone] y presione la opcin 4 o envenos un mensaje a travs de Pharmacist, community.   No podemos decirle cul ser su copago por los medicamentos por adelantado ya que esto es diferente dependiendo de la cobertura de su seguro. Sin embargo, es posible que podamos encontrar un medicamento sustituto a Electrical engineer un formulario para que el seguro cubra el medicamento que se considera necesario.   Si se requiere una autorizacin previa para que su compaa de seguros [place] su medicamento, por favor permtanos de 1 a 2 das hbiles para completar este proceso.  Los precios de los medicamentos varan con frecuencia dependiendo del Environmental consultant de dnde se surte la receta  y alguna farmacias pueden ofrecer precios ms baratos.  El sitio web www.goodrx.com tiene cupones para medicamentos de Airline pilot. Los precios aqu no tienen en cuenta lo que podra costar con la ayuda del seguro (puede ser ms barato con su seguro), pero el sitio web puede darle el precio si no utiliz Research scientist (physical sciences).  - Puede imprimir el cupn correspondiente y llevarlo con su receta a la farmacia.  - Tambin puede pasar por nuestra oficina durante el horario de atencin regular y Charity fundraiser una tarjeta de cupones de GoodRx.  - Si necesita que su receta se enve electrnicamente a una farmacia diferente, informe a nuestra oficina a travs de MyChart de [place] o por telfono llamando al [phone] y presione la opcin 4.

## 2022-10-21 ENCOUNTER — Encounter: Payer: PPO | Admitting: Family Medicine

## 2022-10-22 ENCOUNTER — Ambulatory Visit (INDEPENDENT_AMBULATORY_CARE_PROVIDER_SITE_OTHER): Payer: PPO | Admitting: Family Medicine

## 2022-10-22 ENCOUNTER — Encounter: Payer: Self-pay | Admitting: Family Medicine

## 2022-10-22 VITALS — BP 114/62 | HR 50 | Temp 97.9°F | Ht 64.0 in | Wt 146.4 lb

## 2022-10-22 DIAGNOSIS — E039 Hypothyroidism, unspecified: Secondary | ICD-10-CM

## 2022-10-22 DIAGNOSIS — R7989 Other specified abnormal findings of blood chemistry: Secondary | ICD-10-CM

## 2022-10-22 DIAGNOSIS — Z79899 Other long term (current) drug therapy: Secondary | ICD-10-CM

## 2022-10-22 DIAGNOSIS — Z1211 Encounter for screening for malignant neoplasm of colon: Secondary | ICD-10-CM

## 2022-10-22 DIAGNOSIS — E559 Vitamin D deficiency, unspecified: Secondary | ICD-10-CM

## 2022-10-22 DIAGNOSIS — G4700 Insomnia, unspecified: Secondary | ICD-10-CM | POA: Diagnosis not present

## 2022-10-22 DIAGNOSIS — E7800 Pure hypercholesterolemia, unspecified: Secondary | ICD-10-CM | POA: Diagnosis not present

## 2022-10-22 DIAGNOSIS — R142 Eructation: Secondary | ICD-10-CM | POA: Diagnosis not present

## 2022-10-22 DIAGNOSIS — Z0000 Encounter for general adult medical examination without abnormal findings: Secondary | ICD-10-CM

## 2022-10-22 DIAGNOSIS — K219 Gastro-esophageal reflux disease without esophagitis: Secondary | ICD-10-CM

## 2022-10-22 DIAGNOSIS — E2839 Other primary ovarian failure: Secondary | ICD-10-CM

## 2022-10-22 NOTE — Assessment & Plan Note (Signed)
No longer on this  Still takes B12 three days weekly   Lab Results  Component Value Date   VITAMINB12 701 10/15/2022

## 2022-10-22 NOTE — Assessment & Plan Note (Signed)
Hypothyroidism  Pt has no clinical changes No change in energy level/ hair or skin/ edema and no tremor Lab Results  Component Value Date   TSH 0.75 10/15/2022    Taking levothy 25 mcg daily  Under care of endocrinology

## 2022-10-22 NOTE — Assessment & Plan Note (Signed)
No longer on ppi Has pepcid prn  Pt thinks her bigger problem is small bowel bact overgrowth at opposed to GERD Is waiting on a 2nd opinion for this

## 2022-10-22 NOTE — Progress Notes (Signed)
Subjective:    Patient ID: Morgan [name], female    DOB: 10-Dec-1945, 77 y.o.   MRN: [id number]  HPI Here for health maintenance exam and to review chronic medical problems    Wt Readings from Last 3 Encounters:  10/22/22 146 lb 6 oz (66.4 kg)  08/27/22 148 lb 8 oz (67.4 kg)  03/18/22 144 lb 6.4 oz (65.5 kg)   25.13 kg/m  Vitals:   10/22/22 1450  BP: 114/62  Pulse: (!) 50  Temp: 97.9 F (36.6 C)  SpO2: 98%   Feels good overall except for GI problems  2nd opinion   Ccy and gallstones removed from bile duct   Cares for husband  AK and dementia    Immunization History  Administered Date(s) Administered   Fluad Quad(high Dose 65+) 04/03/2019, 04/23/2019, 07/04/2020   Influenza,inj,Quad PF,6+ Mos 04/13/2014, 09/22/2015, 08/12/2016, 08/06/2017   Pneumococcal Conjugate-13 04/13/2014   Pneumococcal Polysaccharide-23 09/15/2012   Td 11/07/1997, 01/28/2007   Tdap 05/28/2017   Zoster Recombinat (Shingrix) 02/13/2018, 07/20/2018   Zoster, Live 09/09/2006   Health Maintenance Due  Topic Date Due   COVID-19 Vaccine (1) Never done   Medicare Annual Wellness (AWV)  09/27/2022   Mammogram 04/2022 Self breast exam : no lumps   Colonoscopy 03/2019 with 5 y recall     Dexa 102021   Falls - none  Fractures- none  Supplements - taking vitamin D  Last vitamin D Lab Results  Component Value Date   VD25OH 62.50 10/15/2022    Exercise :  No time /taking care of husband Never sits down   Gyn health no problems   Derm follow up - last month    Mood  Caregiver stress   PHQ :0   Takes occ antihistamine for sleep  Occ 1 ibuprofen for pain     GERD-no longer a problem  Small bowel bacteria growth causes her belching - looking for a 2nd opinion  On ppi in the past  Pepcid  Under care of GI   Lab Results  Component Value Date   VITAMINB12 701 10/15/2022      Hypothyroidism  Pt has no clinical changes No change in energy level/ hair or skin/ edema  and no tremor Lab Results  Component Value Date   TSH 0.75 10/15/2022    Levothyroxine 25 mcg daily  Sees endocrinology H/o thyroid nodules     Past elevated ferritin Lab Results  Component Value Date   WBC 5.9 10/15/2022   HGB 12.8 10/15/2022   HCT 37.9 10/15/2022   MCV 91.5 10/15/2022   PLT 199.0 10/15/2022   Lab Results  Component Value Date   IRON 76 10/15/2022   TIBC 353 09/18/2021   FERRITIN 227.1 10/15/2022    Now nl   Past elevated LFTs Lab Results  Component Value Date   ALT 11 10/15/2022   AST 17 10/15/2022   ALKPHOS 93 10/15/2022   BILITOT 0.5 10/15/2022    Had ccy   Hyperlipidemia Lab Results  Component Value Date   CHOL 197 10/15/2022   CHOL 195 09/27/2021   CHOL 205 (H) 07/31/2020   Lab Results  Component Value Date   HDL 62.30 10/15/2022   HDL 64.00 09/27/2021   HDL 50.90 07/31/2020   Lab Results  Component Value Date   LDLCALC 114 (H) 10/15/2022   LDLCALC 113 (H) 09/27/2021   LDLCALC 132 (H) 07/31/2020   Lab Results  Component Value Date   TRIG 104.0 10/15/2022  TRIG 86.0 09/27/2021   TRIG 112.0 07/31/2020   Lab Results  Component Value Date   CHOLHDL 3 10/15/2022   CHOLHDL 3 09/27/2021   CHOLHDL 4 07/31/2020   Lab Results  Component Value Date   LDLDIRECT 148.8 10/09/2010   LDLDIRECT 166.4 08/09/2010   LDLDIRECT 144.5 01/29/2008   Statin intolerant  Diet controlled   The 10-year ASCVD risk score (Arnett DK, et al., 2019) is: 20.2%   Values used to calculate the score:     Age: 57 years     Sex: Female     Is Non-Hispanic African American: No     Diabetic: No     Tobacco smoker: Yes     Systolic Blood Pressure: 99991111 mmHg     Is BP treated: No     HDL Cholesterol: 62.3 mg/dL     Total Cholesterol: 197 mg/dL  No significant change in diet  No beef   Patient Active Problem List   Diagnosis Date Noted   Estrogen deficiency 10/22/2022   Pre-syncope 11/30/2021   Insomnia 11/30/2021   Bradycardia 11/30/2021    Fasting hypoglycemia 10/03/2021   S/P laparoscopic cholecystectomy 08/22/2021   Idiopathic small fiber sensory neuropathy 04/12/2020   Belching 02/09/2020   Vitamin D deficiency 10/05/2019   Colon cancer screening 02/09/2019   Paresthesia of foot, bilateral 08/12/2016   Encounter for Medicare annual wellness exam 04/13/2014   Allergic rhinitis 04/13/2014   Routine general medical examination at a health care facility 04/07/2014   Multiple thyroid nodules 01/24/2014   History of small bowel obstruction 01/20/2014   DDD (degenerative disc disease), cervical    DDD (degenerative disc disease), lumbar    Spinal stenosis    TINNITUS 06/08/2010   Hypothyroidism 04/18/2010   GERD 05/03/2008   DIVERTICULOSIS OF COLON 04/26/2008   HEPATITIS, HX OF 04/26/2008   COLONIC POLYPS, HYPERPLASTIC, HX OF 04/26/2008   HEMORRHOIDS, INTERNAL 01/11/2008   DERMATITIS, SEBORRHEIC NEC 01/28/2007   Hyperlipidemia 01/19/2007   IBS 01/19/2007   OVERACTIVE BLADDER 01/19/2007   POSTMENOPAUSAL STATUS 01/19/2007   ECZEMA 01/19/2007   Osteoarthritis 01/19/2007   Past Medical History:  Diagnosis Date   Allergy    Bradycardia    Collagen vascular disease    Connective tissue disease    COVID-19    DDD (degenerative disc disease), cervical    DDD (degenerative disc disease), lumbar    Endometrial polyp    not resolved with D&C (following)   GERD (gastroesophageal reflux disease)    gas/belching issues-was on protonix for short time-not since 6 months   HLD (hyperlipidemia)    Mixed connective tissue disease diagnosed 5 years ago   no problems since diagnosis   Multiple thyroid nodules 01/24/2014   Obesity    Osteoarthritis of cervical spine    Renal calculi    Spinal stenosis    Past Surgical History:  Procedure Laterality Date   APPENDECTOMY     CHOLECYSTECTOMY N/A 08/22/2021   Procedure: LAPAROSCOPIC CHOLECYSTECTOMY;  Surgeon: Coralie Keens, MD;  Location: [facility];  Service:  General;  Laterality: N/A;   COLONOSCOPY     DILATION AND CURETTAGE OF UTERUS     fibroids   DILATION AND CURETTAGE OF UTERUS  01/2005   endometrial polyp   ENDOMETRIAL BIOPSY  2008   fibroid   ENDOSCOPIC RETROGRADE CHOLANGIOPANCREATOGRAPHY (ERCP) WITH PROPOFOL N/A 01/21/2022   Procedure: ENDOSCOPIC RETROGRADE CHOLANGIOPANCREATOGRAPHY (ERCP) WITH PROPOFOL;  Surgeon: Gatha Mayer, MD;  Location: [facility] ENDOSCOPY;  Service:  Gastroenterology;  Laterality: N/A;   HAND SURGERY     carpal tunnel release bilateral Dr. Daylene Katayama   hysteroscopic resection      polyps of PMB   REMOVAL OF STONES  01/21/2022   Procedure: REMOVAL OF STONES;  Surgeon: Gatha Mayer, MD;  Location: [facility];  Service: Gastroenterology;;   Joan Mayans  01/21/2022   Procedure: SPHINCTEROTOMY;  Surgeon: Gatha Mayer, MD;  Location: [facility] ENDOSCOPY;  Service: Gastroenterology;;   TONSILLECTOMY     TUBAL LIGATION     1973   UPPER GASTROINTESTINAL ENDOSCOPY     Social History   Tobacco Use   Smoking status: Former    Types: Cigarettes    Quit date: 07/22/1965    Years since quitting: 57.2   Smokeless tobacco: Never   Tobacco comments:    Smoked for 2 years as a teen  Scientific laboratory technician Use: Never used  Substance Use Topics   Alcohol use: No   Drug use: No   Family History  Problem Relation Age of Onset   Dementia Mother    Coronary artery disease Father    Heart attack Father        deceased at 45   Diabetes Maternal Grandmother    Goiter Maternal Grandmother    Cancer Grandchild        rare type (dying at age 71)   Diabetes Cousin        multiple   Colon cancer Neg Hx    Esophageal cancer Neg Hx    Stomach cancer Neg Hx    Rectal cancer Neg Hx    Colon polyps Neg Hx    Allergies  Allergen Reactions   Codeine Nausea And Vomiting   Hydrocodone-Acetaminophen     REACTION: reaction not known   Penicillins Hives   Current Outpatient Medications on File Prior to Visit  Medication Sig Dispense  Refill   Cholecalciferol (VITAMIN D3) 25 MCG (1000 UT) CAPS Take 2,000 Units by mouth daily in the afternoon.     clobetasol (OLUX) 0.05 % topical foam Apply topically 2 (two) times daily. (Patient taking differently: Apply 1 Application topically daily as needed (Dermatitis).) 50 g 5   Cyanocobalamin (VITAMIN B12) 1000 MCG TBCR Take 1,000 mcg by mouth every Monday, Wednesday, and Friday.     famotidine (PEPCID) 20 MG tablet Take 1 tablet (20 mg total) by mouth at bedtime. 30 tablet 2   halobetasol (ULTRAVATE) 0.05 % cream APPLY TOPICALLY 2 TIMES DAILY AS NEEDED FOR UP TO 14 DAYS. AVOID FACE, GROIN ANDAXILLA (Patient taking differently: Apply 1 Application topically daily as needed (out break on skin).) 30 g 0   hydrocortisone 2.5 % cream twice daily to crease of nose up to 2 weeks as needed. 20 g 1   hydrocortisone valerate cream (WESTCORT) 0.2 % APPLY TOPICALLY TWICE DAILY 15 g 2   ketoconazole (NIZORAL) 2 % shampoo SHAMPOO INTO THE SCALP. LET SIT 5-10 MINUTES THEN WASH OUT. USE 3 DAYS PER WEEK 120 mL 3   levothyroxine (SYNTHROID) 25 MCG tablet Take 25 mcg by mouth daily before breakfast.     polyethylene glycol powder (GLYCOLAX/MIRALAX) 17 GM/SCOOP powder Take 17 g by mouth as needed for mild constipation or moderate constipation.     triamcinolone cream (KENALOG) 0.1 % Apply 1 Application topically daily as needed (out- break on skin).     triamcinolone ointment (KENALOG) 0.1 % APPLY TWICE DAILY FOR UP TO 2 WEEKS FOR "COOL DOWN". AVOID APPLYING  TO FACE, GROIN, AND AXILLA. USE AS DIRECTED. LONGTERM USE CAN CAUSE THINNING OF THE SKIN 80 g 0   valACYclovir (VALTREX) 1000 MG tablet TAKE 2 TABLET BY MOUTH AT FIRST ONSET OFSYMPTOMS OF COLD SORES, THEN TAKE 2 TABLETS BY MOUTH 12 HOURS LATER (Patient taking differently: Take 1,000 mg by mouth See admin instructions. TAKE 2 TABLET BY MOUTH AT FIRST ONSET OFSYMPTOMS OF COLD SORES, THEN TAKE 2 TABLETS BY MOUTH 12 HOURS LATER as needed) 30 tablet 10   No  current facility-administered medications on file prior to visit.     Review of Systems  Constitutional:  Negative for activity change, appetite change, fatigue, fever and unexpected weight change.  HENT:  Negative for congestion, ear pain, rhinorrhea, sinus pressure and sore throat.   Eyes:  Negative for pain, redness and visual disturbance.  Respiratory:  Negative for cough, shortness of breath and wheezing.   Cardiovascular:  Negative for chest pain and palpitations.  Gastrointestinal:  Negative for abdominal pain, blood in stool, constipation, diarrhea and nausea.       Gas  bloating  Endocrine: Negative for polydipsia and polyuria.  Genitourinary:  Negative for dysuria, frequency and urgency.  Musculoskeletal:  Negative for arthralgias, back pain and myalgias.  Skin:  Negative for pallor and rash.  Allergic/Immunologic: Negative for environmental allergies.  Neurological:  Negative for dizziness, syncope and headaches.  Hematological:  Negative for adenopathy. Does not bruise/bleed easily.  Psychiatric/Behavioral:  Negative for decreased concentration and dysphoric mood. The patient is not nervous/anxious.        Stress of care giving        Objective:   Physical Exam Constitutional:      General: She is not in acute distress.    Appearance: Normal appearance. She is well-developed and normal weight. She is not ill-appearing or diaphoretic.  HENT:     Head: Normocephalic and atraumatic.     Right Ear: Tympanic membrane, ear canal and external ear normal.     Left Ear: Tympanic membrane, ear canal and external ear normal.     Nose: Nose normal. No congestion.     Mouth/Throat:     Mouth: Mucous membranes are moist.     Pharynx: Oropharynx is clear. No posterior oropharyngeal erythema.  Eyes:     General: No scleral icterus.    Extraocular Movements: Extraocular movements intact.     Conjunctiva/sclera: Conjunctivae normal.     Pupils: Pupils are equal, round, and reactive  to light.  Neck:     Thyroid: No thyromegaly.     Vascular: No carotid bruit or JVD.  Cardiovascular:     Rate and Rhythm: Normal rate and regular rhythm.     Pulses: Normal pulses.     Heart sounds: Normal heart sounds.     No gallop.  Pulmonary:     Effort: Pulmonary effort is normal. No respiratory distress.     Breath sounds: Normal breath sounds. No wheezing.     Comments: Good air exch Chest:     Chest wall: No tenderness.  Abdominal:     General: Bowel sounds are normal. There is no distension or abdominal bruit.     Palpations: Abdomen is soft. There is no mass.     Tenderness: There is no abdominal tenderness.     Hernia: No hernia is present.  Genitourinary:    Comments: Breast exam: No mass, nodules, thickening, tenderness, bulging, retraction, inflamation, nipple discharge or skin changes noted.  No axillary or clavicular  LA.     Musculoskeletal:        General: No tenderness. Normal range of motion.     Cervical back: Normal range of motion and neck supple. No rigidity. No muscular tenderness.     Right lower leg: No edema.     Left lower leg: No edema.     Comments: No kyphosis   Lymphadenopathy:     Cervical: No cervical adenopathy.  Skin:    General: Skin is warm and dry.     Coloration: Skin is not pale.     Findings: No erythema or rash.     Comments: Solar lentigines diffusely   Neurological:     Mental Status: She is alert. Mental status is at baseline.     Cranial Nerves: No cranial nerve deficit.     Motor: No abnormal muscle tone.     Coordination: Coordination normal.     Gait: Gait normal.     Deep Tendon Reflexes: Reflexes are normal and symmetric. Reflexes normal.  Psychiatric:        Mood and Affect: Mood normal.        Cognition and Memory: Cognition and memory normal.           Assessment & Plan:   Problem List Items Addressed This Visit       Digestive   GERD    No longer on ppi Has pepcid prn  Pt thinks her bigger problem  is small bowel bact overgrowth at opposed to GERD Is waiting on a 2nd opinion for this         Endocrine   Hypothyroidism    Hypothyroidism  Pt has no clinical changes No change in energy level/ hair or skin/ edema and no tremor Lab Results  Component Value Date   TSH 0.75 10/15/2022    Taking levothy 25 mcg daily  Under care of endocrinology        Other   Belching    SIBO may be cause  Pt waiting on 2nd opinion for this      Colon cancer screening    Colonoscopy was 03/2019  5 y recall if she is up for it at that time       RESOLVED: Current use of proton pump inhibitor    No longer on this  Still takes B12 three days weekly   Lab Results  Component Value Date   VITAMINB12 701 10/15/2022        RESOLVED: Elevated ferritin    This is back to normal now        Estrogen deficiency    Due for 2 y dexa  Vit d level is tx      Relevant Orders   DG Bone Density   Hyperlipidemia    Disc goals for lipids and reasons to control them Rev last labs with pt Rev low sat fat diet in detail Is stable   Statin intolerant in the past LDL 114  Diet is good  No red meat at all      Insomnia    Chronic Also a caregiver  Occ takes and antihistamine or pain medication before bed  Uses with caution       Routine general medical examination at a health care facility - Primary    Reviewed health habits including diet and exercise and skin cancer prevention Reviewed appropriate screening tests for age  Also reviewed health mt list, fam hx and immunization status , as well as  social and family history   See HPI Labs reviewed and ordered Mammogram utd 04/2022 Ordered dexa-she will call to schedule No falls or fractures  Following PHQ and caregiver stress Utd derm health  No gyn complaints Colonoscopy utd 03/2019 with 5 y recall if up for it        Vitamin D deficiency    Vitamin D level is therapeutic with current supplementation Disc importance of this to  bone and overall health  Last vitamin D Lab Results  Component Value Date   VD25OH 62.50 10/15/2022

## 2022-10-22 NOTE — Assessment & Plan Note (Signed)
Disc goals for lipids and reasons to control them Rev last labs with pt Rev low sat fat diet in detail Is stable   Statin intolerant in the past LDL 114  Diet is good  No red meat at all

## 2022-10-22 NOTE — Assessment & Plan Note (Signed)
Vitamin D level is therapeutic with current supplementation Disc importance of this to bone and overall health  Last vitamin D Lab Results  Component Value Date   VD25OH 62.50 10/15/2022

## 2022-10-22 NOTE — Assessment & Plan Note (Signed)
This is back to normal now

## 2022-10-22 NOTE — Patient Instructions (Addendum)
In the future  When /if you have time   Add some strength training to your routine, this is important for bone and brain health and can reduce your risk of falls and help your body use insulin properly and regulate weight  Light weights, exercise bands , and internet videos are a good way to start  Yoga (chair or regular), machines , floor exercises or a gym with machines are also good options    Take care of yourself the best you can     Call and schedule your bone density test at [facility]   You have an order for:  []   2D Mammogram  []   3D Mammogram  [x]   Bone Density     Please call for appointment:   []   [facility]  [place] [address]  [phone]  []   [facility] [name])   [address] Room [place], [place] [address]  [phone]  []   [facility]      [address] [place], [place]         [x]   [place], [place]  []  [facility]   [address], [place] [address]  [phone]  []  [facility]  [address] [place], [place] [address] [phone]    Make sure to wear two piece clothing  No lotions powders or deodorants the day of the appointment Make sure to bring picture ID and insurance card.  Bring list of medications you are currently taking including any supplements.   Schedule your screening mammogram through MyChart!   Select [place] imaging sites can now be scheduled through [place].  Log into your MyChart account.  Go to 'Visit' (or 'Appointments' if  on mobile App) --> Schedule an  Appointment  Under 'Select a Reason for Visit' choose the Mammogram  Screening option.  Complete the pre-visit questions  and  select the time and place that  best fits your schedule

## 2022-10-22 NOTE — Assessment & Plan Note (Signed)
Colonoscopy was 03/2019  5 y recall if she is up for it at that time

## 2022-10-22 NOTE — Assessment & Plan Note (Signed)
Reviewed health habits including diet and exercise and skin cancer prevention Reviewed appropriate screening tests for age  Also reviewed health mt list, fam hx and immunization status , as well as social and family history   See HPI Labs reviewed and ordered Mammogram utd 04/2022 Ordered dexa-she will call to schedule No falls or fractures  Following PHQ and caregiver stress Utd derm health  No gyn complaints Colonoscopy utd 03/2019 with 5 y recall if up for it

## 2022-10-22 NOTE — Assessment & Plan Note (Signed)
Due for 2 y dexa  Vit d level is tx

## 2022-10-22 NOTE — Assessment & Plan Note (Signed)
SIBO may be cause  Pt waiting on 2nd opinion for this

## 2022-10-22 NOTE — Assessment & Plan Note (Signed)
Chronic Also a caregiver  Occ takes and antihistamine or pain medication before bed  Uses with caution

## 2022-10-28 NOTE — Telephone Encounter (Signed)
Please check if [facility] is in network for her insurance and send the referral.  Thank you

## 2022-10-28 NOTE — Telephone Encounter (Signed)
Received a call from [facility], stated patient's insurance was OON, and patient would need to be referred to a different facility.

## 2022-10-29 NOTE — Telephone Encounter (Addendum)
Called and informed patient that [facility] is OON with her insurance. I informed patient that we have sent a referral to [facility], they will contact her to set up an appt. This is for 2nd opinion of SIBO, Dr. Lavon Paganini will still be patient's primary GI provider. Pt verbalized understanding and had no concerns at the end of the call.  Referral faxed to [facility] at phone: [phone] [facility]).

## 2022-12-09 DIAGNOSIS — M79672 Pain in left foot: Secondary | ICD-10-CM | POA: Diagnosis not present

## 2022-12-09 DIAGNOSIS — M79671 Pain in right foot: Secondary | ICD-10-CM | POA: Diagnosis not present

## 2022-12-18 ENCOUNTER — Encounter: Payer: Self-pay | Admitting: Dermatology

## 2022-12-18 ENCOUNTER — Ambulatory Visit: Payer: PPO | Admitting: Dermatology

## 2022-12-18 DIAGNOSIS — L578 Other skin changes due to chronic exposure to nonionizing radiation: Secondary | ICD-10-CM | POA: Diagnosis not present

## 2022-12-18 DIAGNOSIS — Z872 Personal history of diseases of the skin and subcutaneous tissue: Secondary | ICD-10-CM

## 2022-12-18 DIAGNOSIS — L309 Dermatitis, unspecified: Secondary | ICD-10-CM

## 2022-12-18 DIAGNOSIS — W908XXA Exposure to other nonionizing radiation, initial encounter: Secondary | ICD-10-CM | POA: Diagnosis not present

## 2022-12-18 DIAGNOSIS — Z5111 Encounter for antineoplastic chemotherapy: Secondary | ICD-10-CM | POA: Diagnosis not present

## 2022-12-18 DIAGNOSIS — L810 Postinflammatory hyperpigmentation: Secondary | ICD-10-CM | POA: Diagnosis not present

## 2022-12-18 DIAGNOSIS — L570 Actinic keratosis: Secondary | ICD-10-CM | POA: Diagnosis not present

## 2022-12-18 DIAGNOSIS — X32XXXA Exposure to sunlight, initial encounter: Secondary | ICD-10-CM | POA: Diagnosis not present

## 2022-12-18 MED ORDER — TACROLIMUS 0.1 % EX OINT: TOPICAL_OINTMENT | Freq: Two times a day (BID) | CUTANEOUS | 1 refills | Status: AC

## 2022-12-18 NOTE — Progress Notes (Signed)
Follow-Up Visit   Subjective  Morgan [name] is a 77 y.o. female who presents for the following: AK and ISK follow up. Treated at left flank and mid forehead with LN2 at last visit. Patient also would like her back checked, c/o itching and a spot at face to check.   Patient treated arms in January with 5FU/calcipotriene and is concerned because the redness is not totally faded.   The following portions of the chart were reviewed this encounter and updated as appropriate: medications, allergies, medical history  Review of Systems:  No other skin or systemic complaints except as noted in HPI or Assessment and Plan.  Objective  Well appearing patient in no apparent distress; mood and affect are within normal limits.   A focused examination was performed of the following areas: Hands, face, back  Relevant exam findings are noted in the Assessment and Plan.  L hand, R hand, L wrist (3) Erythematous thin papules/macules with gritty scale.     Assessment & Plan   HISTORY OF PRECANCEROUS ACTINIC KERATOSIS - site(s) of PreCancerous Actinic Keratosis clear today. - these may recur and new lesions may form requiring treatment to prevent transformation into skin cancer - observe for new or changing spots and contact [facility] for appointment if occur - photoprotection with sun protective clothing; sunglasses and broad spectrum sunscreen with SPF of at least 30 + and frequent self skin exams recommended - yearly exams by a dermatologist recommended for persons with history of PreCancerous Actinic Keratoses.  AK (actinic keratosis) (3) L hand, R hand, L wrist  Actinic keratoses are precancerous spots that appear secondary to cumulative UV radiation exposure/sun exposure over time. They are chronic with expected duration over 1 year. A portion of actinic keratoses will progress to squamous cell carcinoma of the skin. It is not possible to reliably predict which spots will progress  to skin cancer and so treatment is recommended to prevent development of skin cancer.  Recommend daily broad spectrum sunscreen SPF 30+ to sun-exposed areas, reapply every 2 hours as needed.  Recommend staying in the shade or wearing long sleeves, sun glasses (UVA+UVB protection) and wide brim hats (4-inch brim around the entire circumference of the hat). Call for new or changing lesions.  Prior to procedure, discussed risks of blister formation, small wound, skin dyspigmentation, or rare scar following cryotherapy. Recommend Vaseline ointment to treated areas while healing.   Destruction of lesion - L hand, R hand, L wrist  Destruction method: cryotherapy   Informed consent: discussed and consent obtained   Lesion destroyed using liquid nitrogen: Yes   Cryotherapy cycles:  2 Outcome: patient tolerated procedure well with no complications   Post-procedure details: wound care instructions given    POST-INFLAMMATORY ERYTHEMA Exam: erythematous macules and/or patches at arms   This is a benign condition that comes from having previous inflammation in the skin and will fade with time over months to sometimes years. Recommend daily sun protection including sunscreen SPF 30+ to sun-exposed areas. - Recommend treating any itchy or red areas on the skin quickly to prevent new areas of PIH. Treating with prescription medicines such as hydroquinone may help fade dark spots faster.    Treatment Plan:  start tacrolimus twice daily to arms  ACTINIC DAMAGE WITH PRECANCEROUS ACTINIC KERATOSES Counseling for Topical Chemotherapy Management: Patient exhibits: - Severe, confluent actinic changes with pre-cancerous actinic keratoses that is secondary to cumulative UV radiation exposure over time - Condition that is severe; chronic,  not at goal. - diffuse scaly erythematous macules and papules with underlying dyspigmentation - Discussed Prescription "Field Treatment" topical Chemotherapy for Severe,  Chronic Confluent Actinic Changes with Pre-Cancerous Actinic Keratoses Field treatment involves treatment of an entire area of skin that has confluent Actinic Changes (Sun/ Ultraviolet light damage) and PreCancerous Actinic Keratoses by method of PhotoDynamic Therapy (PDT) and/or prescription Topical Chemotherapy agents such as 5-fluorouracil, 5-fluorouracil/calcipotriene, and/or imiquimod.  The purpose is to decrease the number of clinically evident and subclinical PreCancerous lesions to prevent progression to development of skin cancer by chemically destroying early precancer changes that may or may not be visible.  It has been shown to reduce the risk of developing skin cancer in the treated area. As a result of treatment, redness, scaling, crusting, and open sores may occur during treatment course. One or more than one of these methods may be used and may have to be used several times to control, suppress and eliminate the PreCancerous changes. Discussed treatment course, expected reaction, and possible side effects. - Recommend daily broad spectrum sunscreen SPF 30+ to sun-exposed areas, reapply every 2 hours as needed.  - Staying in the shade or wearing long sleeves, sun glasses (UVA+UVB protection) and wide brim hats (4-inch brim around the entire circumference of the hat) are also recommended. - Call for new or changing lesions. - Start 5-fluorouracil/calcipotriene cream twice a day for 4 - 7 days to affected areas including forehead, nose. Patient provided with handout reviewing treatment course and side effects and advised to call or message [place] on MyChart with any concerns.  Reviewed course of treatment and expected reaction.  Patient advised to expect inflammation and crusting and advised that erosions are possible.  Patient advised to be diligent with sun protection during and after treatment. Counseled to keep medication out of reach of children and pets.  Dermatitis  Exam: Scaly pink  papules coalescing to plaques at back  Chronic and persistent condition with duration over one year. Condition is symptomatic/ bothersome to patient. Not currently at goal.  Treatment Plan: Start halobetasol 0.05% cream twice daily to back for up to 2 weeks as needed. Avoid applying to face, groin, and axilla. Use as directed. Long-term use can cause thinning of the skin.  Topical steroids (such as triamcinolone, fluocinolone, fluocinonide, mometasone, clobetasol, halobetasol, betamethasone, hydrocortisone) can cause thinning and lightening of the skin if they are used for too long in the same area. Your physician has selected the right strength medicine for your problem and area affected on the body. Please use your medication only as directed by your physician to prevent side effects.    Return for 9 - 12 months TBSE with Dr. Roseanne Reno.  Anise Salvo, RMA, am acting as scribe for Darden Dates, MD .   Documentation: I have reviewed the above documentation for accuracy and completeness, and I agree with the above.  Darden Dates, MD

## 2022-12-18 NOTE — Patient Instructions (Addendum)
- Start 5-fluorouracil/calcipotriene cream twice a day for 4 - 7 days to affected areas including forehead, nose. Patient provided with handout reviewing treatment course and side effects and advised to call or message [place] on MyChart with any concerns.  5-Fluorouracil/Calcipotriene Patient Education   Actinic keratoses are the dry, red scaly spots on the skin caused by sun damage. A portion of these spots can turn into skin cancer with time, and treating them can help prevent development of skin cancer.   Treatment of these spots requires removal of the defective skin cells. There are various ways to remove actinic keratoses, including freezing with liquid nitrogen, treatment with creams, or treatment with a blue light procedure in the office.   5-fluorouracil cream is a topical cream used to treat actinic keratoses. It works by interfering with the growth of abnormal fast-growing skin cells, such as actinic keratoses. These cells peel off and are replaced by healthy ones. THIS CREAM SHOULD BE KEPT OUT OF REACH OF CHILDREN AND PETS AND SHOULD NOT BE USED BY PREGNANT WOMEN.  5-fluorouracil/calcipotriene is a combination of the 5-fluorouracil cream with a vitamin D analog cream called calcipotriene. The calcipotriene alone does not treat actinic keratoses. However, when it is combined with 5-fluorouracil, it helps the 5-fluorouracil treat the actinic keratoses much faster so that the same results can be achieved with a much shorter treatment time.  INSTRUCTIONS FOR 5-FLUOROURACIL/CALCIPOTRIENE CREAM:   5-fluorouracil/calcipotriene cream typically only needs to be used for 4-7 days. A thin layer should be applied twice a day to the treatment areas recommended by your physician.   If your physician prescribed you separate tubes of 5-fluourouracil and calcipotriene, apply a thin layer of 5-fluorouracil followed by a thin layer of calcipotriene.   Avoid contact with your eyes or nostrils. Avoid applying  the cream to your eyelids or lips unless directed to apply there by your physician. Do not use 5-fluorouracil/calcipotriene cream on infected or open wounds.   You will develop redness, irritation and some crusting at areas where you have pre-cancer damage/actinic keratoses. IF YOU DEVELOP PAIN, BLEEDING, OR SIGNIFICANT CRUSTING, STOP THE TREATMENT EARLY - you have already gotten a good response and the actinic keratoses should clear up well.  Wash your hands after applying 5-fluorouracil 5% cream on your skin.   A moisturizer or sunscreen with a minimum SPF 30 should be applied each morning.   Once you have finished the treatment, you can apply a thin layer of Vaseline twice a day to irritated areas to soothe and calm the areas more quickly. If you experience significant discomfort, contact your physician.  For some patients it is necessary to repeat the treatment for best results.  SIDE EFFECTS: When using 5-fluorouracil/calcipotriene cream, you may have mild irritation, such as redness, dryness, swelling, or a mild burning sensation. This usually resolves within 2 weeks. The more actinic keratoses you have, the more redness and inflammation you can expect during treatment. Eye irritation has been reported rarely. If this occurs, please let us know.   If you have any trouble using this cream, please send [place] a MyChart message or call the office. If you have any other questions about this information, please do not hesitate to ask me before you leave the office or contact me on MyChart or by phone.  Start halobetasol 0.05% cream twice daily to back for up to 2 weeks as needed. Avoid applying to face, groin, and axilla. Use as directed. Long-term use can cause thinning of the  skin.  Topical steroids (such as triamcinolone, fluocinolone, fluocinonide, mometasone, clobetasol, halobetasol, betamethasone, hydrocortisone) can cause thinning and lightening of the skin if they are used for too long in the  same area. Your physician has selected the right strength medicine for your problem and area affected on the body. Please use your medication only as directed by your physician to prevent side effects.   Recommend OTC Gold Bond Rapid Relief Anti-Itch cream (pramoxine + menthol), CeraVe Anti-itch cream or lotion (pramoxine), Sarna lotion (Original- menthol + camphor or Sensitive- pramoxine) or Eucerin 12 hour Itch Relief lotion (menthol) up to 3 times per day to areas on body that are itchy.  Due to recent changes in healthcare laws, you may see results of your pathology and/or laboratory studies on MyChart before the doctors have had a chance to review them. We understand that in some cases there may be results that are confusing or concerning to you. Please understand that not all results are received at the same time and often the doctors may need to interpret multiple results in order to provide you with the best plan of care or course of treatment. Therefore, we ask that you please give [place] 2 business days to thoroughly review all your results before contacting the office for clarification. Should we see a critical lab result, you will be contacted sooner.   If You Need Anything After Your Visit  If you have any questions or concerns for your doctor, please call our main line at [phone] and press option 4 to reach your doctor's medical assistant. If no one answers, please leave a voicemail as directed and we will return your call as soon as possible. Messages left after 4 pm will be answered the following business day.   You may also send [place] a message via MyChart. We typically respond to MyChart messages within 1-2 business days.  For prescription refills, please ask your pharmacy to contact our office. Our fax number is [phone].  If you have an urgent issue when the clinic is closed that cannot wait until the next business day, you can page your doctor at the number below.    Please  note that while we do our best to be available for urgent issues outside of office hours, we are not available 24/7.   If you have an urgent issue and are unable to reach [place], you may choose to seek medical care at your doctor's office, retail clinic, urgent care center, or emergency room.  If you have a medical emergency, please immediately call 911 or go to the emergency department.  Pager Numbers  - Dr. Gwen Pounds: [phone]  - Dr. Neale Burly: [phone]  - Dr. Roseanne Reno: [phone]  In the event of inclement weather, please call our main line at [phone] for an update on the status of any delays or closures.  Dermatology Medication Tips: Please keep the boxes that topical medications come in in order to help keep track of the instructions about where and how to use these. Pharmacies typically print the medication instructions only on the boxes and not directly on the medication tubes.   If your medication is too expensive, please contact our office at [phone] option 4 or send [place] a message through MyChart.   We are unable to tell what your co-pay for medications will be in advance as this is different depending on your insurance coverage. However, we may be able to find a substitute medication at lower cost or fill out paperwork  to get insurance to cover a needed medication.   If a prior authorization is required to get your medication covered by your insurance company, please allow [place] 1-2 business days to complete this process.  Drug prices often vary depending on where the prescription is filled and some pharmacies may offer cheaper prices.  The website www.goodrx.com contains coupons for medications through different pharmacies. The prices here do not account for what the cost may be with help from insurance (it may be cheaper with your insurance), but the website can give you the price if you did not use any insurance.  - You can print the associated coupon and take it with  your prescription to the pharmacy.  - You may also stop by our office during regular business hours and pick up a GoodRx coupon card.  - If you need your prescription sent electronically to a different pharmacy, notify our office through [facility] or by phone at [phone] option 4.

## 2022-12-30 ENCOUNTER — Other Ambulatory Visit: Payer: Self-pay | Admitting: Dermatology

## 2022-12-30 DIAGNOSIS — L219 Seborrheic dermatitis, unspecified: Secondary | ICD-10-CM

## 2022-12-31 DIAGNOSIS — K5900 Constipation, unspecified: Secondary | ICD-10-CM | POA: Diagnosis not present

## 2022-12-31 DIAGNOSIS — R142 Eructation: Secondary | ICD-10-CM | POA: Diagnosis not present

## 2022-12-31 DIAGNOSIS — K638219 Small intestinal bacterial overgrowth, unspecified: Secondary | ICD-10-CM | POA: Diagnosis not present

## 2022-12-31 DIAGNOSIS — R634 Abnormal weight loss: Secondary | ICD-10-CM | POA: Diagnosis not present

## 2022-12-31 DIAGNOSIS — R140 Abdominal distension (gaseous): Secondary | ICD-10-CM | POA: Diagnosis not present

## 2022-12-31 DIAGNOSIS — R1084 Generalized abdominal pain: Secondary | ICD-10-CM | POA: Diagnosis not present

## 2023-01-06 ENCOUNTER — Encounter: Payer: Self-pay | Admitting: Family Medicine

## 2023-01-06 ENCOUNTER — Ambulatory Visit (INDEPENDENT_AMBULATORY_CARE_PROVIDER_SITE_OTHER): Payer: PPO | Admitting: Family Medicine

## 2023-01-06 VITALS — BP 126/84 | HR 85 | Temp 98.0°F | Ht 64.0 in | Wt 148.0 lb

## 2023-01-06 DIAGNOSIS — E039 Hypothyroidism, unspecified: Secondary | ICD-10-CM

## 2023-01-06 DIAGNOSIS — E042 Nontoxic multinodular goiter: Secondary | ICD-10-CM

## 2023-01-06 NOTE — Assessment & Plan Note (Signed)
Unsure when last [place] was  Last FNA was neg for malignancy but noted findings or thyroiditis (hashimotos)  Gets thyroid US yearly  Dr Patrecia Pace retiring   Will find out when next one is due and order in [place]   No clinical changes (baseline thin hair, this has not changed)

## 2023-01-06 NOTE — Assessment & Plan Note (Signed)
Pt's endocrinologist Dr Patrecia Pace is retiring  H/o hypothyroidism with hashimoto's thyroiditis and goiter/nodules  She takes 25 mcg levothyroxine daily  Would like to hold it and see if really needed Clinically doing well  Reviewed last notes from endocrinology and labs and [place] (though some images were hard to read)   Plan to hold low dose levothyroxine  Re check TSH in 2 months   She will call with  date of last [place] and plan to re check in a year

## 2023-01-06 NOTE — Patient Instructions (Addendum)
Go ahead and hold the levothyroxine 25 mcg   Let's re check labs in 2 months and make a plan from there   Find out for me please when you are due for your next thyroid ultrasound    Take care of yourself

## 2023-01-06 NOTE — Progress Notes (Signed)
Subjective:    Patient ID: Morgan [name], female    DOB: [birth year], 77 y.o.   MRN: [id number]  HPI  Pt presents to discuss endocrinology issues    Wt Readings from Last 3 Encounters:  01/06/23 148 lb (67.1 kg)  10/22/22 146 lb 6 oz (66.4 kg)  08/27/22 148 lb 8 oz (67.4 kg)   25.40 kg/m  Vitals:   01/06/23 1503  BP: 126/84  Pulse: 85  Temp: 98 F (36.7 C)  SpO2: 90%     H/o hypothyroidism  Her endocrinologist Dr Patrecia Pace retiring and closing his office   Lab Results  Component Value Date   TSH 0.75 10/15/2022   Takes levothyroxine 25 mcg daily  She would like to get off of it     Wants to stop med for a while and then re check   H/o thyroid nodules / multi nodular goiter  Hashimoto's thyroiditis  FNA neg 2013  FNA neg 2020 - no cancer but positive for signs of thyroiditis (reports are in chart but poor quality and hard to read)   Had ultrasound once a year   She will check and see when she is due for her next ultrasound   Never had thyroid surgery No h/o RI treatments   Takes D3  Takes B12 Last vitamin D Lab Results  Component Value Date   VD25OH 62.50 10/15/2022   Lab Results  Component Value Date   VITAMINB12 701 10/15/2022      Patient Active Problem List   Diagnosis Date Noted   Estrogen deficiency 10/22/2022   Pre-syncope 11/30/2021   Insomnia 11/30/2021   Bradycardia 11/30/2021   Fasting hypoglycemia 10/03/2021   S/P laparoscopic cholecystectomy 08/22/2021   Idiopathic small fiber sensory neuropathy 04/12/2020   Belching 02/09/2020   Vitamin D deficiency 10/05/2019   Colon cancer screening 02/09/2019   Paresthesia of foot, bilateral 08/12/2016   Encounter for Medicare annual wellness exam 04/13/2014   Allergic rhinitis 04/13/2014   Routine general medical examination at a health care facility 04/07/2014   Multiple thyroid nodules 01/24/2014   History of small bowel obstruction 01/20/2014   DDD (degenerative disc disease),  cervical    DDD (degenerative disc disease), lumbar    Spinal stenosis    TINNITUS 06/08/2010   Hypothyroidism 04/18/2010   GERD 05/03/2008   DIVERTICULOSIS OF COLON 04/26/2008   HEPATITIS, HX OF 04/26/2008   COLONIC POLYPS, HYPERPLASTIC, HX OF 04/26/2008   HEMORRHOIDS, INTERNAL 01/11/2008   DERMATITIS, SEBORRHEIC NEC 01/28/2007   Hyperlipidemia 01/19/2007   IBS 01/19/2007   OVERACTIVE BLADDER 01/19/2007   POSTMENOPAUSAL STATUS 01/19/2007   ECZEMA 01/19/2007   Osteoarthritis 01/19/2007   Past Medical History:  Diagnosis Date   Allergy    Bradycardia    Collagen vascular disease (HCC)    Connective tissue disease (HCC)    COVID-19    DDD (degenerative disc disease), cervical    DDD (degenerative disc disease), lumbar    Endometrial polyp    not resolved with D&C (following)   GERD (gastroesophageal reflux disease)    gas/belching issues-was on protonix for short time-not since 6 months   HLD (hyperlipidemia)    Mixed connective tissue disease (HCC) diagnosed 5 years ago   no problems since diagnosis   Multiple thyroid nodules 01/24/2014   Obesity    Osteoarthritis of cervical spine    Renal calculi    Spinal stenosis    Past Surgical History:  Procedure Laterality Date   APPENDECTOMY  CHOLECYSTECTOMY N/A 08/22/2021   Procedure: LAPAROSCOPIC CHOLECYSTECTOMY;  Surgeon: Abigail Miyamoto, MD;  Location: [facility];  Service: General;  Laterality: N/A;   COLONOSCOPY     DILATION AND CURETTAGE OF UTERUS     fibroids   DILATION AND CURETTAGE OF UTERUS  01/2005   endometrial polyp   ENDOMETRIAL BIOPSY  2008   fibroid   ENDOSCOPIC RETROGRADE CHOLANGIOPANCREATOGRAPHY (ERCP) WITH PROPOFOL N/A 01/21/2022   Procedure: ENDOSCOPIC RETROGRADE CHOLANGIOPANCREATOGRAPHY (ERCP) WITH PROPOFOL;  Surgeon: Iva Boop, MD;  Location: [facility] ENDOSCOPY;  Service: Gastroenterology;  Laterality: N/A;   HAND SURGERY     carpal tunnel release bilateral Dr. Teressa Senter    hysteroscopic resection      polyps of PMB   REMOVAL OF STONES  01/21/2022   Procedure: REMOVAL OF STONES;  Surgeon: Iva Boop, MD;  Location: [facility] ENDOSCOPY;  Service: Gastroenterology;;   Dennison Mascot  01/21/2022   Procedure: SPHINCTEROTOMY;  Surgeon: Iva Boop, MD;  Location: [facility] ENDOSCOPY;  Service: Gastroenterology;;   TONSILLECTOMY     TUBAL LIGATION     1973   UPPER GASTROINTESTINAL ENDOSCOPY     Social History   Tobacco Use   Smoking status: Former    Types: Cigarettes    Quit date: 07/22/1965    Years since quitting: 57.4   Smokeless tobacco: Never   Tobacco comments:    Smoked for 2 years as a teen  Advertising account planner   Vaping Use: Never used  Substance Use Topics   Alcohol use: No   Drug use: No   Family History  Problem Relation Age of Onset   Dementia Mother    Coronary artery disease Father    Heart attack Father        deceased at 72   Diabetes Maternal Grandmother    Goiter Maternal Grandmother    Cancer Grandchild        rare type (dying at age 35)   Diabetes Cousin        multiple   Colon cancer Neg Hx    Esophageal cancer Neg Hx    Stomach cancer Neg Hx    Rectal cancer Neg Hx    Colon polyps Neg Hx    Allergies  Allergen Reactions   Codeine Nausea And Vomiting   Hydrocodone-Acetaminophen     REACTION: reaction not known   Penicillins Hives   Current Outpatient Medications on File Prior to Visit  Medication Sig Dispense Refill   Cholecalciferol (VITAMIN D3) 25 MCG (1000 UT) CAPS Take 2,000 Units by mouth daily in the afternoon.     clobetasol (OLUX) 0.05 % topical foam Apply to aa's rash BID PRN. Avoid applying to face, groin, and axilla. Use as directed. Long-term use can cause thinning of the skin. 60 g 5   Cyanocobalamin (VITAMIN B12) 1000 MCG TBCR Take 1,000 mcg by mouth every Monday, Wednesday, and Friday.     hydrocortisone 2.5 % cream twice daily to crease of nose up to 2 weeks as needed. 20 g 1   hydrocortisone valerate cream  (WESTCORT) 0.2 % APPLY TOPICALLY TWICE DAILY 15 g 2   ketoconazole (NIZORAL) 2 % shampoo SHAMPOO INTO THE SCALP. LET SIT 5-10 MINUTES THEN WASH OUT. USE 3 DAYS PER WEEK 120 mL 3   levothyroxine (SYNTHROID) 25 MCG tablet Take 25 mcg by mouth daily before breakfast.     polyethylene glycol powder (GLYCOLAX/MIRALAX) 17 GM/SCOOP powder Take 17 g by mouth as needed for mild constipation or moderate  constipation.     tacrolimus (PROTOPIC) 0.1 % ointment Apply topically 2 (two) times daily. To arms 60 g 1   valACYclovir (VALTREX) 1000 MG tablet TAKE 2 TABLET BY MOUTH AT FIRST ONSET OFSYMPTOMS OF COLD SORES, THEN TAKE 2 TABLETS BY MOUTH 12 HOURS LATER (Patient taking differently: Take 1,000 mg by mouth See admin instructions. TAKE 2 TABLET BY MOUTH AT FIRST ONSET OFSYMPTOMS OF COLD SORES, THEN TAKE 2 TABLETS BY MOUTH 12 HOURS LATER as needed) 30 tablet 10   famotidine (PEPCID) 20 MG tablet Take 1 tablet (20 mg total) by mouth at bedtime. (Patient not taking: Reported on 01/06/2023) 30 tablet 2   halobetasol (ULTRAVATE) 0.05 % cream APPLY TOPICALLY 2 TIMES DAILY AS NEEDED FOR UP TO 14 DAYS. AVOID FACE, GROIN ANDAXILLA (Patient not taking: Reported on 01/06/2023) 30 g 0   triamcinolone cream (KENALOG) 0.1 % Apply 1 Application topically daily as needed (out- break on skin). (Patient not taking: Reported on 01/06/2023)     triamcinolone ointment (KENALOG) 0.1 % APPLY TWICE DAILY FOR UP TO 2 WEEKS FOR "COOL DOWN". AVOID APPLYING TO FACE, GROIN, AND AXILLA. USE AS DIRECTED. LONGTERM USE CAN CAUSE THINNING OF THE SKIN (Patient not taking: Reported on 01/06/2023) 80 g 0   No current facility-administered medications on file prior to visit.    Review of Systems  Constitutional:  Negative for activity change, appetite change, fatigue, fever and unexpected weight change.  HENT:  Negative for congestion, ear pain, rhinorrhea, sinus pressure and sore throat.   Eyes:  Negative for pain, redness and visual disturbance.   Respiratory:  Negative for cough, shortness of breath and wheezing.   Cardiovascular:  Negative for chest pain and palpitations.  Gastrointestinal:  Negative for abdominal pain, blood in stool, constipation and diarrhea.  Endocrine: Negative for polydipsia and polyuria.  Genitourinary:  Negative for dysuria, frequency and urgency.  Musculoskeletal:  Negative for arthralgias, back pain and myalgias.  Skin:  Negative for pallor and rash.  Allergic/Immunologic: Negative for environmental allergies.  Neurological:  Negative for dizziness, syncope and headaches.  Hematological:  Negative for adenopathy. Does not bruise/bleed easily.  Psychiatric/Behavioral:  Negative for decreased concentration and dysphoric mood. The patient is not nervous/anxious.        Objective:   Physical Exam Constitutional:      General: She is not in acute distress.    Appearance: Normal appearance. She is well-developed and normal weight. She is not ill-appearing or diaphoretic.  HENT:     Head: Normocephalic and atraumatic.  Eyes:     Conjunctiva/sclera: Conjunctivae normal.     Pupils: Pupils are equal, round, and reactive to light.  Neck:     Thyroid: No thyromegaly.     Vascular: No carotid bruit or JVD.     Comments: Did not appreciate thyroid enlargement or asymmetry today Cardiovascular:     Rate and Rhythm: Normal rate and regular rhythm.     Heart sounds: Normal heart sounds.     No gallop.  Pulmonary:     Effort: Pulmonary effort is normal. No respiratory distress.     Breath sounds: Normal breath sounds. No wheezing or rales.  Abdominal:     General: There is no distension or abdominal bruit.     Palpations: Abdomen is soft.  Musculoskeletal:     Cervical back: Normal range of motion and neck supple.     Right lower leg: No edema.     Left lower leg: No edema.  Lymphadenopathy:  Cervical: No cervical adenopathy.  Skin:    General: Skin is warm and dry.     Coloration: Skin is not  pale.     Findings: No rash.  Neurological:     Mental Status: She is alert.     Cranial Nerves: No cranial nerve deficit.     Motor: No weakness.     Coordination: Coordination normal.     Deep Tendon Reflexes: Reflexes are normal and symmetric. Reflexes normal.     Comments: No tremor   Psychiatric:        Mood and Affect: Mood normal.           Assessment & Plan:   Problem List Items Addressed This Visit       Endocrine   Multiple thyroid nodules    Unsure when last [place] was  Last FNA was neg for malignancy but noted findings or thyroiditis (hashimotos)  Gets thyroid US yearly  Dr Patrecia Pace retiring   Will find out when next one is due and order in [place]   No clinical changes (baseline thin hair, this has not changed)       Hypothyroidism - Primary    Pt's endocrinologist Dr Patrecia Pace is retiring  H/o hypothyroidism with hashimoto's thyroiditis and goiter/nodules  She takes 25 mcg levothyroxine daily  Would like to hold it and see if really needed Clinically doing well  Reviewed last notes from endocrinology and labs and [place] (though some images were hard to read)   Plan to hold low dose levothyroxine  Re check TSH in 2 months   She will call with  date of last [place] and plan to re check in a year

## 2023-01-07 ENCOUNTER — Telehealth: Payer: Self-pay | Admitting: Family Medicine

## 2023-01-07 NOTE — Telephone Encounter (Signed)
Patient called in to let Dr Milinda Antis know that she had her last thyroid US on 03/2022. Dr Milinda Antis told her to call back and let her know once she found out.

## 2023-01-07 NOTE — Telephone Encounter (Signed)
Called patient reviewed all information and repeated back to me. Will call if any questions. She will give office a call in September.

## 2023-01-07 NOTE — Telephone Encounter (Signed)
Great - please call in September and I will order another one at imaging center of choice  Thanks for the heads up

## 2023-01-14 ENCOUNTER — Other Ambulatory Visit: Payer: Self-pay | Admitting: Dermatology

## 2023-01-14 DIAGNOSIS — B009 Herpesviral infection, unspecified: Secondary | ICD-10-CM

## 2023-01-14 DIAGNOSIS — M79673 Pain in unspecified foot: Secondary | ICD-10-CM | POA: Diagnosis not present

## 2023-01-14 DIAGNOSIS — M5416 Radiculopathy, lumbar region: Secondary | ICD-10-CM | POA: Diagnosis not present

## 2023-01-14 DIAGNOSIS — M545 Low back pain, unspecified: Secondary | ICD-10-CM | POA: Diagnosis not present

## 2023-01-14 DIAGNOSIS — M9903 Segmental and somatic dysfunction of lumbar region: Secondary | ICD-10-CM | POA: Diagnosis not present

## 2023-01-15 DIAGNOSIS — K2100 Gastro-esophageal reflux disease with esophagitis, without bleeding: Secondary | ICD-10-CM | POA: Diagnosis not present

## 2023-01-15 DIAGNOSIS — K9289 Other specified diseases of the digestive system: Secondary | ICD-10-CM | POA: Diagnosis not present

## 2023-01-15 DIAGNOSIS — K589 Irritable bowel syndrome without diarrhea: Secondary | ICD-10-CM | POA: Diagnosis not present

## 2023-01-15 DIAGNOSIS — R142 Eructation: Secondary | ICD-10-CM | POA: Diagnosis not present

## 2023-01-20 NOTE — Patient Instructions (Incomplete)
Morgan [name] , Thank you for taking time to come for your Medicare Wellness Visit. I appreciate your ongoing commitment to your health goals. Please review the following plan we discussed and let me know if I can assist you in the future.   These are the goals we discussed:  Goals      manage back pain     Starting 12/04/2016, I will continue to take pain medication and to use other pain relieving remedies as needed to manage back pain.      Patient Stated     Starting 01/06/2018, I will continue to take medications as prescribed.      Patient Stated     Would like to maintain current routine        This is a list of the screening recommended for you and due dates:  Health Maintenance  Topic Date Due   Medicare Annual Wellness Visit  09/27/2022   COVID-19 Vaccine (1) 01/22/2023*   Mammogram  05/22/2023   Colon Cancer Screening  04/12/2024   DTaP/Tdap/Td vaccine (4 - Td or Tdap) 05/29/2027   Pneumonia Vaccine  Completed   DEXA scan (bone density measurement)  Completed   Hepatitis C Screening  Completed   Zoster (Shingles) Vaccine  Completed   HPV Vaccine  Aged Out  *Topic was postponed. The date shown is not the original due date.    Advanced directives: Information on Advanced Care Planning can be found at [facility] of [facility] Advance Health Care Directives Advance Health Care Directives ([email or web address] Please bring a copy of your health care power of attorney and living will to the office to be added to your chart at your convenience.  Conditions/risks identified: Aim for 30 minutes of exercise or brisk walking, 6-8 glasses of water, and 5 servings of fruits and vegetables each day.  Next appointment: Follow up in one year for your annual wellness visit    Preventive Care 65 Years and Older, Female Preventive care refers to lifestyle choices and visits with your health care provider that can promote health and wellness. What does preventive care include? A yearly  physical exam. This is also called an annual well check. Dental exams once or twice a year. Routine eye exams. Ask your health care provider how often you should have your eyes checked. Personal lifestyle choices, including: Daily care of your teeth and gums. Regular physical activity. Eating a healthy diet. Avoiding tobacco and drug use. Limiting alcohol use. Practicing safe sex. Taking low-dose aspirin every day. Taking vitamin and mineral supplements as recommended by your health care provider. What happens during an annual well check? The services and screenings done by your health care provider during your annual well check will depend on your age, overall health, lifestyle risk factors, and family history of disease. Counseling  Your health care provider may ask you questions about your: Alcohol use. Tobacco use. Drug use. Emotional well-being. Home and relationship well-being. Sexual activity. Eating habits. History of falls. Memory and ability to understand (cognition). Work and work Astronomer. Reproductive health. Screening  You may have the following tests or measurements: Height, weight, and BMI. Blood pressure. Lipid and cholesterol levels. These may be checked every 5 years, or more frequently if you are over 3 years old. Skin check. Lung cancer screening. You may have this screening every year starting at age 41 if you have a 30-pack-year history of smoking and currently smoke or have quit within the past 15 years. Fecal occult blood  test (FOBT) of the stool. You may have this test every year starting at age 17. Flexible sigmoidoscopy or colonoscopy. You may have a sigmoidoscopy every 5 years or a colonoscopy every 10 years starting at age 19. Hepatitis C blood test. Hepatitis B blood test. Sexually transmitted disease (STD) testing. Diabetes screening. This is done by checking your blood sugar (glucose) after you have not eaten for a while (fasting). You may  have this done every 1-3 years. Bone density scan. This is done to screen for osteoporosis. You may have this done starting at age 92. Mammogram. This may be done every 1-2 years. Talk to your health care provider about how often you should have regular mammograms. Talk with your health care provider about your test results, treatment options, and if necessary, the need for more tests. Vaccines  Your health care provider may recommend certain vaccines, such as: Influenza vaccine. This is recommended every year. Tetanus, diphtheria, and acellular pertussis (Tdap, Td) vaccine. You may need a Td booster every 10 years. Zoster vaccine. You may need this after age 85. Pneumococcal 13-valent conjugate (PCV13) vaccine. One dose is recommended after age 22. Pneumococcal polysaccharide (PPSV23) vaccine. One dose is recommended after age 63. Talk to your health care provider about which screenings and vaccines you need and how often you need them. This information is not intended to replace advice given to you by your health care provider. Make sure you discuss any questions you have with your health care provider. Document Released: 08/04/2015 Document Revised: 03/27/2016 Document Reviewed: 05/09/2015 Elsevier Interactive Patient Education  2017 [place] Prevention in the Home Falls can cause injuries. They can happen to people of all ages. There are many things you can do to make your home safe and to help prevent falls. What can I do on the outside of my home? Regularly fix the edges of walkways and driveways and fix any cracks. Remove anything that might make you trip as you walk through a door, such as a raised step or threshold. Trim any bushes or trees on the path to your home. Use bright outdoor lighting. Clear any walking paths of anything that might make someone trip, such as rocks or tools. Regularly check to see if handrails are loose or broken. Make sure that both sides of any  steps have handrails. Any raised decks and porches should have guardrails on the edges. Have any leaves, snow, or ice cleared regularly. Use sand or salt on walking paths during winter. Clean up any spills in your garage right away. This includes oil or grease spills. What can I do in the bathroom? Use night lights. Install grab bars by the toilet and in the tub and shower. Do not use towel bars as grab bars. Use non-skid mats or decals in the tub or shower. If you need to sit down in the shower, use a plastic, non-slip stool. Keep the floor dry. Clean up any water that spills on the floor as soon as it happens. Remove soap buildup in the tub or shower regularly. Attach bath mats securely with double-sided non-slip rug tape. Do not have throw rugs and other things on the floor that can make you trip. What can I do in the bedroom? Use night lights. Make sure that you have a light by your bed that is easy to reach. Do not use any sheets or blankets that are too big for your bed. They should not hang down onto the floor. Have  a firm chair that has side arms. You can use this for support while you get dressed. Do not have throw rugs and other things on the floor that can make you trip. What can I do in the kitchen? Clean up any spills right away. Avoid walking on wet floors. Keep items that you use a lot in easy-to-reach places. If you need to reach something above you, use a strong step stool that has a grab bar. Keep electrical cords out of the way. Do not use floor polish or wax that makes floors slippery. If you must use wax, use non-skid floor wax. Do not have throw rugs and other things on the floor that can make you trip. What can I do with my stairs? Do not leave any items on the stairs. Make sure that there are handrails on both sides of the stairs and use them. Fix handrails that are broken or loose. Make sure that handrails are as long as the stairways. Check any carpeting to  make sure that it is firmly attached to the stairs. Fix any carpet that is loose or worn. Avoid having throw rugs at the top or bottom of the stairs. If you do have throw rugs, attach them to the floor with carpet tape. Make sure that you have a light switch at the top of the stairs and the bottom of the stairs. If you do not have them, ask someone to add them for you. What else can I do to help prevent falls? Wear shoes that: Do not have high heels. Have rubber bottoms. Are comfortable and fit you well. Are closed at the toe. Do not wear sandals. If you use a stepladder: Make sure that it is fully opened. Do not climb a closed stepladder. Make sure that both sides of the stepladder are locked into place. Ask someone to hold it for you, if possible. Clearly mark and make sure that you can see: Any grab bars or handrails. First and last steps. Where the edge of each step is. Use tools that help you move around (mobility aids) if they are needed. These include: Canes. Walkers. Scooters. Crutches. Turn on the lights when you go into a dark area. Replace any light bulbs as soon as they burn out. Set up your furniture so you have a clear path. Avoid moving your furniture around. If any of your floors are uneven, fix them. If there are any pets around you, be aware of where they are. Review your medicines with your doctor. Some medicines can make you feel dizzy. This can increase your chance of falling. Ask your doctor what other things that you can do to help prevent falls. This information is not intended to replace advice given to you by your health care provider. Make sure you discuss any questions you have with your health care provider. Document Released: 05/04/2009 Document Revised: 12/14/2015 Document Reviewed: 08/12/2014 Elsevier Interactive Patient Education  2017 [employer].

## 2023-01-20 NOTE — Progress Notes (Unsigned)
Subjective:   Morgan [name] is a 77 y.o. female who presents for Medicare Annual (Subsequent) preventive examination.  Visit Complete: {VISITMETHOD:[phone]}  Patient Medicare AWV questionnaire was completed by the patient on ***; I have confirmed that all information answered by patient is correct and no changes since this date.  Review of Systems    ***       Objective:    There were no vitals filed for this visit. There is no height or weight on file to calculate BMI.     01/21/2022    7:16 AM 11/24/2021    1:22 PM 09/26/2021   11:19 AM 09/18/2021    2:20 PM 08/22/2021   10:22 AM 08/13/2021    9:29 AM 02/06/2020   11:50 PM  Advanced Directives  Does Patient Have a Medical Advance Directive? Yes No Yes Yes  Yes No  Type of Estate agent of [place];Living will  Healthcare Power of [place];Living will Living will Healthcare Power of [place];Living will Healthcare Power of [place];Living will   Does patient want to make changes to medical advance directive?   Yes (MAU/Ambulatory/Procedural Areas - Information given)  No - Patient declined No - Patient declined   Copy of Healthcare Power of Attorney in Chart? Yes - validated most recent copy scanned in chart (See row information)    Yes - validated most recent copy scanned in chart (See row information)    Would patient like information on creating a medical advance directive?       Yes (ED - Information included in AVS)    Current Medications (verified) Outpatient Encounter Medications as of 01/21/2023  Medication Sig   Cholecalciferol (VITAMIN D3) 25 MCG (1000 UT) CAPS Take 2,000 Units by mouth daily in the afternoon.   clobetasol (OLUX) 0.05 % topical foam Apply to aa's rash BID PRN. Avoid applying to face, groin, and axilla. Use as directed. Long-term use can cause thinning of the skin.   Cyanocobalamin (VITAMIN B12) 1000 MCG TBCR Take 1,000 mcg by mouth every Monday, Wednesday, and Friday.   famotidine  (PEPCID) 20 MG tablet Take 1 tablet (20 mg total) by mouth at bedtime. (Patient not taking: Reported on 01/06/2023)   halobetasol (ULTRAVATE) 0.05 % cream APPLY TOPICALLY 2 TIMES DAILY AS NEEDED FOR UP TO 14 DAYS. AVOID FACE, GROIN ANDAXILLA (Patient not taking: Reported on 01/06/2023)   hydrocortisone 2.5 % cream twice daily to crease of nose up to 2 weeks as needed.   hydrocortisone valerate cream (WESTCORT) 0.2 % APPLY TOPICALLY TWICE DAILY   ketoconazole (NIZORAL) 2 % shampoo SHAMPOO INTO THE SCALP. LET SIT 5-10 MINUTES THEN WASH OUT. USE 3 DAYS PER WEEK   levothyroxine (SYNTHROID) 25 MCG tablet Take 25 mcg by mouth daily before breakfast.   polyethylene glycol powder (GLYCOLAX/MIRALAX) 17 GM/SCOOP powder Take 17 g by mouth as needed for mild constipation or moderate constipation.   tacrolimus (PROTOPIC) 0.1 % ointment Apply topically 2 (two) times daily. To arms   triamcinolone cream (KENALOG) 0.1 % Apply 1 Application topically daily as needed (out- break on skin). (Patient not taking: Reported on 01/06/2023)   triamcinolone ointment (KENALOG) 0.1 % APPLY TWICE DAILY FOR UP TO 2 WEEKS FOR "COOL DOWN". AVOID APPLYING TO FACE, GROIN, AND AXILLA. USE AS DIRECTED. LONGTERM USE CAN CAUSE THINNING OF THE SKIN (Patient not taking: Reported on 01/06/2023)   valACYclovir (VALTREX) 1000 MG tablet TAKE TWO TABS BY MOUTH AT FIRST ONSET OF SYMPTOMS OF COLD SORES, THEN  TAKE TWO TABLETS BY MOUTH 12 HOURS LATER   No facility-administered encounter medications on file as of 01/21/2023.    Allergies (verified) Codeine, Hydrocodone-acetaminophen, and Penicillins   History: Past Medical History:  Diagnosis Date   Allergy    Bradycardia    Collagen vascular disease (HCC)    Connective tissue disease (HCC)    COVID-19    DDD (degenerative disc disease), cervical    DDD (degenerative disc disease), lumbar    Endometrial polyp    not resolved with D&C (following)   GERD (gastroesophageal reflux disease)     gas/belching issues-was on protonix for short time-not since 6 months   HLD (hyperlipidemia)    Mixed connective tissue disease (HCC) diagnosed 5 years ago   no problems since diagnosis   Multiple thyroid nodules 01/24/2014   Obesity    Osteoarthritis of cervical spine    Renal calculi    Spinal stenosis    Past Surgical History:  Procedure Laterality Date   APPENDECTOMY     CHOLECYSTECTOMY N/A 08/22/2021   Procedure: LAPAROSCOPIC CHOLECYSTECTOMY;  Surgeon: Abigail Miyamoto, MD;  Location:  SURGERY CENTER;  Service: General;  Laterality: N/A;   COLONOSCOPY     DILATION AND CURETTAGE OF UTERUS     fibroids   DILATION AND CURETTAGE OF UTERUS  01/2005   endometrial polyp   ENDOMETRIAL BIOPSY  2008   fibroid   ENDOSCOPIC RETROGRADE CHOLANGIOPANCREATOGRAPHY (ERCP) WITH PROPOFOL N/A 01/21/2022   Procedure: ENDOSCOPIC RETROGRADE CHOLANGIOPANCREATOGRAPHY (ERCP) WITH PROPOFOL;  Surgeon: Iva Boop, MD;  Location[facility] ENDOSCOPY;  Service: Gastroenterology;  Laterality: N/A;   HAND SURGERY     carpal tunnel release bilateral Dr. Teressa Senter   hysteroscopic resection      polyps of PMB   REMOVAL OF STONES  01/21/2022   Procedure: REMOVAL OF STONES;  Surgeon: Iva Boop, MD;  Location: [facility] ENDOSCOPY;  Service: Gastroenterology;;   Dennison Mascot  01/21/2022   Procedure: Dennison Mascot;  Surgeon: Iva Boop, MD;  Location: [facility] ENDOSCOPY;  Service: Gastroenterology;;   TONSILLECTOMY     TUBAL LIGATION     1973   UPPER GASTROINTESTINAL ENDOSCOPY     Family History  Problem Relation Age of Onset   Dementia Mother    Coronary artery disease Father    Heart attack Father        deceased at 22   Diabetes Maternal Grandmother    Goiter Maternal Grandmother    Cancer Grandchild        rare type (dying at age 12)   Diabetes Cousin        multiple   Colon cancer Neg Hx    Esophageal cancer Neg Hx    Stomach cancer Neg Hx    Rectal cancer Neg Hx    Colon polyps Neg Hx    Social  History   Socioeconomic History   Marital status: Married    Spouse name: Not on file   Number of children: 4   Years of education: HS   Highest education level: Not on file  Occupational History   Occupation: Retired   Tobacco Use   Smoking status: Former    Types: Cigarettes    Quit date: 07/22/1965    Years since quitting: 57.5   Smokeless tobacco: Never   Tobacco comments:    Smoked for 2 years as a teen  Advertising account planner   Vaping Use: Never used  Substance and Sexual Activity   Alcohol use: No   Drug  use: No   Sexual activity: Yes    Birth control/protection: Surgical    Comment: BTL  Other Topics Concern   Not on file  Social History Narrative   + caffeine use     Social Determinants of Health   Financial Resource Strain: Low Risk  (09/26/2021)   Overall Financial Resource Strain (CARDIA)    Difficulty of Paying Living Expenses: Not hard at all  Food Insecurity: No Food Insecurity (09/26/2021)   Hunger Vital Sign    Worried About Running Out of Food in the Last Year: Never true    Ran Out of Food in the Last Year: Never true  Transportation Needs: No Transportation Needs (09/26/2021)   PRAPARE - Administrator, Civil Service (Medical): No    Lack of Transportation (Non-Medical): No  Physical Activity: Inactive (09/26/2021)   Exercise Vital Sign    Days of Exercise per Week: 0 days    Minutes of Exercise per Session: 0 min  Stress: No Stress Concern Present (09/26/2021)   [employer] of Occupational Health - Occupational Stress Questionnaire    Feeling of Stress : Only a little  Social Connections: Moderately Integrated (09/26/2021)   Social Connection and Isolation Panel [NHANES]    Frequency of Communication with Friends and Family: More than three times a week    Frequency of Social Gatherings with Friends and Family: More than three times a week    Attends Religious Services: 1 to 4 times per year    Active Member of [name] or Organizations: No    Attends  Banker Meetings: Never    Marital Status: Married    Tobacco Counseling Counseling given: Not Answered Tobacco comments: Smoked for 2 years as a teen   Clinical Intake:                        Activities of Daily Living     No data to display          Patient Care Team: Tower, Audrie Gallus, MD as PCP - General Jodelle Red, MD as PCP - Cardiology (Cardiology) Venancio Poisson, MD as Consulting Physician (Dermatology) Eldred Manges, MD as Consulting Physician (Orthopedic Surgery) Ria Comment, FNP as Nurse Practitioner (Family Medicine) Patrecia Pace, Delsa Sale, MD as Attending Physician (Endocrinology) Pollyann Savoy, MD as Consulting Physician (Rheumatology) Glenford Peers, OD as Referring Physician (Optometry)  Indicate any recent Medical Services you may have received from other than [facility] providers in the past year (date may be approximate).     Assessment:   This is a routine wellness examination for [place].  Hearing/Vision screen No results found.  Dietary issues and exercise activities discussed:     Goals Addressed   None    Depression Screen    01/06/2023    3:03 PM 09/26/2021   11:24 AM 08/16/2020    3:55 PM 02/09/2019   11:45 AM 01/06/2018   11:20 AM 12/04/2016   10:40 AM 09/22/2015    2:34 PM  PHQ 2/9 Scores  PHQ - 2 Score 0 0 0 0 0 0 0  PHQ- 9 Score     0      Fall Risk    01/06/2023    3:03 PM 09/26/2021   11:22 AM 08/16/2020    3:56 PM 11/10/2019    3:25 PM 06/16/2019   11:26 AM  Fall Risk   Falls in the past year? 0 1 0 1 0  Comment  Emmi Telephone Survey: data to providers prior to load  Number falls in past yr: 0 0 0 0   Injury with Fall? 0 0 0 0   Comment  tripped down stairs     Risk for fall due to : No Fall Risks No Fall Risks     Follow up Falls evaluation completed Falls prevention discussed Falls evaluation completed      MEDICARE RISK AT HOME:   TIMED UP AND GO:  Was the test performed?   No    Cognitive Function:    01/06/2018   11:21 AM 12/04/2016   10:40 AM 09/22/2015    3:00 PM  MMSE - Mini Mental State Exam  Orientation to time 5 5 5   Orientation to Place 5 5 5   Registration 3 3 3   Attention/ Calculation 0 0 5  Recall 3 3 3   Language- name 2 objects 0 0 0  Language- repeat 1 1 1   Language- follow 3 step command 3 3 3   Language- read & follow direction 0 0 1  Write a sentence 0 0 0  Copy design 0 0 0  Total score 20 20 26         Immunizations Immunization History  Administered Date(s) Administered   Fluad Quad(high Dose 65+) 04/03/2019, 04/23/2019, 07/04/2020   Influenza,inj,Quad PF,6+ Mos 04/13/2014, 09/22/2015, 08/12/2016, 08/06/2017   Pneumococcal Conjugate-13 04/13/2014   Pneumococcal Polysaccharide-23 09/15/2012   Td 11/07/1997, 01/28/2007   Tdap 05/28/2017   Zoster Recombinant(Shingrix) 02/13/2018, 07/20/2018   Zoster, Live 09/09/2006    TDAP status: Up to date  Pneumococcal vaccine status: Up to date  Covid-19 vaccine status: Information provided on how to obtain vaccines.   Qualifies for Shingles Vaccine? Yes   Zostavax completed Yes   Shingrix Completed?: Yes  Screening Tests Health Maintenance  Topic Date Due   Medicare Annual Wellness (AWV)  09/27/2022   COVID-19 Vaccine (1) 01/22/2023 (Originally 08/09/1950)   MAMMOGRAM  05/22/2023   Colonoscopy  04/12/2024   DTaP/Tdap/Td (4 - Td or Tdap) 05/29/2027   Pneumonia Vaccine 24+ Years old  Completed   DEXA SCAN  Completed   Hepatitis C Screening  Completed   Zoster Vaccines- Shingrix  Completed   HPV VACCINES  Aged Out    Health Maintenance  Health Maintenance Due  Topic Date Due   Medicare Annual Wellness (AWV)  09/27/2022    Colorectal cancer screening: Type of screening: Colonoscopy. Completed 04/13/19. Repeat every 5 years  Mammogram status: Completed 05/21/22. Repeat every year  Bone Density status: Completed 05/11/20. Results reflect: Bone density results: NORMAL.  Repeat every 5 years.  Lung Cancer Screening: (Low Dose CT Chest recommended if Age 24-80 years, 20 pack-year currently smoking OR have quit w/in 15years.) does not qualify.   Lung Cancer Screening Referral: n/a  Additional Screening:  Hepatitis C Screening: does qualify; Completed 09/22/15  Vision Screening: Recommended annual ophthalmology exams for early detection of glaucoma and other disorders of the eye. Is the patient up to date with their annual eye exam?  {YES/NO:21197} Who is the provider or what is the name of the office in which the patient attends annual eye exams? *** If pt is not established with a provider, would they like to be referred to a provider to establish care? {YES/NO:21197}.   Dental Screening: Recommended annual dental exams for proper oral hygiene  Community Resource Referral / Chronic Care Management: CRR required this visit?  {YES/NO:21197}  CCM required this visit?  {CCM Required choices:[phone]}  Plan:     I have personally reviewed and noted the following in the patient's chart:   Medical and social history Use of alcohol, tobacco or illicit drugs  Current medications and supplements including opioid prescriptions. {Opioid Prescriptions:[phone]} Functional ability and status Nutritional status Physical activity Advanced directives List of other physicians Hospitalizations, surgeries, and ER visits in previous 12 months Vitals Screenings to include cognitive, depression, and falls Referrals and appointments  In addition, I have reviewed and discussed with patient certain preventive protocols, quality metrics, and best practice recommendations. A written personalized care plan for preventive services as well as general preventive health recommendations were provided to patient.     Kandis Fantasia [place], [place]   07/27/1094   After Visit Summary: {CHL AMB AWV After Visit [phone]}  Nurse Notes: ***

## 2023-01-21 ENCOUNTER — Ambulatory Visit (INDEPENDENT_AMBULATORY_CARE_PROVIDER_SITE_OTHER): Payer: PPO

## 2023-01-21 VITALS — Ht 60.0 in | Wt 148.0 lb

## 2023-01-21 DIAGNOSIS — Z0000 Encounter for general adult medical examination without abnormal findings: Secondary | ICD-10-CM | POA: Diagnosis not present

## 2023-03-04 ENCOUNTER — Telehealth: Payer: Self-pay | Admitting: Family Medicine

## 2023-03-04 DIAGNOSIS — E039 Hypothyroidism, unspecified: Secondary | ICD-10-CM

## 2023-03-04 NOTE — Telephone Encounter (Signed)
-----   Message from Lovena Neighbours sent at 02/18/2023  1:46 PM EDT ----- Regarding: Labs for 8.14.24 Pt is on lab schedule for 8.14.24, please put orders in future. Thank you, Denny Peon

## 2023-03-05 ENCOUNTER — Other Ambulatory Visit (INDEPENDENT_AMBULATORY_CARE_PROVIDER_SITE_OTHER): Payer: PPO

## 2023-03-05 DIAGNOSIS — E039 Hypothyroidism, unspecified: Secondary | ICD-10-CM | POA: Diagnosis not present

## 2023-03-06 LAB — TSH: TSH: 1.12 u[IU]/mL (ref 0.35–5.50)

## 2023-03-06 LAB — T4, FREE: Free T4: 0.79 ng/dL (ref 0.60–1.60)

## 2023-03-12 ENCOUNTER — Other Ambulatory Visit: Payer: PPO

## 2023-03-18 ENCOUNTER — Telehealth: Payer: Self-pay | Admitting: Family Medicine

## 2023-03-18 DIAGNOSIS — E042 Nontoxic multinodular goiter: Secondary | ICD-10-CM

## 2023-03-18 NOTE — Telephone Encounter (Signed)
In your inbox.

## 2023-03-18 NOTE — Telephone Encounter (Signed)
Patient dropped off office visit notes from ENDO. Please review

## 2023-03-24 NOTE — Telephone Encounter (Signed)
From what I can tell- she needs a thyroid ultrasound this fall-is that ok with her?  Does she want to do that in [place] or [place] ?  Thanks

## 2023-03-25 ENCOUNTER — Ambulatory Visit: Payer: PPO | Admitting: Dermatology

## 2023-03-25 ENCOUNTER — Encounter: Payer: Self-pay | Admitting: Dermatology

## 2023-03-25 DIAGNOSIS — D692 Other nonthrombocytopenic purpura: Secondary | ICD-10-CM | POA: Diagnosis not present

## 2023-03-25 DIAGNOSIS — L409 Psoriasis, unspecified: Secondary | ICD-10-CM

## 2023-03-25 DIAGNOSIS — L820 Inflamed seborrheic keratosis: Secondary | ICD-10-CM | POA: Diagnosis not present

## 2023-03-25 DIAGNOSIS — L299 Pruritus, unspecified: Secondary | ICD-10-CM

## 2023-03-25 NOTE — Patient Instructions (Addendum)

## 2023-03-25 NOTE — Telephone Encounter (Signed)
Patient called in and stated that she would like to go ahead and get the US done. She would like to have it done in [place]. Thank you!

## 2023-03-25 NOTE — Telephone Encounter (Signed)
I put the referral in  Please let us know if you don't hear in 1-2 weeks   

## 2023-03-25 NOTE — Progress Notes (Signed)
Follow-Up Visit   Subjective  Morgan [name] is a 77 y.o. female who presents for the following: Check spots on her arms and neck changing color. Patient c/o dermatitis in her ears,back scalp and arms treating with triamcinolone cream or Halobetasol cream, Clobetasol foam and Ketoconazole shampoo with a fair response. Occurring for over 12 years. Previously diagnosed as "dermatitis" The patient has spots, moles and lesions to be evaluated, some may be new or changing and the patient may have concern these could be cancer.   The following portions of the chart were reviewed this encounter and updated as appropriate: medications, allergies, medical history  Review of Systems:  No other skin or systemic complaints except as noted in HPI or Assessment and Plan.  Objective  Well appearing patient in no apparent distress; mood and affect are within normal limits.  A focused examination was performed of the following areas:face,neck,scalp,lower legs   Relevant exam findings are noted in the Assessment and Plan.  right lateral neck x 3 (3) Stuck-on, waxy, tan-brown papules-- Discussed benign etiology and prognosis.     Assessment & Plan   Patient instructed to call here when the rash flare on her skin and we will work her in to the schedule for possible biopsy.    Purpura - Chronic; persistent and recurrent.  Treatable, but not curable. Arms  - Violaceous macules and patches - Benign - Related to trauma, age, sun damage and/or use of blood thinners, chronic use of topical and/or oral steroids - Observe - Can use OTC arnica containing moisturizer such as Dermend Bruise Formula if desired - Call for worsening or other concerns   Inflamed seborrheic keratosis (3) right lateral neck x 3  Symptomatic, irritating, patient would like treated.   Destruction of lesion - right lateral neck x 3 (3) Complexity: simple   Destruction method: cryotherapy   Informed consent: discussed and  consent obtained   Timeout:  patient name, date of birth, surgical site, and procedure verified Lesion destroyed using liquid nitrogen: Yes   Region frozen until ice ball extended beyond lesion: Yes   Outcome: patient tolerated procedure well with no complications   Post-procedure details: wound care instructions given   Additional details:  Prior to procedure, discussed risks of blister formation, small wound, skin dyspigmentation, or rare scar following cryotherapy. Recommend Vaseline ointment to treated areas while healing.    SEBORRHEIC KERATOSIS - Stuck-on, waxy, tan-brown papules and/or plaques  - Benign-appearing - Discussed benign etiology and prognosis. - Observe - Call for any changes   SCALP PSORIASIS 2% BSA with special site involvement, chronic, flaring, not at patient goal Exam: well-demarcated erythematous patch with adherent scale on conchal bowls/external acoustic meati, posterior scalp   Treatment Plan: Continue Halobetasol cream twice daily up to 2 weeks as needed for rash/itching. Avoid applying to face, groin, and axilla. Use as directed. Long-term use can cause thinning of the skin. Continue ketoconazole shampoo to wash area, leave on 5-10 minutes.  Discussed biologics; patient will read info at home and call us if wanting to start. Would send labs to [facility]. Would consider risankizumab or secukinumab  Reviewed risks of biologics including immunosuppression, infections, injection site reaction, and failure to improve condition. Goal is control of skin condition, not cure.  Some older biologics such as Humira and Enbrel may slightly increase risk of malignancy and may worsen congestive heart failure.  Taltz and Cosentyx may cause inflammatory bowel disease to flare. The use of biologics requires long term  medication management, including periodic office visits and monitoring of blood work.   Patient report no known health conditions.   Return in about 1 year  (around 03/24/2024) for ISK, dermtatitis .  IAngelique Holm, CMA, am acting as scribe for Elie Goody, MD .   Documentation: I have reviewed the above documentation for accuracy and completeness, and I agree with the above.  Elie Goody, MD

## 2023-04-02 ENCOUNTER — Ambulatory Visit
Admission: RE | Admit: 2023-04-02 | Discharge: 2023-04-02 | Disposition: A | Discharge status: Home / Self Care | Payer: PPO | Source: Ambulatory Visit | Attending: Family Medicine | Admitting: Family Medicine

## 2023-04-02 DIAGNOSIS — E041 Nontoxic single thyroid nodule: Secondary | ICD-10-CM | POA: Diagnosis not present

## 2023-04-02 DIAGNOSIS — E042 Nontoxic multinodular goiter: Secondary | ICD-10-CM

## 2023-04-03 NOTE — Assessment & Plan Note (Signed)
Thyroid US 03/2023 IMPRESSION: Left superior thyroid nodule (labeled 4, 1.2 cm) meets criteria for surveillance, as designated by the newly established ACR TI-RADS criteria. Surveillance ultrasound study recommended to be performed annually up to 5 years.

## 2023-04-09 ENCOUNTER — Ambulatory Visit: Payer: PPO | Admitting: Dermatology

## 2023-04-09 DIAGNOSIS — D692 Other nonthrombocytopenic purpura: Secondary | ICD-10-CM | POA: Diagnosis not present

## 2023-04-09 DIAGNOSIS — L578 Other skin changes due to chronic exposure to nonionizing radiation: Secondary | ICD-10-CM

## 2023-04-09 DIAGNOSIS — L814 Other melanin hyperpigmentation: Secondary | ICD-10-CM | POA: Diagnosis not present

## 2023-04-09 DIAGNOSIS — W908XXA Exposure to other nonionizing radiation, initial encounter: Secondary | ICD-10-CM | POA: Diagnosis not present

## 2023-04-09 DIAGNOSIS — L820 Inflamed seborrheic keratosis: Secondary | ICD-10-CM

## 2023-04-09 DIAGNOSIS — L409 Psoriasis, unspecified: Secondary | ICD-10-CM

## 2023-04-09 DIAGNOSIS — L570 Actinic keratosis: Secondary | ICD-10-CM

## 2023-04-09 DIAGNOSIS — L821 Other seborrheic keratosis: Secondary | ICD-10-CM | POA: Diagnosis not present

## 2023-04-09 DIAGNOSIS — L280 Lichen simplex chronicus: Secondary | ICD-10-CM

## 2023-04-09 MED ORDER — HALOBETASOL PROPIONATE 0.05 % EX CREA: TOPICAL_CREAM | CUTANEOUS | 0 refills | Status: DC

## 2023-04-09 NOTE — Patient Instructions (Signed)
Due to recent changes in healthcare laws, you may see results of your pathology and/or laboratory studies on MyChart before the doctors have had a chance to review them. We understand that in some cases there may be results that are confusing or concerning to you. Please understand that not all results are received at the same time and often the doctors may need to interpret multiple results in order to provide you with the best plan of care or course of treatment. Therefore, we ask that you please give [place] 2 business days to thoroughly review all your results before contacting the office for clarification. Should we see a critical lab result, you will be contacted sooner.   If You Need Anything After Your Visit  If you have any questions or concerns for your doctor, please call our main line at [phone] and press option 4 to reach your doctor's medical assistant. If no one answers, please leave a voicemail as directed and we will return your call as soon as possible. Messages left after 4 pm will be answered the following business day.   You may also send [place] a message via MyChart. We typically respond to MyChart messages within 1-2 business days.  For prescription refills, please ask your pharmacy to contact our office. Our fax number is [phone].  If you have an urgent issue when the clinic is closed that cannot wait until the next business day, you can page your doctor at the number below.    Please note that while we do our best to be available for urgent issues outside of office hours, we are not available 24/7.   If you have an urgent issue and are unable to reach [place], you may choose to seek medical care at your doctor's office, retail clinic, urgent care center, or emergency room.  If you have a medical emergency, please immediately call 911 or go to the emergency department.  Pager Numbers  - Dr. Gwen Pounds: [phone]  - Dr. Roseanne Reno: [phone]  - Dr. Katrinka Blazing: [phone]    In the event of inclement weather, please call our main line at [phone] for an update on the status of any delays or closures.  Dermatology Medication Tips: Please keep the boxes that topical medications come in in order to help keep track of the instructions about where and how to use these. Pharmacies typically print the medication instructions only on the boxes and not directly on the medication tubes.   If your medication is too expensive, please contact our office at [phone] option 4 or send [place] a message through MyChart.   We are unable to tell what your co-pay for medications will be in advance as this is different depending on your insurance coverage. However, we may be able to find a substitute medication at lower cost or fill out paperwork to get insurance to cover a needed medication.   If a prior authorization is required to get your medication covered by your insurance company, please allow [place] 1-2 business days to complete this process.  Drug prices often vary depending on where the prescription is filled and some pharmacies may offer cheaper prices.  The website www.goodrx.com contains coupons for medications through different pharmacies. The prices here do not account for what the cost may be with help from insurance (it may be cheaper with your insurance), but the website can give you the price if you did not use any insurance.  - You can print the associated coupon and take it  with your prescription to the pharmacy.  - You may also stop by our office during regular business hours and pick up a GoodRx coupon card.  - If you need your prescription sent electronically to a different pharmacy, notify our office through [facility] or by phone at [phone] option 4.     Si Usted Necesita Algo Despus de Su Visita  Tambin puede enviarnos un mensaje a travs de Clinical cytogeneticist. Por lo general respondemos a los mensajes de MyChart en el transcurso de 1 a 2 das  hbiles.  Para renovar recetas, por favor pida a su farmacia que se ponga en contacto con nuestra oficina. Annie Sable de fax es [address] [phone].  Si tiene un asunto urgente cuando la clnica est cerrada y que no puede esperar hasta el siguiente da hbil, puede llamar/localizar a su doctor(a) al nmero que aparece a continuacin.   Por favor, tenga en cuenta que aunque hacemos todo lo posible para estar disponibles para asuntos urgentes fuera del horario de [place], no estamos disponibles las 24 horas del da, los 7 [address]  [address] de la [place].   Si tiene un problema urgente y no puede comunicarse con nosotros, puede optar por buscar atencin mdica  en el consultorio de su doctor(a), en una clnica privada, en un centro de atencin urgente o en una sala de emergencias.  Si tiene Engineer, drilling, por favor llame inmediatamente al 911 o vaya a la sala de emergencias.  Nmeros de bper  - Dr. Gwen Pounds: [phone]  - Dra. Roseanne Reno: [phone]  - Dr. Katrinka Blazing: [phone]   En caso de inclemencias del tiempo, por favor llame a Lacy Duverney principal al [phone] para una actualizacin sobre el [place] de cualquier retraso o cierre.  Consejos para la medicacin en dermatologa: Por favor, guarde las cajas en las que vienen los medicamentos de uso tpico para ayudarle a seguir las instrucciones sobre dnde y cmo usarlos. Las farmacias generalmente imprimen las instrucciones del medicamento slo en las cajas y no directamente en los tubos del [place].   Si su medicamento es muy caro, por favor, pngase en contacto con Rolm Gala llamando al [phone] y presione la opcin 4 o envenos un mensaje a travs de Clinical cytogeneticist.   No podemos decirle cul ser su copago por los medicamentos por adelantado ya que esto es diferente dependiendo de la cobertura de su seguro. Sin embargo, es posible que podamos encontrar un medicamento sustituto a Audiological scientist un formulario para que el  seguro cubra el medicamento que se considera necesario.   Si se requiere una autorizacin previa para que su compaa de seguros [place] su medicamento, por favor permtanos de 1 a 2 das hbiles para completar [address]  Los precios de los medicamentos varan con frecuencia dependiendo del Environmental consultant de dnde se surte la receta y alguna farmacias pueden ofrecer precios ms baratos.  El sitio web www.goodrx.com tiene cupones para medicamentos de Health and safety inspector. Los precios aqu no tienen en cuenta lo que podra costar con la ayuda del seguro (puede ser ms barato con su seguro), pero el sitio web puede darle el precio si no utiliz Tourist information centre manager.  - Puede imprimir el cupn correspondiente y llevarlo con su receta a la farmacia.  - Tambin puede pasar por nuestra oficina durante el horario de atencin regular y Education officer, museum una tarjeta de cupones de GoodRx.  - Si necesita que su receta se enve electrnicamente a Psychiatrist, informe a nuestra oficina a travs de MyChart de  [place] o por telfono llamando al [phone] y presione la opcin 4.

## 2023-04-09 NOTE — Progress Notes (Signed)
Follow-Up Visit   Subjective  Morgan [name] is a 77 y.o. female who presents for the following: Rash on the arms and back - currently using Halobetasol 0.05%, which helps slightly with itching, and an OTC antihistamine to help with itching so she can sleep at night.  Pt has long h/o scalp seb derm/psoriasis.  The patient has spots, moles and lesions to be evaluated, some may be new or changing and the patient may have concern these could be cancer.  Several are irritated and itching.   The following portions of the chart were reviewed this encounter and updated as appropriate: medications, allergies, medical history  Review of Systems:  No other skin or systemic complaints except as noted in HPI or Assessment and Plan.  Objective  Well appearing patient in no apparent distress; mood and affect are within normal limits.   A focused examination was performed of the following areas: the face, back, arms, hands   Relevant exam findings are noted in the Assessment and Plan.  L upper pretibia x 1, L forearm x 1, R mid jaw x 1 (3) Erythematous stuck-on, waxy papule or plaque  R nasal tip x 1, R perinasal x 1 (vs ISK) (2) Pink scaly macule, light pink/tan scaly macule    Assessment & Plan   Purpura - Chronic; persistent and recurrent.  Treatable, but not curable. - Violaceous macules and patches - Benign - Related to trauma, age, sun damage and/or use of blood thinners, chronic use of topical and/or oral steroids - Observe - Can use OTC arnica containing moisturizer such as Dermend Bruise Formula or Amlactin cream if desired - Call for worsening or other concerns  ACTINIC DAMAGE - chronic, secondary to cumulative UV radiation exposure/sun exposure over time - diffuse scaly erythematous macules with underlying dyspigmentation - Recommend daily broad spectrum sunscreen SPF 30+ to sun-exposed areas, reapply every 2 hours as needed.  - Recommend staying in the shade or wearing long  sleeves, sun glasses (UVA+UVB protection) and wide brim hats (4-inch brim around the entire circumference of the hat). - Call for new or changing lesions.  SEBORRHEIC KERATOSIS - Stuck-on, waxy, tan-brown papules and/or plaques  - Benign-appearing - Discussed benign etiology and prognosis. - Observe - Call for any changes  LENTIGINES Exam: scattered tan macules Due to sun exposure Treatment Plan: Benign-appearing, observe. Recommend daily broad spectrum sunscreen SPF 30+ to sun-exposed areas, reapply every 2 hours as needed.  Call for any changes  Lichen Simplex Chronicus Exam: L forearm, light pink tan patch with mild lichenification. Pt c/o persistent severe itching  Chronic and persistent condition with duration or expected duration over one year. Condition is bothersome/symptomatic for patient. Currently flared.   Lichen simplex chronicus [facility]) is a persistent itchy area of thickened skin that is induced by chronic rubbing and/or scratching (chronic dermatitis).  These areas may be pink, hyperpigmented and may have excoriations.  LSC is commonly observed in uncontrolled atopic dermatitis and other forms of eczema, and in other itchy skin conditions (eg, insect bites, scabies).  Sometimes it is not possible to know initial cause of LSC if it has been present for a long time.  It generally responds well to treatment with high potency topical steroids.  It is important to stop rubbing/scratching the area in order to break the itch-scratch-rash-itch cycle, in order for the rash to resolve.   Treatment Plan: Avoid scratching/rubbing area. Recommend Amlactin moisturizer 1-2x/day.  Sample given of Opzelura cream apply to aa QD-BID  until itching resolved   May apply halobetasol cream once daily prn flares  Inflamed seborrheic keratosis (3) L upper pretibia x 1, L forearm x 1, R mid jaw x 1  Symptomatic, irritating, patient would like treated.   Destruction of lesion - L upper pretibia  x 1, L forearm x 1, R mid jaw x 1 (3)  Destruction method: cryotherapy   Informed consent: discussed and consent obtained   Lesion destroyed using liquid nitrogen: Yes   Region frozen until ice ball extended beyond lesion: Yes   Outcome: patient tolerated procedure well with no complications   Post-procedure details: wound care instructions given   Additional details:  Prior to procedure, discussed risks of blister formation, small wound, skin dyspigmentation, or rare scar following cryotherapy. Recommend Vaseline ointment to treated areas while healing.   AK (actinic keratosis) (2) R nasal tip x 1, R perinasal x 1 (vs ISK)  Actinic keratoses are precancerous spots that appear secondary to cumulative UV radiation exposure/sun exposure over time. They are chronic with expected duration over 1 year. A portion of actinic keratoses will progress to squamous cell carcinoma of the skin. It is not possible to reliably predict which spots will progress to skin cancer and so treatment is recommended to prevent development of skin cancer.  Recommend daily broad spectrum sunscreen SPF 30+ to sun-exposed areas, reapply every 2 hours as needed.  Recommend staying in the shade or wearing long sleeves, sun glasses (UVA+UVB protection) and wide brim hats (4-inch brim around the entire circumference of the hat). Call for new or changing lesions.   Destruction of lesion - R nasal tip x 1, R perinasal x 1 (vs ISK) (2)  Destruction method: cryotherapy   Informed consent: discussed and consent obtained   Lesion destroyed using liquid nitrogen: Yes   Region frozen until ice ball extended beyond lesion: Yes   Outcome: patient tolerated procedure well with no complications   Post-procedure details: wound care instructions given   Additional details:  Prior to procedure, discussed risks of blister formation, small wound, skin dyspigmentation, or rare scar following cryotherapy. Recommend Vaseline ointment to  treated areas while healing.   PSORIASIS Exam: Pink scaly papules of the L upper back, L mid back, pink scaly patch occipital scalp 1% BSA.  Chronic and persistent condition with duration or expected duration over one year. Condition is bothersome/symptomatic for patient. Currently flared.  Psoriasis is a chronic non-curable, but treatable genetic/hereditary disease that may have other systemic features affecting other organ systems such as joints (Psoriatic Arthritis). It is associated with an increased risk of inflammatory bowel disease, heart disease, non-alcoholic fatty liver disease, and depression.  Treatments include light and laser treatments; topical medications; and systemic medications including oral and injectables.  Treatment Plan: Continue Halobetasol cream twice daily up to 2 weeks as needed for rash/itching on body. Avoid applying to face, groin, and axilla. Use as directed. Long-term use can cause thinning of the skin.  Continue ketoconazole 2% shampoo to wash area scalp, leave on 3-5 minutes prior to rinsing   Continue Clobetasol foam to aa's scalp QD x 1-2 weeks then when improved decrease use to PRN. Avoid applying to face, groin, and axilla. Use as directed. Long-term use can cause thinning of the skin.   Return for appointment as scheduled.  Maylene Roes, CMA, am acting as scribe for Willeen Niece, MD .  Documentation: I have reviewed the above documentation for accuracy and completeness, and I agree with the above.  Willeen Niece, MD

## 2023-04-17 ENCOUNTER — Other Ambulatory Visit: Payer: Self-pay

## 2023-04-17 MED ORDER — OPZELURA 1.5 % EX CREA: TOPICAL_CREAM | CUTANEOUS | 2 refills | Status: AC

## 2023-04-17 NOTE — Telephone Encounter (Signed)
Patient requesting a rx for Opzelura cream, rx sent to [facility] pharmacy

## 2023-04-23 DIAGNOSIS — K219 Gastro-esophageal reflux disease without esophagitis: Secondary | ICD-10-CM | POA: Diagnosis not present

## 2023-04-23 DIAGNOSIS — K5730 Diverticulosis of large intestine without perforation or abscess without bleeding: Secondary | ICD-10-CM | POA: Diagnosis not present

## 2023-04-23 DIAGNOSIS — K589 Irritable bowel syndrome without diarrhea: Secondary | ICD-10-CM | POA: Diagnosis not present

## 2023-04-23 DIAGNOSIS — K9289 Other specified diseases of the digestive system: Secondary | ICD-10-CM | POA: Diagnosis not present

## 2023-04-29 DIAGNOSIS — K5900 Constipation, unspecified: Secondary | ICD-10-CM | POA: Diagnosis not present

## 2023-04-29 DIAGNOSIS — K589 Irritable bowel syndrome without diarrhea: Secondary | ICD-10-CM | POA: Diagnosis not present

## 2023-05-01 DIAGNOSIS — R748 Abnormal levels of other serum enzymes: Secondary | ICD-10-CM | POA: Diagnosis not present

## 2023-05-01 DIAGNOSIS — R1010 Upper abdominal pain, unspecified: Secondary | ICD-10-CM | POA: Diagnosis not present

## 2023-05-28 DIAGNOSIS — N958 Other specified menopausal and perimenopausal disorders: Secondary | ICD-10-CM | POA: Diagnosis not present

## 2023-05-28 DIAGNOSIS — Z8262 Family history of osteoporosis: Secondary | ICD-10-CM | POA: Diagnosis not present

## 2023-05-28 DIAGNOSIS — Z7952 Long term (current) use of systemic steroids: Secondary | ICD-10-CM | POA: Diagnosis not present

## 2023-05-28 DIAGNOSIS — Z1231 Encounter for screening mammogram for malignant neoplasm of breast: Secondary | ICD-10-CM | POA: Diagnosis not present

## 2023-05-28 DIAGNOSIS — E2839 Other primary ovarian failure: Secondary | ICD-10-CM | POA: Diagnosis not present

## 2023-05-28 LAB — HM MAMMOGRAPHY

## 2023-05-28 LAB — HM DEXA SCAN: HM Dexa Scan: NORMAL

## 2023-05-29 ENCOUNTER — Encounter: Payer: Self-pay | Admitting: *Deleted

## 2023-05-29 ENCOUNTER — Encounter: Payer: Self-pay | Admitting: Family Medicine

## 2023-06-29 IMAGING — MR MR ABDOMEN WO/W CM MRCP
19 of 21 series · 45 of 48 positions shown · IV contrast (6ml Gadavist)
Comparison: CTA abdomen/pelvis 12/21/2021.

CLINICAL DATA: Severe epigastric and abdominal pain.
Choledocholithiasis on recent CTA

EXAM:
MRI ABDOMEN WITHOUT AND WITH CONTRAST (INCLUDING MRCP)
TECHNIQUE: Multiplanar multisequence MR imaging of the abdomen was performed
both before and after the administration of intravenous contrast.
Heavily T2-weighted images of the biliary and pancreatic ducts were
obtained, and three-dimensional MRCP images were rendered by post
processing.
CONTRAST:  6mL GADAVIST GADOBUTROL 1 MMOL/ML IV SOLN

[Series 3: T2 · coronal · 6.0mm · 1.19mm/px · 1 of 30 slices shown (1 of 2)]
[im 1/30]
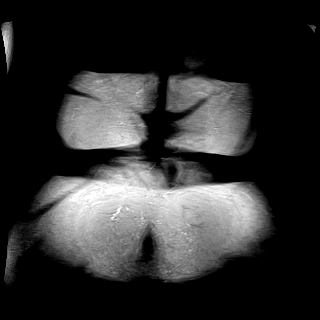

[Series 4: T2 · axial · 6.0mm · 1.19mm/px · 1 of 32 slices shown (2 of 2)]
[im 1/32]
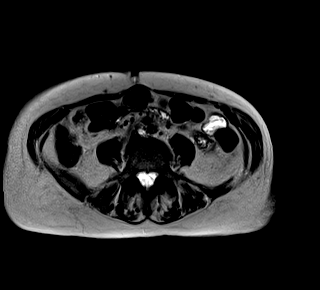

[Series 5: in & out · axial · 3.0mm · 1.19mm/px · z∈[-43,+194]mm · 2 of 80 slices shown (1 of 2)]
[im 1/80]
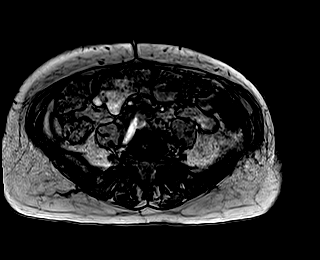
[im 80/80]
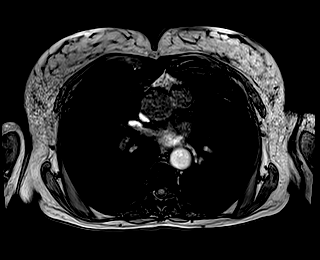

[Series 6: in & out · axial · 3.0mm · 1.19mm/px · z∈[-43,+194]mm · 3 of 80 slices shown (2 of 2)]
[im 1/80]
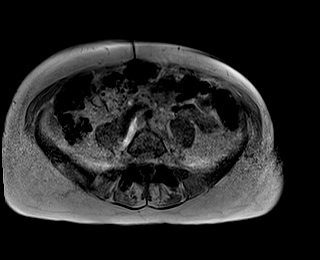
[im 40/80]
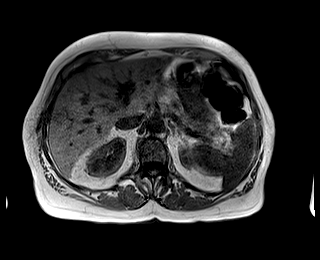
[im 80/80]
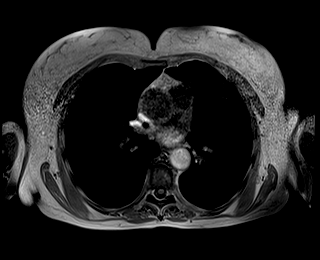

[Series 9: T2 fat-sat · axial · 6.0mm · 1.19mm/px · 1 of 34 slices shown]
[im 1/34]
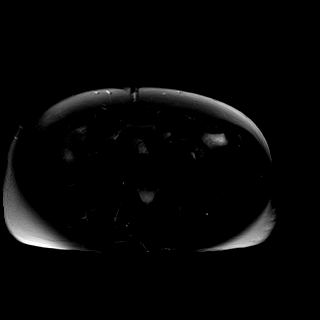

[Series 10: ax dwi_tracew · axial · 6.0mm · 1.42mm/px · z∈[-37,+200]mm · 4 of 102 slices shown]
[im 1/102]
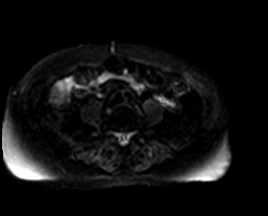
[im 34/102]
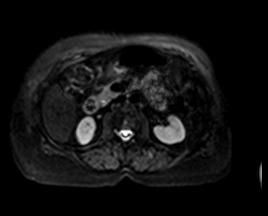
[im 68/102]
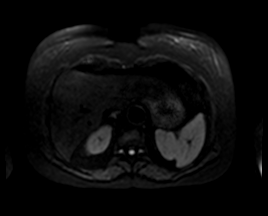
[im 102/102]
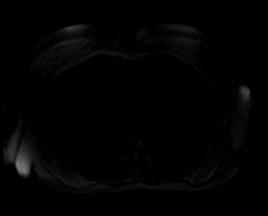

[Series 11: ax dwi_adc · axial · 6.0mm · 1.42mm/px · 1 of 34 slices shown]
[im 1/34]
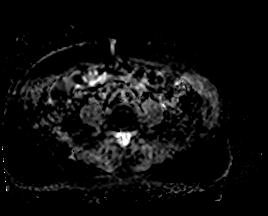

[Series 15: MRCP · coronal · 3.0mm · 1.12mm/px · 1 of 19 slices shown]
[im 1/19]
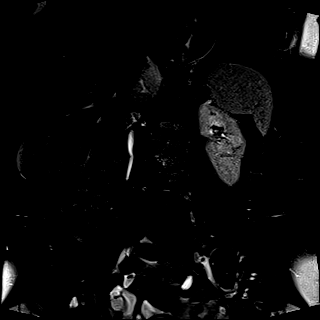

[Series 16: radials · coronal · 50.0mm · 0.78mm/px · 1 of 5 slices shown]
[im 1/5]
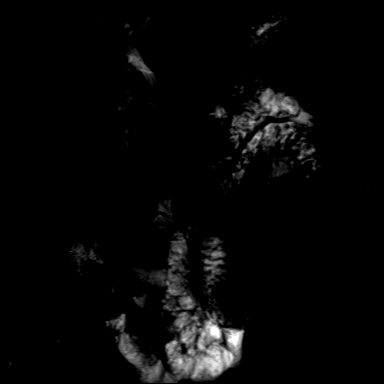

[Series 17: T1 dynamic fat-sat · axial · non-contrast · 3.0mm · 1.19mm/px · z∈[-43,+194]mm · 3 of 80 slices shown (1 of 5)]
[im 1/80]
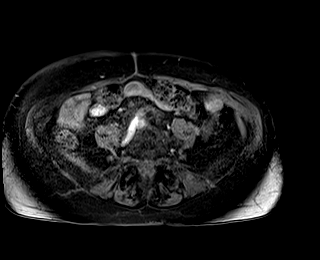
[im 40/80]
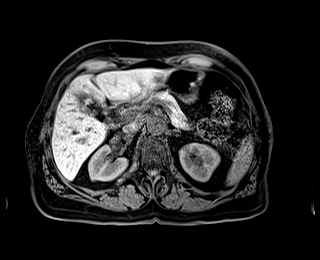
[im 80/80]
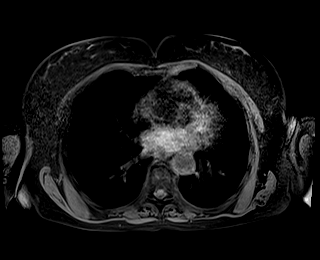

[Series 18: T1 dynamic fat-sat post-contrast · axial · 3.0mm · 1.19mm/px · z∈[-43,+194]mm · 3 of 80 slices shown (1 of 4)]
[im 1/80]
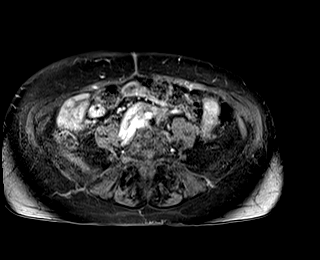
[im 40/80]
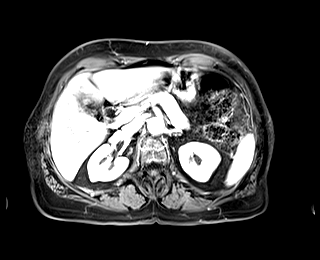
[im 80/80]
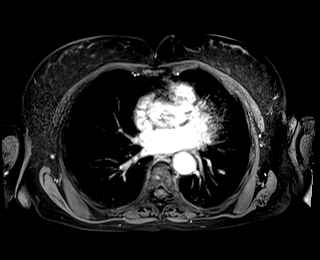

[Series 19: T1 dynamic fat-sat · axial · 3.0mm · 1.19mm/px · z∈[-43,+194]mm · 3 of 80 slices shown (2 of 5)]
[im 1/80]
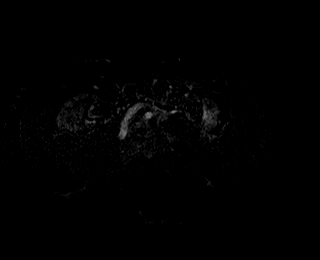
[im 40/80]
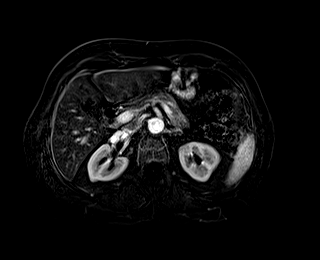
[im 80/80]
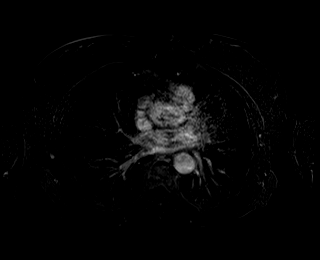

[Series 20: T1 dynamic fat-sat post-contrast · axial · 3.0mm · 1.19mm/px · z∈[-43,+194]mm · 3 of 80 slices shown (2 of 4)]
[im 1/80]
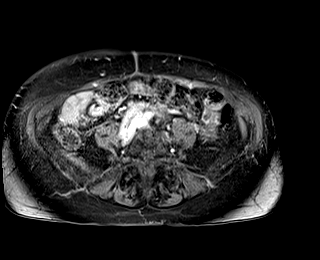
[im 40/80]
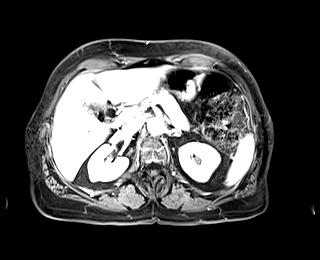
[im 80/80]
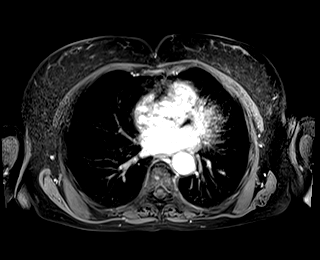

[Series 21: T1 dynamic fat-sat · axial · 3.0mm · 1.19mm/px · z∈[-43,+194]mm · 3 of 80 slices shown (3 of 5)]
[im 1/80]
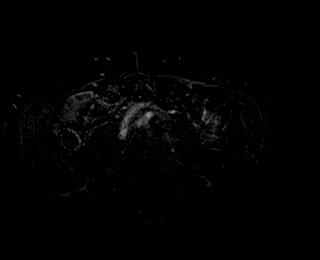
[im 40/80]
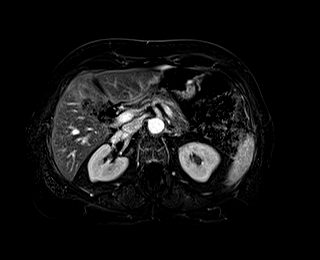
[im 80/80]
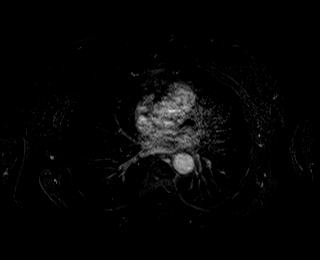

[Series 22: T1 dynamic fat-sat post-contrast · axial · 3.0mm · 1.19mm/px · z∈[-43,+194]mm · 3 of 80 slices shown (3 of 4)]
[im 1/80]
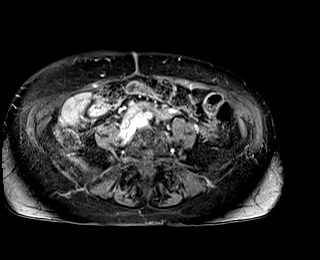
[im 40/80]
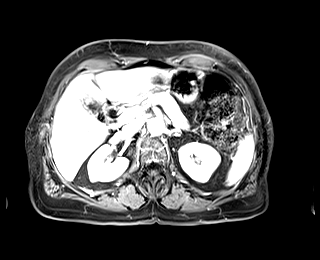
[im 80/80]
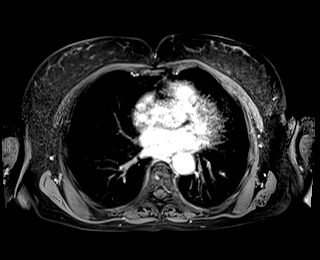

[Series 23: T1 dynamic fat-sat · axial · 3.0mm · 1.19mm/px · z∈[-43,+194]mm · 3 of 80 slices shown (4 of 5)]
[im 1/80]
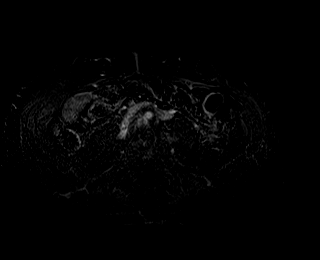
[im 40/80]
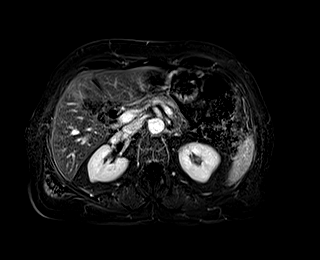
[im 80/80]
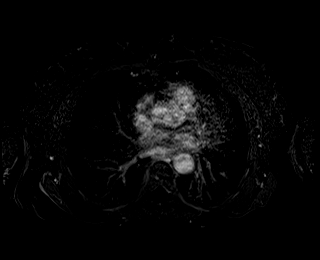

[Series 24: T1 dynamic post-contrast · coronal · 3.0mm · 1.31mm/px · 3 of 72 slices shown]
[im 1/72]
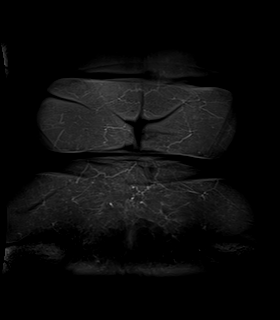
[im 36/72]
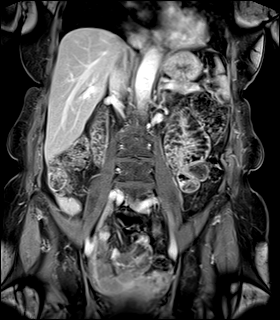
[im 72/72]
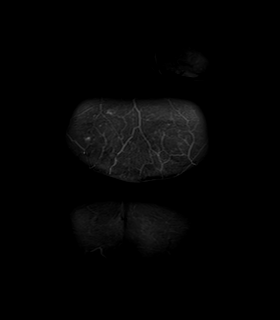

[Series 25: T1 dynamic fat-sat post-contrast · axial · 3.0mm · 1.19mm/px · z∈[-43,+194]mm · 3 of 80 slices shown (4 of 4)]
[im 1/80]
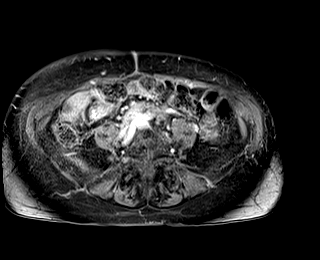
[im 40/80]
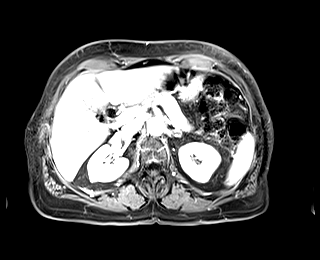
[im 80/80]
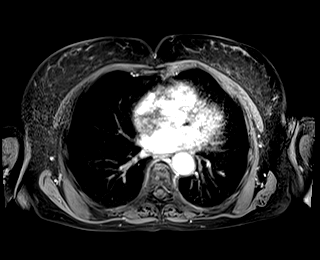

[Series 26: T1 dynamic fat-sat · axial · 3.0mm · 1.19mm/px · z∈[-43,+194]mm · 3 of 80 slices shown (5 of 5)]
[im 1/80]
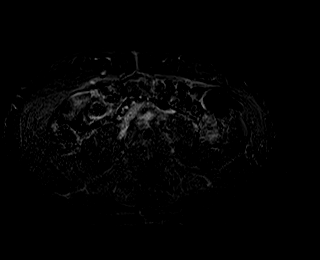
[im 40/80]
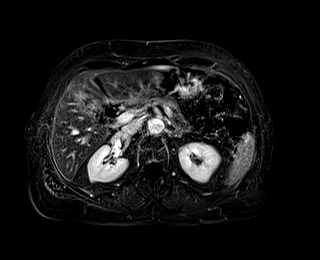
[im 80/80]
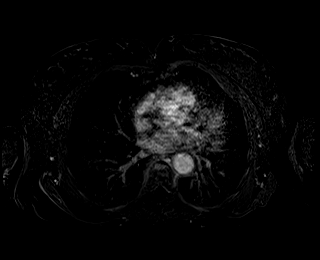

[45 of 48 positions shown; findings below may reference images not displayed]

FINDINGS: Lower chest: Unremarkable.

Hepatobiliary: Liver measures 17.3 cm craniocaudal length,
borderline enlarged. No worrisome focal abnormality within the liver
parenchyma. Gallbladder surgically absent. Mild intrahepatic biliary
duct dilatation is associated with common bile duct measuring up to
8 mm in the head of the pancreas. As noted on recent CT are 2 stones
in the distal common bile duct (coronal MRCP image 19 of series 13
and well demonstrated on images 4 and 5 of coronal thin MRCP series
15). More proximal of the 2 stones measures about 6 mm and the more
distal is approximately 5 mm.

Pancreas: No focal mass lesion. No dilatation of the main duct. No
intraparenchymal cyst. No peripancreatic edema.

Spleen:  No splenomegaly. No focal mass lesion.

Adrenals/Urinary Tract: No adrenal nodule or mass. Kidneys
unremarkable.

Stomach/Bowel: Stomach is unremarkable. No gastric wall thickening.
No evidence of outlet obstruction. Duodenum is normally positioned
as is the ligament of Treitz. No small bowel or colonic dilatation
within the visualized abdomen.

Vascular/Lymphatic: No abdominal aortic aneurysm. No abdominal
aortic atherosclerotic calcification. There is no gastrohepatic or
hepatoduodenal ligament lymphadenopathy. No retroperitoneal or
mesenteric lymphadenopathy.

Other:  No intraperitoneal free fluid.

Musculoskeletal: No focal suspicious marrow enhancement within the
visualized bony anatomy.
IMPRESSION: 1. 2 stones in the distal common bile duct with intra and extra
hepatic biliary duct dilatation.
2. Status post cholecystectomy.

## 2023-08-05 ENCOUNTER — Telehealth: Payer: Self-pay

## 2023-08-05 NOTE — Telephone Encounter (Signed)
 Copied from CRM [id number]. Topic: Clinical - Medical Advice >> Aug 05, 2023 10:35 AM Leila BROCKS wrote: Reason for CRM: Patient [phone] states tested positive for Covid at home kit, and husband is in the hospital with Covid. Patient is having head congestion, like she's having sinus infection, bowling ball in the head, sore throat, coughing, running nose, body aches and neck hurting and denies a fever. Can patient get a medication sent to [facility] [address] [place] [address] Phone:[phone]. Advised patient needs to be seen, patient asked if Dr. Randeen to advised and call back today.

## 2023-08-05 NOTE — Telephone Encounter (Signed)
 Virtual visit is fine

## 2023-08-05 NOTE — Telephone Encounter (Signed)
 Needs to have visit in order to discuss antiviral/ pros/cons and see if she is a candidate and evaluate further  First available please   So sorry to hear that!

## 2023-08-05 NOTE — Telephone Encounter (Signed)
 Spoke to pt, pt states later this week is too far from now to be seen. Next slot available isn't until Thurs, 1/16

## 2023-08-05 NOTE — Telephone Encounter (Signed)
 Spoke to pt, pt stated she's too sick to come into the office for a visit.

## 2023-08-05 NOTE — Telephone Encounter (Signed)
 Called pt and schedule an appt with 1st available per Dr. Milinda Antis. Virtual visit was scheduled with Wyatt Mage, NP   ER precautions given

## 2023-08-06 ENCOUNTER — Telehealth: Payer: PPO | Admitting: Family

## 2023-08-18 ENCOUNTER — Encounter: Payer: Self-pay | Admitting: Family Medicine

## 2023-08-18 ENCOUNTER — Ambulatory Visit (INDEPENDENT_AMBULATORY_CARE_PROVIDER_SITE_OTHER): Payer: PPO | Admitting: Family Medicine

## 2023-08-18 VITALS — BP 108/62 | HR 55 | Temp 98.5°F | Ht 60.0 in | Wt 146.0 lb

## 2023-08-18 DIAGNOSIS — M7122 Synovial cyst of popliteal space [Baker], left knee: Secondary | ICD-10-CM | POA: Diagnosis not present

## 2023-08-18 DIAGNOSIS — M25562 Pain in left knee: Secondary | ICD-10-CM | POA: Insufficient documentation

## 2023-08-18 DIAGNOSIS — M170 Bilateral primary osteoarthritis of knee: Secondary | ICD-10-CM | POA: Insufficient documentation

## 2023-08-18 NOTE — Patient Instructions (Addendum)
Get some voltaren gel 1%  over the counter and use on the knee up to 4 times daily   Start using a cold compress for 10 minutes whenever you can  Whenever you can - also elevate the leg   Tylenol is ok also   If compression helps - you can use a neoprene knee sleeve over the counter  During the day only   I ordered an ultrasound of the leg to look for evidence of a Baker's cyst  I put the referral in  Please let us know if you don't hear in 1 week    We may have you follow up with orthopedics depending on results and progress    If symptoms suddenly worsen please let me know

## 2023-08-18 NOTE — Progress Notes (Signed)
Subjective:    Patient ID: Morgan [name], female    DOB: 28-Mar-1946, 78 y.o.   MRN: [id number]  HPI  Wt Readings from Last 3 Encounters:  08/18/23 146 lb (66.2 kg)  01/21/23 148 lb (67.1 kg)  01/06/23 148 lb (67.1 kg)   28.51 kg/m  Vitals:   08/18/23 1234  BP: 108/62  Pulse: (!) 55  Temp: 98.5 F (36.9 C)  SpO2: 98%    Pt presents with leg pain/ back of knee on left   Started hurting about 4 days ago  Pain is posterior and wrapping around to sides  Back of knee feels swollen and tight  Pain is both sharp and dull No bruising  Runs down her leg (but not like sciatica)   No pain to sit still  Hurts to walk and bear weight    Husband is in rehab  She has to be there 6 hours per day  She and husband both had covid  She feels better from that   (was less active but did tend to get up very frequently)   Unsure when he will come back - 1-2 weeks most likely     She has history of DDD lumbar with spinal stenosis  Also neuropathy  Also OA   History of  OA and chondromalacia of patella on xray of both knees in 2019   No over the counter medicines besides occational ibuprofen at night for sleep      Patient Active Problem List   Diagnosis Date Noted   Posterior knee pain, left 08/18/2023   Osteoarthritis of both knees 08/18/2023   Estrogen deficiency 10/22/2022   Pre-syncope 11/30/2021   Insomnia 11/30/2021   Bradycardia 11/30/2021   Fasting hypoglycemia 10/03/2021   S/P laparoscopic cholecystectomy 08/22/2021   Idiopathic small fiber sensory neuropathy 04/12/2020   Belching 02/09/2020   Vitamin D deficiency 10/05/2019   Colon cancer screening 02/09/2019   Paresthesia of foot, bilateral 08/12/2016   Encounter for Medicare annual wellness exam 04/13/2014   Allergic rhinitis 04/13/2014   Routine general medical examination at a health care facility 04/07/2014   Multiple thyroid nodules 01/24/2014   History of small bowel obstruction 01/20/2014   DDD  (degenerative disc disease), cervical    DDD (degenerative disc disease), lumbar    Spinal stenosis    TINNITUS 06/08/2010   Hypothyroidism 04/18/2010   GERD 05/03/2008   DIVERTICULOSIS OF COLON 04/26/2008   HEPATITIS, HX OF 04/26/2008   History of colonic polyps 04/26/2008   HEMORRHOIDS, INTERNAL 01/11/2008   DERMATITIS, SEBORRHEIC NEC 01/28/2007   Hyperlipidemia 01/19/2007   IBS 01/19/2007   OVERACTIVE BLADDER 01/19/2007   POSTMENOPAUSAL STATUS 01/19/2007   ECZEMA 01/19/2007   Osteoarthritis 01/19/2007   Past Medical History:  Diagnosis Date   Allergy    Bradycardia    Collagen vascular disease (HCC)    Connective tissue disease (HCC)    COVID-19    DDD (degenerative disc disease), cervical    DDD (degenerative disc disease), lumbar    Endometrial polyp    not resolved with D&C (following)   GERD (gastroesophageal reflux disease)    gas/belching issues-was on protonix for short time-not since 6 months   HLD (hyperlipidemia)    Mixed connective tissue disease (HCC) diagnosed 5 years ago   no problems since diagnosis   Multiple thyroid nodules 01/24/2014   Obesity    Osteoarthritis of cervical spine    Renal calculi    Spinal stenosis  Past Surgical History:  Procedure Laterality Date   APPENDECTOMY     CHOLECYSTECTOMY N/A 08/22/2021   Procedure: LAPAROSCOPIC CHOLECYSTECTOMY;  Surgeon: Abigail Miyamoto, MD;  Location: [facility];  Service: General;  Laterality: N/A;   COLONOSCOPY     DILATION AND CURETTAGE OF UTERUS     fibroids   DILATION AND CURETTAGE OF UTERUS  01/2005   endometrial polyp   ENDOMETRIAL BIOPSY  2008   fibroid   ENDOSCOPIC RETROGRADE CHOLANGIOPANCREATOGRAPHY (ERCP) WITH PROPOFOL N/A 01/21/2022   Procedure: ENDOSCOPIC RETROGRADE CHOLANGIOPANCREATOGRAPHY (ERCP) WITH PROPOFOL;  Surgeon: Iva Boop, MD;  Location: [facility] ENDOSCOPY;  Service: Gastroenterology;  Laterality: N/A;   HAND SURGERY     carpal tunnel release bilateral Dr.  Teressa Senter   hysteroscopic resection      polyps of PMB   REMOVAL OF STONES  01/21/2022   Procedure: REMOVAL OF STONES;  Surgeon: Iva Boop, MD;  Location: [facility] ENDOSCOPY;  Service: Gastroenterology;;   Dennison Mascot  01/21/2022   Procedure: SPHINCTEROTOMY;  Surgeon: Iva Boop, MD;  Location[facility] ENDOSCOPY;  Service: Gastroenterology;;   TONSILLECTOMY     TUBAL LIGATION     1973   UPPER GASTROINTESTINAL ENDOSCOPY     Social History   Tobacco Use   Smoking status: Former    Current packs/day: 0.00    Types: Cigarettes    Quit date: 07/22/1965    Years since quitting: 58.1   Smokeless tobacco: Never   Tobacco comments:    Smoked for 2 years as a teen  Advertising account planner   Vaping status: Never Used  Substance Use Topics   Alcohol use: No   Drug use: No   Family History  Problem Relation Age of Onset   Dementia Mother    Coronary artery disease Father    Heart attack Father        deceased at 50   Diabetes Maternal Grandmother    Goiter Maternal Grandmother    Cancer Grandchild        rare type (dying at age 10)   Diabetes Cousin        multiple   Colon cancer Neg Hx    Esophageal cancer Neg Hx    Stomach cancer Neg Hx    Rectal cancer Neg Hx    Colon polyps Neg Hx    Allergies  Allergen Reactions   Codeine Nausea And Vomiting   Hydrocodone-Acetaminophen     REACTION: reaction not known   Penicillins Hives   Current Outpatient Medications on File Prior to Visit  Medication Sig Dispense Refill   Cholecalciferol (VITAMIN D3) 25 MCG (1000 UT) CAPS Take 2,000 Units by mouth daily in the afternoon.     clobetasol (OLUX) 0.05 % topical foam Apply to aa's rash BID PRN. Avoid applying to face, groin, and axilla. Use as directed. Long-term use can cause thinning of the skin. 60 g 5   Cyanocobalamin (VITAMIN B12) 1000 MCG TBCR Take 1,000 mcg by mouth every Monday, Wednesday, and Friday.     halobetasol (ULTRAVATE) 0.05 % cream Apply to aa BID PRN. Avoid applying to face, groin,  and axilla. Use as directed. Long-term use can cause thinning of the skin. 50 g 0   ketoconazole (NIZORAL) 2 % shampoo SHAMPOO INTO THE SCALP. LET SIT 5-10 MINUTES THEN WASH OUT. USE 3 DAYS PER WEEK 120 mL 3   polyethylene glycol powder (GLYCOLAX/MIRALAX) 17 GM/SCOOP powder Take 17 g by mouth as needed for mild constipation or moderate  constipation.     Ruxolitinib Phosphate (OPZELURA) 1.5 % CREA Apply to affected skin qd-bid 60 g 2   tacrolimus (PROTOPIC) 0.1 % ointment Apply topically 2 (two) times daily. To arms 60 g 1   valACYclovir (VALTREX) 1000 MG tablet TAKE TWO TABS BY MOUTH AT FIRST ONSET OF SYMPTOMS OF COLD SORES, THEN TAKE TWO TABLETS BY MOUTH 12 HOURS LATER 30 tablet 10   famotidine (PEPCID) 20 MG tablet Take 1 tablet (20 mg total) by mouth at bedtime. (Patient not taking: Reported on 08/18/2023) 30 tablet 2   levothyroxine (SYNTHROID) 25 MCG tablet Take 25 mcg by mouth daily before breakfast. (Patient not taking: Reported on 08/18/2023)     No current facility-administered medications on file prior to visit.    Review of Systems  Constitutional:  Negative for fatigue and fever.  Musculoskeletal:  Positive for arthralgias. Negative for gait problem and myalgias.  Neurological:  Negative for weakness and numbness.  Psychiatric/Behavioral:  Positive for sleep disturbance.        Objective:   Physical Exam Constitutional:      Appearance: Normal appearance. She is normal weight. She is not ill-appearing or diaphoretic.  Eyes:     Conjunctiva/sclera: Conjunctivae normal.     Pupils: Pupils are equal, round, and reactive to light.  Cardiovascular:     Rate and Rhythm: Regular rhythm. Bradycardia present.     Pulses: Normal pulses.  Pulmonary:     Effort: Pulmonary effort is normal. No respiratory distress.  Musculoskeletal:     Comments: Knee left  No obvious effusion , slight fullness posteriorly No warmth to the touch  No crepitus  ROM:  Flex 90 deg Ext  full Mcmurray-mild discomfort  Bounce test no pain  Stability: Anterior drawer-nl Lachman exam -normal   Tenderness mostly posterior   Gait -normal   Few varicosities behind knee No warmth  No palp cord Neg homan's sign      Skin:    General: Skin is warm and dry.     Coloration: Skin is not jaundiced or pale.     Findings: No bruising, erythema or rash.  Neurological:     Mental Status: She is alert.     Motor: No weakness.     Coordination: Coordination normal.  Psychiatric:        Mood and Affect: Mood normal.           Assessment & Plan:   Problem List Items Addressed This Visit       Musculoskeletal and Integument   Osteoarthritis of both knees   Bilateral  Reviewed xr from 2019  Inter articular and also signs of chondromalacia         Other   Posterior knee pain, left - Primary   In pt with known OA and chondromalacia in both knees  Reviewed xrays from 2019 [facility] in past   Today pain is different and more posterior / with a sensation of fullness  Overall reassuring exam  Discussed possible of baker's cyst caused by the other joint problems   Ordered US of LLE  Pending for further steps   Encouraged her to use voltaren gel up to four times daily over the counter  Ice/cold compress and elevation when able  Compression IF it helps symptoms   Tylenol prn   Call back and Er precautions noted in detail today         Relevant Orders   VAS [place] LOWER EXTREMITY VENOUS (  DVT)

## 2023-08-18 NOTE — Assessment & Plan Note (Signed)
In pt with known OA and chondromalacia in both knees  Reviewed xrays from 2019 Has [facility] in past   Today pain is different and more posterior / with a sensation of fullness  Overall reassuring exam  Discussed possible of baker's cyst caused by the other joint problems   Ordered US of LLE  Pending for further steps   Encouraged her to use voltaren gel up to four times daily over the counter  Ice/cold compress and elevation when able  Compression IF it helps symptoms   Tylenol prn   Call back and Er precautions noted in detail today

## 2023-08-18 NOTE — Assessment & Plan Note (Signed)
Bilateral  Reviewed xr from 2019  Inter articular and also signs of chondromalacia

## 2023-08-19 ENCOUNTER — Ambulatory Visit: Payer: PPO | Attending: Family Medicine

## 2023-08-19 DIAGNOSIS — M25562 Pain in left knee: Secondary | ICD-10-CM | POA: Diagnosis not present

## 2023-08-19 DIAGNOSIS — M7122 Synovial cyst of popliteal space [Baker], left knee: Secondary | ICD-10-CM | POA: Insufficient documentation

## 2023-08-19 NOTE — Addendum Note (Signed)
Addended by: Roxy Manns A on: 08/19/2023 08:23 PM   Modules accepted: Orders

## 2023-08-19 NOTE — Assessment & Plan Note (Signed)
Noted on doppler Likely adding to posterior knee pain

## 2023-08-27 ENCOUNTER — Ambulatory Visit: Payer: PPO | Admitting: Dermatology

## 2023-08-27 ENCOUNTER — Encounter: Payer: Self-pay | Admitting: Dermatology

## 2023-08-27 DIAGNOSIS — L814 Other melanin hyperpigmentation: Secondary | ICD-10-CM | POA: Diagnosis not present

## 2023-08-27 DIAGNOSIS — L820 Inflamed seborrheic keratosis: Secondary | ICD-10-CM

## 2023-08-27 DIAGNOSIS — L821 Other seborrheic keratosis: Secondary | ICD-10-CM

## 2023-08-27 DIAGNOSIS — L570 Actinic keratosis: Secondary | ICD-10-CM | POA: Diagnosis not present

## 2023-08-27 DIAGNOSIS — W908XXA Exposure to other nonionizing radiation, initial encounter: Secondary | ICD-10-CM

## 2023-08-27 NOTE — Patient Instructions (Signed)
 Cryotherapy Aftercare  Wash gently with soap and water everyday.   Apply Vaseline Jelly daily until healed.     Recommend daily broad spectrum sunscreen SPF 30+ to sun-exposed areas, reapply every 2 hours as needed. Call for new or changing lesions.  Staying in the shade or wearing long sleeves, sun glasses (UVA+UVB protection) and wide brim hats (4-inch brim around the entire circumference of the hat) are also recommended for sun protection.      Due to recent changes in healthcare laws, you may see results of your pathology and/or laboratory studies on MyChart before the doctors have had a chance to review them. We understand that in some cases there may be results that are confusing or concerning to you. Please understand that not all results are received at the same time and often the doctors may need to interpret multiple results in order to provide you with the best plan of care or course of treatment. Therefore, we ask that you please give [place] 2 business days to thoroughly review all your results before contacting the office for clarification. Should we see a critical lab result, you will be contacted sooner.   If You Need Anything After Your Visit  If you have any questions or concerns for your doctor, please call our main line at [phone] and press option 4 to reach your doctor's medical assistant. If no one answers, please leave a voicemail as directed and we will return your call as soon as possible. Messages left after 4 pm will be answered the following business day.   You may also send [place] a message via MyChart. We typically respond to MyChart messages within 1-2 business days.  For prescription refills, please ask your pharmacy to contact our office. Our fax number is [phone].  If you have an urgent issue when the clinic is closed that cannot wait until the next business day, you can page your doctor at the number below.    Please note that while we do our best to be  available for urgent issues outside of office hours, we are not available 24/7.   If you have an urgent issue and are unable to reach [place], you may choose to seek medical care at your doctor's office, retail clinic, urgent care center, or emergency room.  If you have a medical emergency, please immediately call 911 or go to the emergency department.  Pager Numbers  - Dr. Gwen Pounds: [phone]  - Dr. Roseanne Reno: [phone]  - Dr. Katrinka Blazing: [phone]   In the event of inclement weather, please call our main line at [phone] for an update on the status of any delays or closures.  Dermatology Medication Tips: Please keep the boxes that topical medications come in in order to help keep track of the instructions about where and how to use these. Pharmacies typically print the medication instructions only on the boxes and not directly on the medication tubes.   If your medication is too expensive, please contact our office at [phone] option 4 or send [place] a message through MyChart.   We are unable to tell what your co-pay for medications will be in advance as this is different depending on your insurance coverage. However, we may be able to find a substitute medication at lower cost or fill out paperwork to get insurance to cover a needed medication.   If a prior authorization is required to get your medication covered by your insurance company, please allow [place] 1-2 business days to complete  this process.  Drug prices often vary depending on where the prescription is filled and some pharmacies may offer cheaper prices.  The website www.goodrx.com contains coupons for medications through different pharmacies. The prices here do not account for what the cost may be with help from insurance (it may be cheaper with your insurance), but the website can give you the price if you did not use any insurance.  - You can print the associated coupon and take it with your prescription to the pharmacy.   - You may also stop by our office during regular business hours and pick up a GoodRx coupon card.  - If you need your prescription sent electronically to a different pharmacy, notify our office through [facility] or by phone at [phone] option 4.     Si Usted Necesita Algo Despus de Su Visita  Tambin puede enviarnos un mensaje a travs de Clinical cytogeneticist. Por lo general respondemos a los mensajes de MyChart en el transcurso de 1 a 2 das hbiles.  Para renovar recetas, por favor pida a su farmacia que se ponga en contacto con nuestra oficina. Annie Sable de fax es [place] [phone].  Si tiene un asunto urgente cuando la clnica est cerrada y que no puede esperar hasta el siguiente da hbil, puede llamar/localizar a su doctor(a) al nmero que aparece a continuacin.   Por favor, tenga en cuenta que aunque hacemos todo lo posible para estar disponibles para asuntos urgentes fuera del horario de [place], no estamos disponibles las 24 horas del da, los 7 [address]  [address] de la [place].   Si tiene un problema urgente y no puede comunicarse con nosotros, puede optar por buscar atencin mdica  en el consultorio de su doctor(a), en una clnica privada, en un centro de atencin urgente o en una sala de emergencias.  Si tiene Engineer, drilling, por favor llame inmediatamente al 911 o vaya a la sala de emergencias.  Nmeros de bper  - Dr. Gwen Pounds: [phone]  - Dra. Roseanne Reno: [phone]  - Dr. Katrinka Blazing: [phone]   En caso de inclemencias del tiempo, por favor llame a Lacy Duverney principal al [phone] para una actualizacin sobre el [place] de cualquier retraso o cierre.  Consejos para la medicacin en dermatologa: Por favor, guarde las cajas en las que vienen los medicamentos de uso tpico para ayudarle a seguir las instrucciones sobre dnde y cmo usarlos. Las farmacias generalmente imprimen las instrucciones del medicamento slo en las cajas y no directamente en los tubos del  [place].   Si su medicamento es muy caro, por favor, pngase en contacto con Rolm Gala llamando al [phone] y presione la opcin 4 o envenos un mensaje a travs de Clinical cytogeneticist.   No podemos decirle cul ser su copago por los medicamentos por adelantado ya que esto es diferente dependiendo de la cobertura de su seguro. Sin embargo, es posible que podamos encontrar un medicamento sustituto a Audiological scientist un formulario para que el seguro cubra el medicamento que se considera necesario.   Si se requiere una autorizacin previa para que su compaa de seguros [place] su medicamento, por favor permtanos de 1 a 2 das hbiles para completar [address]  Los precios de los medicamentos varan con frecuencia dependiendo del Environmental consultant de dnde se surte la receta y alguna farmacias pueden ofrecer precios ms baratos.  El sitio web www.goodrx.com tiene cupones para medicamentos de Health and safety inspector. Los precios aqu no tienen en cuenta lo que podra costar con la ayuda del  seguro (puede ser ms barato con su seguro), pero el sitio web puede darle el precio si no Visual merchandiser.  - Puede imprimir el cupn correspondiente y llevarlo con su receta a la farmacia.  - Tambin puede pasar por nuestra oficina durante el horario de atencin regular y Education officer, museum una tarjeta de cupones de GoodRx.  - Si necesita que su receta se enve electrnicamente a una farmacia diferente, informe a nuestra oficina a travs de MyChart de [place] o por telfono llamando al [phone] y presione la opcin 4.

## 2023-08-27 NOTE — Progress Notes (Signed)
 Follow-Up Visit   Subjective  Morgan [name] is a 78 y.o. female who presents for the following: Spots. Left cheek in front of ear. Dur: several months. Scaly, pink. Has been using the medication she uses for seb. derm on face. Thinks could be HC 2.5% cream.  Irritated spot on jaw.  The patient has spots, moles and lesions to be evaluated, some may be new or changing and the patient may have concern these could be cancer.    The following portions of the chart were reviewed this encounter and updated as appropriate: medications, allergies, medical history  Review of Systems:  No other skin or systemic complaints except as noted in HPI or Assessment and Plan.  Objective  Well appearing patient in no apparent distress; mood and affect are within normal limits.  A focused examination was performed of the following areas: Face   Relevant physical exam findings are noted in the Assessment and Plan.  R jaw x1, L lateral cheek x1 (2) Erythematous keratotic or waxy stuck-on papule or plaque.  1.5 cm pink scaly thin plaque at left lateral cheek. R tip of Nose x1 Erythematous thin papules/macules with gritty scale.   Assessment & Plan   SEBORRHEIC KERATOSIS - Stuck-on, waxy, tan-brown papules and/or plaques  - Benign-appearing - Discussed benign etiology and prognosis. - Observe - Call for any changes  LENTIGINES Exam: scattered tan macules face Due to sun exposure Treatment Plan: Benign-appearing, observe. Recommend daily broad spectrum sunscreen SPF 30+ to sun-exposed areas, reapply every 2 hours as needed.  Call for any changes   INFLAMED SEBORRHEIC KERATOSIS (2) R jaw x1, L lateral cheek x1 (2) Symptomatic, irritating, patient would like treated.   Recheck L lateral cheek at follow up. Destruction of lesion - R jaw x1, L lateral cheek x1 (2)  Destruction method: cryotherapy   Informed consent: discussed and consent obtained   Lesion destroyed using liquid nitrogen:  Yes   Region frozen until ice ball extended beyond lesion: Yes   Outcome: patient tolerated procedure well with no complications   Post-procedure details: wound care instructions given   Additional details:  Prior to procedure, discussed risks of blister formation, small wound, skin dyspigmentation, or rare scar following cryotherapy. Recommend Vaseline ointment to treated areas while healing.  AK (ACTINIC KERATOSIS) R tip of Nose x1 Actinic keratoses are precancerous spots that appear secondary to cumulative UV radiation exposure/sun exposure over time. They are chronic with expected duration over 1 year. A portion of actinic keratoses will progress to squamous cell carcinoma of the skin. It is not possible to reliably predict which spots will progress to skin cancer and so treatment is recommended to prevent development of skin cancer.  Recommend daily broad spectrum sunscreen SPF 30+ to sun-exposed areas, reapply every 2 hours as needed.  Recommend staying in the shade or wearing long sleeves, sun glasses (UVA+UVB protection) and wide brim hats (4-inch brim around the entire circumference of the hat). Call for new or changing lesions. Destruction of lesion - R tip of Nose x1  Destruction method: cryotherapy   Informed consent: discussed and consent obtained   Lesion destroyed using liquid nitrogen: Yes   Region frozen until ice ball extended beyond lesion: Yes   Outcome: patient tolerated procedure well with no complications   Post-procedure details: wound care instructions given   Additional details:  Prior to procedure, discussed risks of blister formation, small wound, skin dyspigmentation, or rare scar following cryotherapy. Recommend Vaseline ointment to treated  areas while healing.    Return for TBSE As Scheduled.  I, Jill Parcell, CMA, am acting as scribe for Rexene Rattler, MD.   Documentation: I have reviewed the above documentation for accuracy and completeness, and I agree  with the above.  Rexene Rattler, MD

## 2023-09-03 DIAGNOSIS — M25561 Pain in right knee: Secondary | ICD-10-CM | POA: Diagnosis not present

## 2023-09-03 DIAGNOSIS — M1712 Unilateral primary osteoarthritis, left knee: Secondary | ICD-10-CM | POA: Diagnosis not present

## 2023-09-24 DIAGNOSIS — R748 Abnormal levels of other serum enzymes: Secondary | ICD-10-CM | POA: Diagnosis not present

## 2023-10-06 DIAGNOSIS — M25562 Pain in left knee: Secondary | ICD-10-CM | POA: Diagnosis not present

## 2023-10-10 DIAGNOSIS — M1712 Unilateral primary osteoarthritis, left knee: Secondary | ICD-10-CM | POA: Diagnosis not present

## 2023-10-21 ENCOUNTER — Encounter: Payer: PPO | Admitting: Dermatology

## 2023-10-28 DIAGNOSIS — S83242S Other tear of medial meniscus, current injury, left knee, sequela: Secondary | ICD-10-CM | POA: Diagnosis not present

## 2023-10-28 DIAGNOSIS — M2242 Chondromalacia patellae, left knee: Secondary | ICD-10-CM | POA: Diagnosis not present

## 2023-11-17 ENCOUNTER — Other Ambulatory Visit: Payer: Self-pay

## 2023-11-17 ENCOUNTER — Encounter (HOSPITAL_BASED_OUTPATIENT_CLINIC_OR_DEPARTMENT_OTHER): Payer: Self-pay | Admitting: Orthopedic Surgery

## 2023-11-21 ENCOUNTER — Ambulatory Visit (HOSPITAL_COMMUNITY)

## 2023-11-21 ENCOUNTER — Ambulatory Visit (HOSPITAL_BASED_OUTPATIENT_CLINIC_OR_DEPARTMENT_OTHER): Payer: Self-pay | Admitting: Anesthesiology

## 2023-11-21 ENCOUNTER — Encounter (HOSPITAL_BASED_OUTPATIENT_CLINIC_OR_DEPARTMENT_OTHER): Payer: Self-pay | Admitting: Orthopedic Surgery

## 2023-11-21 ENCOUNTER — Ambulatory Visit (HOSPITAL_BASED_OUTPATIENT_CLINIC_OR_DEPARTMENT_OTHER)
Admission: RE | Admit: 2023-11-21 | Discharge: 2023-11-21 | Disposition: A | Discharge status: Home / Self Care | Attending: Orthopedic Surgery | Admitting: Orthopedic Surgery

## 2023-11-21 ENCOUNTER — Encounter (HOSPITAL_BASED_OUTPATIENT_CLINIC_OR_DEPARTMENT_OTHER)
Admission: RE | Disposition: A | Discharge status: Home / Self Care | Payer: Self-pay | Source: Home / Self Care | Attending: Orthopedic Surgery

## 2023-11-21 ENCOUNTER — Other Ambulatory Visit: Payer: Self-pay

## 2023-11-21 DIAGNOSIS — S83242A Other tear of medial meniscus, current injury, left knee, initial encounter: Secondary | ICD-10-CM | POA: Insufficient documentation

## 2023-11-21 DIAGNOSIS — M84362A Stress fracture, left tibia, initial encounter for fracture: Secondary | ICD-10-CM

## 2023-11-21 DIAGNOSIS — M84462A Pathological fracture, left tibia, initial encounter for fracture: Secondary | ICD-10-CM | POA: Diagnosis not present

## 2023-11-21 DIAGNOSIS — Z87891 Personal history of nicotine dependence: Secondary | ICD-10-CM | POA: Insufficient documentation

## 2023-11-21 DIAGNOSIS — X58XXXA Exposure to other specified factors, initial encounter: Secondary | ICD-10-CM | POA: Diagnosis not present

## 2023-11-21 DIAGNOSIS — M351 Other overlap syndromes: Secondary | ICD-10-CM | POA: Insufficient documentation

## 2023-11-21 DIAGNOSIS — M94262 Chondromalacia, left knee: Secondary | ICD-10-CM | POA: Diagnosis not present

## 2023-11-21 DIAGNOSIS — K219 Gastro-esophageal reflux disease without esophagitis: Secondary | ICD-10-CM | POA: Insufficient documentation

## 2023-11-21 HISTORY — PX: KNEE ARTHROSCOPY WITH SUBCHONDROPLASTY: SHX6732

## 2023-11-21 HISTORY — PX: KNEE ARTHROSCOPY WITH MEDIAL MENISECTOMY: SHX5651

## 2023-11-21 SURGERY — ARTHROSCOPY, KNEE, WITH MEDIAL MENISCECTOMY
Anesthesia: General | Site: Knee | Laterality: Left

## 2023-11-21 MED ORDER — ONDANSETRON HCL 4 MG/2ML IJ SOLN: 4.0000 mg | Freq: Once | INTRAMUSCULAR | Status: DC | PRN

## 2023-11-21 MED ORDER — GLYCOPYRROLATE 0.2 MG/ML IJ SOLN
INTRAMUSCULAR | Status: DC | PRN
Start: 2023-11-21 — End: 2023-11-21
  Administered 2023-11-21: .1 mg via INTRAVENOUS

## 2023-11-21 MED ORDER — ACETAMINOPHEN 500 MG PO TABS
ORAL_TABLET | ORAL | Status: AC
Start: 2023-11-21 — End: ?
  Filled 2023-11-21: qty 2

## 2023-11-21 MED ORDER — OXYCODONE HCL 5 MG/5ML PO SOLN: 5.0000 mg | Freq: Once | ORAL | Status: DC | PRN

## 2023-11-21 MED ORDER — LIDOCAINE HCL (CARDIAC) PF 100 MG/5ML IV SOSY
PREFILLED_SYRINGE | INTRAVENOUS | Status: DC | PRN
  Administered 2023-11-21: 60 mg via INTRAVENOUS

## 2023-11-21 MED ORDER — CEFAZOLIN SODIUM-DEXTROSE 2-4 GM/100ML-% IV SOLN
2.0000 g | INTRAVENOUS | Status: AC
  Administered 2023-11-21: 2 g via INTRAVENOUS

## 2023-11-21 MED ORDER — CEFAZOLIN SODIUM-DEXTROSE 2-4 GM/100ML-% IV SOLN
INTRAVENOUS | Status: AC
  Filled 2023-11-21: qty 100

## 2023-11-21 MED ORDER — SODIUM CHLORIDE 0.9 % IR SOLN
Status: DC | PRN
  Administered 2023-11-21: 2000 mL

## 2023-11-21 MED ORDER — LACTATED RINGERS IV SOLN: INTRAVENOUS | Status: DC

## 2023-11-21 MED ORDER — OXYCODONE HCL 5 MG PO TABS: 5.0000 mg | ORAL_TABLET | Freq: Once | ORAL | Status: DC | PRN

## 2023-11-21 MED ORDER — ONDANSETRON HCL 4 MG PO TABS: 4.0000 mg | ORAL_TABLET | Freq: Three times a day (TID) | ORAL | 0 refills | Status: DC | PRN

## 2023-11-21 MED ORDER — FENTANYL CITRATE (PF) 100 MCG/2ML IJ SOLN
INTRAMUSCULAR | Status: DC | PRN
Start: 2023-11-21 — End: 2023-11-21
  Administered 2023-11-21 (×2): 25 ug via INTRAVENOUS

## 2023-11-21 MED ORDER — ONDANSETRON HCL 4 MG/2ML IJ SOLN
INTRAMUSCULAR | Status: DC | PRN
  Administered 2023-11-21: 4 mg via INTRAVENOUS

## 2023-11-21 MED ORDER — OXYCODONE HCL 5 MG PO TABS
5.0000 mg | ORAL_TABLET | ORAL | 0 refills | Status: DC | PRN
Start: 2023-11-21 — End: 2024-02-24

## 2023-11-21 MED ORDER — DEXAMETHASONE SODIUM PHOSPHATE 10 MG/ML IJ SOLN
INTRAMUSCULAR | Status: AC
  Filled 2023-11-21: qty 1

## 2023-11-21 MED ORDER — LIDOCAINE 2% (20 MG/ML) 5 ML SYRINGE
INTRAMUSCULAR | Status: AC
  Filled 2023-11-21: qty 5

## 2023-11-21 MED ORDER — BUPIVACAINE HCL 0.25 % IJ SOLN
INTRAMUSCULAR | Status: DC | PRN
  Administered 2023-11-21: 20 mL

## 2023-11-21 MED ORDER — ONDANSETRON HCL 4 MG/2ML IJ SOLN
INTRAMUSCULAR | Status: AC
  Filled 2023-11-21: qty 2

## 2023-11-21 MED ORDER — ACETAMINOPHEN 500 MG PO TABS
1000.0000 mg | ORAL_TABLET | Freq: Once | ORAL | Status: AC
  Administered 2023-11-21: 1000 mg via ORAL

## 2023-11-21 MED ORDER — FENTANYL CITRATE (PF) 100 MCG/2ML IJ SOLN
INTRAMUSCULAR | Status: AC
  Filled 2023-11-21: qty 2

## 2023-11-21 MED ORDER — EPHEDRINE SULFATE (PRESSORS) 50 MG/ML IJ SOLN
INTRAMUSCULAR | Status: DC | PRN
  Administered 2023-11-21 (×2): 5 mg via INTRAVENOUS

## 2023-11-21 MED ORDER — FENTANYL CITRATE (PF) 100 MCG/2ML IJ SOLN
25.0000 ug | INTRAMUSCULAR | Status: DC | PRN
  Administered 2023-11-21 (×2): 50 ug via INTRAVENOUS

## 2023-11-21 MED ORDER — DEXAMETHASONE SODIUM PHOSPHATE 4 MG/ML IJ SOLN
INTRAMUSCULAR | Status: DC | PRN
  Administered 2023-11-21: 5 mg via INTRAVENOUS

## 2023-11-21 MED ORDER — PROPOFOL 10 MG/ML IV BOLUS
INTRAVENOUS | Status: DC | PRN
  Administered 2023-11-21: 100 mg via INTRAVENOUS
  Administered 2023-11-21: 50 mg via INTRAVENOUS

## 2023-11-21 MED ORDER — PROPOFOL 10 MG/ML IV BOLUS
INTRAVENOUS | Status: AC
  Filled 2023-11-21: qty 20

## 2023-11-21 SURGICAL SUPPLY — 25 items
BLADE SHAVER TORPEDO 4X13 (MISCELLANEOUS) ×1 IMPLANT
BNDG ELASTIC 6INX 5YD STR LF (GAUZE/BANDAGES/DRESSINGS) ×1 IMPLANT
CANNULA ACCUPORT 11G X 120 (CANNULA) IMPLANT
CLSR STERI-STRIP ANTIMIC 1/2X4 (GAUZE/BANDAGES/DRESSINGS) ×1 IMPLANT
COVER MAYO STAND STRL (DRAPES) ×1 IMPLANT
DRAPE C-ARM 35X43 STRL (DRAPES) ×1 IMPLANT
DRAPE U-SHAPE 47X51 STRL (DRAPES) ×1 IMPLANT
DRAPE-T ARTHROSCOPY W/POUCH (DRAPES) ×1 IMPLANT
DURAPREP 26ML APPLICATOR (WOUND CARE) ×1 IMPLANT
GAUZE PAD ABD 8X10 STRL (GAUZE/BANDAGES/DRESSINGS) ×1 IMPLANT
GAUZE SPONGE 4X4 12PLY STRL (GAUZE/BANDAGES/DRESSINGS) ×1 IMPLANT
GLOVE BIO SURGEON STRL SZ7.5 (GLOVE) ×2 IMPLANT
GLOVE BIOGEL PI IND STRL 8 (GLOVE) ×2 IMPLANT
GOWN STRL REUS W/ TWL LRG LVL3 (GOWN DISPOSABLE) ×1 IMPLANT
GOWN STRL REUS W/TWL XL LVL3 (GOWN DISPOSABLE) ×2 IMPLANT
GRAFT BONE ACCUFILL INJECT 3ML (Graft) IMPLANT
KIT MIXER ACCUMIX (KITS) IMPLANT
MANIFOLD NEPTUNE II (INSTRUMENTS) ×1 IMPLANT
PACK ARTHROSCOPY DSU (CUSTOM PROCEDURE TRAY) ×1 IMPLANT
PACK BASIN DAY SURGERY FS (CUSTOM PROCEDURE TRAY) ×1 IMPLANT
SHEET MEDIUM DRAPE 40X70 STRL (DRAPES) ×1 IMPLANT
SUT MNCRL AB 3-0 PS2 18 (SUTURE) ×1 IMPLANT
TOWEL GREEN STERILE FF (TOWEL DISPOSABLE) ×1 IMPLANT
TUBE CONNECTING 20X1/4 (TUBING) IMPLANT
TUBING ARTHROSCOPY IRRIG 16FT (MISCELLANEOUS) ×1 IMPLANT

## 2023-11-21 NOTE — Brief Op Note (Signed)
 11/21/2023  12:54 PM  PATIENT:  Raymon Caldron  78 y.o. female  PRE-OPERATIVE DIAGNOSIS:  Left knee medial meniscus tear, medial tibia stress fracture  POST-OPERATIVE DIAGNOSIS:  Left knee medial meniscus tear, medial tibia stress fracture  PROCEDURE:  Procedure(s): ARTHROSCOPY,LEFT KNEE, WITH MEDIAL MENISCECTOMY (Left) ARTHROSCOPY, LEFT KNEE, WITH SUBCHONDROPLASTY (Left)  SURGEON:  Surgeons and Role:    * Janeth Medicus, MD - Primary  PHYSICIAN ASSISTANT: Karyl Paget, PA-C   ANESTHESIA:   local and general  EBL:  10 cc  BLOOD ADMINISTERED:none  DRAINS: none   LOCAL MEDICATIONS USED:  MARCAINE      SPECIMEN:  No Specimen  DISPOSITION OF SPECIMEN:  N/A  COUNTS:  YES  TOURNIQUET:  * No tourniquets in log *  DICTATION: .Note written in EPIC  PLAN OF CARE: Discharge to home after PACU  PATIENT DISPOSITION:  PACU - hemodynamically stable.   Delay start of Pharmacological VTE agent (>24hrs) due to surgical blood loss or risk of bleeding: not applicable

## 2023-11-21 NOTE — Anesthesia Preprocedure Evaluation (Addendum)
 Anesthesia Evaluation  Patient identified by MRN, date of birth, ID band Patient awake    Reviewed: Allergy & Precautions, NPO status , Patient's Chart, lab work & pertinent test results  History of Anesthesia Complications Negative for: history of anesthetic complications  Airway Mallampati: II  TM Distance: >3 FB Neck ROM: Full    Dental  (+) Dental Advisory Given, Upper Dentures, Partial Lower   Pulmonary former smoker   Pulmonary exam normal        Cardiovascular negative cardio ROS Normal cardiovascular exam     Neuro/Psych  Tinnitus   negative psych ROS   GI/Hepatic Neg liver ROS,GERD  Medicated and Controlled,,  Endo/Other  Hypothyroidism    Renal/GU negative Renal ROS     Musculoskeletal  (+) Arthritis , Osteoarthritis,   Mixed connective tissue disease    Abdominal   Peds  Hematology negative hematology ROS (+)   Anesthesia Other Findings   Reproductive/Obstetrics                             Anesthesia Physical Anesthesia Plan  ASA: 2  Anesthesia Plan: General   Post-op Pain Management: Tylenol  PO (pre-op)*   Induction: Intravenous  PONV Risk Score and Plan: 3 and Treatment may vary due to age or medical condition, Ondansetron  and Propofol  infusion  Airway Management Planned: LMA  Additional Equipment: None  Intra-op Plan:   Post-operative Plan: Extubation in OR  Informed Consent:   Plan Discussed with: CRNA and Anesthesiologist  Anesthesia Plan Comments:        Anesthesia Quick Evaluation

## 2023-11-21 NOTE — Discharge Instructions (Addendum)
 Post-operative patient instructions  Knee Arthroscopy   Ice:  Place intermittent ice or cooler pack over your knee, 30 minutes on and 30 minutes off.  Continue this for the first 72 hours after surgery, then save ice for use after therapy sessions or on more active days.   Weight:  You may bear weight on your leg as your symptoms allow. DVT prevention: Perform ankle pumps as able throughout the day on the operative extremity.  Be mobile as possible with ambulation as able.  You should also take an 81 mg aspirin twice per day x6 weeks. Crutches:  Use crutches (or walker) to assist in walking until told to discontinue by your physical therapist or physician. This will help to reduce pain. Strengthening:  Perform simple thigh squeezes (isometric quad contractions) and straight leg lifts as you are able (3 sets of 5 to 10 repetitions, 3 times a day).  For the leg lifts, have someone support under your ankle in the beginning until you have increased strength enough to do this on your own.  To help get started on thigh squeezes, place a pillow under your knee and push down on the pillow with back of knee (sometimes easier to do than with your leg fully straight). Motion:  Perform gentle knee motion as tolerated - this is gentle bending and straightening of the knee. Seated heel slides: you can start by sitting in a chair, remove your brace, and gently slide your heel back on the floor - allowing your knee to bend. Have someone help you straighten your knee (or use your other leg/foot hooked under your ankle.  Dressing:  Perform 1st dressing change at 3 days postoperative. A moderate amount of blood tinged drainage is to be expected.  So if you bleed through the dressing on the first or second day or if you have fevers, it is fine to change the dressing/check the wounds early and redress wound. Elevate your leg.  If it bleeds through again, or if the incisions are leaking frank blood, please call the office. May  change dressing every 1-2 days thereafter to help watch wounds. Can purchase Tegderm (or 38M Nexcare) water resistant dressings at local pharmacy / [facility]. Shower:  Light shower is ok after 3 days.  Please take shower, NO bath. Recover with gauze and ace wrap to help keep wounds protected.   Pain medication:  A narcotic pain medication has been prescribed.  Take as directed.  Typically you need narcotic pain medication more regularly during the first 3 to 5 days after surgery.  Decrease your use of the medication as the pain improves.  Narcotics can sometimes cause constipation, even after a few doses.  If you have problems with constipation, you can take an over the counter stool softener or light laxative.  If you have persistent problems, please notify your physician's office. Physical therapy: Additional activity guidelines to be provided by your physician or physical therapist at follow-up visits.  Driving: Do not recommend driving x 1-2 weeks post surgical, especially if surgery performed on right side. Should not drive while taking narcotic pain medications. It typically takes at least 2 weeks to restore sufficient neuromuscular function for normal reaction times for driving safety.  Call [phone] for questions or problems. Evenings you will be forwarded to the hospital operator.  Ask for the orthopaedic physician on call. Please call if you experience:    Redness, foul smelling, or persistent drainage from the surgical site  worsening knee pain and  swelling not responsive to medication  any calf pain and or swelling of the lower leg  temperatures greater than 101.5 F other questions or concerns   Thank you for allowing us  to be a part of your care     Post Anesthesia Home Care Instructions  Activity: Get plenty of rest for the remainder of the day. A responsible individual must stay with you for 24 hours following the procedure.  For the next 24 hours, DO NOT: -Drive a  car -Advertising copywriter -Drink alcoholic beverages -Take any medication unless instructed by your physician -Make any legal decisions or sign important papers.  Meals: Start with liquid foods such as gelatin or soup. Progress to regular foods as tolerated. Avoid greasy, spicy, heavy foods. If nausea and/or vomiting occur, drink only clear liquids until the nausea and/or vomiting subsides. Call your physician if vomiting continues.  Special Instructions/Symptoms: Your throat may feel dry or sore from the anesthesia or the breathing tube placed in your throat during surgery. If this causes discomfort, gargle with warm salt water. The discomfort should disappear within 24 hours.  If you had a scopolamine  patch placed behind your ear for the management of post- operative nausea and/or vomiting:  1. The medication in the patch is effective for 72 hours, after which it should be removed.  Wrap patch in a tissue and discard in the trash. Wash hands thoroughly with soap and water. 2. You may remove the patch earlier than 72 hours if you experience unpleasant side effects which may include dry mouth, dizziness or visual disturbances. 3. Avoid touching the patch. Wash your hands with soap and water after contact with the patch.  No tylenol  until after 5:15pm if needed

## 2023-11-21 NOTE — Op Note (Addendum)
 Surgeon(s): Selinda Belvie Gosling, MD  Assistant: Dayle Moores, PA-C  Assistant attestation:  PA McClung present for the entire procedure.  He was critical for prepping, draping, manipulation of the knee during meniscectomy and for administering the internal fixation device, as well as closure.    ANESTHESIA:  general, and regional   FLUIDS: Per anesthesia record.    ESTIMATED BLOOD LOSS: minimal     PREOPERATIVE DIAGNOSES:  1.  Left knee medial meniscus tear 2.  Left medial tibial plateau insufficiency fracture 3.   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   POSTOPERATIVE DIAGNOSES:  same   PROCEDURES PERFORMED:  1.  Left knee arthroscopically aided treatment of medial tibial plateau stress fracture with percutaneous internal fixation (subchondroplasty)  2.  Left knee arthroscopy with arthroscopic partial medial meniscectomy 3.  Left knee arthroscopic chondroplasty medial femoral condyle   Implant: Flowable calcium phosphate, 3 mL. Zimmer   DESCRIPTION OF PROCEDURE: The patient has a left knee medial meniscus tear. They have had pain that has been refractory to conservative management. Their preoperative MRI demonstrated subchondral bone marrow edema and insufficiency fractures of the medial tibial plateau as well as the medial meniscus tear. Plans are to proceed with partial medial meniscectomy, internal fixation of subchondral insufficiency fractures with flowable calcium phosphate, and diagnostic arthroscopy with debridement as indicated. Full discussion held regarding risks benefits alternatives and complications related surgical intervention. Conservative care options reviewed. All questions answered.   The patient was identified in the preoperative holding area and the operative extremity was marked. The patient was brought to the operating room and transferred to operating table in a supine position. Satisfactory general  anesthesia was induced by anesthesiology.     Standard anterolateral, anteromedial arthroscopy portals were obtained. The anteromedial portal was obtained with a spinal needle for localization under direct visualization with subsequent diagnostic findings.    Anteromedial and anterolateral chambers: moderate synovitis. The synovitis was debrided with a 4.5 mm full radius shaver through both the anteromedial and lateral portals.    Suprapatellar pouch and gutters: mild synovitis or debris. Patella chondral surface: Grade 1 Trochlear chondral surface: Grade 3 Patellofemoral tracking: level Medial meniscus: posterior horn and mid body complex degenerative tearing.  Medial femoral condyle flexion bearing surface: Grade 3 Medial femoral condyle extension bearing surface: Grade 2 Medial tibial plateau: Grade 2 Anterior cruciate ligament:stable Posterior cruciate ligament:stable Lateral meniscus: no tear.   Lateral femoral condyle flexion bearing surface: Grade 1 Lateral femoral condyle extension bearing surface: Grade 0 Lateral tibial plateau: Grade 1   Medial meniscus tear was debrided using biters and motorized shaver alternating until a stable remnant was left. Upon completion the probe was used to evaluate and assess the remaining meniscus which was gleaned to be stable.   Chondroplasty was achieved on the medial femoral condyle, using a motorized shaver to debride the grade 3 unstable cartilage. Completion of the chondroplasty left A medial femoral condyle with smooth stable surface. There was no full-thickness component noted.   Next we turned our attention to the internal fixation of the medial tibial condyle. Arthroscopically we evaluated medial tibial condyle noted there was no loose cartilage or debris surrounding the lesion and the fracture did not propagate to the joint surface. Using preoperative MRI we targeted the delivery device to just under the subchondral density and in the  midportion of the medial tibial plateau. This was achieved with intraoperative fluoroscopy. Once accurate placement was noted on 2  views and confirmed we delivered 3 mL of flowable calcium phosphate into the stress fracture lesion. We left the cannulas in place for approximately 8 minutes while the implant hardened. We removed the cannulas and again took 2 views of fluoroscopic pictures to confirm there was no extravasation outside of the bone. There was none noted.   After completion of synovectomy, diagnostic exam, and debridements as described, all compartments were checked and no residual debris remained. Hemostasis was achieved with the cautery wand. The portals were approximated with nylon suture. All excess fluid was expressed from the joint.  Xeroform sterile gauze dressings were applied followed by Ace bandage and ice pack.    There were no immediate competitions and all counts were correct.   DISPOSITION: The patient was awakened from general anesthetic, extubated, taken to the recovery room in medically stable condition, no apparent complications. The patient may be weightbearing as tolerated to the operative lower extremity with crutches.  Range of motion of right knee as tolerated.  They will use bid asa for DVT ppx, for 6 weeks, and return in 2 weeks for suture removal.   Selinda Belvie Gosling, MD Orthopedic surgeon

## 2023-11-21 NOTE — H&P (Signed)
 ORTHOPAEDIC H and P  REQUESTING PHYSICIAN: Janeth Medicus, MD  PCP:  Clemens Curt, MD  Chief Complaint: Left knee pain  HPI: Morgan [name] is a 78 y.o. female who complains of persistent and now recalcitrant left knee pain due to conservative treatment measures.  Here today for left knee arthroscopic medial meniscectomy as well as chondroplasty of the medial tibial plateau.  No new complaints at this time.  Past Medical History:  Diagnosis Date   Allergy    Bradycardia    Collagen vascular disease (HCC)    Connective tissue disease (HCC)    COVID-19    DDD (degenerative disc disease), cervical    DDD (degenerative disc disease), lumbar    Endometrial polyp    not resolved with D&C (following)   GERD (gastroesophageal reflux disease)    gas/belching issues-was on protonix for short time-not since 6 months   HLD (hyperlipidemia)    Mixed connective tissue disease (HCC) diagnosed 5 years ago   no problems since diagnosis   Multiple thyroid  nodules 01/24/2014   Obesity    Osteoarthritis of cervical spine    Renal calculi    Spinal stenosis    Past Surgical History:  Procedure Laterality Date   APPENDECTOMY     CHOLECYSTECTOMY N/A 08/22/2021   Procedure: LAPAROSCOPIC CHOLECYSTECTOMY;  Surgeon: Oza Blumenthal, MD;  Location: [facility];  Service: General;  Laterality: N/A;   COLONOSCOPY     DILATION AND CURETTAGE OF UTERUS     fibroids   DILATION AND CURETTAGE OF UTERUS  01/2005   endometrial polyp   ENDOMETRIAL BIOPSY  2008   fibroid   ENDOSCOPIC RETROGRADE CHOLANGIOPANCREATOGRAPHY (ERCP) WITH PROPOFOL  N/A 01/21/2022   Procedure: ENDOSCOPIC RETROGRADE CHOLANGIOPANCREATOGRAPHY (ERCP) WITH PROPOFOL ;  Surgeon: Kenney Peacemaker, MD;  Location: [facility] END[facility]COPY;  Service: Gastroenterology;  Laterality: N/A;   HAND SURGERY     carpal tunnel release bilateral Dr. Lorena Rolling   hysteroscopic resection      polyps of PMB   REMOVAL OF STONES  01/21/2022    Procedure: REMOVAL OF STONES;  Surgeon: Kenney Peacemaker, MD;  Location: [facility] ENDOSCOPY;  Service: Gastroenterology;;   [address]  01/21/2022   Procedure: [address];  Surgeon: Kenney Peacemaker, MD;  Location: [facility] ENDOSCOPY;  Service: Gastroenterology;;   TONSILLECTOMY     TUBAL LIGATION     1973   UPPER GASTROINTESTINAL ENDOSCOPY     Social History   Socioeconomic History   Marital status: Married    Spouse name: Not on file   Number of children: 4   Years of education: HS   Highest education level: Not on file  Occupational History   Occupation: Retired   Tobacco Use   Smoking status: Former    Current packs/day: 0.00    Types: Cigarettes    Quit date: 07/22/1965    Years since quitting: 58.3   Smokeless tobacco: Never   Tobacco comments:    Smoked for 2 years as a teen  Advertising account planner   Vaping status: Never Used  Substance and Sexual Activity   Alcohol use: No   Drug use: No   Sexual activity: Yes    Birth control/protection: Surgical    Comment: BTL  Other Topics Concern   Not on file  Social History Narrative   + caffeine use     Social Drivers of Health   Financial Resource Strain: Low Risk  (01/21/2023)   Overall Financial Resource Strain (CARDIA)  Difficulty of Paying Living Expenses: Not hard at all  Food Insecurity: Low Risk  (09/24/2023)   Received from [facility]   Hunger Vital Sign    Worried About Running Out of Food in the Last Year: Never true    Ran Out of Food in the Last Year: Never true  Transportation Needs: No Transportation Needs (09/24/2023)   Received from [facility]    In the past 12 months, has lack of reliable transportation kept you from medical appointments, meetings, work or from getting things needed for daily living? : No  Physical Activity: Insufficiently Active (01/21/2023)   Exercise Vital Sign    Days of Exercise per Week: 3 days    Minutes of Exercise per Session: 30 min  Stress: No Stress Concern Present  (01/21/2023)   [employer] of Occupational Health - Occupational Stress Questionnaire    Feeling of Stress : Only a little  Social Connections: Moderately Integrated (01/21/2023)   Social Connection and Isolation Panel [NHANES]    Frequency of Communication with Friends and Family: More than three times a week    Frequency of Social Gatherings with Friends and Family: Three times a week    Attends Religious Services: 1 to 4 times per year    Active Member of Clubs or Organizations: No    Attends Engineer, structural: Never    Marital Status: Married   Family History  Problem Relation Age of Onset   Dementia Mother    Coronary artery disease Father    Heart attack Father        deceased at 9   Diabetes Maternal Grandmother    Goiter Maternal Grandmother    Cancer Grandchild        rare type (dying at age 48)   Diabetes Cousin        multiple   Colon cancer Neg Hx    Esophageal cancer Neg Hx    Stomach cancer Neg Hx    Rectal cancer Neg Hx    Colon polyps Neg Hx    Allergies  Allergen Reactions   Codeine Nausea And Vomiting   Hydrocodone-Acetaminophen      REACTION: reaction not known   Penicillins Hives   Prior to Admission medications   Medication Sig Start Date End Date Taking? Authorizing Provider  Cholecalciferol (VITAMIN D3) 25 MCG (1000 UT) CAPS Take 2,000 Units by mouth daily in the afternoon.   Yes [provider]  Cyanocobalamin  (VITAMIN B12) 1000 MCG TBCR Take 1,000 mcg by mouth every Monday, Wednesday, and Friday.   Yes [provider]  omeprazole  (PRILOSEC) 40 MG capsule Take 40 mg by mouth daily. 07/02/23  Yes [provider]  polyethylene glycol powder (GLYCOLAX /MIRALAX ) 17 GM/SCOOP powder Take 17 g by mouth as needed for mild constipation or moderate constipation.   Yes [provider]  clobetasol  (OLUX ) 0.05 % topical foam Apply to aa's rash BID PRN. Avoid applying to face, groin, and axilla. Use as directed.  Long-term use can cause thinning of the skin. 12/30/22   Artemio Larry, MD  famotidine  (PEPCID ) 20 MG tablet Take 1 tablet (20 mg total) by mouth at bedtime. Patient not taking: Reported on 08/18/2023 10/23/21   Kennedy-Smith, Colleen M, NP  halobetasol  (ULTRAVATE ) 0.05 % cream Apply to aa BID PRN. Avoid applying to face, groin, and axilla. Use as directed. Long-term use can cause thinning of the skin. 04/09/23   Artemio Larry, MD  ketoconazole  (NIZORAL ) 2 % shampoo  SHAMPOO INTO THE SCALP. LET SIT 5-10 MINUTES THEN WASH OUT. USE 3 DAYS PER WEEK 08/05/22   Moye, Virginia , MD  levothyroxine  (SYNTHROID ) 25 MCG tablet fracture Take 25 mcg by mouth daily before breakfast. Patient not taking: Reported on 08/18/2023    [provider]  Ruxolitinib Phosphate  (OPZELURA ) 1.5 % CREA Apply to affected skin qd-bid 04/17/23   Artemio Larry, MD  tacrolimus  (PROTOPIC ) 0.1 % ointment Apply topically 2 (two) times daily. To arms 12/18/22   Moye, Virginia , MD  valACYclovir  (VALTREX ) 1000 MG tablet TAKE TWO TABS BY MOUTH AT FIRST ONSET OF SYMPTOMS OF COLD SORES, THEN TAKE TWO TABLETS BY MOUTH 12 HOURS LATER 01/14/23   Artemio Larry, MD   No results found.  Positive ROS: All other systems have been reviewed and were otherwise negative with the exception of those mentioned in the HPI and as above.  Physical Exam: General: Alert, no acute distress Cardiovascular: No pedal edema Respiratory: No cyanosis, no use of accessory musculature GI: No organomegaly, abdomen is soft and non-tender Skin: No lesions in the area of chief complaint Neurologic: Sensation intact distally Psychiatric: Patient is competent for consent with normal mood and affect Lymphatic: No axillary or cervical lymphadenopathy  MUSCULOSKELETAL: Left lower extremity is warm and well-perfused without lesions.  Assessment: 1.  Left knee complex tear medial meniscus 2.  Left knee medial tibial plateau stress  Plan: Plan to proceed today.   Surgical left knee with partial medial discectomy as well as internal fixation also chondroplasty of the medial tibial plateau stress fracture.  Discussed the risk of bleeding, infection, damage to surrounding nerves and vessels.  He is persistent pain and progression of arthritis as well as risk of anesthesia and DVT.  Plan for discharge on postop PACU.  She was provided informed consent.    Janeth Medicus, MD Cell [phone]    11/21/2023 10:50 AM

## 2023-11-21 NOTE — Anesthesia Procedure Notes (Signed)
 Procedure Name: LMA Insertion Date/Time: 11/21/2023 12:19 PM  Performed by: Lucky Sable, CRNAPre-anesthesia Checklist: Patient identified, Emergency Drugs available, Suction available, Patient being monitored and Timeout performed Patient Re-evaluated:Patient Re-evaluated prior to induction Oxygen Delivery Method: Circle system utilized Preoxygenation: Pre-oxygenation with 100% oxygen Induction Type: IV induction Ventilation: Mask ventilation without difficulty LMA: LMA inserted LMA Size: 4.0 Number of attempts: 1 Airway Equipment and Method: Bite block Placement Confirmation: positive ETCO2, breath sounds checked- equal and bilateral and CO2 detector Tube secured with: Tape Dental Injury: Teeth and Oropharynx as per pre-operative assessment

## 2023-11-21 NOTE — Transfer of Care (Signed)
 Immediate Anesthesia Transfer of Care Note  Patient: Morgan [name]  Procedure(s) Performed: Procedure(s) (LRB): ARTHROSCOPY,LEFT KNEE, WITH MEDIAL MENISCECTOMY (Left) ARTHROSCOPY, LEFT KNEE, WITH SUBCHONDROPLASTY (Left)  Patient Location: PACU  Anesthesia Type: GA  Level of Consciousness: awake, sedated, patient cooperative and responds to stimulation, sleepy stable   Airway & Oxygen Therapy: Patient Spontanous Breathing and Patient connected to [place] oxygen  Post-op Assessment: Report given to PACU RN, Post -op Vital signs reviewed and stable and Patient sleepy   Post vital signs: Reviewed and stable  Complications: No apparent anesthesia complications

## 2023-11-24 ENCOUNTER — Encounter (HOSPITAL_BASED_OUTPATIENT_CLINIC_OR_DEPARTMENT_OTHER): Payer: Self-pay | Admitting: Orthopedic Surgery

## 2023-11-24 ENCOUNTER — Encounter: Admitting: Dermatology

## 2023-11-24 NOTE — Anesthesia Postprocedure Evaluation (Signed)
 Anesthesia Post Note  Patient: Morgan [name]  Procedure(s) Performed: ARTHROSCOPY,LEFT KNEE, WITH MEDIAL MENISCECTOMY (Left: Knee) ARTHROSCOPY, LEFT KNEE, WITH SUBCHONDROPLASTY (Left: Knee)     Patient location during evaluation: PACU Anesthesia Type: General Level of consciousness: awake and alert Pain management: pain level controlled Vital Signs Assessment: post-procedure vital signs reviewed and stable Respiratory status: spontaneous breathing, nonlabored ventilation and respiratory function stable Cardiovascular status: blood pressure returned to baseline and stable Postop Assessment: no apparent nausea or vomiting Anesthetic complications: no   No notable events documented.  Last Vitals:  Vitals:   11/21/23 1345 11/21/23 1434  BP: (!) 170/85 (!) 168/85  Pulse: 63 (!) 57  Resp: 15 16  Temp:  36.5 C  SpO2: 99% 99%    Last Pain:                 Erin Havers

## 2023-12-03 DIAGNOSIS — G8929 Other chronic pain: Secondary | ICD-10-CM | POA: Diagnosis not present

## 2023-12-03 DIAGNOSIS — M25562 Pain in left knee: Secondary | ICD-10-CM | POA: Diagnosis not present

## 2023-12-09 DIAGNOSIS — M25562 Pain in left knee: Secondary | ICD-10-CM | POA: Diagnosis not present

## 2023-12-09 DIAGNOSIS — G8929 Other chronic pain: Secondary | ICD-10-CM | POA: Diagnosis not present

## 2023-12-24 DIAGNOSIS — G8929 Other chronic pain: Secondary | ICD-10-CM | POA: Diagnosis not present

## 2023-12-24 DIAGNOSIS — M25562 Pain in left knee: Secondary | ICD-10-CM | POA: Diagnosis not present

## 2023-12-31 DIAGNOSIS — M25562 Pain in left knee: Secondary | ICD-10-CM | POA: Diagnosis not present

## 2023-12-31 DIAGNOSIS — G8929 Other chronic pain: Secondary | ICD-10-CM | POA: Diagnosis not present

## 2024-01-18 ENCOUNTER — Telehealth: Payer: Self-pay | Admitting: Family Medicine

## 2024-01-18 DIAGNOSIS — E7800 Pure hypercholesterolemia, unspecified: Secondary | ICD-10-CM

## 2024-01-18 DIAGNOSIS — E559 Vitamin D deficiency, unspecified: Secondary | ICD-10-CM

## 2024-01-18 DIAGNOSIS — Z79899 Other long term (current) drug therapy: Secondary | ICD-10-CM

## 2024-01-18 DIAGNOSIS — E039 Hypothyroidism, unspecified: Secondary | ICD-10-CM

## 2024-01-18 DIAGNOSIS — E161 Other hypoglycemia: Secondary | ICD-10-CM

## 2024-01-18 NOTE — Telephone Encounter (Signed)
-----   Message from Veva JINNY Ferrari sent at 01/07/2024  2:51 PM EDT ----- Regarding: Lab orders for Tue, 7.8.25 Patient is scheduled for CPX labs, please order future labs, Thanks , Terri ----- Message ----- From: Ferrari Veva JINNY Sent: 01/07/2024   2:51 PM EDT To: Anton Blas, MD Subject: Lab orders for Tue, 7.8.25                     Lab orders, thanks

## 2024-01-22 ENCOUNTER — Ambulatory Visit: Payer: PPO

## 2024-01-22 VITALS — Ht 60.0 in | Wt 139.0 lb

## 2024-01-22 DIAGNOSIS — Z0000 Encounter for general adult medical examination without abnormal findings: Secondary | ICD-10-CM | POA: Diagnosis not present

## 2024-01-22 NOTE — Patient Instructions (Signed)
 Ms. Veronica , Thank you for taking time out of your busy schedule to complete your Annual Wellness Visit with me. I enjoyed our conversation and look forward to speaking with you again next year. I, as well as your care team,  appreciate your ongoing commitment to your health goals. Please review the following plan we discussed and let me know if I can assist you in the future. Your Game plan/ To Do List     Follow up Visits: Next Medicare AWV with our clinical staff: 01/26/25 @ 8:50am    Have you seen your provider in the last 6 months (3 months if uncontrolled diabetes)? Yes Next Office Visit with your provider: 02/18/24  Clinician Recommendations:  Aim for 30 minutes of exercise or brisk walking, 6-8 glasses of water, and 5 servings of fruits and vegetables each day.       This is a list of the screening recommended for you and due dates:  Health Maintenance  Topic Date Due   COVID-19 Vaccine (1) Never done   Medicare Annual Wellness Visit  01/21/2024   Colon Cancer Screening  04/12/2024   Flu Shot  02/20/2024   Mammogram  05/27/2024   DTaP/Tdap/Td vaccine (4 - Td or Tdap) 05/29/2027   Pneumococcal Vaccine for age over 70  Completed   DEXA scan (bone density measurement)  Completed   Hepatitis C Screening  Completed   Zoster (Shingles) Vaccine  Completed   Hepatitis B Vaccine  Aged Out   HPV Vaccine  Aged Out   Meningitis B Vaccine  Aged Out    Advanced directives: (Copy Requested) Please bring a copy of your health care power of attorney and living will to the office to be added to your chart at your convenience. You can mail to [facility] [address] [name], [place] [address] or email to [email or web address] Advance Care Planning is important because it:  [x]  Makes sure you receive the medical care that is consistent with your values, goals, and preferences  [x]  It provides guidance to your family and loved ones and reduces their decisional burden  about whether or not they are making the right decisions based on your wishes.  Follow the link provided in your after visit summary or read over the paperwork we have mailed to you to help you started getting your Advance Directives in place. If you need assistance in completing these, please reach out to us  so that we can help you!

## 2024-01-22 NOTE — Progress Notes (Signed)
 Subjective:   Morgan [name] is a 78 y.o. who presents for a Medicare Wellness preventive visit.  As a reminder, Annual Wellness Visits don't include a physical exam, and some assessments may be limited, especially if this visit is performed virtually. We may recommend an in-person follow-up visit with your provider if needed.  Visit Complete: Virtual I connected with  Morgan [name] on 01/22/24 by a audio enabled telemedicine application and verified that I am speaking with the correct person using two identifiers.  Patient Location: Home  Provider Location: Home Office  I discussed the limitations of evaluation and management by telemedicine. The patient expressed understanding and agreed to proceed.  Vital Signs: Because this visit was a virtual/telehealth visit, some criteria may be missing or patient reported. Any vitals not documented were not able to be obtained and vitals that have been documented are patient reported.  VideoDeclined- This patient declined Librarian, academic. Therefore the visit was completed with audio only.  Persons Participating in Visit: Patient.  AWV Questionnaire: No: Patient Medicare AWV questionnaire was not completed prior to this visit.  Cardiac Risk Factors include: advanced age (>53men, >39 women);dyslipidemia;sedentary lifestyle     Objective:    Today's Vitals   01/22/24 0850  Weight: 139 lb (63 kg)  Height: 5' (1.524 m)   Body mass index is 27.15 kg/m.     01/22/2024    9:06 AM 11/21/2023   10:57 AM 11/17/2023   10:05 AM 01/21/2023    8:38 AM 01/21/2022    7:16 AM 11/24/2021    1:22 PM 09/26/2021   11:19 AM  Advanced Directives  Does Patient Have a Medical Advance Directive? Yes Yes Yes No Yes No Yes  Type of Estate agent of [place];Living will Healthcare Power of [place];Living will Healthcare Power of [place];Living will  Healthcare Power of [place];Living will  Healthcare Power of  [place];Living will  Does patient want to make changes to medical advance directive?  No - Patient declined No - Patient declined    Yes (MAU/Ambulatory/Procedural Areas - Information given)  Copy of Healthcare Power of Attorney in Chart? No - copy requested No - copy requested No - copy requested  Yes - validated most recent copy scanned in chart (See row information)    Would patient like information on creating a medical advance directive?    Yes (MAU/Ambulatory/Procedural Areas - Information given)       Current Medications (verified) Outpatient Encounter Medications as of 01/22/2024  Medication Sig   Cholecalciferol (VITAMIN D3) 25 MCG (1000 UT) CAPS Take 2,000 Units by mouth daily in the afternoon.   clobetasol  (OLUX ) 0.05 % topical foam Apply to aa's rash BID PRN. Avoid applying to face, groin, and axilla. Use as directed. Long-term use can cause thinning of the skin.   Cyanocobalamin  (VITAMIN B12) 1000 MCG TBCR Take 1,000 mcg by mouth every Monday, Wednesday, and Friday.   famotidine  (PEPCID ) 20 MG tablet Take 1 tablet (20 mg total) by mouth at bedtime.   halobetasol  (ULTRAVATE ) 0.05 % cream Apply to aa BID PRN. Avoid applying to face, groin, and axilla. Use as directed. Long-term use can cause thinning of the skin.   ibuprofen  (ADVIL ) 200 MG tablet Take 200 mg by mouth every 6 (six) hours as needed.   ketoconazole  (NIZORAL ) 2 % shampoo SHAMPOO INTO THE SCALP. LET SIT 5-10 MINUTES THEN WASH OUT. USE 3 DAYS PER WEEK   omeprazole  (PRILOSEC) 40 MG capsule Take 40 mg  by mouth daily.   polyethylene glycol powder (GLYCOLAX /MIRALAX ) 17 GM/SCOOP powder Take 17 g by mouth as needed for mild constipation or moderate constipation.   Ruxolitinib Phosphate  (OPZELURA ) 1.5 % CREA Apply to affected skin qd-bid   tacrolimus  (PROTOPIC ) 0.1 % ointment Apply topically 2 (two) times daily. To arms   valACYclovir  (VALTREX ) 1000 MG tablet TAKE TWO TABS BY MOUTH AT FIRST ONSET OF SYMPTOMS OF COLD SORES, THEN TAKE  TWO TABLETS BY MOUTH 12 HOURS LATER   levothyroxine  (SYNTHROID ) 25 MCG tablet Take 25 mcg by mouth daily before breakfast. (Patient not taking: Reported on 01/22/2024)   ondansetron  (ZOFRAN ) 4 MG tablet Take 1 tablet (4 mg total) by mouth every 8 (eight) hours as needed for vomiting or nausea. (Patient not taking: Reported on 01/22/2024)   oxyCODONE  (ROXICODONE ) 5 MG immediate release tablet Take 1 tablet (5 mg total) by mouth every 4 (four) hours as needed for severe pain (pain score 7-10) or moderate pain (pain score 4-6). (Patient not taking: Reported on 01/22/2024)   No facility-administered encounter medications on file as of 01/22/2024.    Allergies (verified) Codeine, Hydrocodone-acetaminophen , and Penicillins   History: Past Medical History:  Diagnosis Date   Allergy    Bradycardia    Collagen vascular disease (HCC)    Connective tissue disease (HCC)    COVID-19    DDD (degenerative disc disease), cervical    DDD (degenerative disc disease), lumbar    Endometrial polyp    not resolved with D&C (following)   GERD (gastroesophageal reflux disease)    gas/belching issues-was on protonix for short time-not since 6 months   HLD (hyperlipidemia)    Mixed connective tissue disease (HCC) diagnosed 5 years ago   no problems since diagnosis   Multiple thyroid  nodules 01/24/2014   Obesity    Osteoarthritis of cervical spine    Renal calculi    Spinal stenosis    Past Surgical History:  Procedure Laterality Date   APPENDECTOMY     CHOLECYSTECTOMY N/A 08/22/2021   Procedure: LAPAROSCOPIC CHOLECYSTECTOMY;  Surgeon: Vernetta Berg, MD;  Location: [facility];  Service: General;  Laterality: N/A;   COLONOSCOPY     DILATION AND CURETTAGE OF UTERUS     fibroids   DILATION AND CURETTAGE OF UTERUS  01/2005   endometrial polyp   ENDOMETRIAL BIOPSY  2008   fibroid   ENDOSCOPIC RETROGRADE CHOLANGIOPANCREATOGRAPHY (ERCP) WITH PROPOFOL  N/A 01/21/2022   Procedure: ENDOSCOPIC  RETROGRADE CHOLANGIOPANCREATOGRAPHY (ERCP) WITH PROPOFOL ;  Surgeon: Avram Lupita BRAVO, MD;  Location: [facility] ENDOSCOPY;  Service: Gastroenterology;  Laterality: N/A;   HAND SURGERY     carpal tunnel release bilateral Dr. Leonor   hysteroscopic resection      polyps of PMB   KNEE ARTHROSCOPY WITH MEDIAL MENISECTOMY Left 11/21/2023   Procedure: ARTHROSCOPY,LEFT KNEE, WITH MEDIAL MENISCECTOMY;  Surgeon: Sharl Selinda Dover, MD;  Location: [facility];  Service: Orthopedics;  Laterality: Left;   KNEE ARTHROSCOPY WITH SUBCHONDROPLASTY Left 11/21/2023   Procedure: ARTHROSCOPY, LEFT KNEE, WITH SUBCHONDROPLASTY;  Surgeon: Sharl Selinda Dover, MD;  Location: [facility];  Service: Orthopedics;  Laterality: Left;   REMOVAL OF STONES  01/21/2022   Procedure: REMOVAL OF STONES;  Surgeon: Avram Lupita BRAVO, MD;  Location: [facility] ENDOSCOPY;  Service: Gastroenterology;;   ANNETT  01/21/2022   Procedure: ANNETT;  Surgeon: Avram Lupita BRAVO, MD;  Location: [facility] ENDOSCOPY;  Service: Gastroenterology;;   TONSILLECTOMY     TUBAL LIGATION     1973  UPPER GASTROINTESTINAL ENDOSCOPY     Family History  Problem Relation Age of Onset   Dementia Mother    Coronary artery disease Father    Heart attack Father        deceased at 32   Diabetes Maternal Grandmother    Goiter Maternal Grandmother    Cancer Grandchild        rare type (dying at age 79)   Diabetes Cousin        multiple   Colon cancer Neg Hx    Esophageal cancer Neg Hx    Stomach cancer Neg Hx    Rectal cancer Neg Hx    Colon polyps Neg Hx    Social History   Socioeconomic History   Marital status: Married    Spouse name: Not on file   Number of children: 4   Years of education: HS   Highest education level: Not on file  Occupational History   Occupation: Retired   Tobacco Use   Smoking status: Former    Current packs/day: 0.00    Types: Cigarettes    Quit date: 07/22/1965    Years since quitting: 58.5    Smokeless tobacco: Never   Tobacco comments:    Smoked for 2 years as a teen  Advertising account planner   Vaping status: Never Used  Substance and Sexual Activity   Alcohol use: No   Drug use: No   Sexual activity: Yes    Birth control/protection: Surgical    Comment: BTL  Other Topics Concern   Not on file  Social History Narrative   + caffeine use     Social Drivers of Corporate investment banker Strain: Low Risk  (01/22/2024)   Overall Financial Resource Strain (CARDIA)    Difficulty of Paying Living Expenses: Not hard at all  Food Insecurity: No Food Insecurity (01/22/2024)   Hunger Vital Sign    Worried About Running Out of Food in the Last Year: Never true    Ran Out of Food in the Last Year: Never true  Transportation Needs: No Transportation Needs (01/22/2024)   PRAPARE - Administrator, Civil Service (Medical): No    Lack of Transportation (Non-Medical): No  Physical Activity: Inactive (01/22/2024)   Exercise Vital Sign    Days of Exercise per Week: 0 days    Minutes of Exercise per Session: 0 min  Stress: No Stress Concern Present (01/22/2024)   [employer] of Occupational Health - Occupational Stress Questionnaire    Feeling of Stress: Only a little  Social Connections: Moderately Integrated (01/22/2024)   Social Connection and Isolation Panel    Frequency of Communication with Friends and Family: More than three times a week    Frequency of Social Gatherings with Friends and Family: Twice a week    Attends Religious Services: 1 to 4 times per year    Active Member of [name] or Organizations: No    Attends Engineer, structural: Never    Marital Status: Married    Tobacco Counseling Counseling given: Not Answered Tobacco comments: Smoked for 2 years as a teen    Clinical Intake:  Pre-visit preparation completed: Yes  Pain : No/denies pain     BMI - recorded: 27.15 Nutritional Status: BMI 25 -29 Overweight Nutritional Risks: None Diabetes:  No  Lab Results  Component Value Date   HGBA1C 5.3 08/12/2016     How often do you need to have someone help you when you read  instructions, pamphlets, or other written materials from your doctor or pharmacy?: 1 - Never  Interpreter Needed?: No  Comments: lives with husband Information entered by :: B.Nafeesah Lapaglia,LPN   Activities of Daily Living     01/22/2024    9:07 AM 11/21/2023   11:06 AM  In your present state of health, do you have any difficulty performing the following activities:  Hearing? 0 0  Vision? 0 0  Difficulty concentrating or making decisions? 0 0  Walking or climbing stairs? 0   Dressing or bathing? 0   Doing errands, shopping? 0   Preparing Food and eating ? N   Using the Toilet? N   In the past six months, have you accidently leaked urine? N   Do you have problems with loss of bowel control? N   Managing your Medications? N   Managing your Finances? N   Housekeeping or managing your Housekeeping? N     Patient Care Team: Tower, Laine LABOR, MD as PCP - General Lonni Slain, MD as PCP - Cardiology (Cardiology) Barbarann Oneil BROCKS, MD (Inactive) as Consulting Physician (Orthopedic Surgery) Nada Macintosh, FNP as Nurse Practitioner (Family Medicine) Christi Vannie PARAS, MD as Attending Physician (Endocrinology) Dolphus Reiter, MD as Consulting Physician (Rheumatology) Broadus Bare, OD (Optometry) Kendall Hoy Jansky, MD as Referring Physician (Gastroenterology)  I have updated your Care Teams any recent Medical Services you may have received from other providers in the past year.     Assessment:   This is a routine wellness examination for [place].  Hearing/Vision screen Hearing Screening - Comments:: Pt says her hearing is good Vision Screening - Comments:: Pt says her vision is good :wears contacts Dr Leonce   Goals Addressed             This Visit's Progress    Remain active and independent   On track    01/22/24        Depression Screen     01/22/2024    9:04 AM 08/18/2023   12:38 PM 01/21/2023    8:37 AM 01/06/2023    3:03 PM 09/26/2021   11:24 AM 08/16/2020    3:55 PM 02/09/2019   11:45 AM  PHQ 2/9 Scores  PHQ - 2 Score 0 0 0 0 0 0 0    Fall Risk     01/22/2024    8:57 AM 08/18/2023   12:38 PM 01/21/2023    8:38 AM 01/06/2023    3:03 PM 09/26/2021   11:22 AM  Fall Risk   Falls in the past year? 0 0 0 0 1  Number falls in past yr: 0 0 0 0 0  Injury with Fall? 0 0 0 0 0  Comment     tripped down stairs  Risk for fall due to : No Fall Risks No Fall Risks No Fall Risks No Fall Risks No Fall Risks  Follow up Education provided;Falls prevention discussed Falls evaluation completed Falls prevention discussed;Education provided;Falls evaluation completed Falls evaluation completed Falls prevention discussed      Data saved with a previous flowsheet row definition    MEDICARE RISK AT HOME:  Medicare Risk at Home Any stairs in or around the home?: Yes (stairlifter) If so, are there any without handrails?: Yes Home free of loose throw rugs in walkways, pet beds, electrical cords, etc?: Yes Adequate lighting in your home to reduce risk of falls?: Yes Life alert?: No Use of a cane, walker or w/c?: No Grab bars in the bathroom?:  Yes Shower chair or bench in shower?: Yes (does not need) Elevated toilet seat or a handicapped toilet?: Yes  TIMED UP AND GO:  Was the test performed?  No  Cognitive Function: 6CIT completed    01/06/2018   11:21 AM 12/04/2016   10:40 AM 09/22/2015    3:00 PM  MMSE - Mini Mental State Exam  Orientation to time 5 5  5    Orientation to Place 5 5  5    Registration 3 3  3    Attention/ Calculation 0 0  5   Recall 3 3  3    Language- name 2 objects 0 0  0   Language- repeat 1 1 1   Language- follow 3 step command 3 3  3    Language- read & follow direction 0 0  1   Write a sentence 0 0  0   Copy design 0 0  0   Total score 20 20  26       Data saved with a previous flowsheet  row definition        01/22/2024    9:07 AM 01/21/2023    8:38 AM  6CIT Screen  What Year? 0 points 0 points  What month? 0 points 0 points  What time? 0 points 0 points  Count back from 20 0 points 0 points  Months in reverse 0 points 0 points  Repeat phrase 0 points 0 points  Total Score 0 points 0 points    Immunizations Immunization History  Administered Date(s) Administered   Fluad Quad(high Dose 65+) 04/03/2019, 04/23/2019, 07/04/2020   Influenza,inj,Quad PF,6+ Mos 04/13/2014, 09/22/2015, 08/12/2016, 08/06/2017   Pneumococcal Conjugate-13 04/13/2014   Pneumococcal Polysaccharide-23 09/15/2012   Td 11/07/1997, 01/28/2007   Tdap 05/28/2017   Zoster Recombinant(Shingrix) 02/13/2018, 07/20/2018   Zoster, Live 09/09/2006    Screening Tests Health Maintenance  Topic Date Due   COVID-19 Vaccine (1) Never done   Colonoscopy  04/12/2024   INFLUENZA VACCINE  02/20/2024   MAMMOGRAM  05/27/2024   Medicare Annual Wellness (AWV)  01/21/2025   DTaP/Tdap/Td (4 - Td or Tdap) 05/29/2027   Pneumococcal Vaccine: 50+ Years  Completed   DEXA SCAN  Completed   Hepatitis C Screening  Completed   Zoster Vaccines- Shingrix  Completed   Hepatitis B Vaccines  Aged Out   HPV VACCINES  Aged Out   Meningococcal B Vaccine  Aged Out    Health Maintenance  Health Maintenance Due  Topic Date Due   COVID-19 Vaccine (1) Never done   Colonoscopy  04/12/2024   Health Maintenance Items Addressed: None needed at this time  Additional Screening:  Vision Screening: Recommended annual ophthalmology exams for early detection of glaucoma and other disorders of the eye. Would you like a referral to an eye doctor? No    Dental Screening: Recommended annual dental exams for proper oral hygiene  Community Resource Referral / Chronic Care Management: CRR required this visit?  No   CCM required this visit?  No   Plan:    I have personally reviewed and noted the following in the patient's  chart:   Medical and social history Use of alcohol, tobacco or illicit drugs  Current medications and supplements including opioid prescriptions. Patient is not currently taking opioid prescriptions. Functional ability and status Nutritional status Physical activity Advanced directives List of other physicians Hospitalizations, surgeries, and ER visits in previous 12 months Vitals Screenings to include cognitive, depression, and falls Referrals and appointments  In addition, I have  reviewed and discussed with patient certain preventive protocols, quality metrics, and best practice recommendations. A written personalized care plan for preventive services as well as general preventive health recommendations were provided to patient.   Erminio LITTIE Saris, LPN   08/27/7972   After Visit Summary: (MyChart) Due to this being a telephonic visit, the after visit summary with patients personalized plan was offered to patient via MyChart   Notes: Nothing significant to report at this time.

## 2024-01-27 ENCOUNTER — Other Ambulatory Visit (INDEPENDENT_AMBULATORY_CARE_PROVIDER_SITE_OTHER)

## 2024-01-27 DIAGNOSIS — E559 Vitamin D deficiency, unspecified: Secondary | ICD-10-CM | POA: Diagnosis not present

## 2024-01-27 DIAGNOSIS — E7800 Pure hypercholesterolemia, unspecified: Secondary | ICD-10-CM

## 2024-01-27 DIAGNOSIS — Z79899 Other long term (current) drug therapy: Secondary | ICD-10-CM | POA: Diagnosis not present

## 2024-01-27 DIAGNOSIS — E039 Hypothyroidism, unspecified: Secondary | ICD-10-CM

## 2024-01-28 ENCOUNTER — Ambulatory Visit: Payer: Self-pay | Admitting: Family Medicine

## 2024-01-28 DIAGNOSIS — M5416 Radiculopathy, lumbar region: Secondary | ICD-10-CM | POA: Diagnosis not present

## 2024-01-28 LAB — COMPREHENSIVE METABOLIC PANEL WITH GFR
ALT: 10 U/L (ref 0–35)
AST: 18 U/L (ref 0–37)
Albumin: 4.3 g/dL (ref 3.5–5.2)
Alkaline Phosphatase: 121 U/L — ABNORMAL HIGH (ref 39–117)
BUN: 16 mg/dL (ref 6–23)
CO2: 31 meq/L (ref 19–32)
Calcium: 9 mg/dL (ref 8.4–10.5)
Chloride: 103 meq/L (ref 96–112)
Creatinine, Ser: 0.74 mg/dL (ref 0.40–1.20)
GFR: 78.01 mL/min (ref >60.00)
Glucose, Bld: 121 mg/dL — ABNORMAL HIGH (ref 70–99)
Potassium: 4.1 meq/L (ref 3.5–5.1)
Sodium: 141 meq/L (ref 135–145)
Total Bilirubin: 0.4 mg/dL (ref 0.2–1.2)
Total Protein: 6 g/dL (ref 6.0–8.3)

## 2024-01-28 LAB — CBC WITH DIFFERENTIAL/PLATELET
Basophils Absolute: 0 K/uL (ref 0.0–0.1)
Basophils Relative: 0.8 % (ref 0.0–3.0)
Eosinophils Absolute: 0.1 K/uL (ref 0.0–0.7)
Eosinophils Relative: 2.4 % (ref 0.0–5.0)
HCT: 36.4 % (ref 36.0–46.0)
Hemoglobin: 12.2 g/dL (ref 12.0–15.0)
Lymphocytes Relative: 25 % (ref 12.0–46.0)
Lymphs Abs: 1.2 K/uL (ref 0.7–4.0)
MCHC: 33.6 g/dL (ref 30.0–36.0)
MCV: 90.5 fl (ref 78.0–100.0)
Monocytes Absolute: 0.6 K/uL (ref 0.1–1.0)
Monocytes Relative: 12.2 % — ABNORMAL HIGH (ref 3.0–12.0)
Neutro Abs: 2.9 K/uL (ref 1.4–7.7)
Neutrophils Relative %: 59.6 % (ref 43.0–77.0)
Platelets: 222 K/uL (ref 150.0–400.0)
RBC: 4.02 Mil/uL (ref 3.87–5.11)
RDW: 14.4 % (ref 11.5–15.5)
WBC: 4.8 K/uL (ref 4.0–10.5)

## 2024-01-28 LAB — LIPID PANEL
Cholesterol: 194 mg/dL (ref 0–200)
HDL: 54.7 mg/dL (ref >39.00)
LDL Cholesterol: 115 mg/dL — ABNORMAL HIGH (ref 0–99)
NonHDL: 139.24
Total CHOL/HDL Ratio: 4
Triglycerides: 119 mg/dL (ref 0.0–149.0)
VLDL: 23.8 mg/dL (ref 0.0–40.0)

## 2024-01-28 LAB — TSH: TSH: 0.86 u[IU]/mL (ref 0.35–5.50)

## 2024-01-28 LAB — VITAMIN D 25 HYDROXY (VIT D DEFICIENCY, FRACTURES): VITD: 56.29 ng/mL (ref 30.00–100.00)

## 2024-01-28 LAB — VITAMIN B12: Vitamin B-12: 648 pg/mL (ref 211–911)

## 2024-02-05 ENCOUNTER — Encounter: Admitting: Primary Care

## 2024-02-18 ENCOUNTER — Encounter: Admitting: Family Medicine

## 2024-02-24 ENCOUNTER — Ambulatory Visit (INDEPENDENT_AMBULATORY_CARE_PROVIDER_SITE_OTHER): Admitting: Family Medicine

## 2024-02-24 ENCOUNTER — Encounter: Payer: Self-pay | Admitting: Family Medicine

## 2024-02-24 ENCOUNTER — Encounter: Admitting: Dermatology

## 2024-02-24 VITALS — BP 116/58 | HR 50 | Temp 98.0°F | Ht 63.75 in | Wt 141.1 lb

## 2024-02-24 DIAGNOSIS — R739 Hyperglycemia, unspecified: Secondary | ICD-10-CM | POA: Insufficient documentation

## 2024-02-24 DIAGNOSIS — E042 Nontoxic multinodular goiter: Secondary | ICD-10-CM

## 2024-02-24 DIAGNOSIS — E039 Hypothyroidism, unspecified: Secondary | ICD-10-CM | POA: Diagnosis not present

## 2024-02-24 DIAGNOSIS — Z1211 Encounter for screening for malignant neoplasm of colon: Secondary | ICD-10-CM

## 2024-02-24 DIAGNOSIS — Z79899 Other long term (current) drug therapy: Secondary | ICD-10-CM | POA: Diagnosis not present

## 2024-02-24 DIAGNOSIS — K219 Gastro-esophageal reflux disease without esophagitis: Secondary | ICD-10-CM

## 2024-02-24 DIAGNOSIS — E7800 Pure hypercholesterolemia, unspecified: Secondary | ICD-10-CM | POA: Diagnosis not present

## 2024-02-24 DIAGNOSIS — E559 Vitamin D deficiency, unspecified: Secondary | ICD-10-CM | POA: Diagnosis not present

## 2024-02-24 DIAGNOSIS — Z860100 Personal history of colon polyps, unspecified: Secondary | ICD-10-CM

## 2024-02-24 DIAGNOSIS — R350 Frequency of micturition: Secondary | ICD-10-CM | POA: Diagnosis not present

## 2024-02-24 DIAGNOSIS — Z0000 Encounter for general adult medical examination without abnormal findings: Secondary | ICD-10-CM | POA: Diagnosis not present

## 2024-02-24 NOTE — Assessment & Plan Note (Signed)
 Lab Results  Component Value Date   VITAMINB12 648 01/27/2024   Last vitamin D  Lab Results  Component Value Date   VD25OH 56.29 01/27/2024    Continue to monitor

## 2024-02-24 NOTE — Assessment & Plan Note (Signed)
 Due for yearly US   This was ordered  No clinical changes No exam changes  Lab Results  Component Value Date   TSH 0.86 01/27/2024

## 2024-02-24 NOTE — Assessment & Plan Note (Signed)
 Reviewed health habits including diet and exercise and skin cancer prevention Reviewed appropriate screening tests for age  Also reviewed health mt list, fam hx and immunization status , as well as social and family history   See HPI Labs reviewed and ordered Health Maintenance  Topic Date Due   Colon Cancer Screening  04/12/2024   Flu Shot  10/19/2024*   COVID-19 Vaccine (1) 03/11/2026*   Mammogram  05/27/2024   Medicare Annual Wellness Visit  01/21/2025   DTaP/Tdap/Td vaccine (4 - Td or Tdap) 05/29/2027   Pneumococcal Vaccine for age over 23  Completed   DEXA scan (bone density measurement)  Completed   Hepatitis C Screening  Completed   Zoster (Shingles) Vaccine  Completed   Hepatitis B Vaccine  Aged Out   HPV Vaccine  Aged Out   Meningitis B Vaccine  Aged Out  *Topic was postponed. The date shown is not the original due date.    Declines colonoscopy due to age  Mammogram is scheduled for nov 2025 Discussed fall prevention, supplements and exercise for bone density  No new falls but an incidental fracture noted during knee surgery  Utd derm care  Due for thyroid  us  - ordered  PHQ 0

## 2024-02-24 NOTE — Assessment & Plan Note (Signed)
 Pt is over 76  Would rather not do colonoscopy  She will d/[facility] new [facility]

## 2024-02-24 NOTE — Assessment & Plan Note (Signed)
 121 non fasting  Will check A1c at next follow up in 2 months  Weight is good

## 2024-02-24 NOTE — Assessment & Plan Note (Signed)
 Worsening with age  Discussed possible OAB/ pelvic relaxation  Trial of estrace  vaginal cream twice weekly  Follow up 2 mo for re check and exam

## 2024-02-24 NOTE — Assessment & Plan Note (Signed)
 Pt was due for colonoscopy in sept / decided against due to age

## 2024-02-24 NOTE — Assessment & Plan Note (Signed)
 Vitamin D  level is therapeutic with current supplementation Disc importance of this to bone and overall health  Last vitamin D  Lab Results  Component Value Date   VD25OH 56.29 01/27/2024

## 2024-02-24 NOTE — Assessment & Plan Note (Signed)
 Disc goals for lipids and reasons to control them Rev last labs with pt Rev low sat fat diet in detail Overall stable Intol of statins

## 2024-02-24 NOTE — Assessment & Plan Note (Signed)
 Hypothyroidism  Pt has no clinical changes No change in energy level/ hair or skin/ edema and no tremor Lab Results  Component Value Date   TSH 0.86 01/27/2024    No longer on thyroid  supplementation

## 2024-02-24 NOTE — Patient Instructions (Addendum)
 Stay active  Add some strength training to your routine, this is important for bone and brain health and can reduce your risk of falls and help your body use insulin properly and regulate weight  Light weights, exercise bands , and internet videos are a good way to start  Yoga (chair or regular), machines , floor exercises or a gym with machines are also good options   I put the referral in for annual thyroid  ultrasound to follow the nodule  Please let us  know if you don't hear in 1-2 weeks to set that up (mychart message or call or letter)    Make sure you get protein with every meal The following are examples of protein in diet  Meat (lean)  Fish  Eggs  Dairy products  Soy products  Oat milk  Almond milk Legumes  Nuts and nut butters  Dried beans    Try estrace  vaginal cream - a pea sized amount vaginally twice a week /use a little around the urethra area as well I want to see if this helps support your urinary system   Follow up in 2 months for this / we will also do a blood sugar re check called A1c

## 2024-02-24 NOTE — Progress Notes (Signed)
 Subjective:    Patient ID: Morgan [name], female    DOB: 03/24/1946, 78 y.o.   MRN: [id number]  HPI  Here for health maintenance exam and to review chronic medical problems   Wt Readings from Last 3 Encounters:  02/24/24 141 lb 2 oz (64 kg)  01/22/24 139 lb (63 kg)  11/21/23 139 lb 15.9 oz (63.5 kg)   24.41 kg/m  Vitals:   02/24/24 1527  BP: (!) 116/58  Pulse: (!) 50  Temp: 98 F (36.7 C)  SpO2: 99%    Immunization History  Administered Date(s) Administered   Fluad Quad(high Dose 65+) 04/03/2019, 04/23/2019, 07/04/2020   Influenza,inj,Quad PF,6+ Mos 04/13/2014, 09/22/2015, 08/12/2016, 08/06/2017   Pneumococcal Conjugate-13 04/13/2014   Pneumococcal Polysaccharide-23 09/15/2012   Td 11/07/1997, 01/28/2007   Tdap 05/28/2017   Zoster Recombinant(Shingrix) 02/13/2018, 07/20/2018   Zoster, Live 09/09/2006    Health Maintenance Due  Topic Date Due   Colonoscopy  04/12/2024   Doing well overall  Thinks she is in good shape    Mammogram 05/2023 -already has it scheduled  Self breast exam- no lumps   Gyn health No problems    Colon cancer screening - colonoscopy 03/2019 with 5 y recall  Santina for 2nd opinion GI at [facility]- dx with IBS  Is not interested in another colonoscopy  Has chronic gas problems  On omeprazole  now (20 does not work, she has to be on 40)    Bone health  Dexa 05/2023 -normal bmd  Falls- none (last fall 5 y ago)  Fractures-found tibial plateau fracture incidentally with knee surgery  Supplements  Last vitamin D  Lab Results  Component Value Date   VD25OH 56.29 01/27/2024    Exercise : Did some PT after her surgery  Takes care of her husband   Had knee surgery/ meniscus    Derm care  Has appointment this week Goes regularly every 6 months    Mood    01/22/2024    9:04 AM 08/18/2023   12:38 PM 01/21/2023    8:37 AM 01/06/2023    3:03 PM 09/26/2021   11:24 AM  Depression screen PHQ 2/9  Decreased Interest 0 0 0 0 0  Down,  Depressed, Hopeless 0 0 0 0 0  PHQ - 2 Score 0 0 0 0 0      History of thyroid  nodules  No clinical changes Lab Results  Component Value Date   TSH 0.86 01/27/2024     Hyperlipidemia  Lab Results  Component Value Date   CHOL 194 01/27/2024   CHOL 197 10/15/2022   CHOL 195 09/27/2021   Lab Results  Component Value Date   HDL 54.70 01/27/2024   HDL 62.30 10/15/2022   HDL 64.00 09/27/2021   Lab Results  Component Value Date   LDLCALC 115 (H) 01/27/2024   LDLCALC 114 (H) 10/15/2022   LDLCALC 113 (H) 09/27/2021   Lab Results  Component Value Date   TRIG 119.0 01/27/2024   TRIG 104.0 10/15/2022   TRIG 86.0 09/27/2021   Lab Results  Component Value Date   CHOLHDL 4 01/27/2024   CHOLHDL 3 10/15/2022   CHOLHDL 3 09/27/2021   Lab Results  Component Value Date   LDLDIRECT 148.8 10/09/2010   LDLDIRECT 166.4 08/09/2010   LDLDIRECT 144.5 01/29/2008   Statin intol in past    Lab Results  Component Value Date   NA 141 01/27/2024   K 4.1 01/27/2024   CO2 31 01/27/2024   GLUCOSE  121 (H) 01/27/2024   BUN 16 01/27/2024   CREATININE 0.74 01/27/2024   CALCIUM 9.0 01/27/2024   GFR 78.01 01/27/2024   GFRNONAA >60 11/24/2021   Lab Results  Component Value Date   ALT 10 01/27/2024   AST 18 01/27/2024   ALKPHOS 121 (H) 01/27/2024   BILITOT 0.4 01/27/2024    Lab Results  Component Value Date   WBC 4.8 01/27/2024   HGB 12.2 01/27/2024   HCT 36.4 01/27/2024   MCV 90.5 01/27/2024   PLT 222.0 01/27/2024   Lab Results  Component Value Date   VITAMINB12 648 01/27/2024     Patient Active Problem List   Diagnosis Date Noted   Elevated random blood glucose level 02/24/2024   Urinary frequency 02/24/2024   Popliteal cyst, left 08/19/2023   Posterior knee pain, left 08/18/2023   Osteoarthritis of both knees 08/18/2023   Estrogen deficiency 10/22/2022   Pre-syncope 11/30/2021   Insomnia 11/30/2021   Bradycardia 11/30/2021   Fasting hypoglycemia 10/03/2021    S/P laparoscopic cholecystectomy 08/22/2021   Current use of proton pump inhibitor 07/04/2020   Idiopathic small fiber sensory neuropathy 04/12/2020   Belching 02/09/2020   Vitamin D  deficiency 10/05/2019   Colon cancer screening 02/09/2019   Paresthesia of foot, bilateral 08/12/2016   Encounter for Medicare annual wellness exam 04/13/2014   Allergic rhinitis 04/13/2014   Routine general medical examination at a health care facility 04/07/2014   Multiple thyroid  nodules 01/24/2014   History of small bowel obstruction 01/20/2014   DDD (degenerative disc disease), cervical    DDD (degenerative disc disease), lumbar    Spinal stenosis    TINNITUS 06/08/2010   Hypothyroidism 04/18/2010   GERD 05/03/2008   DIVERTICULOSIS OF COLON 04/26/2008   HEPATITIS, HX OF 04/26/2008   History of colonic polyps 04/26/2008   HEMORRHOIDS, INTERNAL 01/11/2008   DERMATITIS, SEBORRHEIC NEC 01/28/2007   Hyperlipidemia 01/19/2007   IBS 01/19/2007   OVERACTIVE BLADDER 01/19/2007   POSTMENOPAUSAL STATUS 01/19/2007   ECZEMA 01/19/2007   Osteoarthritis 01/19/2007   Past Medical History:  Diagnosis Date   Allergy    Bradycardia    Collagen vascular disease (HCC)    Connective tissue disease (HCC)    COVID-19    DDD (degenerative disc disease), cervical    DDD (degenerative disc disease), lumbar    Endometrial polyp    not resolved with D&C (following)   GERD (gastroesophageal reflux disease)    gas/belching issues-was on protonix for short time-not since 6 months   HLD (hyperlipidemia)    Mixed connective tissue disease (HCC) diagnosed 5 years ago   no problems since diagnosis   Multiple thyroid  nodules 01/24/2014   Obesity    Osteoarthritis of cervical spine    Renal calculi    Spinal stenosis    Past Surgical History:  Procedure Laterality Date   APPENDECTOMY     CHOLECYSTECTOMY N/A 08/22/2021   Procedure: LAPAROSCOPIC CHOLECYSTECTOMY;  Surgeon: Vernetta Berg, MD;  Location:   SURGERY CENTER;  Service: General;  Laterality: N/A;   COLONOSCOPY     DILATION AND CURETTAGE OF UTERUS     fibroids   DILATION AND CURETTAGE OF UTERUS  01/2005   endometrial polyp   ENDOMETRIAL BIOPSY  2008   fibroid   ENDOSCOPIC RETROGRADE CHOLANGIOPANCREATOGRAPHY (ERCP) WITH PROPOFOL  N/A 01/21/2022   Procedure: ENDOSCOPIC RETROGRADE CHOLANGIOPANCREATOGRAPHY (ERCP) WITH PROPOFOL ;  Surgeon: Avram Lupita BRAVO, MD;  Location: [facility] ENDOSCOPY;  Service: Gastroenterology;  Laterality: N/A;   HAND SURGERY  carpal tunnel release bilateral Dr. Leonor   hysteroscopic resection      polyps of PMB   KNEE ARTHROSCOPY WITH MEDIAL MENISECTOMY Left 11/21/2023   Procedure: ARTHROSCOPY,LEFT KNEE, WITH MEDIAL MENISCECTOMY;  Surgeon: Sharl Selinda Dover, MD;  Location: [facility];  Service: Orthopedics;  Laterality: Left;   KNEE ARTHROSCOPY WITH SUBCHONDROPLASTY Left 11/21/2023   Procedure: ARTHROSCOPY, LEFT KNEE, WITH SUBCHONDROPLASTY;  Surgeon: Sharl Selinda Dover, MD;  Location: [facility];  Service: Orthopedics;  Laterality: Left;   REMOVAL OF STONES  01/21/2022   Procedure: REMOVAL OF STONES;  Surgeon: Avram Lupita BRAVO, MD;  Location: [facility] ENDOSCOPY;  Service: Gastroenterology;;   ANNETT  01/21/2022   Procedure: SPHINCTEROTOMY;  Surgeon: Avram Lupita BRAVO, MD;  Location: [facility] ENDOSCOPY;  Service: Gastroenterology;;   TONSILLECTOMY     TUBAL LIGATION     1973   UPPER GASTROINTESTINAL ENDOSCOPY     Social History   Tobacco Use   Smoking status: Former    Current packs/day: 0.00    Types: Cigarettes    Quit date: 07/22/1965    Years since quitting: 58.6   Smokeless tobacco: Never   Tobacco comments:    Smoked for 2 years as a teen  Advertising account planner   Vaping status: Never Used  Substance Use Topics   Alcohol use: No   Drug use: No   Family History  Problem Relation Age of Onset   Dementia Mother    Coronary artery disease Father    Heart attack Father        deceased at  18   Diabetes Maternal Grandmother    Goiter Maternal Grandmother    Cancer Grandchild        rare type (dying at age 10)   Diabetes Cousin        multiple   Colon cancer Neg Hx    Esophageal cancer Neg Hx    Stomach cancer Neg Hx    Rectal cancer Neg Hx    Colon polyps Neg Hx    Allergies  Allergen Reactions   Codeine Nausea And Vomiting   Hydrocodone-Acetaminophen      REACTION: reaction not known   Penicillins Hives   Current Outpatient Medications on File Prior to Visit  Medication Sig Dispense Refill   Cholecalciferol (VITAMIN D3) 25 MCG (1000 UT) CAPS Take 2,000 Units by mouth daily in the afternoon.     clobetasol  (OLUX ) 0.05 % topical foam Apply to aa's rash BID PRN. Avoid applying to face, groin, and axilla. Use as directed. Long-term use can cause thinning of the skin. 60 g 5   Cyanocobalamin  (VITAMIN B12) 1000 MCG TBCR Take 1,000 mcg by mouth every Monday, Wednesday, and Friday.     famotidine  (PEPCID ) 20 MG tablet Take 1 tablet (20 mg total) by mouth at bedtime. 30 tablet 2   halobetasol  (ULTRAVATE ) 0.05 % cream Apply to aa BID PRN. Avoid applying to face, groin, and axilla. Use as directed. Long-term use can cause thinning of the skin. 50 g 0   ibuprofen  (ADVIL ) 200 MG tablet Take 200 mg by mouth every 6 (six) hours as needed.     ketoconazole  (NIZORAL ) 2 % shampoo SHAMPOO INTO THE SCALP. LET SIT 5-10 MINUTES THEN WASH OUT. USE 3 DAYS PER WEEK 120 mL 3   omeprazole  (PRILOSEC) 40 MG capsule Take 40 mg by mouth daily.     polyethylene glycol powder (GLYCOLAX /MIRALAX ) 17 GM/SCOOP powder Take 17 g by mouth as needed for mild constipation  or moderate constipation.     Ruxolitinib Phosphate  (OPZELURA ) 1.5 % CREA Apply to affected skin qd-bid 60 g 2   tacrolimus  (PROTOPIC ) 0.1 % ointment Apply topically 2 (two) times daily. To arms 60 g 1   valACYclovir  (VALTREX ) 1000 MG tablet TAKE TWO TABS BY MOUTH AT FIRST ONSET OF SYMPTOMS OF COLD SORES, THEN TAKE TWO TABLETS BY MOUTH 12  HOURS LATER 30 tablet 10   No current facility-administered medications on file prior to visit.    Review of Systems  Constitutional:  Negative for activity change, appetite change, fatigue, fever and unexpected weight change.  HENT:  Negative for congestion, ear pain, rhinorrhea, sinus pressure and sore throat.   Eyes:  Negative for pain, redness and visual disturbance.  Respiratory:  Negative for cough, shortness of breath and wheezing.   Cardiovascular:  Negative for chest pain and palpitations.  Gastrointestinal:  Negative for abdominal pain, blood in stool, constipation and diarrhea.       Belching and flatus   Endocrine: Negative for polydipsia and polyuria.  Genitourinary:  Negative for dysuria, frequency and urgency.  Musculoskeletal:  Negative for arthralgias, back pain and myalgias.  Skin:  Negative for pallor and rash.  Allergic/Immunologic: Negative for environmental allergies.  Neurological:  Negative for dizziness, syncope and headaches.  Hematological:  Negative for adenopathy. Does not bruise/bleed easily.  Psychiatric/Behavioral:  Negative for decreased concentration and dysphoric mood. The patient is not nervous/anxious.        Caregiver stress Cares for husband       Objective:   Physical Exam Constitutional:      General: She is not in acute distress.    Appearance: Normal appearance. She is well-developed and normal weight. She is not ill-appearing or diaphoretic.  HENT:     Head: Normocephalic and atraumatic.     Right Ear: Tympanic membrane, ear canal and external ear normal.     Left Ear: Tympanic membrane, ear canal and external ear normal.     Nose: Nose normal. No congestion.     Mouth/Throat:     Mouth: Mucous membranes are moist.     Pharynx: Oropharynx is clear. No posterior oropharyngeal erythema.  Eyes:     General: No scleral icterus.    Extraocular Movements: Extraocular movements intact.     Conjunctiva/sclera: Conjunctivae normal.      Pupils: Pupils are equal, round, and reactive to light.  Neck:     Thyroid : No thyromegaly.     Vascular: No carotid bruit or JVD.  Cardiovascular:     Rate and Rhythm: Normal rate and regular rhythm.     Pulses: Normal pulses.     Heart sounds: Normal heart sounds.     No gallop.  Pulmonary:     Effort: Pulmonary effort is normal. No respiratory distress.     Breath sounds: Normal breath sounds. No wheezing.     Comments: Good air exch Chest:     Chest wall: No tenderness.  Abdominal:     General: Bowel sounds are normal. There is no distension or abdominal bruit.     Palpations: Abdomen is soft. There is no mass.     Tenderness: There is no abdominal tenderness.     Hernia: No hernia is present.  Genitourinary:    Comments: Breast exam: No mass, nodules, thickening, tenderness, bulging, retraction, inflamation, nipple discharge or skin changes noted.  No axillary or clavicular LA.     Musculoskeletal:        General:  No tenderness. Normal range of motion.     Cervical back: Normal range of motion and neck supple. No rigidity. No muscular tenderness.     Right lower leg: No edema.     Left lower leg: No edema.     Comments: No kyphosis   Lymphadenopathy:     Cervical: No cervical adenopathy.  Skin:    General: Skin is warm and dry.     Coloration: Skin is not pale.     Findings: No erythema or rash.     Comments: Solar lentigines diffusely Some sks   Neurological:     Mental Status: She is alert. Mental status is at baseline.     Cranial Nerves: No cranial nerve deficit.     Motor: No abnormal muscle tone.     Coordination: Coordination normal.     Gait: Gait normal.     Deep Tendon Reflexes: Reflexes are normal and symmetric. Reflexes normal.  Psychiatric:        Mood and Affect: Mood normal.        Cognition and Memory: Cognition and memory normal.           Assessment & Plan:   Problem List Items Addressed This Visit       Digestive   GERD   Now on ppi  from new GI provider at [facility]  Some improvement in gas  Omeprazole  40 mg daily         Endocrine   Multiple thyroid  nodules   Due for yearly US   This was ordered  No clinical changes No exam changes  Lab Results  Component Value Date   TSH 0.86 01/27/2024         Hypothyroidism   Hypothyroidism  Pt has no clinical changes No change in energy level/ hair or skin/ edema and no tremor Lab Results  Component Value Date   TSH 0.86 01/27/2024    No longer on thyroid  supplementation         Other   Vitamin D  deficiency   Vitamin D  level is therapeutic with current supplementation Disc importance of this to bone and overall health  Last vitamin D  Lab Results  Component Value Date   VD25OH 56.29 01/27/2024         Urinary frequency   Worsening with age  Discussed possible OAB/ pelvic relaxation  Trial of estrace  vaginal cream twice weekly  Follow up 2 mo for re check and exam       Routine general medical examination at a health care facility - Primary   Reviewed health habits including diet and exercise and skin cancer prevention Reviewed appropriate screening tests for age  Also reviewed health mt list, fam hx and immunization status , as well as social and family history   See HPI Labs reviewed and ordered Health Maintenance  Topic Date Due   Colon Cancer Screening  04/12/2024   Flu Shot  10/19/2024*   COVID-19 Vaccine (1) 03/11/2026*   Mammogram  05/27/2024   Medicare Annual Wellness Visit  01/21/2025   DTaP/Tdap/Td vaccine (4 - Td or Tdap) 05/29/2027   Pneumococcal Vaccine for age over 62  Completed   DEXA scan (bone density measurement)  Completed   Hepatitis C Screening  Completed   Zoster (Shingles) Vaccine  Completed   Hepatitis B Vaccine  Aged Out   HPV Vaccine  Aged Out   Meningitis B Vaccine  Aged Out  *Topic was postponed. The date shown is not the  original due date.    Declines colonoscopy due to age  Mammogram is scheduled for nov  2025 Discussed fall prevention, supplements and exercise for bone density  No new falls but an incidental fracture noted during knee surgery  Utd derm care  Due for thyroid  us  - ordered  PHQ 0       Hyperlipidemia   Disc goals for lipids and reasons to control them Rev last labs with pt Rev low sat fat diet in detail Overall stable Intol of statins       History of colonic polyps   Pt was due for colonoscopy in sept / decided against due to age       Elevated random blood glucose level   121 non fasting  Will check A1c at next follow up in 2 months  Weight is good       Current use of proton pump inhibitor   Lab Results  Component Value Date   VITAMINB12 648 01/27/2024   Last vitamin D  Lab Results  Component Value Date   VD25OH 56.29 01/27/2024    Continue to monitor       Colon cancer screening   Pt is over 76  Would rather not do colonoscopy  She will d/w her new GI provider [facility]

## 2024-02-24 NOTE — Assessment & Plan Note (Signed)
 Now on ppi from new GI provider at [facility]  Some improvement in gas  Omeprazole  40 mg daily

## 2024-02-26 ENCOUNTER — Ambulatory Visit: Admitting: Dermatology

## 2024-02-26 DIAGNOSIS — Z1283 Encounter for screening for malignant neoplasm of skin: Secondary | ICD-10-CM

## 2024-02-26 DIAGNOSIS — L578 Other skin changes due to chronic exposure to nonionizing radiation: Secondary | ICD-10-CM

## 2024-02-26 DIAGNOSIS — W908XXA Exposure to other nonionizing radiation, initial encounter: Secondary | ICD-10-CM | POA: Diagnosis not present

## 2024-02-26 DIAGNOSIS — D692 Other nonthrombocytopenic purpura: Secondary | ICD-10-CM

## 2024-02-26 DIAGNOSIS — L409 Psoriasis, unspecified: Secondary | ICD-10-CM

## 2024-02-26 DIAGNOSIS — D485 Neoplasm of uncertain behavior of skin: Secondary | ICD-10-CM | POA: Diagnosis not present

## 2024-02-26 DIAGNOSIS — L820 Inflamed seborrheic keratosis: Secondary | ICD-10-CM | POA: Diagnosis not present

## 2024-02-26 DIAGNOSIS — D1801 Hemangioma of skin and subcutaneous tissue: Secondary | ICD-10-CM | POA: Diagnosis not present

## 2024-02-26 DIAGNOSIS — D229 Melanocytic nevi, unspecified: Secondary | ICD-10-CM

## 2024-02-26 DIAGNOSIS — L570 Actinic keratosis: Secondary | ICD-10-CM

## 2024-02-26 DIAGNOSIS — D0439 Carcinoma in situ of skin of other parts of face: Secondary | ICD-10-CM

## 2024-02-26 DIAGNOSIS — D099 Carcinoma in situ, unspecified: Secondary | ICD-10-CM

## 2024-02-26 HISTORY — DX: Carcinoma in situ, unspecified: D09.9

## 2024-02-26 MED ORDER — TRIAMCINOLONE ACETONIDE 0.1 % EX OINT: TOPICAL_OINTMENT | CUTANEOUS | 2 refills | Status: AC

## 2024-02-26 NOTE — Patient Instructions (Addendum)
 Recommend OTC Gold Bond Rapid Relief Anti-Itch cream (pramoxine + menthol ), CeraVe Anti-itch cream or lotion (pramoxine), Sarna lotion (Original- menthol  + camphor or Sensitive- pramoxine) or Eucerin 12 hour Itch Relief lotion (menthol ) up to 3 times per day to areas on body that are itchy.   Start Zoryve Cream - apply once a day as needed to affected area ears.   Wound Care Instructions  Cleanse wound gently with soap and water once a day then pat dry with clean gauze. Apply a thin coat of Petrolatum (petroleum jelly, Vaseline) over the wound (unless you have an allergy to this). We recommend that you use a new, sterile tube of Vaseline. Do not pick or remove scabs. Do not remove the yellow or white healing tissue from the base of the wound.  Cover the wound with fresh, clean, nonstick gauze and secure with paper tape. You may use Band-Aids in place of gauze and tape if the wound is small enough, but would recommend trimming much of the tape off as there is often too much. Sometimes Band-Aids can irritate the skin.  You should call the office for your biopsy report after 1 week if you have not already been contacted.  If you experience any problems, such as abnormal amounts of bleeding, swelling, significant bruising, significant pain, or evidence of infection, please call the office immediately.  FOR ADULT SURGERY PATIENTS: If you need something for pain relief you may take 1 extra strength Tylenol  (acetaminophen ) AND 2 Ibuprofen  (200mg  each) together every 4 hours as needed for pain. (do not take these if you are allergic to them or if you have a reason you should not take them.) Typically, you may only need pain medication for 1 to 3 days.     - Start 5-fluorouracil /calcipotriene cream twice a day for 3-4 days to affected areas including left nasal tip.Patient provided with handout reviewing treatment course and side effects and advised to call or message us  on MyChart with any  concerns.  Reviewed course of treatment and expected reaction.  Patient advised to expect inflammation and crusting and advised that erosions are possible.  Patient advised to be diligent with sun protection during and after treatment. Counseled to keep medication out of reach of children and pets.  5-Fluorouracil /Calcipotriene Patient Education   Actinic keratoses are the dry, red scaly spots on the skin caused by sun damage. A portion of these spots can turn into skin cancer with time, and treating them can help prevent development of skin cancer.   Treatment of these spots requires removal of the defective skin cells. There are various ways to remove actinic keratoses, including freezing with liquid nitrogen, treatment with creams, or treatment with a blue light procedure in the office.   5-fluorouracil  cream is a topical cream used to treat actinic keratoses. It works by interfering with the growth of abnormal fast-growing skin cells, such as actinic keratoses. These cells peel off and are replaced by healthy ones.   5-fluorouracil /calcipotriene is a combination of the 5-fluorouracil  cream with a vitamin D  analog cream called calcipotriene. The calcipotriene alone does not treat actinic keratoses. However, when it is combined with 5-fluorouracil , it helps the 5-fluorouracil  treat the actinic keratoses much faster so that the same results can be achieved with a much shorter treatment time.  INSTRUCTIONS FOR 5-FLUOROURACIL /CALCIPOTRIENE CREAM:   5-fluorouracil /calcipotriene cream typically only needs to be used for 4-7 days. A thin layer should be applied twice a day to the treatment areas recommended by your  physician.   If your physician prescribed you separate tubes of 5-fluourouracil and calcipotriene, apply a thin layer of 5-fluorouracil  followed by a thin layer of calcipotriene.   Avoid contact with your eyes, nostrils, and mouth. Do not use 5-fluorouracil /calcipotriene cream on infected  or open wounds.   You will develop redness, irritation and some crusting at areas where you have pre-cancer damage/actinic keratoses. IF YOU DEVELOP PAIN, BLEEDING, OR SIGNIFICANT CRUSTING, STOP THE TREATMENT EARLY - you have already gotten a good response and the actinic keratoses should clear up well.  Wash your hands after applying 5-fluorouracil  5% cream on your skin.   A moisturizer or sunscreen with a minimum SPF 30 should be applied each morning.   Once you have finished the treatment, you can apply a thin layer of Vaseline twice a day to irritated areas to soothe and calm the areas more quickly. If you experience significant discomfort, contact your physician.  For some patients it is necessary to repeat the treatment for best results.  SIDE EFFECTS: When using 5-fluorouracil /calcipotriene cream, you may have mild irritation, such as redness, dryness, swelling, or a mild burning sensation. This usually resolves within 2 weeks. The more actinic keratoses you have, the more redness and inflammation you can expect during treatment. Eye irritation has been reported rarely. If this occurs, please let us  know.  If you have any trouble using this cream, please call the office. If you have any other questions about this information, please do not hesitate to ask me before you leave the office.  Due to recent changes in healthcare laws, you may see results of your pathology and/or laboratory studies on MyChart before the doctors have had a chance to review them. We understand that in some cases there may be results that are confusing or concerning to you. Please understand that not all results are received at the same time and often the doctors may need to interpret multiple results in order to provide you with the best plan of care or course of treatment. Therefore, we ask that you please give us  2 business days to thoroughly review all your results before contacting the office for clarification.  Should we see a critical lab result, you will be contacted sooner.   If You Need Anything After Your Visit  If you have any questions or concerns for your doctor, please call our main line at [phone] and press option 4 to reach your doctor's medical assistant. If no one answers, please leave a voicemail as directed and we will return your call as soon as possible. Messages left after 4 pm will be answered the following business day.   You may also send us  a message via MyChart. We typically respond to MyChart messages within 1-2 business days.  For prescription refills, please ask your pharmacy to contact our office. Our fax number is [phone].  If you have an urgent issue when the clinic is closed that cannot wait until the next business day, you can page your doctor at the number below.    Please note that while we do our best to be available for urgent issues outside of office hours, we are not available 24/7.   If you have an urgent issue and are unable to reach us , you may choose to seek medical care at your doctor's office, retail clinic, urgent care center, or emergency room.  If you have a medical emergency, please immediately call 911 or go to the emergency department.  Pager Numbers  -  Dr. Hester: [phone]  - Dr. Jackquline: [phone]  - Dr. Claudene: [phone]   In the event of inclement weather, please call our main line at [phone] for an update on the status of any delays or closures.  Dermatology Medication Tips: Please keep the boxes that topical medications come in in order to help keep track of the instructions about where and how to use these. Pharmacies typically print the medication instructions only on the boxes and not directly on the medication tubes.   If your medication is too expensive, please contact our office at [phone] option 4 or send us  a message through MyChart.   We are unable to tell what your co-pay for medications will be  in advance as this is different depending on your insurance coverage. However, we may be able to find a substitute medication at lower cost or fill out paperwork to get insurance to cover a needed medication.   If a prior authorization is required to get your medication covered by your insurance company, please allow us  1-2 business days to complete this process.  Drug prices often vary depending on where the prescription is filled and some pharmacies may offer cheaper prices.  The website www.goodrx.com contains coupons for medications through different pharmacies. The prices here do not account for what the cost may be with help from insurance (it may be cheaper with your insurance), but the website can give you the price if you did not use any insurance.  - You can print the associated coupon and take it with your prescription to the pharmacy.  - You may also stop by our office during regular business hours and pick up a GoodRx coupon card.  - If you need your prescription sent electronically to a different pharmacy, notify our office through [facility] or by phone at [phone] option 4.     Si Usted Necesita Algo Despus de Su Visita  Tambin puede enviarnos un mensaje a travs de Clinical cytogeneticist. Por lo general respondemos a los mensajes de MyChart en el transcurso de 1 a 2 das hbiles.  Para renovar recetas, por favor pida a su farmacia que se ponga en contacto con nuestra oficina. Randi lakes de fax es [place] [phone].  Si tiene un asunto urgente cuando la clnica est cerrada y que no puede esperar hasta el siguiente da hbil, puede llamar/localizar a su doctor(a) al nmero que aparece a continuacin.   Por favor, tenga en cuenta que aunque hacemos todo lo posible para estar disponibles para asuntos urgentes fuera del horario de [place], no estamos disponibles las 24 horas del da, los 7 [address]  [address] de la [place].   Si tiene un problema urgente y no puede comunicarse con nosotros,  puede optar por buscar atencin mdica  en el consultorio de su doctor(a), en una clnica privada, en un centro de atencin urgente o en una sala de emergencias.  Si tiene Engineer, drilling, por favor llame inmediatamente al 911 o vaya a la sala de emergencias.  Nmeros de bper  - Dr. Hester: [phone]  - Dra. Jackquline: [phone]  - Dr. Claudene: [phone]   En caso de inclemencias del tiempo, por favor llame a landry capes principal al [phone] para una actualizacin sobre el Enterprise de cualquier retraso o cierre.  Consejos para la medicacin en dermatologa: Por favor, guarde las cajas en las que vienen los medicamentos de uso tpico para ayudarle a seguir las instrucciones sobre dnde y cmo usarlos. Las farmacias generalmente imprimen las instrucciones  del medicamento slo en las cajas y no directamente en los tubos del medicamento.   Si su medicamento es muy caro, por favor, pngase en contacto con landry rieger llamando al [phone] y presione la opcin 4 o envenos un mensaje a travs de Clinical cytogeneticist.   No podemos decirle cul ser su copago por los medicamentos por adelantado ya que esto es diferente dependiendo de la cobertura de su seguro. Sin embargo, es posible que podamos encontrar un medicamento sustituto a Audiological scientist un formulario para que el seguro cubra el medicamento que se considera necesario.   Si se requiere una autorizacin previa para que su compaa de seguros malta su medicamento, por favor permtanos de 1 a 2 das hbiles para completar este proceso.  Los precios de los medicamentos varan con frecuencia dependiendo del Environmental consultant de dnde se surte la receta y alguna farmacias pueden ofrecer precios ms baratos.  El sitio web www.goodrx.com tiene cupones para medicamentos de Health and safety inspector. Los precios aqu no tienen en cuenta lo que podra costar con la ayuda del seguro (puede ser ms barato con su seguro), pero el sitio web puede darle  el precio si no utiliz Tourist information centre manager.  - Puede imprimir el cupn correspondiente y llevarlo con su receta a la farmacia.  - Tambin puede pasar por nuestra oficina durante el horario de atencin regular y Education officer, museum una tarjeta de cupones de GoodRx.  - Si necesita que su receta se enve electrnicamente a una farmacia diferente, informe a nuestra oficina a travs de MyChart de [place] o por telfono llamando al [phone] y presione la opcin 4.

## 2024-02-26 NOTE — Progress Notes (Signed)
 Follow-Up Visit   Subjective  Morgan [name] is a 78 y.o. female who presents for the following: Skin Cancer Screening and Full Body Skin Exam  The patient presents for Total-Body Skin Exam (TBSE) for skin cancer screening and mole check. The patient has spots, moles and lesions to be evaluated, some may be new or changing. She has seborrheic dermatitis of the scalp and ears. She uses clobetasol  foam and T-Sal to the scalp and hydrocortisone  2.5% cream or hydrocortisone  valerate to the ears. Recheck inflamed SK of the left lateral cheek, treated with cryotherapy with residual.   The following portions of the chart were reviewed this encounter and updated as appropriate: medications, allergies, medical history  Review of Systems:  No other skin or systemic complaints except as noted in HPI or Assessment and Plan.  Objective  Well appearing patient in no apparent distress; mood and affect are within normal limits.  A full examination was performed including scalp, head, eyes, ears, nose, lips, neck, chest, axillae, abdomen, back, buttocks, bilateral upper extremities, bilateral lower extremities, hands, feet, fingers, toes, fingernails, and toenails. All findings within normal limits unless otherwise noted below.   Relevant physical exam findings are noted in the Assessment and Plan.  L nasal tip Pink scaly macule. Left lateral cheek 8.0 x 5.0 mm Thin pink papule with surrounding hypopigmentation.    R inferior clavicle, mid sternum, R upper back, mid back Erythematous stuck-on, waxy papule  Assessment & Plan   SKIN CANCER SCREENING PERFORMED TODAY.  ACTINIC DAMAGE - Chronic condition, secondary to cumulative UV/sun exposure - diffuse scaly erythematous macules with underlying dyspigmentation - Recommend daily broad spectrum sunscreen SPF 30+ to sun-exposed areas, reapply every 2 hours as needed.  - Staying in the shade or wearing long sleeves, sun glasses (UVA+UVB protection) and  wide brim hats (4-inch brim around the entire circumference of the hat) are also recommended for sun protection.  - Call for new or changing lesions.  LENTIGINES, SEBORRHEIC KERATOSES, HEMANGIOMAS - Benign normal skin lesions - Benign-appearing - Call for any changes  MELANOCYTIC NEVI - Tan-brown and/or pink-flesh-colored symmetric macules and papules - Benign appearing on exam today - Observation - Call clinic for new or changing moles - Recommend daily use of broad spectrum spf 30+ sunscreen to sun-exposed areas.   PSORIASIS Exam: Pink scaly patch at left ear canal; pink scaly papules at post thighs, hip; scaly plaques at occipital scalp 2% BSA.  Chronic and persistent condition with duration or expected duration over one year. Condition is symptomatic/ bothersome to patient. Not currently at goal.   Psoriasis is a chronic non-curable, but treatable genetic/hereditary disease that may have other systemic features affecting other organ systems such as joints (Psoriatic Arthritis). It is associated with an increased risk of inflammatory bowel disease, heart disease, non-alcoholic fatty liver disease, and depression.  Treatments include light and laser treatments; topical medications; and systemic medications including oral and injectables.  Treatment Plan: Discussed starting systemic treatment, pt prefers topical treatment Start Zoryve cream samples x 2 given to use in the ears qd.  Continue Clobetasol  Foam to aa scalp BID prn flares. Avoid applying to face, groin, and axilla. Use as directed. Long-term use can cause thinning of the skin. Continue  Continue halobetasol  cream to more severe areas once to twice daily as needed.  Continue TMC 0.1% ointment 1-2 times a day as needed dsp 30g 2rf. Avoid applying to face, groin, and axilla. Use as directed. Long-term use can cause thinning  of the skin.  Topical steroids (such as triamcinolone , fluocinolone, fluocinonide, mometasone,  clobetasol , halobetasol , betamethasone , hydrocortisone ) can cause thinning and lightening of the skin if they are used for too long in the same area. Your physician has selected the right strength medicine for your problem and area affected on the body. Please use your medication only as directed by your physician to prevent side effects.    Purpura - Chronic; persistent and recurrent.  Treatable, but not curable. - Violaceous macules and patches- scratch purpura from itching - Benign - Related to trauma, age, sun damage and/or use of blood thinners, chronic use of topical and/or oral steroids - Observe - Can use OTC arnica containing moisturizer such as Dermend Bruise Formula if desired - Call for worsening or other concerns Recommend OTC Gold Bond Rapid Relief Anti-Itch cream (pramoxine + menthol ), CeraVe Anti-itch cream or lotion (pramoxine), Sarna lotion (Original- menthol  + camphor or Sensitive- pramoxine) or Eucerin 12 hour Itch Relief lotion (menthol ) up to 3 times per day to areas on body that are itchy.  AK (ACTINIC KERATOSIS) L nasal tip Pt defers cryotherapy today  - Start 5-fluorouracil /calcipotriene cream twice a day for 3-4 days to affected areas including left nasal tip. Patient has at home, if not we will observe and recheck on follow-up. Patient provided with handout reviewing treatment course and side effects and advised to call or message us  on MyChart with any concerns.  Reviewed course of treatment and expected reaction.  Patient advised to expect inflammation and crusting and advised that erosions are possible.  Patient advised to be diligent with sun protection during and after treatment. Counseled to keep medication out of reach of children and pets.   Actinic keratoses are precancerous spots that appear secondary to cumulative UV radiation exposure/sun exposure over time. They are chronic with expected duration over 1 year. A portion of actinic keratoses will progress to  squamous cell carcinoma of the skin. It is not possible to reliably predict which spots will progress to skin cancer and so treatment is recommended to prevent development of skin cancer.  Recommend daily broad spectrum sunscreen SPF 30+ to sun-exposed areas, reapply every 2 hours as needed.  Recommend staying in the shade or wearing long sleeves, sun glasses (UVA+UVB protection) and wide brim hats (4-inch brim around the entire circumference of the hat). Call for new or changing lesions. NEOPLASM OF UNCERTAIN BEHAVIOR OF SKIN Left lateral cheek Skin / nail biopsy Type of biopsy: tangential   Informed consent: discussed and consent obtained   Patient was prepped and draped in usual sterile fashion: Area prepped with alcohol. Anesthesia: the lesion was anesthetized in a standard fashion   Anesthetic:  1% lidocaine  w/ epinephrine  1-100,000 buffered w/ 8.4% NaHCO3 Instrument used: flexible razor blade   Hemostasis achieved with: pressure, aluminum chloride and electrodesiccation   Outcome: patient tolerated procedure well   Post-procedure details: wound care instructions given   Post-procedure details comment:  Ointment and small bandage applied  Specimen 1 - Surgical pathology Differential Diagnosis: AK vs ISK r/o BCC Check Margins: No Discussed EDC vs EXC if +. INFLAMED SEBORRHEIC KERATOSIS R inferior clavicle, mid sternum, R upper back, mid back Symptomatic, irritating, patient defers treatment today. Will treat on follow-up, or patient can use hydrocortisone  2.5% cream prn itch.  Return if symptoms worsen or fail to improve. Will schedule f/u when we call pt with bx results.  IAndrea Kerns, CMA, am acting as scribe for Rexene Rattler, MD .  Documentation: I have reviewed the above documentation for accuracy and completeness, and I agree with the above.  Rexene Rattler, MD

## 2024-03-04 LAB — SURGICAL PATHOLOGY

## 2024-03-08 ENCOUNTER — Ambulatory Visit: Payer: Self-pay | Admitting: Dermatology

## 2024-03-08 ENCOUNTER — Encounter: Payer: Self-pay | Admitting: Dermatology

## 2024-03-08 NOTE — Telephone Encounter (Signed)
Left message for patient to call for biopsy results. 

## 2024-03-08 NOTE — Telephone Encounter (Signed)
-----   Message from Rexene Rattler sent at 03/08/2024 10:27 AM EDT ----- 1. Skin (M), left lateral cheek :       SQUAMOUS CELL CARCINOMA IN SITU   SCCIS skin cancer-  recommend EDC in office vs topical treatment with 5FU/VitD cream (pt has) bid x 2-3 weeks to aa cheek - please call patient  5-fluorouracil /calcipotriene cream is is a type of field treatment used to treat precancers, thin skin cancers, and areas of sun damage. Expected reaction includes irritation and mild inflammation  potentially progressing to more severe inflammation including redness, scaling, crusting and open sores/erosions.  If too much irritation occurs, ensure application of only a thin layer and decrease  frequency of use to achieve a tolerable level of inflammation. Recommend applying Vaseline ointment to open sores as needed.  Minimize sun exposure while under treatment. Recommend daily broad  spectrum sunscreen SPF 30+ to sun-exposed areas, reapply every 2 hours as needed.      ----- Message ----- From: Interface, Lab In [facility] Sent: 03/04/2024   6:02 PM EDT To: Rexene Rattler, MD

## 2024-03-08 NOTE — Telephone Encounter (Signed)
 Patient returned call to the office. She has scheduled EDC with Dr. Jackquline. She did not have 5FU/Calcipotriene Cream at home. Please recheck nose at follow up appt as well.

## 2024-03-09 DIAGNOSIS — H02834 Dermatochalasis of left upper eyelid: Secondary | ICD-10-CM | POA: Diagnosis not present

## 2024-03-09 DIAGNOSIS — H18413 Arcus senilis, bilateral: Secondary | ICD-10-CM | POA: Diagnosis not present

## 2024-03-09 DIAGNOSIS — H2511 Age-related nuclear cataract, right eye: Secondary | ICD-10-CM | POA: Diagnosis not present

## 2024-03-09 DIAGNOSIS — H25043 Posterior subcapsular polar age-related cataract, bilateral: Secondary | ICD-10-CM | POA: Diagnosis not present

## 2024-03-09 DIAGNOSIS — H2513 Age-related nuclear cataract, bilateral: Secondary | ICD-10-CM | POA: Diagnosis not present

## 2024-03-31 ENCOUNTER — Ambulatory Visit: Admitting: Dermatology

## 2024-03-31 ENCOUNTER — Encounter: Payer: Self-pay | Admitting: Dermatology

## 2024-03-31 DIAGNOSIS — W908XXA Exposure to other nonionizing radiation, initial encounter: Secondary | ICD-10-CM

## 2024-03-31 DIAGNOSIS — Z7189 Other specified counseling: Secondary | ICD-10-CM | POA: Diagnosis not present

## 2024-03-31 DIAGNOSIS — L821 Other seborrheic keratosis: Secondary | ICD-10-CM

## 2024-03-31 DIAGNOSIS — R21 Rash and other nonspecific skin eruption: Secondary | ICD-10-CM | POA: Diagnosis not present

## 2024-03-31 DIAGNOSIS — Z79899 Other long term (current) drug therapy: Secondary | ICD-10-CM

## 2024-03-31 DIAGNOSIS — L570 Actinic keratosis: Secondary | ICD-10-CM | POA: Diagnosis not present

## 2024-03-31 DIAGNOSIS — L578 Other skin changes due to chronic exposure to nonionizing radiation: Secondary | ICD-10-CM

## 2024-03-31 DIAGNOSIS — D0439 Carcinoma in situ of skin of other parts of face: Secondary | ICD-10-CM | POA: Diagnosis not present

## 2024-03-31 DIAGNOSIS — D099 Carcinoma in situ, unspecified: Secondary | ICD-10-CM

## 2024-03-31 MED ORDER — FLUOROURACIL 5 % EX CREA: TOPICAL_CREAM | Freq: Two times a day (BID) | CUTANEOUS | 2 refills | Status: AC

## 2024-03-31 NOTE — Patient Instructions (Addendum)
 Wound Care Instructions  Cleanse wound gently with soap and water once a day then pat dry with clean gauze. Apply a thin coat of Petrolatum (petroleum jelly, Vaseline) over the wound (unless you have an allergy to this). We recommend that you use a new, sterile tube of Vaseline. Do not pick or remove scabs. Do not remove the yellow or white healing tissue from the base of the wound.  Cover the wound with fresh, clean, nonstick gauze and secure with paper tape. You may use Band-Aids in place of gauze and tape if the wound is small enough, but would recommend trimming much of the tape off as there is often too much. Sometimes Band-Aids can irritate the skin.  You should call the office for your biopsy report after 1 week if you have not already been contacted.  If you experience any problems, such as abnormal amounts of bleeding, swelling, significant bruising, significant pain, or evidence of infection, please call the office immediately.  FOR ADULT SURGERY PATIENTS: If you need something for pain relief you may take 1 extra strength Tylenol  (acetaminophen ) AND 2 Ibuprofen  (200mg  each) together every 4 hours as needed for pain. (do not take these if you are allergic to them or if you have a reason you should not take them.) Typically, you may only need pain medication for 1 to 3 days.      Start 5-fluorouracil /calcipotriene cream twice a day for 5 days to affected areas including nose. Prescription sent to [facility]. Patient advised they will receive an email to purchase the medication online and have it sent to their home. Patient provided with handout reviewing treatment course and side effects and advised to call or message us  on MyChart with any concerns.  Instructions for [facility] Medications  One or more of your medications was sent to the [facility] mail order compounding pharmacy. You will receive an email from them and can purchase the medicine  through that link. It will then be mailed to your home at the address you confirmed. If for any reason you do not receive an email from them, please check your spam folder. If you still do not find the email, please let us  know. [facility] phone number is [phone].      Due to recent changes in healthcare laws, you may see results of your pathology and/or laboratory studies on MyChart before the doctors have had a chance to review them. We understand that in some cases there may be results that are confusing or concerning to you. Please understand that not all results are received at the same time and often the doctors may need to interpret multiple results in order to provide you with the best plan of care or course of treatment. Therefore, we ask that you please give us  2 business days to thoroughly review all your results before contacting the office for clarification. Should we see a critical lab result, you will be contacted sooner.   If You Need Anything After Your Visit  If you have any questions or concerns for your doctor, please call our main line at [phone] and press option 4 to reach your doctor's medical assistant. If no one answers, please leave a voicemail as directed and we will return your call as soon as possible. Messages left after 4 pm will be answered the following business day.   You may also send us  a message via MyChart. We typically respond to MyChart messages within 1-2 business  days.  For prescription refills, please ask your pharmacy to contact our office. Our fax number is [phone].  If you have an urgent issue when the clinic is closed that cannot wait until the next business day, you can page your doctor at the number below.    Please note that while we do our best to be available for urgent issues outside of office hours, we are not available 24/7.   If you have an urgent issue and are unable to reach us , you may choose to seek medical care at  your doctor's office, retail clinic, urgent care center, or emergency room.  If you have a medical emergency, please immediately call 911 or go to the emergency department.  Pager Numbers  - Dr. Hester: [phone]  - Dr. Jackquline: [phone]  - Dr. Claudene: [phone]   - Dr. Raymund: [phone]  In the event of inclement weather, please call our main line at [phone] for an update on the status of any delays or closures.  Dermatology Medication Tips: Please keep the boxes that topical medications come in in order to help keep track of the instructions about where and how to use these. Pharmacies typically print the medication instructions only on the boxes and not directly on the medication tubes.   If your medication is too expensive, please contact our office at [phone] option 4 or send us  a message through MyChart.   We are unable to tell what your co-pay for medications will be in advance as this is different depending on your insurance coverage. However, we may be able to find a substitute medication at lower cost or fill out paperwork to get insurance to cover a needed medication.   If a prior authorization is required to get your medication covered by your insurance company, please allow us  1-2 business days to complete this process.  Drug prices often vary depending on where the prescription is filled and some pharmacies may offer cheaper prices.  The website www.goodrx.com contains coupons for medications through different pharmacies. The prices here do not account for what the cost may be with help from insurance (it may be cheaper with your insurance), but the website can give you the price if you did not use any insurance.  - You can print the associated coupon and take it with your prescription to the pharmacy.  - You may also stop by our office during regular business hours and pick up a GoodRx coupon card.  - If you need your prescription sent  electronically to a different pharmacy, notify our office through [facility] or by phone at [phone] option 4.     Si Usted Necesita Algo Despus de Su Visita  Tambin puede enviarnos un mensaje a travs de Clinical cytogeneticist. Por lo general respondemos a los mensajes de MyChart en el transcurso de 1 a 2 das hbiles.  Para renovar recetas, por favor pida a su farmacia que se ponga en contacto con nuestra oficina. Randi lakes de fax es [place] [phone].  Si tiene un asunto urgente cuando la clnica est cerrada y que no puede esperar hasta el siguiente da hbil, puede llamar/localizar a su doctor(a) al nmero que aparece a continuacin.   Por favor, tenga en cuenta que aunque hacemos todo lo posible para estar disponibles para asuntos urgentes fuera del horario de [place], no estamos disponibles las 24 horas del da, los 7 [address]  [address] de la [place].   Si tiene un problema urgente y no puede comunicarse con nosotros,  puede optar por buscar atencin mdica  en el consultorio de su doctor(a), en una clnica privada, en un centro de atencin urgente o en una sala de emergencias.  Si tiene Engineer, drilling, por favor llame inmediatamente al 911 o vaya a la sala de emergencias.  Nmeros de bper  - Dr. Hester: [phone]  - Dra. Jackquline: [phone]  - Dr. Claudene: [phone]  - Dra. Kitts: [phone]  En caso de inclemencias del [place], por favor llame a nuestra lnea principal al [phone] para una actualizacin sobre el estado de cualquier retraso o cierre.  Consejos para la medicacin en dermatologa: Por favor, guarde las cajas en las que vienen los medicamentos de uso tpico para ayudarle a seguir las instrucciones sobre dnde y cmo usarlos. Las farmacias generalmente imprimen las instrucciones del medicamento slo en las cajas y no directamente en los tubos del [place].   Si su medicamento es muy caro, por favor, pngase en contacto con landry rieger llamando al  [phone] y presione la opcin 4 o envenos un mensaje a travs de Clinical cytogeneticist.   No podemos decirle cul ser su copago por los medicamentos por adelantado ya que esto es diferente dependiendo de la cobertura de su seguro. Sin embargo, es posible que podamos encontrar un medicamento sustituto a Audiological scientist un formulario para que el seguro cubra el medicamento que se considera necesario.   Si se requiere una autorizacin previa para que su compaa de seguros malta su medicamento, por favor permtanos de 1 a 2 das hbiles para completar este proceso.  Los precios de los medicamentos varan con frecuencia dependiendo del Environmental consultant de dnde se surte la receta y alguna farmacias pueden ofrecer precios ms baratos.  El sitio web www.goodrx.com tiene cupones para medicamentos de Health and safety inspector. Los precios aqu no tienen en cuenta lo que podra costar con la ayuda del seguro (puede ser ms barato con su seguro), pero el sitio web puede darle el precio si no utiliz Tourist information centre manager.  - Puede imprimir el cupn correspondiente y llevarlo con su receta a la farmacia.  - Tambin puede pasar por nuestra oficina durante el horario de atencin regular y Education officer, museum una tarjeta de cupones de GoodRx.  - Si necesita que su receta se enve electrnicamente a una farmacia diferente, informe a nuestra oficina a travs de MyChart de [place] o por telfono llamando al [phone] y presione la opcin 4.

## 2024-03-31 NOTE — Progress Notes (Signed)
 Follow-Up Visit   Subjective  Morgan [name] is a 78 y.o. female who presents for the following: bx proven SCC IS of the L lat cheek to txt with EDC today, check AK L nasal tip pt did not 5FU/Calcipotriene cream at home so she did not treat, check spots hands.  Has itchy patch on L calf and treating with halobetasol  which is helping.  Pt has h/o psoriasis.   The following portions of the chart were reviewed this encounter and updated as appropriate: medications, allergies, medical history  Review of Systems:  No other skin or systemic complaints except as noted in HPI or Assessment and Plan.  Objective  Well appearing patient in no apparent distress; mood and affect are within normal limits.   A focused examination was performed of the following areas: face  Relevant exam findings are noted in the Assessment and Plan.  L lat cheek Pink bx site  Assessment & Plan   SEBORRHEIC KERATOSIS hands - Stuck-on, waxy, flesh thin papules  - Benign-appearing - Discussed benign etiology and prognosis. - Observe - Call for any changes   PSORIASIS vs Lichen Simplex Chronicus- partially treated L lower calf Exam: Light pink scaly papules/plaque L lower calf  Chronic and persistent condition with duration or expected duration over one year. Condition is improving with treatment but not currently at goal.   Treatment Plan: Cont Halobetasol  cr qd/bid prn flares (pt has prescription at home and can call if needs refills) avoid f/g/a, Caution skin atrophy with long-term use.   Topical steroids (such as triamcinolone , fluocinolone, fluocinonide, mometasone, clobetasol , halobetasol , betamethasone , hydrocortisone ) can cause thinning and lightening of the skin if they are used for too long in the same area. Your physician has selected the right strength medicine for your problem and area affected on the body. Please use your medication only as directed by your physician to prevent side effects.    SQUAMOUS CELL CARCINOMA IN SITU (SCCIS) L lat cheek Destruction of lesion  Destruction method: electrodesiccation and curettage   Informed consent: discussed and consent obtained   Timeout:  patient name, date of birth, surgical site, and procedure verified Anesthesia: the lesion was anesthetized in a standard fashion   Anesthetic:  1% lidocaine  w/ epinephrine  1-100,000 buffered w/ 8.4% NaHCO3 Curettage performed in three different directions: Yes   Electrodesiccation performed over the curetted area: Yes   Final wound size (cm):  1 Hemostasis achieved with:  pressure, aluminum chloride and electrodesiccation Outcome: patient tolerated procedure well with no complications   Post-procedure details: wound care instructions given   Post-procedure details comment:  Mupirocin  ointment and bandage applied  Bx proven SCC IS  ACTINIC DAMAGE WITH PRECANCEROUS ACTINIC KERATOSES Counseling for Topical Chemotherapy Management: Patient exhibits: - Severe, confluent actinic changes with pre-cancerous actinic keratoses that is secondary to cumulative UV radiation exposure over time - Condition that is severe; chronic, not at goal. - diffuse scaly erythematous macules and papules with underlying dyspigmentation - Discussed Prescription Field Treatment topical Chemotherapy for Severe, Chronic Confluent Actinic Changes with Pre-Cancerous Actinic Keratoses Field treatment involves treatment of an entire area of skin that has confluent Actinic Changes (Sun/ Ultraviolet light damage) and PreCancerous Actinic Keratoses by method of PhotoDynamic Therapy (PDT) and/or prescription Topical Chemotherapy agents such as 5-fluorouracil , 5-fluorouracil /calcipotriene, and/or imiquimod.  The purpose is to decrease the number of clinically evident and subclinical PreCancerous lesions to prevent progression to development of skin cancer by chemically destroying early precancer changes that may or may not  be visible.   It has been shown to reduce the risk of developing skin cancer in the treated area. As a result of treatment, redness, scaling, crusting, and open sores may occur during treatment course. One or more than one of these methods may be used and may have to be used several times to control, suppress and eliminate the PreCancerous changes. Discussed treatment course, expected reaction, and possible side effects. - Recommend daily broad spectrum sunscreen SPF 30+ to sun-exposed areas, reapply every 2 hours as needed.  - Staying in the shade or wearing long sleeves, sun glasses (UVA+UVB protection) and wide brim hats (4-inch brim around the entire circumference of the hat) are also recommended. - Call for new or changing lesions.  - Start 5-fluorouracil /calcipotriene cream twice a day for 5 days to affected areas including nose. Prescription sent to [facility]. Patient advised they will receive an email to purchase the medication online and have it sent to their home. Patient provided with handout reviewing treatment course and side effects and advised to call or message us  on MyChart with any concerns.  Reviewed course of treatment and expected reaction.  Patient advised to expect inflammation and crusting and advised that erosions are possible.  Patient advised to be diligent with sun protection during and after treatment. Counseled to keep medication out of reach of children and pets.   Return in about 6 months (around 09/28/2024) for Recheck SCC IS, AKs nose, Psoriasis.  I, Grayce Saunas, RMA, am acting as scribe for Rexene Rattler, MD .   Documentation: I have reviewed the above documentation for accuracy and completeness, and I agree with the above.  Rexene Rattler, MD

## 2024-04-06 ENCOUNTER — Ambulatory Visit
Admission: RE | Admit: 2024-04-06 | Discharge: 2024-04-06 | Disposition: A | Discharge status: Home / Self Care | Source: Ambulatory Visit | Attending: Family Medicine | Admitting: Family Medicine

## 2024-04-06 DIAGNOSIS — E042 Nontoxic multinodular goiter: Secondary | ICD-10-CM | POA: Diagnosis not present

## 2024-04-11 ENCOUNTER — Ambulatory Visit: Payer: Self-pay | Admitting: Family Medicine

## 2024-04-21 ENCOUNTER — Other Ambulatory Visit: Payer: Self-pay | Admitting: Dermatology

## 2024-04-21 DIAGNOSIS — L219 Seborrheic dermatitis, unspecified: Secondary | ICD-10-CM

## 2024-04-22 ENCOUNTER — Telehealth: Payer: Self-pay

## 2024-04-22 DIAGNOSIS — L219 Seborrheic dermatitis, unspecified: Secondary | ICD-10-CM

## 2024-04-22 MED ORDER — CLOBETASOL PROPIONATE 0.05 % EX FOAM: CUTANEOUS | 5 refills | Status: AC

## 2024-04-22 NOTE — Telephone Encounter (Signed)
 Clobetasol  Foam PA denied. She has been using it and prescribed for Seborrheic Dermatitis in the past.   Spoke with patient and she is going to order RX through [facility] with Good RX Coupon. aw

## 2024-04-23 ENCOUNTER — Other Ambulatory Visit: Payer: Self-pay | Admitting: Dermatology

## 2024-06-25 ENCOUNTER — Ambulatory Visit: Payer: Self-pay

## 2024-06-25 NOTE — Telephone Encounter (Signed)
 FYI Only or Action Required?: FYI only for provider: appointment scheduled on 06/28/2024 at 3 PM.  Patient was last seen in primary care on 02/24/2024 by Randeen Laine LABOR, MD.  Called Nurse Triage reporting Neck Pain.  Symptoms began 2 weeks ago.  Interventions attempted: Rest, hydration, or home remedies.  Symptoms are: unchanged.  Triage Disposition: See PCP When Office is Open (Within 3 Days)  Patient/caregiver understands and will follow disposition?: Yes  Copied from CRM #[id number]. Topic: Clinical - Red Word Triage >> Jun 25, 2024  9:52 AM Rosina BIRCH wrote: Red Word that prompted transfer to Nurse Triage: Burning pain in neck and  it is running down her right arm Reason for Disposition  [1] MODERATE neck pain (e.g., interferes with normal activities) AND [2] present > 3 days  Answer Assessment - Initial Assessment Questions Patient reports right sided neck pain that has been going on for two weeks. Reports have an episode yesterday of pain going from neck to right upper arm. Patient states pain didn't last and hasn't occurred since then. No CP or SOB. Patient reports the pain to the arm hasn't happened again. Did schedule patient to be seen on 06/28/2024 at 3 PM.   1. ONSET: When did the pain begin?      Started two weeks ago 2. LOCATION: Where does it hurt?      Middle of neck to the right side 3. PATTERN Does the pain come and go, or has it been constant since it started?      Come and goes 4. SEVERITY: How bad is the pain?  (Scale 0-10; or none or slight stiffness, mild, moderate, severe)     5 out of 10 5. RADIATION: Does the pain go anywhere else, shoot into your arms?     No current radiation-did have an episode of pain going into her right arm right before her elbow 6. CORD SYMPTOMS: Any weakness or numbness of the arms or legs?     no 7. CAUSE: What do you think is causing the neck pain?     Concerned for possible arthritis 8. NECK OVERUSE: Any recent  activities that involved turning or twisting the neck?     no 9. OTHER SYMPTOMS: Do you have any other symptoms? (e.g., headache, fever, chest pain, difficulty breathing, neck swelling)     no  Protocols used: Neck Pain or Stiffness-A-AH

## 2024-06-25 NOTE — Telephone Encounter (Signed)
 Will see patient then Agree with ER and UC precautions

## 2024-06-28 ENCOUNTER — Ambulatory Visit: Payer: Self-pay | Admitting: *Deleted

## 2024-06-28 ENCOUNTER — Ambulatory Visit: Admitting: Family Medicine

## 2024-06-28 NOTE — Telephone Encounter (Signed)
 Copied from CRM [id number]. Topic: Appointments - Appointment Cancel/Reschedule >> Jun 28, 2024  8:18 AM Robinson H wrote: Patient calling to reschedule appointment that was scheduled by nurse triage. Patient has called to reschedule her appointment fir today- due to weather- she would like to do virtual. Call to office- due to physical nature of complaints- patient will need to be seen- patient has been rescheduled for tomorrow. Patient does have some questions about lab results and call will be transferred to clinical staff. Thank you Macario for assistance

## 2024-06-29 ENCOUNTER — Ambulatory Visit
Admission: RE | Admit: 2024-06-29 | Discharge: 2024-06-29 | Disposition: A | Source: Ambulatory Visit | Attending: Family Medicine | Admitting: Family Medicine

## 2024-06-29 ENCOUNTER — Encounter: Payer: Self-pay | Admitting: Family Medicine

## 2024-06-29 ENCOUNTER — Ambulatory Visit: Admitting: Family Medicine

## 2024-06-29 ENCOUNTER — Ambulatory Visit: Payer: Self-pay | Admitting: Family Medicine

## 2024-06-29 VITALS — BP 106/62 | HR 51 | Temp 98.2°F | Ht 63.75 in | Wt 144.0 lb

## 2024-06-29 DIAGNOSIS — M4800 Spinal stenosis, site unspecified: Secondary | ICD-10-CM | POA: Diagnosis not present

## 2024-06-29 DIAGNOSIS — M542 Cervicalgia: Secondary | ICD-10-CM

## 2024-06-29 DIAGNOSIS — M4802 Spinal stenosis, cervical region: Secondary | ICD-10-CM | POA: Diagnosis not present

## 2024-06-29 DIAGNOSIS — M4722 Other spondylosis with radiculopathy, cervical region: Secondary | ICD-10-CM | POA: Diagnosis not present

## 2024-06-29 MED ORDER — PREDNISONE 10 MG PO TABS: ORAL_TABLET | ORAL | 0 refills | Status: AC

## 2024-06-29 NOTE — Assessment & Plan Note (Signed)
 Lumbar Continues neuro surg care   Now some issues in CS

## 2024-06-29 NOTE — Assessment & Plan Note (Addendum)
 Has had neck pain in past/not on this side One incidence of fleeting radiation to upper arm (when pulling on husband during his care) Known spondylosis seen on CT (incidental)  Has appointment with Dr Colon in February   Reassuring exam- more pain with extension than flexion  More pain with left rotation than right   Xray ordered - this re affirmed DDD with foraminal encroachment at C5-6 and 6-7  Suspect pinched nerve/radiculopathy  Encouraged continued heat/stretching  Prednisone  30 mg taper prescribed (aware of possible side effects)  May benefit from PT in future  I personally spent a total of 31 minutes in the care of the patient today including preparing to see the patient, getting/reviewing separately obtained history, performing a medically appropriate exam/evaluation, counseling and educating, placing orders, documenting clinical information in the EHR, and communicating results.

## 2024-06-29 NOTE — Patient Instructions (Addendum)
 Let's get an xray of your neck / cervical spine  We will reach out with results   Take the prednisone  as directed and see if it helps Continue heat as needed Gentle stretching also   Physical therapy may help in future as well     For now do not cancel appointment with Dr Colon

## 2024-06-29 NOTE — Progress Notes (Signed)
 Subjective:    Patient ID: Morgan [name], female    DOB: 08-11-1945, 78 y.o.   MRN: [id number]  HPI  Wt Readings from Last 3 Encounters:  06/29/24 144 lb (65.3 kg)  02/24/24 141 lb 2 oz (64 kg)  01/22/24 139 lb (63 kg)   24.91 kg/m  Vitals:   06/29/24 1455  BP: 106/62  Pulse: (!) 51  Temp: 98.2 F (36.8 C)  SpO2: 99%     Pt presents with c/o  Neck pain on Right    Pain starts in afternoon  Burning pain at base of neck and radiates up  On the right side   Pain comes and goes  Not there all the time Good and bad days   Worsened by flexing neck  Can occur at night but not always   As she ages- grip is not as strong but this is symmetric     Has to help dress and bathe her husband  Has sharp pain in right neck and arm when pulling on something  That was the only time it went to her arm (upper arm)     Tried  Heat Ibuprofen   Wonders if prednisone  would help (tolerates it well)  Has not tried ice  Has never been in PT for neck     Helped a bit  Not as severe when not bathing her husband / also trouble pushing heavy wheelchair     History of Deg changes in neck facet/joint and also DDD  Has been to [facility] and seen Dr Leeann for lumbar issues  Now Dr Colon -cannot get appointment until feb for neck    CTA of neck/head in 2023 noted cervical spondylosis worst at C5-6 and C 6-7     Pt had questions about labs in past Sees GI for GI issues ([facility])   Lab Results  Component Value Date   ALT 10 01/27/2024   AST 18 01/27/2024   ALKPHOS 121 (H) 01/27/2024   BILITOT 0.4 01/27/2024   Alk phos tends to be slightly high     Lab Results  Component Value Date   CALCIUM 9.0 01/27/2024   CAION 1.24 11/24/2021   PHOS 3.8 01/29/2008   Lab Results  Component Value Date   TSH 0.86 01/27/2024   Alk phos 09/23/33 at [facility] was 116  Ca was normal at 9.4   Imaging DG Cervical Spine Complete Result Date:  06/29/2024 CLINICAL DATA:  Right-sided neck pain, radiculopathy EXAM: CERVICAL SPINE - COMPLETE 4+ VIEW COMPARISON:  None Available. FINDINGS: Frontal, bilateral oblique, and lateral views of the cervical spine are obtained. There is degenerative retrolisthesis of C5 relative to C4 and C6. Otherwise alignment is anatomic. There are no acute displaced fractures. There is severe spondylosis at C5-6 and C6-7. There is moderate diffuse facet hypertrophy. Moderate symmetrical neural foraminal narrowing is seen at C5-6 and C6-7. There is mild symmetrical neural foraminal encroachment at C4-5. Prevertebral soft tissues are unremarkable. Lung apices are clear. IMPRESSION: 1. No acute cervical spine fracture. 2. Multilevel cervical degenerative changes, with neural foraminal encroachment greatest at C5-6 and C6-7. Electronically Signed   By: Ozell Daring M.D.   On: 06/29/2024 16:31      Patient Active Problem List   Diagnosis Date Noted   Elevated random blood glucose level 02/24/2024   Urinary frequency 02/24/2024   Popliteal cyst, left 08/19/2023   Posterior knee pain, left 08/18/2023   Osteoarthritis of both knees 08/18/2023  Estrogen deficiency 10/22/2022   Pre-syncope 11/30/2021   Insomnia 11/30/2021   Bradycardia 11/30/2021   Fasting hypoglycemia 10/03/2021   S/P laparoscopic cholecystectomy 08/22/2021   Current use of proton pump inhibitor 07/04/2020   Idiopathic small fiber sensory neuropathy 04/12/2020   Belching 02/09/2020   Vitamin D  deficiency 10/05/2019   Neck pain on right side 03/02/2019   Colon cancer screening 02/09/2019   Paresthesia of foot, bilateral 08/12/2016   Encounter for Medicare annual wellness exam 04/13/2014   Allergic rhinitis 04/13/2014   Routine general medical examination at a health care facility 04/07/2014   Multiple thyroid  nodules 01/24/2014   History of small bowel obstruction 01/20/2014   DDD (degenerative disc disease), cervical    DDD (degenerative  disc disease), lumbar    Spinal stenosis    TINNITUS 06/08/2010   Hypothyroidism 04/18/2010   GERD 05/03/2008   DIVERTICULOSIS OF COLON 04/26/2008   HEPATITIS, HX OF 04/26/2008   History of colonic polyps 04/26/2008   HEMORRHOIDS, INTERNAL 01/11/2008   DERMATITIS, SEBORRHEIC NEC 01/28/2007   Hyperlipidemia 01/19/2007   IBS 01/19/2007   OVERACTIVE BLADDER 01/19/2007   POSTMENOPAUSAL STATUS 01/19/2007   ECZEMA 01/19/2007   Osteoarthritis 01/19/2007   Past Medical History:  Diagnosis Date   Actinic keratosis    Allergy    Bradycardia    Collagen vascular disease    Connective tissue disease    COVID-19    DDD (degenerative disc disease), cervical    DDD (degenerative disc disease), lumbar    Endometrial polyp    not resolved with D&C (following)   GERD (gastroesophageal reflux disease)    gas/belching issues-was on protonix for short time-not since 6 months   HLD (hyperlipidemia)    Mixed connective tissue disease diagnosed 5 years ago   no problems since diagnosis   Multiple thyroid  nodules 01/24/2014   Obesity    Osteoarthritis of cervical spine    Renal calculi    Spinal stenosis    Squamous cell carcinoma in situ (SCCIS) 02/26/2024   left lateral cheek, EDC 03/31/24   Past Surgical History:  Procedure Laterality Date   APPENDECTOMY     CHOLECYSTECTOMY N/A 08/22/2021   Procedure: LAPAROSCOPIC CHOLECYSTECTOMY;  Surgeon: Vernetta Berg, MD;  Location: [facility];  Service: General;  Laterality: N/A;   COLONOSCOPY     DILATION AND CURETTAGE OF UTERUS     fibroids   DILATION AND CURETTAGE OF UTERUS  01/2005   endometrial polyp   ENDOMETRIAL BIOPSY  2008   fibroid   ENDOSCOPIC RETROGRADE CHOLANGIOPANCREATOGRAPHY (ERCP) WITH PROPOFOL  N/A 01/21/2022   Procedure: ENDOSCOPIC RETROGRADE CHOLANGIOPANCREATOGRAPHY (ERCP) WITH PROPOFOL ;  Surgeon: Avram Lupita BRAVO, MD;  Location: [facility];  Service: Gastroenterology;  Laterality: N/A;   HAND SURGERY      carpal tunnel release bilateral Dr. Leonor   hysteroscopic resection      polyps of PMB   KNEE ARTHROSCOPY WITH MEDIAL MENISECTOMY Left 11/21/2023   Procedure: ARTHROSCOPY,LEFT KNEE, WITH MEDIAL MENISCECTOMY;  Surgeon: Sharl Selinda Dover, MD;  Location: [facility];  Service: Orthopedics;  Laterality: Left;   KNEE ARTHROSCOPY WITH SUBCHONDROPLASTY Left 11/21/2023   Procedure: ARTHROSCOPY, LEFT KNEE, WITH SUBCHONDROPLASTY;  Surgeon: Sharl Selinda Dover, MD;  Location: [facility];  Service: Orthopedics;  Laterality: Left;   REMOVAL OF STONES  01/21/2022   Procedure: REMOVAL OF STONES;  Surgeon: Avram Lupita BRAVO, MD;  Location: [facility];  Service: Gastroenterology;;   ANNETT  01/21/2022   Procedure: SPHINCTEROTOMY;  Surgeon:  Avram Lupita BRAVO, MD;  Location: [facility];  Service: Gastroenterology;;   TONSILLECTOMY     TUBAL LIGATION     1973   UPPER GASTROINTESTINAL ENDOSCOPY     Social History   Tobacco Use   Smoking status: Former    Current packs/day: 0.00    Types: Cigarettes    Quit date: 07/22/1965    Years since quitting: 58.9   Smokeless tobacco: Never   Tobacco comments:    Smoked for 2 years as a teen  [employer]   Vaping status: Never Used  Substance Use Topics   Alcohol use: No   Drug use: No   Family History  Problem Relation Age of Onset   Dementia Mother    Coronary artery disease Father    Heart attack Father        deceased at 12   Diabetes Maternal Grandmother    Goiter Maternal Grandmother    Cancer Grandchild        rare type (dying at age 65)   Diabetes Cousin        multiple   Colon cancer Neg Hx    Esophageal cancer Neg Hx    Stomach cancer Neg Hx    Rectal cancer Neg Hx    Colon polyps Neg Hx    Allergies  Allergen Reactions   Codeine Nausea And Vomiting   Hydrocodone-Acetaminophen      REACTION: reaction not known   Penicillins Hives   Current Outpatient Medications on File Prior to Visit  Medication  Sig Dispense Refill   Cholecalciferol (VITAMIN D3) 25 MCG (1000 UT) CAPS Take 2,000 Units by mouth daily in the afternoon.     clobetasol  (OLUX ) 0.05 % topical foam APPLY TO AFFECT AREAS (RASH) TWICE DAILY AS NEEDED. AVOID APPLYING TO FACE, GROIN, AND AXILLA. USE AS DIRECTED. LONG-TERM USE CAN CAUSE THINNING OF THE SKIN. 100 g 5   Cyanocobalamin  (VITAMIN B12) 1000 MCG TBCR Take 1,000 mcg by mouth every Monday, Wednesday, and Friday.     fluorouracil  (EFUDEX ) 5 % cream Apply topically 2 (two) times daily. Apply to nose bid for 5 days 30 g 2   halobetasol  (ULTRAVATE ) 0.05 % cream APPLY TO AFFECTED AREA TWICE DAILY PRN. AVOID APPLYING TO FACE, GROIN, AND AXILLA. USE AS DIRECTED. LONG-TERM USE CAN CAUSE THINNING OF THE SKIN. 50 g 0   ibuprofen  (ADVIL ) 200 MG tablet Take 200 mg by mouth every 6 (six) hours as needed.     omeprazole  (PRILOSEC) 40 MG capsule Take 40 mg by mouth daily.     polyethylene glycol powder (GLYCOLAX /MIRALAX ) 17 GM/SCOOP powder Take 17 g by mouth as needed for mild constipation or moderate constipation.     Ruxolitinib Phosphate  (OPZELURA ) 1.5 % CREA Apply to affected skin qd-bid 60 g 2   tacrolimus  (PROTOPIC ) 0.1 % ointment Apply topically 2 (two) times daily. To arms 60 g 1   triamcinolone  ointment (KENALOG ) 0.1 % Apply to affected areas rash once to twice daily as needed. Avoid applying to face, groin, and axilla. Use as directed. Long-term use can cause thinning of the skin. 30 g 2   valACYclovir  (VALTREX ) 1000 MG tablet TAKE TWO TABS BY MOUTH AT FIRST ONSET OF SYMPTOMS OF COLD SORES, THEN TAKE TWO TABLETS BY MOUTH 12 HOURS LATER 30 tablet 10   No current facility-administered medications on file prior to visit.    Review of Systems  Constitutional:  Negative for activity change, appetite change, fatigue, fever and unexpected weight change.  HENT:  Negative for congestion, ear pain, rhinorrhea, sinus pressure and sore throat.   Eyes:  Negative for pain, redness and visual  disturbance.  Respiratory:  Negative for cough, shortness of breath and wheezing.   Cardiovascular:  Negative for chest pain and palpitations.  Gastrointestinal:  Negative for abdominal pain, blood in stool, constipation and diarrhea.       Continues ongoing IBS issues   Endocrine: Negative for polydipsia and polyuria.  Genitourinary:  Negative for dysuria, frequency and urgency.  Musculoskeletal:  Positive for neck pain. Negative for arthralgias, back pain, myalgias and neck stiffness.  Skin:  Negative for pallor and rash.  Allergic/Immunologic: Negative for environmental allergies.  Neurological:  Negative for dizziness, syncope and headaches.  Hematological:  Negative for adenopathy. Does not bruise/bleed easily.  Psychiatric/Behavioral:  Negative for decreased concentration and dysphoric mood. The patient is not nervous/anxious.        Objective:   Physical Exam Constitutional:      General: She is not in acute distress.    Appearance: Normal appearance. She is normal weight. She is not ill-appearing or diaphoretic.  Eyes:     Conjunctiva/sclera: Conjunctivae normal.     Pupils: Pupils are equal, round, and reactive to light.  Cardiovascular:     Rate and Rhythm: Normal rate and regular rhythm.  Pulmonary:     Effort: Pulmonary effort is normal. No respiratory distress.     Breath sounds: Normal breath sounds.  Musculoskeletal:     Cervical back: Tenderness and crepitus present. No swelling, edema, deformity, erythema, signs of trauma, rigidity or torticollis. Pain with movement present. Decreased range of motion.     Comments: CS- mild crepitus  Pain with full rom in all directions/worse with extension (not full) and left rotation/tilt  Mild tenderness of Low CS  No neuro deficits   Skin:    General: Skin is warm and dry.     Findings: No erythema or rash.  Neurological:     Mental Status: She is alert.     Cranial Nerves: No cranial nerve deficit.     Sensory: No  sensory deficit.     Motor: No weakness.     Coordination: Coordination normal.     Gait: Gait normal.     Deep Tendon Reflexes: Reflexes normal.  Psychiatric:        Mood and Affect: Mood normal.           Assessment & Plan:   Problem List Items Addressed This Visit       Other   Spinal stenosis   Lumbar Continues neuro surg care   Now some issues in CS      Neck pain on right side - Primary   Has had neck pain in past/not on this side One incidence of fleeting radiation to upper arm (when pulling on husband during his care) Known spondylosis seen on CT (incidental)  Has appointment with Dr Colon in February   Reassuring exam- more pain with extension than flexion  More pain with left rotation than right   Xray ordered - this re affirmed DDD with foraminal encroachment at C5-6 and 6-7  Suspect pinched nerve/radiculopathy  Encouraged continued heat/stretching  Prednisone  30 mg taper prescribed (aware of possible side effects)  May benefit from PT in future       Relevant Orders   DG Cervical Spine Complete (Completed)

## 2024-10-05 ENCOUNTER — Ambulatory Visit: Admitting: Dermatology

## 2025-01-26 ENCOUNTER — Ambulatory Visit
